# Patient Record
Sex: Female | Born: 1935
Health system: Southern US, Community
[De-identification: ages and names within clinical notes are randomized; demographics above are authoritative.]

## PROBLEM LIST (undated history)

## (undated) DIAGNOSIS — I2510 Atherosclerotic heart disease of native coronary artery without angina pectoris: Secondary | ICD-10-CM

## (undated) DIAGNOSIS — I10 Essential (primary) hypertension: Secondary | ICD-10-CM

## (undated) DIAGNOSIS — C801 Malignant (primary) neoplasm, unspecified: Secondary | ICD-10-CM

## (undated) DIAGNOSIS — N189 Chronic kidney disease, unspecified: Secondary | ICD-10-CM

## (undated) DIAGNOSIS — J449 Chronic obstructive pulmonary disease, unspecified: Secondary | ICD-10-CM

## (undated) DIAGNOSIS — G459 Transient cerebral ischemic attack, unspecified: Secondary | ICD-10-CM

## (undated) DIAGNOSIS — R0602 Shortness of breath: Secondary | ICD-10-CM

## (undated) DIAGNOSIS — I82409 Acute embolism and thrombosis of unspecified deep veins of unspecified lower extremity: Secondary | ICD-10-CM

## (undated) DIAGNOSIS — I739 Peripheral vascular disease, unspecified: Secondary | ICD-10-CM

## (undated) DIAGNOSIS — E785 Hyperlipidemia, unspecified: Secondary | ICD-10-CM

## (undated) DIAGNOSIS — J189 Pneumonia, unspecified organism: Secondary | ICD-10-CM

## (undated) DIAGNOSIS — D649 Anemia, unspecified: Secondary | ICD-10-CM

## (undated) DIAGNOSIS — M1990 Unspecified osteoarthritis, unspecified site: Secondary | ICD-10-CM

## (undated) DIAGNOSIS — E1151 Type 2 diabetes mellitus with diabetic peripheral angiopathy without gangrene: Secondary | ICD-10-CM

## (undated) DIAGNOSIS — F32A Depression, unspecified: Secondary | ICD-10-CM

## (undated) DIAGNOSIS — R0902 Hypoxemia: Secondary | ICD-10-CM

## (undated) DIAGNOSIS — H269 Unspecified cataract: Secondary | ICD-10-CM

## (undated) DIAGNOSIS — I509 Heart failure, unspecified: Secondary | ICD-10-CM

## (undated) DIAGNOSIS — K219 Gastro-esophageal reflux disease without esophagitis: Secondary | ICD-10-CM

## (undated) DIAGNOSIS — F329 Major depressive disorder, single episode, unspecified: Secondary | ICD-10-CM

## (undated) HISTORY — PX: BREAST SURGERY: SHX581

## (undated) HISTORY — PX: CARPAL TUNNEL RELEASE: SHX101

## (undated) HISTORY — DX: Essential (primary) hypertension: I10

## (undated) HISTORY — DX: Acute embolism and thrombosis of unspecified deep veins of unspecified lower extremity: I82.409

## (undated) HISTORY — PX: SPINE SURGERY: SHX786

## (undated) HISTORY — DX: Atherosclerotic heart disease of native coronary artery without angina pectoris: I25.10

## (undated) HISTORY — DX: Chronic obstructive pulmonary disease, unspecified: J44.9

## (undated) HISTORY — DX: Chronic kidney disease, unspecified: N18.9

## (undated) HISTORY — DX: Unspecified cataract: H26.9

## (undated) HISTORY — DX: Heart failure, unspecified: I50.9

## (undated) HISTORY — DX: Hyperlipidemia, unspecified: E78.5

## (undated) HISTORY — PX: CORONARY ARTERY BYPASS GRAFT: SHX141

## (undated) HISTORY — DX: Type 2 diabetes mellitus with diabetic peripheral angiopathy without gangrene: E11.51

## (undated) HISTORY — DX: Malignant (primary) neoplasm, unspecified: C80.1

## (undated) HISTORY — DX: Transient cerebral ischemic attack, unspecified: G45.9

## (undated) HISTORY — PX: EYE SURGERY: SHX253

---

## 1998-02-09 ENCOUNTER — Ambulatory Visit (HOSPITAL_COMMUNITY): Admission: RE | Admit: 1998-02-09 | Discharge: 1998-02-09 | Payer: Self-pay | Admitting: Family Medicine

## 1999-01-17 ENCOUNTER — Encounter: Payer: Self-pay | Admitting: Internal Medicine

## 1999-01-18 ENCOUNTER — Inpatient Hospital Stay (HOSPITAL_COMMUNITY): Admission: EM | Admit: 1999-01-18 | Discharge: 1999-01-21 | Payer: Self-pay | Admitting: Emergency Medicine

## 1999-01-18 ENCOUNTER — Encounter: Payer: Self-pay | Admitting: Family Medicine

## 1999-01-20 ENCOUNTER — Encounter: Payer: Self-pay | Admitting: Internal Medicine

## 1999-02-01 ENCOUNTER — Encounter: Admission: RE | Admit: 1999-02-01 | Discharge: 1999-05-02 | Payer: Self-pay | Admitting: Family Medicine

## 1999-02-16 ENCOUNTER — Inpatient Hospital Stay (HOSPITAL_COMMUNITY): Admission: RE | Admit: 1999-02-16 | Discharge: 1999-02-17 | Payer: Self-pay | Admitting: Thoracic Surgery

## 1999-02-16 ENCOUNTER — Encounter: Payer: Self-pay | Admitting: Vascular Surgery

## 1999-03-03 ENCOUNTER — Encounter: Payer: Self-pay | Admitting: Thoracic Surgery

## 1999-03-07 ENCOUNTER — Inpatient Hospital Stay (HOSPITAL_COMMUNITY): Admission: RE | Admit: 1999-03-07 | Discharge: 1999-03-14 | Payer: Self-pay | Admitting: Thoracic Surgery

## 2000-04-13 ENCOUNTER — Ambulatory Visit (HOSPITAL_COMMUNITY): Admission: RE | Admit: 2000-04-13 | Discharge: 2000-04-13 | Payer: Self-pay | Admitting: Cardiology

## 2000-06-04 ENCOUNTER — Encounter: Payer: Self-pay | Admitting: Cardiothoracic Surgery

## 2000-06-08 ENCOUNTER — Encounter: Payer: Self-pay | Admitting: Cardiothoracic Surgery

## 2000-06-08 ENCOUNTER — Inpatient Hospital Stay (HOSPITAL_COMMUNITY): Admission: RE | Admit: 2000-06-08 | Discharge: 2000-06-17 | Payer: Self-pay | Admitting: Cardiothoracic Surgery

## 2000-06-09 ENCOUNTER — Encounter: Payer: Self-pay | Admitting: Cardiothoracic Surgery

## 2000-06-10 ENCOUNTER — Encounter: Payer: Self-pay | Admitting: Thoracic Surgery (Cardiothoracic Vascular Surgery)

## 2000-07-17 ENCOUNTER — Encounter (HOSPITAL_COMMUNITY): Admission: RE | Admit: 2000-07-17 | Discharge: 2000-10-15 | Payer: Self-pay | Admitting: Cardiology

## 2000-08-20 ENCOUNTER — Encounter: Payer: Self-pay | Admitting: Family Medicine

## 2000-08-20 ENCOUNTER — Encounter: Admission: RE | Admit: 2000-08-20 | Discharge: 2000-08-20 | Payer: Self-pay | Admitting: Family Medicine

## 2000-12-25 ENCOUNTER — Encounter: Payer: Self-pay | Admitting: Orthopedic Surgery

## 2000-12-25 ENCOUNTER — Encounter: Admission: RE | Admit: 2000-12-25 | Discharge: 2000-12-25 | Payer: Self-pay | Admitting: Orthopedic Surgery

## 2000-12-26 ENCOUNTER — Ambulatory Visit (HOSPITAL_BASED_OUTPATIENT_CLINIC_OR_DEPARTMENT_OTHER): Admission: RE | Admit: 2000-12-26 | Discharge: 2000-12-26 | Payer: Self-pay | Admitting: Orthopedic Surgery

## 2001-01-10 ENCOUNTER — Encounter: Admission: RE | Admit: 2001-01-10 | Discharge: 2001-04-10 | Payer: Self-pay | Admitting: Orthopedic Surgery

## 2001-05-30 ENCOUNTER — Encounter: Admission: RE | Admit: 2001-05-30 | Discharge: 2001-05-30 | Payer: Self-pay | Admitting: Family Medicine

## 2001-05-30 ENCOUNTER — Encounter: Payer: Self-pay | Admitting: Family Medicine

## 2001-07-19 ENCOUNTER — Encounter: Payer: Self-pay | Admitting: Family Medicine

## 2001-07-19 ENCOUNTER — Encounter: Admission: RE | Admit: 2001-07-19 | Discharge: 2001-07-19 | Payer: Self-pay | Admitting: Family Medicine

## 2001-07-24 ENCOUNTER — Encounter: Payer: Self-pay | Admitting: Family Medicine

## 2001-07-24 ENCOUNTER — Encounter: Admission: RE | Admit: 2001-07-24 | Discharge: 2001-07-24 | Payer: Self-pay | Admitting: Family Medicine

## 2001-08-01 ENCOUNTER — Encounter: Payer: Self-pay | Admitting: General Surgery

## 2001-08-05 ENCOUNTER — Ambulatory Visit (HOSPITAL_COMMUNITY): Admission: RE | Admit: 2001-08-05 | Discharge: 2001-08-05 | Payer: Self-pay | Admitting: General Surgery

## 2001-08-05 ENCOUNTER — Encounter (INDEPENDENT_AMBULATORY_CARE_PROVIDER_SITE_OTHER): Payer: Self-pay | Admitting: *Deleted

## 2001-08-05 ENCOUNTER — Encounter: Admission: RE | Admit: 2001-08-05 | Discharge: 2001-08-05 | Payer: Self-pay | Admitting: General Surgery

## 2001-08-05 ENCOUNTER — Encounter: Payer: Self-pay | Admitting: General Surgery

## 2001-08-19 ENCOUNTER — Ambulatory Visit (HOSPITAL_BASED_OUTPATIENT_CLINIC_OR_DEPARTMENT_OTHER): Admission: RE | Admit: 2001-08-19 | Discharge: 2001-08-19 | Payer: Self-pay | Admitting: General Surgery

## 2001-08-19 ENCOUNTER — Encounter (INDEPENDENT_AMBULATORY_CARE_PROVIDER_SITE_OTHER): Payer: Self-pay | Admitting: *Deleted

## 2001-08-28 ENCOUNTER — Ambulatory Visit: Admission: RE | Admit: 2001-08-28 | Discharge: 2001-11-26 | Payer: Self-pay | Admitting: Radiation Oncology

## 2001-09-11 ENCOUNTER — Encounter: Admission: RE | Admit: 2001-09-11 | Discharge: 2001-09-11 | Payer: Self-pay | Admitting: General Surgery

## 2001-09-11 ENCOUNTER — Encounter: Payer: Self-pay | Admitting: General Surgery

## 2001-11-27 ENCOUNTER — Ambulatory Visit: Admission: RE | Admit: 2001-11-27 | Discharge: 2002-02-25 | Payer: Self-pay | Admitting: Radiation Oncology

## 2002-07-23 ENCOUNTER — Encounter: Payer: Self-pay | Admitting: General Surgery

## 2002-07-23 ENCOUNTER — Encounter: Admission: RE | Admit: 2002-07-23 | Discharge: 2002-07-23 | Payer: Self-pay | Admitting: General Surgery

## 2002-11-05 ENCOUNTER — Encounter: Admission: RE | Admit: 2002-11-05 | Discharge: 2002-11-05 | Payer: Self-pay | Admitting: Family Medicine

## 2002-11-05 ENCOUNTER — Encounter: Payer: Self-pay | Admitting: Family Medicine

## 2003-04-14 ENCOUNTER — Encounter: Payer: Self-pay | Admitting: General Surgery

## 2003-04-14 ENCOUNTER — Encounter: Admission: RE | Admit: 2003-04-14 | Discharge: 2003-04-14 | Payer: Self-pay | Admitting: General Surgery

## 2003-10-13 ENCOUNTER — Encounter: Admission: RE | Admit: 2003-10-13 | Discharge: 2003-10-13 | Payer: Self-pay | Admitting: General Surgery

## 2003-12-04 ENCOUNTER — Encounter: Admission: RE | Admit: 2003-12-04 | Discharge: 2003-12-04 | Payer: Self-pay | Admitting: Family Medicine

## 2003-12-13 ENCOUNTER — Emergency Department (HOSPITAL_COMMUNITY): Admission: EM | Admit: 2003-12-13 | Discharge: 2003-12-13 | Payer: Self-pay | Admitting: Emergency Medicine

## 2004-03-09 ENCOUNTER — Encounter (HOSPITAL_BASED_OUTPATIENT_CLINIC_OR_DEPARTMENT_OTHER): Admission: RE | Admit: 2004-03-09 | Discharge: 2004-04-01 | Payer: Self-pay | Admitting: Internal Medicine

## 2004-05-17 ENCOUNTER — Encounter: Admission: RE | Admit: 2004-05-17 | Discharge: 2004-05-17 | Payer: Self-pay | Admitting: General Surgery

## 2005-06-06 ENCOUNTER — Encounter: Admission: RE | Admit: 2005-06-06 | Discharge: 2005-06-06 | Payer: Self-pay | Admitting: Family Medicine

## 2005-07-13 ENCOUNTER — Encounter: Admission: RE | Admit: 2005-07-13 | Discharge: 2005-07-13 | Payer: Self-pay | Admitting: Cardiology

## 2005-08-28 ENCOUNTER — Encounter (HOSPITAL_COMMUNITY): Admission: RE | Admit: 2005-08-28 | Discharge: 2005-11-26 | Payer: Self-pay | Admitting: Nephrology

## 2005-08-29 ENCOUNTER — Encounter: Admission: RE | Admit: 2005-08-29 | Discharge: 2005-08-29 | Payer: Self-pay | Admitting: Nephrology

## 2005-10-09 ENCOUNTER — Ambulatory Visit (HOSPITAL_COMMUNITY): Admission: RE | Admit: 2005-10-09 | Discharge: 2005-10-09 | Payer: Self-pay | Admitting: Vascular Surgery

## 2005-11-23 ENCOUNTER — Inpatient Hospital Stay (HOSPITAL_COMMUNITY): Admission: RE | Admit: 2005-11-23 | Discharge: 2005-11-30 | Payer: Self-pay | Admitting: Thoracic Surgery

## 2005-11-23 ENCOUNTER — Encounter (INDEPENDENT_AMBULATORY_CARE_PROVIDER_SITE_OTHER): Payer: Self-pay | Admitting: Specialist

## 2005-12-04 ENCOUNTER — Inpatient Hospital Stay (HOSPITAL_COMMUNITY): Admission: EM | Admit: 2005-12-04 | Discharge: 2005-12-22 | Payer: Self-pay | Admitting: Emergency Medicine

## 2005-12-05 ENCOUNTER — Encounter (INDEPENDENT_AMBULATORY_CARE_PROVIDER_SITE_OTHER): Payer: Self-pay | Admitting: Cardiology

## 2005-12-13 ENCOUNTER — Ambulatory Visit: Payer: Self-pay | Admitting: Plastic Surgery

## 2005-12-15 ENCOUNTER — Ambulatory Visit: Payer: Self-pay | Admitting: Plastic Surgery

## 2005-12-27 ENCOUNTER — Encounter (HOSPITAL_COMMUNITY): Admission: RE | Admit: 2005-12-27 | Discharge: 2006-03-27 | Payer: Self-pay | Admitting: Nephrology

## 2006-04-03 ENCOUNTER — Encounter: Admission: RE | Admit: 2006-04-03 | Discharge: 2006-04-03 | Payer: Self-pay | Admitting: Family Medicine

## 2006-07-03 ENCOUNTER — Encounter: Admission: RE | Admit: 2006-07-03 | Discharge: 2006-07-03 | Payer: Self-pay | Admitting: General Surgery

## 2006-09-04 ENCOUNTER — Encounter: Admission: RE | Admit: 2006-09-04 | Discharge: 2006-09-04 | Payer: Self-pay | Admitting: Family Medicine

## 2007-02-28 ENCOUNTER — Ambulatory Visit: Payer: Self-pay | Admitting: Vascular Surgery

## 2007-04-01 ENCOUNTER — Encounter: Admission: RE | Admit: 2007-04-01 | Discharge: 2007-04-01 | Payer: Self-pay | Admitting: Family Medicine

## 2007-08-13 ENCOUNTER — Encounter: Admission: RE | Admit: 2007-08-13 | Discharge: 2007-08-13 | Payer: Self-pay | Admitting: Family Medicine

## 2007-09-05 ENCOUNTER — Ambulatory Visit: Payer: Self-pay | Admitting: *Deleted

## 2008-02-19 ENCOUNTER — Ambulatory Visit: Payer: Self-pay | Admitting: Vascular Surgery

## 2008-08-13 ENCOUNTER — Encounter: Admission: RE | Admit: 2008-08-13 | Discharge: 2008-08-13 | Payer: Self-pay | Admitting: Family Medicine

## 2008-08-18 ENCOUNTER — Ambulatory Visit: Payer: Self-pay | Admitting: Vascular Surgery

## 2009-03-09 ENCOUNTER — Ambulatory Visit: Payer: Self-pay | Admitting: Vascular Surgery

## 2009-06-30 ENCOUNTER — Encounter: Admission: RE | Admit: 2009-06-30 | Discharge: 2009-06-30 | Payer: Self-pay | Admitting: Family Medicine

## 2009-08-17 ENCOUNTER — Encounter: Admission: RE | Admit: 2009-08-17 | Discharge: 2009-08-17 | Payer: Self-pay | Admitting: Family Medicine

## 2009-09-02 ENCOUNTER — Ambulatory Visit: Payer: Self-pay | Admitting: Vascular Surgery

## 2010-02-17 ENCOUNTER — Ambulatory Visit: Payer: Self-pay | Admitting: Vascular Surgery

## 2010-08-19 ENCOUNTER — Encounter: Admission: RE | Admit: 2010-08-19 | Discharge: 2010-08-19 | Payer: Self-pay | Admitting: Family Medicine

## 2010-09-13 ENCOUNTER — Ambulatory Visit: Payer: Self-pay | Admitting: Vascular Surgery

## 2011-03-24 ENCOUNTER — Encounter (INDEPENDENT_AMBULATORY_CARE_PROVIDER_SITE_OTHER): Payer: Medicare Other

## 2011-03-24 DIAGNOSIS — I739 Peripheral vascular disease, unspecified: Secondary | ICD-10-CM

## 2011-03-24 DIAGNOSIS — Z48812 Encounter for surgical aftercare following surgery on the circulatory system: Secondary | ICD-10-CM

## 2011-03-28 NOTE — Procedures (Signed)
BYPASS GRAFT EVALUATION   INDICATION:  Follow up bilateral lower extremity bypass grafts.   HISTORY:  Diabetes:  Yes.  Cardiac:  CAD, CABG.  Hypertension:  Yes.  Smoking:  No.  Previous Surgery:  Right femoropopliteal bypass graft with Gore-Tex on  03/07/99.  Left femoropopliteal bypass graft with Gore-Tex on 11/23/05.  Both by Dr. Edwyna Shell.   SINGLE LEVEL ARTERIAL EXAM                               RIGHT              LEFT  Brachial:  Anterior tibial:  Posterior tibial:  Peroneal:  Ankle/brachial index:        Calcified          Calcified   PREVIOUS ABI:  Date: 02/19/08  RIGHT:  Calcified  LEFT:  Calcified   LOWER EXTREMITY BYPASS GRAFT DUPLEX EXAM:   DUPLEX:  1. Doppler arterial waveforms appear biphasic proximal to, within, and      distal to bilateral bypass grafts.  2. Stable elevated velocities proximally bilaterally.   IMPRESSION:  1. Patent bilateral femoropopliteal bypass grafts with stable known      elevated velocities proximally.  2. Ankle brachial indices not obtained due to previously documented      calcified vessels.   ___________________________________________  Di Kindle. Edilia Bo, M.D.   AS/MEDQ  D:  08/18/2008  T:  08/18/2008  Job:  [id number]

## 2011-03-28 NOTE — Assessment & Plan Note (Signed)
OFFICE VISIT   [name], [name]  DOB:  04/16/1927                                       02/17/2010  ZOXWR#:[id number]   Patient is an 77 year old woman who returns to clinic for evaluation of  her bypass grafts.  She underwent bilateral femoral popliteal bypass  grafts.  At this time, she returns to clinic without complaints.  Her  diabetes, dyslipidemia, and hypertension all remain stable.  She was  offered refills on her prescriptions; however, she stated that she did  not need refills at this time.   Physical findings revealed a well-nourished elderly woman who appeared  her stated age.  She was in no distress.  She did walk with a cane.  HEENT:  PERRLA, EOMI, normal conjunctiva.  Mucous membranes were pink  and moist.  Heart rate was 68, blood pressure 157/74, temperature was  100.  Lungs were clear bilaterally.  Cardiac exam revealed a regular  rate and rhythm.  The abdomen was soft, nontender.  There were no major  deformities of her musculoskeletal system.  Neurological exam  demonstrated no focal weaknesses or paresthesias.  Skin demonstrated no  ulcers or rashes.  I evaluated her feet.  There were no open sores on  either foot.   She states that she has been doing very well since her last visit.  She  is having no claudication symptoms.   LABORATORY WORK:  She underwent lower extremity bypass graft duplex  exam.  Both femoral and popliteal bypass grafts were patent, and no  focal stenoses were noted.  Bilateral ABIs were not obtainable due to  noncompressibility of the vessels.   Her evaluation has not changed appreciably over the past several months.  We will give her an appointment for a return visit with a P scan in 6  months for continuing followup.   Wilmon Arms, PA   Di Kindle. Edilia Bo, M.D.  Electronically Signed   KEL/MEDQ  D:  02/17/2010  T:  02/17/2010  Job:  [id number]

## 2011-03-28 NOTE — Procedures (Signed)
BYPASS GRAFT EVALUATION   INDICATION:  Followup, bilateral bypass grafts.   HISTORY:  Diabetes:  Yes, on insulin.  Cardiac:  CABG on 06/08/2000.  Hypertension:  Yes.  Smoking:  No.  Previous Surgery:  Right femoropopliteal artery bypass graft with Emeline Darling-  Tex on 03/07/1999, left femoropopliteal artery bypass graft with Emeline Darling-  [facility] on 11/23/2005, both by Dr. Edwyna Shell.   SINGLE LEVEL ARTERIAL EXAM                               RIGHT              LEFT  Brachial:  Anterior tibial:  Posterior tibial:  Peroneal:  Ankle/brachial index:   PREVIOUS ABI:  Date:  RIGHT:  LEFT:   LOWER EXTREMITY BYPASS GRAFT DUPLEX EXAM:   DUPLEX:  Biphasic proximal to, throughout, and distal to the grafts  bilaterally.  Velocities are within normal limits bilaterally.   IMPRESSION:  1. Patent bilateral to femoral popliteal artery bypass grafts.  2. Ankle brachial indices not obtained due to medial calcification.   ___________________________________________  Di Kindle. Edilia Bo, M.D.   DP/MEDQ  D:  09/05/2007  T:  09/06/2007  Job:  [id number]

## 2011-03-28 NOTE — Procedures (Signed)
BYPASS GRAFT EVALUATION   INDICATION:  Followup bilateral fem-pop bypass graft.   HISTORY:  Diabetes:  Yes.  Cardiac:  CABG and coronary artery disease.  Hypertension:  Yes.  Smoking:  No.  Previous Surgery:  Bilateral fem-pop bypass graft, right on 03/07/1999  and left on 11/23/2005 by Dr. Edwyna Shell.   SINGLE LEVEL ARTERIAL EXAM                               RIGHT              LEFT  Brachial:                                       160  Anterior tibial:             Monophasic         Monophasic  Posterior tibial:            Monophasic         Monophasic  Peroneal:  Ankle/brachial index:        Noncompressible    Noncompressible   PREVIOUS ABI:  Date:  RIGHT:  LEFT:   LOWER EXTREMITY BYPASS GRAFT DUPLEX EXAM:   DUPLEX:  Patent bilateral fem-pop bypass graft with no evidence of focal  stenosis.   IMPRESSION:  1. Patent bilateral femoral-popliteal bypass graft with no evidence of      focal stenosis.  2. Unable to obtain bilateral ankle brachial indices due to calcified      arteries.   ___________________________________________  Di Kindle. Edilia Bo, M.D.   MG/MEDQ  D:  09/02/2009  T:  09/03/2009  Job:  [id number]

## 2011-03-28 NOTE — Procedures (Signed)
BYPASS GRAFT EVALUATION   INDICATION:  Follow up bilateral lower extremity bypass grafts.   HISTORY:  Diabetes:  Yes.  Cardiac:  CAD, CABG.  Hypertension:  Yes.  Smoking:  No.  Previous Surgery:  Right femoral-to-popliteal artery bypass graft on  03/07/99 and left femoral-popliteal artery bypass graft on 11/23/05,  both by Dr. Edwyna Shell.   SINGLE LEVEL ARTERIAL EXAM                               RIGHT              LEFT  Brachial:                                       164  Anterior tibial:             Monophasic         Monophasic  Posterior tibial:            Monophasic         Monophasic  Peroneal:  Ankle/brachial index:        Not obtained       Not obtained   PREVIOUS ABI:  Date: 08/18/08  RIGHT:  Calcified  LEFT:  Calcified   LOWER EXTREMITY BYPASS GRAFT DUPLEX EXAM:   DUPLEX:  1. Bilateral Doppler arterial waveforms appear biphasic proximal to,      within, and distal to bypass grafts.  2. Stable elevated velocities proximally bilaterally.   IMPRESSION:  1. Patent bilateral femoral-popliteal artery bypass grafts with      stable, slightly elevated proximal velocities.  2. Ankle brachial indices are not obtained due to known calcified      vessels.  3. Bilateral ankle waveforms appear brisk monophasic.  4. Bilateral tibial brachial indices appear within normal limits at R      = 0.87, L = 0.85; however, may be over-estimated due to having to      use a thin toe cuff due to toe length.  5. No significant changes from previous study.   ___________________________________________  Di Kindle. Edilia Bo, M.D.   AS/MEDQ  D:  03/09/2009  T:  03/09/2009  Job:  [id number]

## 2011-03-28 NOTE — Procedures (Signed)
LOWER EXTREMITY ARTERIAL DUPLEX   INDICATION:  Follow up bilateral femoral-popliteal bypass grafts.   HISTORY:  Diabetes:  Yes.  Cardiac:  CABG and CAD.  Hypertension:  No.  Smoking:  No.  Previous Surgery:  Bilateral femoral-popliteal bypass grafts.  The right  was done 03/07/1999.  The left was done on 11/23/2005.   SINGLE LEVEL ARTERIAL EXAM                          RIGHT                LEFT  Brachial:               189  Anterior tibial:  Posterior tibial:  Peroneal:  Ankle/Brachial Index:   LOWER EXTREMITY ARTERIAL DUPLEX EXAM   TOE BRACHIAL INDEX RIGHT:  0.49   TOE BRACHIAL INDEX LEFT:  0.54   DUPLEX:  Patent bilateral femoral-to-popliteal bypass graft with no  evidence of stenosis.   IMPRESSION:  1. Stable toe brachial indices suggestive of moderate arterial      disease.  2. Patent bilateral femoral-popliteal bypass grafts with dampened      waveforms with no evidence of stenosis.   ___________________________________________  Di Kindle. Edilia Bo, M.D.   OD/MEDQ  D:  09/13/2010  T:  09/13/2010  Job:  [id number]

## 2011-03-28 NOTE — Procedures (Signed)
BYPASS GRAFT EVALUATION   INDICATION:  Follow up bilateral femoropopliteal bypass graft.   HISTORY:  Diabetes:  Yes.  Cardiac:  CABG and coronary artery disease.  Hypertension:  Yes.  Smoking:  No.  Previous Surgery:  Bilateral femoropopliteal bypass graft, right on  03/07/1999 and left on 11/23/2005 by Dr. Edwyna Shell.   SINGLE LEVEL ARTERIAL EXAM                               RIGHT              LEFT  Brachial:  Anterior tibial:             Monophasic         Monophasic  Posterior tibial:            Monophasic         Monophasic  Peroneal:  Ankle/brachial index:        Noncompressible    Noncompressible   PREVIOUS ABI:  Date: 09/02/09  RIGHT:  Noncompressible  LEFT:  Noncompressible   LOWER EXTREMITY BYPASS GRAFT DUPLEX EXAM:   DUPLEX:  Patent bilateral femoral to popliteal bypass graft with no  focal stenosis noted.   IMPRESSION:  1. Bilateral ankle brachial indices appear noncompressible due to      medial calcification.  2. Patent bilateral femoropopliteal bypass grafts with no stenosis      noted.   ___________________________________________  Di Kindle. Edilia Bo, M.D.   CB/MEDQ  D:  02/17/2010  T:  02/17/2010  Job:  [id number]

## 2011-03-28 NOTE — Procedures (Signed)
BYPASS GRAFT EVALUATION   INDICATION:  Followup bilateral bypass graft.   HISTORY:  Diabetes:  Yes, on insulin.  Cardiac:  Coronary artery disease, CABG on 06/08/2000.  Hypertension:  Yes.  Smoking:  No.  Previous Surgery:  Right femoral to popliteal artery bypass graft with  Gore-Tex on 03/07/1999, left femoral to popliteal bypass graft with Emeline Darling-  [facility] on 11/23/2005, both by Dr. Edwyna Shell.   SINGLE LEVEL ARTERIAL EXAM                               RIGHT              LEFT  Brachial:                    Calcified          Calcified  Anterior tibial:  Posterior tibial:  Peroneal:  Ankle/brachial index:   PREVIOUS ABI:  Date:  RIGHT:  Calcified  LEFT:  Calcified   LOWER EXTREMITY BYPASS GRAFT DUPLEX EXAM:   DUPLEX:  Doppler arterial waveforms are biphasic proximal to, throughout  and distal to the grafts bilaterally.   IMPRESSION:  1. Patent bilateral femoral popliteal artery bypass grafts.  2. ABIs not performed due to medial calcification.  3. Elevated velocities were noted in both proximal grafts.   ___________________________________________  Di Kindle. Edilia Bo, M.D.   DP/MEDQ  D:  02/19/2008  T:  02/19/2008  Job:  [id number]

## 2011-03-31 NOTE — Op Note (Signed)
[place]  Patient:    [name], [name] Visit Number: [id number] MRN: [id number]          Service Type: Attending:  Rose Phi. Maple Hudson, M.D. Dictated by:   Rose Phi. Maple Hudson, M.D. Proc. Date: 08/05/01   CC:         Darden Palmer., M.D.  Dyanne Carrel, M.D.   Operative Report  PREOPERATIVE DIAGNOSIS:  Carcinoma of the right breast.  POSTOPERATIVE DIAGNOSIS:  Carcinoma of the right breast.  OPERATION PERFORMED:  Blue dye injection with right axillary sentinel lymph node biopsy, right partial mastectomy with needle localization and specimen mammography.  SURGEON:  Rose Phi. Maple Hudson, M.D.  ANESTHESIA:  General.  DESCRIPTION OF PROCEDURE:  After suitable general endotracheal anesthesia was induced, the patient was placed in supine position with the right arm extended.  Prior to coming to the operating room 1 mCi technetium sulfur colloid had been injected intradermally in the periareolar area and she had had a wire localization of the malignant lesion at the 9 oclock position of the right breast.  We injected 4 cc of Lymphazurin blue and compressed the breast for five minutes and then prepped and draped her.  A radial incision around the previously placed wire centered at the 9 oclock position of the right breast was then made and a wide excision of the wire and the surrounding tissue was carried out and then the specimen oriented for the pathologist.  Specimen mammography was done which showed removal of the lesion.  Specimen then submitted to the pathologist for Touch Prep of the margins.  While that was being done, we scanned the axilla with a Neoprobe. It had a hot spot and I made a transverse axillary incision with dissection through the subcutaneous tissue to the clavipectoral fascia.  Following a blue lymphatic to a hot and blue lymph node, we excised that clipping the lymphatics.  This was then submitted to the pathologist  for Touch Prep.  There was no other palpable hot or blue nodes.  The Touch Prep on the margin showed some atypical cells at the 3 oclock margin so I excised some more tissue there.  The sentinel node was negative. Both incisions were stapled.  Dressings applied.  The patient was transferred to the recovery room in satisfactory having tolerated the procedure well. Dictated by:   Rose Phi. Maple Hudson, M.D. Attending:  Rose Phi. Maple Hudson, M.D. DD:  08/05/01 TD:  08/05/01 Job: [id number]

## 2011-03-31 NOTE — Op Note (Signed)
NAMEJADZIA, [name]                ACCOUNT NO.:  [id number]   MEDICAL RECORD NO.:  [id number]          PATIENT TYPE:  AMB   LOCATION:  SDS                          FACILITY:  [facility]   PHYSICIAN:  Di Kindle. Edilia Bo, M.D.DATE OF BIRTH:  05-Feb-1927   DATE OF PROCEDURE:  10/09/2005  DATE OF DISCHARGE:                                 OPERATIVE REPORT   PREOPERATIVE DIAGNOSIS:  Progressive claudication of the left lower  extremity.   POSTOPERATIVE DIAGNOSIS:  Progressive claudication of the left lower  extremity.   PROCEDURE:  1.  Aortogram.  2.  Bilateral iliac arteriogram.  3.  Bilateral lower extremity runoff.   SURGEON:  Di Kindle. Edilia Bo, M.D.   ANESTHESIA:  Local with sedation.   TECHNIQUE:  The patient was taken to the [facility] lab at [facility] and sedated  with 1 mg Versed and 50 mcg of fentanyl. Both groins were prepped and draped  in usual sterile fashion. The patient had a functioning right fem-pop bypass  graft; and I elected to cannulate the left common femoral artery. After the  skin was anesthetized, the left common femoral artery was cannulated and a  guidewire introduced into the infrarenal aorta under fluoroscopic control.  The 5-French sheath was introduced over the wire and the dilator was  removed. A pigtail catheter was positioned at the L-1 vertebral body and  flush aortogram obtained. The catheter was then repositioned above the  aortic bifurcation and an oblique iliac projection was obtained. The pigtail  catheter was then exchanged for an IMA catheter which was positioned into  the proximal right common iliac artery. I then advanced an angled Glidewire  down into the right common femoral artery; and then exchanged the IMA  catheter for an end-hole catheter which was positioned down in the distal  right common iliac artery. Right lower extremity runoff film was obtained.   Next, I removed the end-hole catheter.  There was a slight stenosis on the  left at the area where the common iliac bifurcated although there was really  only a minimal resting gradient of approximately 5 mmHg. The left lower  extremity films were obtained through the left femoral sheath.   FINDINGS:  There were single renal arteries bilaterally with no significant  renal artery stenosis identified. There is mild diffuse disease of the  infrarenal aorta with no focal stenosis identified. Both common iliac and  external iliac arteries are patent although there is some mild diffuse  disease of the iliac arteries. There was a slight stenosis on the left at  the level of the bifurcation of the iliac artery, however, this really did  not have any significant gradient at rest.   On the right side the common femoral artery is patent with some mild diffuse  disease. The deep femoral artery is patent. The superficial femoral artery  is occluded at its origin. There is a right femoral to above-knee popliteal  artery bypass graft which is widely patent without any evidence of stenosis  or problems within the graft.  There was some mild disease of the above-knee  popliteal artery. The popliteal artery was patent and then there is  essentially a single-vessel runoff via the anterior tibial artery, on the  right, which has some mild diffuse disease.   On the left side there is a stenosis in the distal common femoral artery.  The deep femoral artery is patent. There is severe diffuse disease  throughout the proximal superficial femoral artery; and a tight stenosis in  the distal superficial femoral artery on the left. The above-knee popliteal  artery and popliteal artery below-the-knee are patent. There is two-vessel  runoff on the left via the anterior tibial and posterior tibial arteries.  There is some mild diffuse disease throughout both of these vessels.   CONCLUSIONS:  1.  Patent right fem-pop bypass graft with native SFA occlusion on the right      and tibial  occlusive disease as described above.  2.  Superficial femoral artery occlusive disease on the left and tibial      occlusive disease as described above.      Di Kindle. Edilia Bo, M.D.  Electronically Signed     CSD/MEDQ  D:  10/09/2005  T:  10/09/2005  Job:  [id number]

## 2011-03-31 NOTE — Discharge Summary (Signed)
[name], [name]                ACCOUNT NO.:  [id number]   MEDICAL RECORD NO.:  [id number]          PATIENT TYPE:  INP   LOCATION:  2010                         FACILITY:  [facility]   PHYSICIAN:  Ines Bloomer, M.D. DATE OF BIRTH:  May 02, 1927   DATE OF ADMISSION:  12/04/2005  DATE OF DISCHARGE:  12/22/2005                                 DISCHARGE SUMMARY   PRIMARY ADMITTING DIAGNOSIS:  Shortness of breath.   ADDITIONAL/DISCHARGE DIAGNOSES:  1.  Acute exacerbation of congestive heart failure.  2.  Left groin wound dehiscence status post left femoral to popliteal      bypass.  3.  Coronary artery disease status post coronary artery bypass grafting.  4.  Type 2 diabetes mellitus.  5.  History of breast cancer.  6.  Hypertension.  7.  Chronic renal insufficiency.  8.  Chronic anemia,  9.  Gout.  10. Glaucoma.  11. Chronic obstructive pulmonary disease.  12. History of transient ischemic attacks.  13. Hyperlipidemia.  14. Escherichia coli urinary tract infection.   HISTORY:  The patient is a 77 year old black female who recently underwent a  left femoral popliteal bypass by Dr. Edwyna Shell on November 23, 2005. She was  discharged home in good condition on November 30, 2005. Over the course of  the days preceding this admission, she developed progressive shortness of  breath. She also developed some drainage from her left groin wound. Because  of this, she presented to the emergency department for further evaluation.  She was seen by the internal medicine service and was found to have a  evidence of an acute CHF exacerbation. Because of this, she was admitted for  further evaluation and treatment.   HOSPITAL COURSE:  She was started back on IV diuretics and was continued on  her home antihypertensive medications. She responded well to the diuresis.  Because of the drainage from her left groin wound as well as a fever of 101,  she was seen in consultation by Dr. Edwyna Shell for wound  check. Her left groin  wound was noted to be macerated and draining serosanguineous fluid. She had  evidence of mild wound dehiscence and because of this, her groin staples  were removed and she was started on wet to dry dressing changes. She was  also started on empiric antibiotics. She continued to run fevers and a  urinalysis and urine culture were performed which was positive for E-coli.  Wound cultures from her groin were also positive for E-coli. She initially  was covered with Zosyn and vancomycin, however, the sensitivities on both  her cultures revealed sensitivity to ceftriaxone and her antibiotic coverage  was switched appropriately. When she initially presented she was having  hypoglycemic episodes and her Amaryl was held and she was continued on  glyburide. However, throughout the course of her admission her blood sugars  began to trend back upward and she was restarted on all her home  medications. She was also started on a very low-dose Lantus at bedtime and  her sugars have been well-controlled. She has been anemic during this  admission  and required a transfusion of 2 units of packed red blood cells in  addition to her home doses of Aranesp and Niferex. Once her CHF exacerbation  appeared to be resolving, she was switched from IV back to p.o. Lasix. Once  she had stabilized from a medical standpoint, she was transferred to Dr.  Scheryl Darter service for further management of her wound. She was seen in  consultation by Dr. Odis Luster regarding placement of a VAC dressing. At the  time, initially her wound did not appear to be clean enough for placement of  a VAC.  She underwent bedside debridement of the wound and continued saline  wet to dry dressing changes. The wound did begin to improve and on December 19, 2005, she was seen by Pilar Grammes, the wound care nurse and a VAC  dressing was placed. Throughout her admission, her graft has remained patent  and she has a 3+ palpable  dorsalis pedis pulse. Her lower thigh incision on  the left has healed well. Her VAC dressing was changed on December 22, 2005,  and the wound is cleaning up very nicely. She has otherwise remained stable.  It is felt that since the wound is cleaning up well and she is otherwise  progressing and she may be discharged home with home health to follow. She  has remained afebrile since her initial admission and all vital signs are  stable.   Her most recent labs showed hemoglobin of 10.7, hematocrit 32.8, white count  6.1, platelets 316.  Sodium 136, potassium 4.3, BUN 29, creatinine 1.2.   DISCHARGE MEDICATIONS:  1.  Darvocet N 100 one to two q.4h. p.r.n. for pain.  2.  Clonidine 0.2 mg b.i.d.  3.  Imdur 90 mg daily.  4.  Allopurinol 100 mg daily.  5.  Colchicine 0.6 mg daily.  6.  Pepcid 20 mg b.i.d.  7.  Norvasc 10 mg daily.  8.  Aspirin 81 mg daily.  9.  Zetia 10 mg daily.  10. Timolol eye drops b.i.d.  11. Lasix 20 mg daily.  12. Nephro-Vite daily.  13. Niferex 150 mg b.i.d.  14. Epogen 10,000 units weekly.  15. Glyburide 6 mg daily.  16. Hydralazine 25 mg t.i.d.  17. Lopressor 12.5 mg daily.   DISCHARGE INSTRUCTIONS:  She is asked to refrain from driving, heavy lifting  or strenuous activity. She may continue to ambulate daily. She will continue  her same preoperative diet. Home health nurse has been arranged to assist  with VAC dressing changes Monday, Wednesday and Friday. Her other incision  may be cleaned daily with soap and water.   DISCHARGE FOLLOWUP:  She will see Dr. Edwyna Shell in the office on December 27, 2005, at 10:00 a.m.  She will also need to follow up with her primary care  physician in one to two weeks for recheck of her blood pressure and blood  sugars. She will contact our office in the interim if she experiences any  problems or has questions.      Coral Ceo, P.A.    ______________________________  Ines Bloomer, M.D.   GC/MEDQ  D:   12/22/2005  T:  12/22/2005  Job:  [id number]   cc:   Bryan Lemma. Manus Gunning, M.D.  Fax: [phone]   W. Viann Fish, M.D.  Fax: [phone]  Email: [email or web address] .Michaelle Birks, M.D.  Fax: [phone]

## 2011-03-31 NOTE — Op Note (Signed)
[place]  Patient:    [name], [name]                       MRN: [id number] Proc. Date: 06/08/00 Adm. Date:  [id number] Attending:  Waldo Laine CC:         Darden Palmer., M.D.                           Operative Report  PREOPERATIVE DIAGNOSIS:  Coronary occlusive disease.  POSTOPERATIVE DIAGNOSIS:  Coronary occlusive disease.  OPERATION:  Coronary artery bypass grafting times five with left internal mammary to the left anterior descending coronary artery, reverse saphenous vein graft to the posterior descending coronary artery, sequential reverse saphenous vein graft to the second obtuse marginal and distal circumflex, reverse saphenous vein graft to the first diagonal coronary artery.  SURGEON:  Gwenith Daily. Tyrone Sage, M.D.  FIRST ASSISTANT:  Carlye Grippe.  BRIEF HISTORY: The patient is a 77 year old female with known coronary occlusive disease having been catheterized in 1996.  She had been treated medically but recently had increasing symptoms of angina which were becoming debilitating.  Because of her persistent symptoms a repeat cardiac catheterization was done which demonstrated diffuse three vessel disease with poor distal vessels but with 70 to 80% stenosis in the proximal LAD, 90% stenosis in the first diagonal, a very small first obtuse marginal was diseased, a second obtuse marginal had 70 and 80% stenosis with distal disease. The most recent film compared to the film in 1996 showed a large distal circumflex branch which was totally occluded and not visible on the second film but obviously present on the first.  Overall ventricular function was preserved.  Because of the patients persistent symptoms, she was willing to proceed with bypass surgery though at increased risk because of poor distal disease and the patients overall medical condition with morbid obesity.  DESCRIPTION OF PROCEDURE:  With Swan-Ganz  and arterial line monitors in place, the patient underwent general endotracheal anesthesia without incident.  The skin of the chest and legs was prepped with Betadine and draped in the usual sterile manner.  Vein was harvested endoscopically from the left thigh. Incision was carried down slightly into the lower leg to obtain three segments of vein.  A median sternotomy was performed.  Left internal mammary artery was dissected down as a pedicle graft.  The distal artery was divided and had good free flow. Pericardium was opened.  Overall ventricular function appeared preserved.  The patient was systemically heparinized.  The ascending aorta and the right atrium were cannulated.  Aortic root bent cardioplegia needle was introduced into the ascending aorta.  The patient was placed on cardiopulmonary bypass at 2.4 liters per minute per sqm.  Sites of anastomosis were selected and dissected out of the epicardium.  The patients body temperature was cooled to 30 degrees.  Aortic cross clamp was applied, 500 cc of cold blood potassium cardioplegia was administered with rapid diastolic arrest of the heart.  Myocardial septal temperature was monitored throughout the procedure.  With the patient adequately arrested, first the posterior descending coronary artery was opened and was a small vessel, admitted a 1 mm probe. Using a running 7-0 Prolene distal anastomosis was performed with a segment of reversed saphenous vein graft.  Attention was then turned to the lateral wall, where the second obtuse marginal which obviously on the patients most recent  cardiac catheterization was opened. It was diffusely diseased but did admit a 1 mm probe distally.  The distal circumflex which was not obvious on the patients second film was endomyocardial and short distance from the second obtuse marginal.  This vessel was also opened.  Using a running 7-0 Prolene side to side anastomosis was performed to the  second obtuse marginal vein.  Distal segment of the same vein graft was then carried a short distance to the distal circumflex vessel which was open. It was slightly larger than the 1.5 mm probe.  Using a running 7-0 Prolene distal anastomosis was performed.  Attention was then turned to the first diagonal coronary artery which was open and was 1.5 mm in size.  Using a running 7-0 Prolene distal anastomosis was performed.  Attention was then turned to the left anterior descending coronary artery which was a diffusely diseased vessel but in the mid portion of the vessel was open and admitted 1.5 mm probe distally.  Using a running 8-0 Prolene the left internal mammary artery was anastomosed to the left anterior descending coronary artery.  With release of the Bulldog, there was prompt rise in myocardial septal temperature.  The aortic cross clamp was removed with total cross clamp time of 68 minutes. The patient spontaneously converted to a sinus rhythm. Partial occlusion clamp was placed on the ascending aorta.  Three punch aortotomies were performed.  Each of the three vein grafts were anastomosed to the ascending aorta.  Air was evacuated from the graft. Partial occlusion clamp was removed.  Sites of the anastomoses were inspected and were free of bleeding.  The patient was then ventilated and weaned from cardiopulmonary bypass without difficulty. She remained hemodynamically. She was decannulated in the usual fashion. Protamine sulfate was administered.  With the operative field hemostatic, two atrial and two ventricular pacing wires were left in place.  The pericardium was reapproximated.  The sternum was closed with #6 stainless wire.  A left pleural tube and two mediastinal tubes were left in place.  The sternum was closed with #6 stainless wire.  The fascia closed with interrupted 0 Vicryl and running 3-0 Vicryl in subcutaneous tissue, 4-0 subcuticular stitch in the skin edges.  Dry  dressings were applied.  Sponge and needle count was reported as correct at the completion of the procedure.  Total pump time was 125 minutes. The patient did require packed red blood cells because of low  hematocrit preoperatively. DD:  06/08/00 TD:  06/09/00 Job: [id number]

## 2011-03-31 NOTE — Discharge Summary (Signed)
[name], [name]                ACCOUNT NO.:  [id number]   MEDICAL RECORD NO.:  [id number]          PATIENT TYPE:  INP   LOCATION:  2001                         FACILITY:  [facility]   PHYSICIAN:  Ines Bloomer, M.D. DATE OF BIRTH:  August 15, 1927   DATE OF ADMISSION:  11/23/2005  DATE OF DISCHARGE:  11/30/2005                                 DISCHARGE SUMMARY   ADMISSION DIAGNOSIS:  Left leg pain secondary to peripheral vascular  disease.   DISCHARGE DIAGNOSES:  1.  Left leg pain secondary to peripheral vascular disease status post left      femoropopliteal bypass graft.  2.  Chronic renal insufficiency.  3.  Hypercholesterolemia.  4.  Hypertension.  5.  Hyperlipidemia.  6.  Diabetes mellitus.  7.  Obesity.  8.  Coronary artery disease.  9.  Gout.  10. Anemia.  11. History of transient ischemic attack.  12. Glaucoma.  13. Congestive heart failure.  14. Sarcoidosis.  15. Sleep apnea.   CONSULTS:  Dr. Eliott Nine was consulted on November 28, 2005.  The patient is  followed by Dr. Hyman Hopes in the office at [facility]   PROCEDURES:  Insertion of left femoropopliteal bypass above knee with 6 mm  Gore-Tex by Dr. Edwyna Shell on November 23, 2005.   HISTORY AND PHYSICAL EXAMINATION:  This is a 77 year old patient, who has a  long history of multiple vascular and coronary artery disease.  She has  diabetes mellitus, and hypertension.  The patient had a CABG done by Dr.  Tyrone Sage in 2001.  She also had a right femoropopliteal bypass graft done in  2000 by Dr. Edwyna Shell.  The patient now comes in with pain in her left leg.  An  arteriogram done by Dr. Durwin Nora showed that she had a patent right  femoropopliteal bypass graft.  She had superficial artery occlusive disease  and two-vessel disease on the left, and some stenosis of her distal common  femoral artery.  The femoral profunda is patent.  There is a tight stenosis  of the distal SFA artery just above the knee.  She is scheduled for a left  femoropopliteal bypass graft, Gore-Tex.   On initial exam, the patient's blood pressure was 140/80, pulse 60,  respirations 18, O2 sats 94%.  Heart:  Regular rate and rhythm.  Lungs:  Clear to auscultation bilaterally.  Extremities:  A right femoropopliteal  incision and venectomy incision.  Pulses are 2+ on the right and absent on  the left, but 2+ femoral.  The patient is alert and oriented x3.   HOSPITAL COURSE:  The patient underwent a left femoropopliteal bypass graft  on November 23, 2005.  On postop day #1, the patient was stable.  She had 2+  distal pulses.  Her labs were within normal limits.  The patient was  transferred to 2000 on November 26, 2005.  She was given heparin throughout  her stay.  The patient was ambulating with success.  PT was consulted  because the patient had difficulty pivoting to the chair.  PT also  recommended the patient have home health to  assist with her walking.  The  patient also had used [facility] in the past and wished to use it  again.  She did request for a rolling walker since her last bypass graft in  2000.  The patient did have an episode of gout on postop day #4 in her right  ankle and foot.  She was started on colchicine 0.6 mg p.o. every morning.  The patient was feverish on November 26, 2005 and was found to have a urinary  tract infection.  The patient was started on Cipro.  On November 28, 2005,  Renal was consulted due to the patient's history of chronic renal  insufficiency, and she is followed by Dr. Marland Mcalpine office.  ABIs were done on  November 28, 2005, but were not ascertained secondary to probable calcified  vessels.  Doppler wave forms were abnormal.   On discharge exam, the patient is afebrile.  Her heart is regular rate and  rhythm.  Lungs were clear to auscultation bilaterally.  Her left incisions  are clean, dry and intact.  The patient's bilateral extremities are warm and  well perfused.  Her labs were within normal limits.   Her creatinine is  somewhat elevated at 1.5.  It is being followed by Renal.  The patient is  ambulating much better.   DISCHARGE CONDITION:  Stable.   DISPOSITION:  The patient is being discharged to home with home health.  The  patient was instructed to follow a low-fat, low-salt, diabetic diet.  She is  to refrain from driving and lifting greater than 10 pounds for three weeks.  The patient is to increase activity slowly.  She is to walk three to four  times daily with her rolling walker.  She is instructed to continue her  breathing exercises.  The patient may shower and wash her incisions with  mild soap and water.  Home health is to remove her staples on December 07, 2005.  The patient was instructed to call the office if she experiences any  redness or drainage from the incision site.  She is also instructed to call  the office if she becomes febrile greater than 101.5.  The patient has an  appointment with Dr. Regino Schultze office in three weeks where she will have  ABIs.   DISCHARGE MEDICATIONS:  1.  Oxycodone 5 mg one to two tabs p.o. every six hours p.r.n.  2.  Toprol-XL 25 mg p.o. daily.  3.  Norvasc 10 mg p.o. daily.  4.  Glyburide 6 mg p.o. daily.  5.  Pepcid 20 mg one to two times daily p.r.n.  6.  Lasix 40 mg p.o. 1/2 tab daily.  7.  Colchicine 0.6 mg p.o. daily.  8.  Aspirin 81 mg p.o. daily.  9.  Hydralazine 50 mg p.o. t.i.d.  10. Allopurinol 100 mg p.o. daily.  11. Epogen 10,000 units q. weekly.  12. Zetia 10 mg p.o. daily.  13. Timolol one drop both eyes b.i.d.  14. Renal vitamin daily.      Constance Holster, PA    ______________________________  Ines Bloomer, M.D.    JMW/MEDQ  D:  11/29/2005  T:  11/29/2005  Job:  [id number]   cc:   Garnetta Buddy, M.D.  Fax: [phone]   W. Viann Fish, M.D.  Fax: [phone]  Email: [email or web address]   Dr. Gaylan Gerold

## 2011-03-31 NOTE — Op Note (Signed)
[place]  Patient:    LELA, MURFIN Visit Number: [id number] MRN: [id number]          Service Type: DSU Location: [facility] Attending Physician:  Janalyn Rouse Dictated by:   Rose Phi. Maple Hudson, M.D. Proc. Date: 08/19/01 Admit Date:  08/19/2001                             Operative Report  PREOPERATIVE DIAGNOSIS:  Carcinoma of the right breast, status post right partial mastectomy with positive margins.  POSTOPERATIVE DIAGNOSIS:  Carcinoma of the right breast, status post right partial mastectomy with positive margins.  OPERATION PERFORMED:  Re-excision of previous partial mastectomy site.  SURGEON:  Rose Phi. Maple Hudson, M.D.  ANESTHESIA:  General.  DESCRIPTION OF PROCEDURE:  After suitable general anesthesia was induced, the patient was placed in supine position with the right arm extended on the arm board.  The breast prepped and draped in the usual fashion.  A radial incision was centered at the 9 oclock position of the right breast.  It was then incised exposing the seroma cavity which was irrigated out after suctioning it dry.  Then using Allis clamps, I excised the whole biopsy site including the deep margin and then oriented it with sutures for the pathologist.  Hemostasis was obtained with the cautery.  We thoroughly irrigated the field with saline. Interrupted vertical mattress sutures of 4-0 nylon were used to close the skin.  Dressings were applied.  The patient was transferred to recovery room in satisfactory condition having tolerated the procedure well. Dictated by:   Rose Phi. Maple Hudson, M.D. Attending Physician:  Janalyn Rouse DD:  08/19/01 TD:  08/19/01 Job: [id number]

## 2011-03-31 NOTE — Op Note (Signed)
[name], [name]                ACCOUNT NO.:  [id number]   MEDICAL RECORD NO.:  [id number]          PATIENT TYPE:  INP   LOCATION:  2550                         FACILITY:  [facility]   PHYSICIAN:  Ines Bloomer, M.D. DATE OF BIRTH:  [birth year]   DATE OF PROCEDURE:  11/23/2005  DATE OF DISCHARGE:                                 OPERATIVE REPORT   PREOPERATIVE DIAGNOSIS:  Peripheral vascular disease, renal insufficiency.   POSTOPERATIVE DIAGNOSIS:  Peripheral vascular disease, renal insufficiency.   OPERATION PERFORMED:  Insertion of left femoral-popliteal bypass above-knee  with 6 mm Gore-Tex.   SURGEON:  Ines Bloomer, M.D.   ASSISTANTS:  1.  Pecola Leisure, PA  2.  Coral Ceo, P.A.   ANESTHESIA:  General.   DESCRIPTION OF PROCEDURE:  After general anesthesia, the patient was prepped  and draped in the usual sterile manner and the left leg was prepped.  A  transverse incision was made in the inguinal crease and dissection was  carried down through the subcuticular tissue and down to the femoral artery.  There was a marked amount of reaction around the femoral artery.  There was  a large branch that went anteriorly across the artery. This was dissected  out and the common femoral was dissected out and looped with a vascular  tape.  Dissecting distally, we dissected out the superficial femoral and the  profunda as well as one small posterior branch.  The patient had a really  large plaque in the artery and we were worried we may have to do an  endarterectomy.  After this had been exposed, an incision was made above the  knee on the lateral surface and dissection was carried down dividing the  superficial femoral artery that exited from the Hunter's canal.  It was  dissected out and __________ with a vascular tape.  Then a 6 mm Gore-Tex was  tunneled subsartorially from the distal incision to the groin incision.  The  patient was given 5000 units of heparin.  The distal  superficial artery was  clamped proximally and distally, opened longitudinally.  A 4 Fogarty  catheter passed down to about 25 cm with ease.  The arteriogram showed that  she had a lot of tibial disease but was open to the ankle.  The Gore-Tex  graft was cut tangentially and then sutured end-to-side with 6-0 Prolene in  running continuous fashion. All clamps were removed.  There was good back  bleeding through the Gore-Tex graft.  The Gore-Tex graft was clamped and  then measured appropriately to the femoral artery.  Then the common femoral  was clamped proximally with a baby Earl Lites and distally with Glover clamps  and then it was opened longitudinally and there was a large plaque right  there just proximal to the profunda and this was extended up for  approximately 2 to 3 cm.  We had to do an endarterectomy removing the plaque  with an elevator and doing a proximal version endarterectomy and a distal  direct vision endarterectomy in the superficial femoral.  After the plaque  had been  removed, the area was irrigated copiously.  All debris was removed.  Then the Gore-Tex graft was cut longitudinally and then sutured end-to-side  with 5-0 Prolene in a running continuous fashion.  All clamps were removed  and there was some leak at the toe of the graft and this was reinforced with  a horizontal mattress of 6-0 Prolene with felt.  After this had been done  one suture was placed to tack a leak laterally.  There was good flow  distally in the popliteal  artery by Doppler as well as palpation and then also in the dorsalis pedis  and posterior tibial.  Heparin was not reversed.  Wounds were closed with  interrupted 2-0 Vicryl in the muscle layer, 3-0 Vicryl in the subcutaneous  tissue and Ethicon skin clips.  The patient was returned to the recovery  room in stable condition.           ______________________________  Ines Bloomer, M.D.     DPB/MEDQ  D:  11/23/2005  T:  11/24/2005   Job:  [id number]

## 2011-03-31 NOTE — Consult Note (Signed)
NAMEMEDA, DUDZINSKI                ACCOUNT NO.:  [id number]   MEDICAL RECORD NO.:  [id number]          PATIENT TYPE:  INP   LOCATION:  3313                         FACILITY:  [facility]   PHYSICIAN:  Etter Sjogren, M.D.     DATE OF BIRTH:  [birth year]   DATE OF CONSULTATION:  12/13/2005  DATE OF DISCHARGE:                                   CONSULTATION   CHIEF COMPLAINT:  Left inguinal wound.   HISTORY OF PRESENT ILLNESS:  A 77 year old woman with multiple medical  problems including coronary artery disease, history of congestive heart  failure, diabetes type II, chronic renal failure and hypertension presented  with congestive heart failure on December 04, 2005.  She has undergone a left  femoral popliteal bypass graft with cortex 3 weeks ago.  She has developed a  wound infection, left groin, cultured E-coli.  The question is whether or  not to begin the vac at this point.   PHYSICAL EXAMINATION:  The wound left groin is not clean enough for the vac.  There is still some exudate in the base of the wound.  There is no  surrounding cellulitis.  I am not able to visualize any of the gortex in the  depth of the wound.   RECOMMENDATIONS:  Saline dressings q.i.d.  No vac at present.  We may not  want to use the vac at all given the fact that there is a gortex graft in  the base of the wound.  I will discuss this further with Dr. Edwyna Shell when the  appropriate time comes.  In the meantime we will use the saline dressing as  stated.      Etter Sjogren, M.D.  Electronically Signed     DB/MEDQ  D:  12/13/2005  T:  12/13/2005  Job:  [id number]   cc:   Ines Bloomer, M.D.  [address]  [place]  [place] [address]

## 2011-03-31 NOTE — H&P (Signed)
[name], [name]                ACCOUNT NO.:  [id number]   MEDICAL RECORD NO.:  [id number]          PATIENT TYPE:  INP   LOCATION:  NA                           FACILITY:  [facility]   PHYSICIAN:  Ines Bloomer, M.D. DATE OF BIRTH:  12/06/1926   DATE OF ADMISSION:  DATE OF DISCHARGE:                                HISTORY & PHYSICAL   PRESENT ILLNESS:  Right leg pain.   HISTORY OF PRESENT ILLNESS:  This 77 year old patient has a long history of  multiple vascular and coronary artery disease.  She also has diabetes  mellitus, hypertension.  She had a coronary artery bypass done by Dr.  Tyrone Sage in 2001.  She also had a right femoral popliteal bypass graft done  in 2000 by myself.  She now comes in with pain in her left leg.  An  arteriogram done by Dr. Edilia Bo showed that she has a patent right femoral  popliteal bypass graft.  She has superficial artery occlusive disease and  tibial disease on the left and some stenosis of her distal common femoral  artery.  The femoral profunda is patent.  There is a tight stenosis of the  distal SFA artery just above the knee.  She is scheduled for left femoral  popliteal bypass graft with Gore-Tex.  Other diagnosis includes chronic  renal insufficiency.   PRESENT MEDICATIONS:  1.  Darvocet for pain.  2.  Toprol 25 mg daily.  3.  Norvasc 10 mg daily.  4.  Glyburide 3 mg twice a day.  5.  Clonidine 0.2 mg daily.  6.  Amantadine 20 mg daily.  7.  Lisinopril 40 mg daily.  8.  Patanol one drop twice a day.  9.  Furosemide.  10. Colchicine p.r.n.  11. Arimidex 1 mg each day.  12. Aspirin 81 mg as needed.   She is allergic to CONTRAST DYE, CELEBREX, LAMISIL, VIOXX, CODEINE, and  SHELLFISH.   Past medical also includes glaucoma and some obstructive pulmonary disease.   FAMILY HISTORY:  Positive for diabetes, peripheral vascular disease,  hypertension, hypercholesterolemia.  She also has a history of transient  ischemic attacks in the past  and also family history is positive for kidney  disease.   SOCIAL HISTORY:  She is widowed, has six children.  She quit smoking 20  years ago.  Does not drink alcohol on a regular basis.   REVIEW OF SYSTEMS:  She is mildly obese African-American female.  Her weight  has been stable.  CARDIAC:  No recent angina or atrial arrhythmias, history  of coronary artery bypass.  PULMONARY:  Some dyspnea with exertion.  No  hemoptysis, recent pneumonia, or fever, chills.  GI:  No nausea, vomiting,  constipation, or diarrhea.  Does have reflux.  GU:  Chronic renal  insufficiency.  VASCULAR:  See history of present illness and past medical  history.  NEUROLOGIC:  No headaches, blackout, or seizures.  ORTHOPEDIC:  Some chronic joint pain.  No rashes.  SKIN:  Without lesion.  PSYCHIATRIC:  No psychiatric illnesses.  HEENT:  Eyes/ENT:  Glaucoma.   PHYSICAL  EXAMINATION:  VITAL SIGNS:  Blood pressure 140/80, pulse 60,  respirations 18, saturations 94%.  HEENT:  Head is atraumatic.  Eyes:  Pupils are equal, round, and reactive to  light and accommodation.  Extraocular movements were normal.  Ears:  Tympanic membranes were intact.  Nose:  There is no septal deviation.  Mouth  without lesion.  NECK:  Supple.  There is no thyromegaly.  No supraclavicular or axillary  adenopathy.  CHEST:  There is a median sternotomy incision.  HEART:  Regular sinus rhythm.  LUNGS:  Clear bilaterally.  ABDOMEN:  Obese.  There is no hepatosplenomegaly.  EXTREMITIES:  There is a right femoral popliteal incision and venectomy  incisions.  Pulses are 2+ on the right and absent on the left pulses, but 2+  femoral.  NEUROLOGIC:  She is oriented x3.  Sensory and motor intact.  SKIN:  Without lesions.   IMPRESSION:  1.  Left leg pain secondary to peripheral vascular disease.  2.  Coronary artery disease.  3.  Hypertension.  4.  Diabetes mellitus.  5.  Glaucoma.  6.  Status post coronary artery bypass.  7.  Status post  right femoral popliteal bypass.  8.  Chronic renal insufficiency.   PLAN:  Left femoral popliteal bypass.           ______________________________  Ines Bloomer, M.D.     DPB/MEDQ  D:  11/22/2005  T:  11/22/2005  Job:  [id number]

## 2011-03-31 NOTE — Consult Note (Signed)
NAMEJENTRY, [name]                ACCOUNT NO.:  [id number]   MEDICAL RECORD NO.:  [id number]          PATIENT TYPE:  INP   LOCATION:  2001                         FACILITY:  [facility]   PHYSICIAN:  Aram Beecham B. Eliott Nine, M.D.DATE OF BIRTH:  [birth year]   DATE OF CONSULTATION:  11/28/2005  DATE OF DISCHARGE:                                   CONSULTATION   [name] is a very nice 77 year old black female who has diabetes,  hypertension, coronary artery disease and peripheral vascular disease. She  is followed by Dr. Hyman Hopes in our office at [facility] [address]  Her primary care  physician is Dr. Gaylan Gerold and her cardiologist is Dr. Resa Miner.  She was admitted  on November 23, 2005 by Dr. Edwyna Shell for a left fem-pop bypass graft.   She has a prior history of acute renal failure felt secondary to ACE  inhibitor and nonsteroidal therapy. She has baseline chronic kidney disease  but off the ACE inhibitor and nonsteroidal.  Her baseline creatinine has  most recently been 1.1-1.2 and in fact was 1.1 when last checked in our  office on September 21, 2005.  She has had a workup in the past which included  an MRA of the renal arteries which was negative for renal artery stenosis,  negative SPEP and UPEP and she has smallish 9.4 and 9.3 cm kidneys  bilaterally with some cortical atrophy.   Her serum creatinine on admission was 1.2 and it has remained in the range  of 1.2-1.5 since that time.   Her hospital course has been fairly uncomplicated. She has had some fevers  with pyuria, but with a negative urine culture (however, this culture may  have been affected by the administration of preoperative antibiotics).   She has also had a hemoglobin variation from 12.3 to around 8.9 since  admission. Of note she does receive a weekly Procrit injections (10,000  units once a week) under the direction of Dr. Hyman Hopes.  Her hemoglobin as an  outpatient in our office was also around 12.   Because of some blood pressures on  the low side, she has had some reduction  in her antihypertensive medications since she has been here and blood  pressures are currently well controlled.   PAST MEDICAL HISTORY:  1.  Longstanding hypertension.  2.  At least 20 years of type 2 diabetes.  3.  Coronary artery disease with a prior CABG.  4.  History of a fem-pop bypass on the right in 2000.  5.  Left fem-pop done this admission on November 23, 2005.  6.  Hyperlipidemia.  7.  Carpal tunnel syndrome.  8.  Glaucoma.  9.  Obesity.  10. Gout.  11. Arthritis.  12. History of back surgeries.  13. History of bilateral cataracts.  14. Remote tobacco use although she is not smoking now.  15. There is no history of alcohol.   CURRENT MEDICATIONS:  1.  Allopurinol 100 milligrams a day.  2.  Aspirin 81 milligrams a day.  3.  Cipro 500 milligrams b.i.d.  4.  Catapres 0.1 milligrams b.i.d.  5.  Docusate 100 milligrams a day.  6.  Zetia 10 milligrams a day.  7.  Glyburide 6 milligrams a day.  8.  Hydralazine 25 milligrams t.i.d.  9.  Indocin 50 milligrams b.i.d. started on 11/27/2005.  10. Metoprolol 25 milligrams a day.  11. Nephro-Vite once a day.  12. Timolol eye drops.  13. Nephropexy 150 once a day.  14. Colchicine 0.6 milligrams a day.  15. She also has p.r.n. Tylenol, Dulcolax, Benadryl, tramadol, Darvocet,      Phenergan, oxycodone, Zofran and morphine.   HER OUTPATIENT MEDICATIONS:  Also included:  1.  Lasix 20 milligrams a day which has been on hold.  2.  Pepcid 20 milligrams b.i.d.  3.  Norvasc 10 milligrams a day which has been D/C'd this admission.  4.  Imdur 60 which I do not believe she has been receiving this admission.  5.  Procrit 10,000 units once a week.   The patient says she has a rash to IODINE, CELEBREX AND VIOXX and is  intolerant to CODEINE.   FAMILY HISTORY:  Positive in that she had a daughter, Dimas Aguas Means who had  end-stage renal disease and was on dialysis at the time of her death.   Family history is also positive for peripheral vascular disease.   SOCIAL HISTORY:  The patient lives alone although her granddaughters check  in on her frequently. As previously mentioned, she has a remote tobacco  history but no alcohol history. She has four living children. One daughter  died of pancreatic cancer and one died with renal failure of other  complications.  She had two sons, one was stillborn and one miscarried.   REVIEW OF SYSTEMS:  Positive for pain especially in the groin and leg in the  right ankle. She feels puffy having not received her diuretics. She also  describes some weakness and some difficulty getting around.  She says she  has no chest pain, shortness of breath, nausea or vomiting. She was  constipated but has had a bowel movement since taking a laxative.   PHYSICAL EXAM:  She is a very pleasant and delightful, older black woman in  no distress. Granddaughter's are with her.  On physical exam she has no JVD.  Her lung fields were clear to auscultation. She has a well-healed median  sternotomy scar. Cardiac exam S1-S2 no S3. Abdomen is obese. She has a groin  dressing in place in the left groin and there are left leg staples which are  intact. The incision itself looks clean and dry. She has scars on her left  leg from prior vein graft harvest sites for her bypass surgery and has scars  from her right prior fem-pop bypass. There is 1 to 2+ edema of the left leg  and trace to 1+ on the right.   LABORATORIES:  Sodium 129, potassium 3.7, chloride 101, CO2 22, BUN 32,  creatinine 1.5, calcium 8.1, hemoglobin 8.7 (preoperatively 13.1), WBC 6500,  uric acid is 6.9. Urine culture was negative. Chest x-ray 11/27/2005 was  negative.   IMPRESSION:  77 year old black female with diabetes, hypertension and  peripheral vascular disease with  1.  CKD with prior history of worsening renal function in the setting of     nonsteroidals and ACE inhibitors. Creatinine is up a  bit over the course      of this admission although has ranged as an outpatient between 1.1 and      1.6 and is currently 1.5. I would recommend  discontinuation of      indomethacin as she has had previous renal insufficiency issues related      to the use of nonsteroidal anti-inflammatory drugs.  2.  Anemia. Hemoglobin is down about 4 grams since admission. She has been      on outpatient Procrit at 10,000 units a week. We will give her the      equivalent in the form of Aranesp of about 30,000 units while she is      here.  Check iron and TIBC to see if she needs parenteral iron dextran      replacement and then she can resume her usual dosing of Procrit at the      time of discharge.  3.  Diabetes.  4.  Hypertension - blood pressure is normal on a reduced medicine regimen.  5.  PDD status post fem-pop bypass per Dr. Edwyna Shell.  6.  Gout - uric acid on her current allopurinol dose is normal. Her      colchicine has been restarted.  7.  Question UTI - culture and sensitivity were negative but possibly      affected by preop antibiotics. Cipro could probably be discontinued.  8.  Other problems per the primary service.  9.  Thanks for asking [place] to see her. Will follow along with you.           ______________________________  Duke Salvia. Eliott Nine, M.D.     CBD/MEDQ  D:  11/28/2005  T:  11/28/2005  Job:  [id number]

## 2011-03-31 NOTE — Op Note (Signed)
[place]  Patient:    [name], [name]                       MRN: [id number] Proc. Date: 12/26/00 Adm. Date:  [id number] Disc. Date: [id number] Attending:  Milly Jakob                           Operative Report  PREOPERATIVE DIAGNOSIS:  Carpal tunnel syndrome, left.  POSTOPERATIVE DIAGNOSIS:  Carpal tunnel syndrome, left.  OPERATION PERFORMED:  Left carpal tunnel release.  SURGEON:  Harvie Junior, M.D.  ASSISTANT:  Currie Paris. Thedore Mins.  ANESTHESIA:  Bier block.  INDICATIONS FOR PROCEDURE:  She is a 77 year old female with a long history of carpal tunnel syndrome.  She has numbness and tingling and clumsiness with the hands.  She has failed all conservative treatment.  Because of continued complaints of numbness and tingling, she is brought to the operating room for carpal tunnel release.  DESCRIPTION OF PROCEDURE:  The patient was taken to the operating room and after adequate anesthesia was obtained with a forearm-based IV regional, the patient was placed supine on the operating table.  The left arm was prepped and draped in the usual sterile fashion.  Following this, a curvilinear incision was made just ulnar to the midline crease.  Subcutaneous tissues were dissected down to the level of the volar carpal ligament which was identified clearly.  A stab  wound was made in the central portion of the ligament, care being taken not to injure the underlying structures.  Scissor was used to make sure that the nerve was freed from the undersurface and the ligament was cut both proximally.  A gloved finger could be put in the wound proximally and distally.  At this point the wound was copiously irrigated and suctioned dry. The skin was closed with a combination of interrupted and running suture.  A sterile compressive dressing as well as a volar plaster splint was applied. The patient was taken to the recovery where she was noted to be  in satisfactory condition.  Estimated blood loss for this procedure was none. DD:  01/08/01 TD:  01/08/01 Job: [id number]

## 2011-03-31 NOTE — Cardiovascular Report (Signed)
[place]. [facility]  Patient:    [name], SUNDBERG                       MRN: [id number] Proc. Date: 04/13/00 Adm. Date:  [id number] Disc. Date: [id number] Attending:  Norman Clay CC:         Jaci Lazier., M.D.             Dyanne Carrel, M.D.                        Cardiac Catheterization  HISTORY:  A 77 year old diabetic, who has known coronary artery disease.  She presented with worsening dyspnea on exertion and angina.  A Cardiolite scan was abnormal, with multiple areas of ischemia noted.  COMMENTS ABOUT PROCEDURE:  Because of previous peripheral vascular disease, the procedure was done through the left femoral artery without complications. Following the procedure, good hemostasis and pedal pulses were present.  HEMODYNAMIC DATA: 1. Aortic Pressure:  (post-contrast) 148/61. 2. Left ventricular pressure:  (post-contrast) 148/19.  ANGIOGRAPHIC DATA:  LEFT VENTRICULOGRAM:  Performed in the 30-degree RAO projection.  Aortic valve is normal.  The mitral valve is normal.  The left ventricle is normal in size. The ejection fraction is estimated at 60%.  CORONARY ANGIOGRAPHY:  Coronary arteries arise and distribute normally.  The right coronary artery is very heavily calcified.  There is calcification seen in the left coronary artery also. 1. LEFT MAIN CORONARY ARTERY:  Calcified, with mild, diffuse narrowing and    distal narrowing of 30%. 2. LEFT ANTERIOR DESCENDING ARTERY:  Calcified, with moderate proximal    disease.  There is a moderately severe 70-80% stenosis involving the    proximal LAD and the bifurcation of a large diagonal branch; which appears    to have an 80% ostial narrowing also.  The distal vessel is mildly    diseased. 3. CIRCUMFLEX:  The circumflex intermediate branch has a severe segmental 95%    proximal stenosis.  The circumflex itself has a proximal 60-70% stenosis    which is calcified, and then is  diffusely diseased in its distal marginal.    There are multiple areas of severe narrowing involving the mid and distal    portions of this vessel. 4. RIGHT CORONARY ARTERY:  Heavily calcified. There is a proximal 70% stenosis    noted with severe calcification.  A posterior descending branch    is diffusely diseased.  There are several posterolateral branches which    are somewhat small and diffusely diseased.  IMPRESSION: 1. Severe three-vessel coronary artery disease. 2. Normal left ventricular function.  RECOMMENDATIONS:  Consideration of coronary bypass grafting; although distal targets are somewhat poor.     DD:  04/13/00 TD:  04/16/00 Job: [id number]

## 2011-03-31 NOTE — Consult Note (Signed)
[name], DETTMER                          ACCOUNT NO.:  [id number]   MEDICAL RECORD NO.:  [id number]                   PATIENT TYPE:  REC   LOCATION:  FOOT                                 FACILITY[facility]  [facility]   PHYSICIAN:  Jonelle Sports. Sevier, M.D.              DATE OF BIRTH:  15-Jul-1927   DATE OF CONSULTATION:  DATE OF DISCHARGE:                                   CONSULTATION   HISTORY:  This 77 year old black female was seen at the courtesy of Dr.  Gaylan Gerold for interdigital infection and ulceration involving both feet.  The  patient has type 2 diabetes which has been present for some 22 or 23 years  but is in good control with A1C of 6.6.  In addition she has history of  sarcoid and peripheral vascular disease status post femoral-popliteal in the  right lower extremity.  She has had severe hallux valgus almost since birth  and also is said to have gout which occasionally effects particularly the  second toe of the left foot.   With that background history, the patient has had interdigital infection  with fissurization for approximately one year involving the 1-2, 2-3, and 3-  4 interspaces on the left and the 1-2 interspace on the right.  At the  advice of a podiatrist she has been soaking her feet in Epsom salts,  applying a purple ointment which presumably contains some gentian violet on  a daily basis.  She has had no improvement and, in fact, there has been some  recent new ulceration in the 1-2 interspace on the right and she is  accordingly referred here for our evaluation and advice.   PAST MEDICAL HISTORY:  Past medical history notable for those things  mentioned above as well as hypertension, hyperlipidemia, coronary artery  disease with history of bypass grafting, treated breast cancer, degenerative  arthritis, and a TIA.   ALLERGIES:  She is said to be ALLERGIC TO CELEBREX and AVANDIA.   MEDICATIONS:  Her regular medications include Arimidex, baby aspirin,  clonidine,  colchicine, Glyburide, NPH and Lantus insulin, Lasix, Lipitor,  lisinopril, Mobic, Norvasc, and Toprol XL.   PHYSICAL EXAMINATION:  Examination today is limited to the distal lower  extremities.  The patient's feet are somewhat deformed with fairly  significant pes planus on the right and with bilateral hallux valgus, worse  on the left than on the right with actual overriding of the second toe on  the hallux on the left side.  Skin temperatures are equal and symmetrical.  Pulses are everywhere palpable.  Monofilament testing shows that she has  protective sensation throughout.  There is a tiny excrescence on the bunion  of the first MP joint on the left foot but with no spreading inflammation.  The skin of the feet is quite dry and particularly on the area of the heels.  There is maceration and evidence of fungal infection  in the interdigital  spaces 1-2, 2-3, and 3-4 on the left with some extension of this onto the  plantar aspect of the foot and with a fissure at the base of the second toe  there.  On the right foot there is similar maceration with superficial  ulceration approximately 1 x 1 cm in the interdigital space there.  There is  no evidence of spreading cellulitis.   DISPOSITION:  1. The patient was given instruction regarding foot care and diabetes by     video with nurse and physician reinforcement.  She has been in the habit     of soaking her feet daily in Epsom salts and specifically it was pointed     out to her that we prefer not to do this in patients with diabetes.  2. The loose skin overlying the ulcer in the first and second interspace of     the right foot is sharply debrided away without incident.  3. The affected interdigital areas are treated here with application of     Lamisil and lambs wool placement.  4. The patient was instructed to wash her feet once daily with warm soapy     water using Dial soap, to rinse them well, and to dry cautiously between      the toes.  She is to apply Lamisil and lambs wool as instructed here     following such cleansing.  In addition, on the second time each day,     preferably at bedtime, the patient is again to make an application of     Lamisil with lambs wool.  5. The patient is to continue in a healing sandal which she has been wearing     on the left and to continue in her _____________ shoe which she has been     wearing on the right.  6. Followup visit here will be in two weeks.                                               Jonelle Sports. Cheryll Cockayne, M.D.    RES/MEDQ  D:  03/10/2004  T:  03/10/2004  Job:  [id number]   cc:   Suzzette Righter

## 2011-03-31 NOTE — H&P (Signed)
NAME:  [name], [name]                ACCOUNT NO.:  [id number]   MEDICAL RECORD NO.:  [id number]          PATIENT TYPE:  INP   LOCATION:  4741                         FACILITY:  [facility]   PHYSICIAN:  Corinna L. Lendell Caprice, MDDATE OF BIRTH:  [birth year]   DATE OF ADMISSION:  12/04/2005  DATE OF DISCHARGE:                                HISTORY & PHYSICAL   CHIEF COMPLAINT:  Can't lie down flat and shortness of breath.   HISTORY OF PRESENT ILLNESS:  [name] is a pleasant 77 year old black  female patient of Dr. Manus Gunning who presents to the emergency room via EMS  with shortness of breath and orthopnea.  She was just discharged from the  hospital on November 30, 2005 at which time she had had a left leg fem-pop  bypass by Dr. Edwyna Shell.  This was done on January 11.  According to the  daughter who is a progression nurse here at [facility] many of her  medications including Lasix had been held during her hospitalization.  Also,  some of her blood pressure medicines were either stopped or decreased due to  hypotension and renal insufficiency.  She has been back on Lasix for the  past few days.  The patient reports that she started having shortness of  breath on Saturday which is two days ago and it has become progressive.  Last night was quite bad and she was unable to lie down flat.  She denies  leg swelling.  She denies chest pain.  She has no history of PE.  Her  cardiologist is Dr. Donnie Aho and apparently she saw him two weeks ago  preoperatively.  She was supposed to follow up with Dr. Edwyna Shell in a few days  and her granddaughter reports that her left groin wound has been draining  some serosanguineous fluid.  Also, patient has been having several  hypoglycemic episodes into the 40s and her appetite has not been great.   PAST MEDICAL HISTORY:  1.  History of congestive heart failure.  2.  Coronary artery disease with coronary artery bypass grafts.  Cardiac      catheterization in 2001 revealed  a preserved ejection fraction.  3.  Diabetes type 2.  4.  History of breast cancer.  5.  Hypertension.  6.  Chronic renal failure followed by Dr. Elvis Coil.  7.  Renal anemia.  8.  History of gout.  9.  History of glaucoma.  10. History of COPD.  11. History of transient ischemic attacks.  12. Hyperlipidemia.   MEDICATIONS:  1.  Currently she is on Imdur 90 mg p.o. daily.  2.  Her clonidine had recently been decreased to 0.1 mg p.o. b.i.d.  It had      been 0.2 mg p.o. b.i.d.  3.  Toprol XL 25 mg a day.  4.  Norvasc 10 mg a day.  5.  Pepcid 20 mg p.o. b.i.d.  6.  Colchicine 0.6 mg p.o. daily.  7.  Her hydralazine was 75 mg p.o. t.i.d. and it had been decreased to 25 mg      p.o.  t.i.d. postoperatively.  8.  Epogen 10,000 units subcutaneous every Monday.  9.  Allopurinol 100 mg p.o. daily.  10. Lasix 20 mg a day.  11. Zetia 10 mg a day.  12. Aspirin 81 mg a day.  13. Timolol eye drops one drop to both eyes b.i.d.   SOCIAL HISTORY:  Patient does not drink.  She lives alone but apparently  several of her family members have been staying with her postoperatively.  She uses a walker recently after the surgery.  She has a previous tobacco  history.  She reports that she would not want to be resuscitated.   FAMILY HISTORY:  Noncontributory.   REVIEW OF SYSTEMS:  CONSTITUTIONAL:  She has been having sweats with her  hypoglycemic episodes.  Otherwise, no change in weight and no fevers.  HEENT:  No headache.  No sore throat.  RESPIRATORY:  As above.  She has also  had a cough productive of clear sputum but she implies that this may be  chronic.  CARDIOVASCULAR:  No chest pains.  GI:  Her appetite has been  erratic.  Her granddaughter reports that this is a chronic issue for her.  She denies nausea or diarrhea.  GU:  No dysuria.  MUSCULOSKELETAL:  No  arthralgias or myalgias.  SKIN:  No rash.  PSYCHIATRIC:  No depression.  NEUROLOGIC:  No history of seizures or focal weakness  recently.  HEMATOLOGIC:  No history of thromboembolism.   PHYSICAL EXAMINATION:  VITAL SIGNS:  Temperature 99.4, blood pressure  159/56, pulse 56, respiratory rate 20, oxygen saturation 98% on room air.  GENERAL:  Patient is an obese black female in no acute distress.  HEENT:  Normocephalic, atraumatic.  Pupils are equal, round, and reactive to  light.  Sclerae non-icteric.  Moist mucous membranes.  NECK:  Supple.  No JVD.  LUNGS:  Clear to auscultation bilaterally without wheezes, rhonchi, or  rales.  CARDIOVASCULAR:  Regular rate and rhythm without murmurs, rubs, or gallops.  ABDOMEN:  Obese, soft, nontender.  GENITOURINARY:  Deferred.  RECTAL:  Deferred.  EXTREMITIES:  She has an incision over her left groin which is not currently  draining, but has some 4x4s which show some serosanguineous drainage and no  purulence.  Her left leg wound looks clean and has no drainage.  She has 1+  pitting edema bilaterally.  No calf tenderness.  Homan's sign negative.  SKIN:  No rash.  PSYCHIATRIC:  Normal affect.  NEUROLOGIC:  Alert and oriented.  Cranial nerves and sensory motor  examination are grossly intact.   LABORATORIES:  On room air her pH is 7.396, pCO2 is 35, pO2 is 107,  bicarbonate 22, base deficit 3, oxygen saturation 98%.  CBC is significant  for a white blood cell count of 10,000, hemoglobin 9.2, hematocrit 27.9, MCV  of 73, platelet count 408 with 90% neutrophils.  INR is 1.2.  Complete  metabolic panel is significant for a glucose of 58.  Her BUN is 24 and her  creatinine is 1.3.  Her creatinine on January 18 was 1.8 and her BUN was 44.  LFTs are significant for alkaline phosphatase of 174, otherwise  unremarkable.  CPK-MB and troponin are normal.  B-type natriuretic peptide  is 431.  EKG shows sinus bradycardia with right bundle branch block which is  old.  Chest x-ray shows mild CHF.   ASSESSMENT/PLAN: 1.  Dyspnea secondary to congestive heart failure exacerbation.  I  suspect      this is  due to her recent surgery and withholding her medications.  I      will give intravenous Lasix and resume her medications at preoperative      doses.  Apparently she had been on lisinopril in the past, but was taken      off of this by renal due to some renal insufficiency and hypotension.  I      will repeat a chest x-ray in the morning.  She does have a cough and I      will rule out an underlying pneumonia.  This is less likely, however.      Also, given her recent surgery I do think it would be prudent to get a      VQ scan to rule out pulmonary embolism.  My clinical suspicion, however,      is fairly low probability.  I will cover for deep venous thrombosis      prophylaxis at this time with Lovenox.  2.  Hypoglycemia.  I will hold her diabetic medications and monitor.  3.  Type 2 diabetes.  4.  Recent fem-pop bypass.  The granddaughter is requesting that CVTS see      patient's wound while in-house.  5.  History of breast cancer.  6.  Hypertension.  7.  Chronic renal insufficiency.  8.  Renal anemia.  9.  Gout.  10. Glaucoma.  11. Chronic obstructive pulmonary disease.  12. History of transient ischemic attack.  13. Hyperlipidemia.  14. Coronary artery disease with normal ejection fraction by catheterization      in 2001.  15. History of coronary artery bypass graft.  16. Do not resuscitate per patient request.   Also, I will get an echocardiogram as I have no recent ejection fractions.  It may also be worthwhile to call Dr. Donnie Aho in the morning.  I will also  check a UA to rule out urinary tract infection as the cause of her  hypoglycemia and poor appetite.      Corinna L. Lendell Caprice, MD  Electronically Signed     CLS/MEDQ  D:  12/04/2005  T:  12/05/2005  Job:  [id number]   cc:   Bryan Lemma. Manus Gunning, M.D.  Fax: [phone]   Ines Bloomer, M.D.  [address]  [place]  [place] [address]   Lacretia Nicks. Viann Fish, M.D.  Fax: [phone]  Email:  [email or web address]   Garnetta Buddy, M.D.  Fax: [phone]

## 2011-04-05 NOTE — Procedures (Unsigned)
BYPASS GRAFT EVALUATION  INDICATION:  Followup peripheral vascular disease.  HISTORY: Diabetes:  Yes. Cardiac:  CAD. Hypertension:  Yes. Smoking:  Previous. Previous Surgery:  Right femoral to popliteal bypass graft on 03/07/1999, left femoral to popliteal bypass graft 11/23/2005.  SINGLE LEVEL ARTERIAL EXAM                              RIGHT              LEFT Brachial:                    142                161 Anterior tibial:             Biphasic           Monophasic Posterior tibial:            Monophasic         Biphasic Peroneal: Ankle/brachial index:        Noncompressible    Noncompressible Toe brachial index:          0.55               0.48  PREVIOUS TBI:  Date:  09/13/2010  RIGHT:  0.49  LEFT:  0.54  LOWER EXTREMITY BYPASS GRAFT DUPLEX EXAM:  DUPLEX:  Elevated velocities present suggesting hemodynamically significant stenosis of >75% with a ratio of 4 in the right proximal/mid segment of the femoral to popliteal bypass graft. Elevated velocities with a ratio of 2.9 noted in the right tibial peroneal trunk artery suggesting hemodynamically significant stenosis in the 50%-75% range. Also 50%-75% stenosis present in the left distal external iliac artery.    IMPRESSION: 1. Elevated velocities with hemodynamically significant stenosis as     noted above. 2. Stable toe brachial indices since previous study on 09/13/2010. 3. Increased presence of disease noted since previous study on     09/13/2010.      ___________________________________________ Di Kindle. Edilia Bo, M.D.  SH/MEDQ  D:  03/24/2011  T:  03/24/2011  Job:  [id number]

## 2011-04-17 ENCOUNTER — Emergency Department (HOSPITAL_COMMUNITY)
Admission: EM | Admit: 2011-04-17 | Discharge: 2011-04-18 | Disposition: A | Discharge status: Home / Self Care | Payer: Medicare Other | Attending: Emergency Medicine | Admitting: Emergency Medicine

## 2011-04-17 DIAGNOSIS — I509 Heart failure, unspecified: Secondary | ICD-10-CM | POA: Insufficient documentation

## 2011-04-17 DIAGNOSIS — M069 Rheumatoid arthritis, unspecified: Secondary | ICD-10-CM | POA: Insufficient documentation

## 2011-04-17 DIAGNOSIS — M79609 Pain in unspecified limb: Secondary | ICD-10-CM | POA: Insufficient documentation

## 2011-04-17 DIAGNOSIS — I10 Essential (primary) hypertension: Secondary | ICD-10-CM | POA: Insufficient documentation

## 2011-04-17 DIAGNOSIS — I82409 Acute embolism and thrombosis of unspecified deep veins of unspecified lower extremity: Secondary | ICD-10-CM | POA: Insufficient documentation

## 2011-04-17 DIAGNOSIS — M109 Gout, unspecified: Secondary | ICD-10-CM | POA: Insufficient documentation

## 2011-04-17 DIAGNOSIS — I2510 Atherosclerotic heart disease of native coronary artery without angina pectoris: Secondary | ICD-10-CM | POA: Insufficient documentation

## 2011-04-17 LAB — D-DIMER, QUANTITATIVE: D-Dimer, Quant: 2.8 ug/mL-FEU — ABNORMAL HIGH (ref 0.00–0.48)

## 2011-04-17 LAB — DIFFERENTIAL
Basophils Relative: 0 % (ref 0–1)
Eosinophils Absolute: 0.1 10*3/uL (ref 0.0–0.7)
Eosinophils Relative: 2 % (ref 0–5)
Lymphs Abs: 1 10*3/uL (ref 0.7–4.0)
Monocytes Relative: 5 % (ref 3–12)
Neutro Abs: 5.3 10*3/uL (ref 1.7–7.7)

## 2011-04-17 LAB — CBC
MCV: 72 fL — ABNORMAL LOW (ref 78.0–100.0)
RDW: 15.9 % — ABNORMAL HIGH (ref 11.5–15.5)

## 2011-04-17 LAB — APTT: aPTT: 30 seconds (ref 24–37)

## 2011-04-17 LAB — PROTIME-INR: Prothrombin Time: 14.4 seconds (ref 11.6–15.2)

## 2011-04-17 LAB — BASIC METABOLIC PANEL
BUN: 41 mg/dL — ABNORMAL HIGH (ref 6–23)
CO2: 21 mEq/L (ref 19–32)
Chloride: 101 mEq/L (ref 96–112)
Creatinine, Ser: 1.65 mg/dL — ABNORMAL HIGH (ref 0.4–1.2)
GFR calc Af Amer: 36 mL/min — ABNORMAL LOW (ref >60)
Potassium: 4 mEq/L (ref 3.5–5.1)
Sodium: 137 mEq/L (ref 135–145)

## 2011-04-18 ENCOUNTER — Ambulatory Visit (HOSPITAL_COMMUNITY)
Admission: RE | Admit: 2011-04-18 | Discharge: 2011-04-18 | Disposition: A | Discharge status: Home / Self Care | Payer: Medicare Other | Source: Ambulatory Visit | Attending: Emergency Medicine | Admitting: Emergency Medicine

## 2011-04-18 DIAGNOSIS — M79609 Pain in unspecified limb: Secondary | ICD-10-CM

## 2011-05-09 ENCOUNTER — Ambulatory Visit: Payer: Medicare Other | Attending: Family Medicine

## 2011-05-09 DIAGNOSIS — R262 Difficulty in walking, not elsewhere classified: Secondary | ICD-10-CM | POA: Insufficient documentation

## 2011-05-09 DIAGNOSIS — M25569 Pain in unspecified knee: Secondary | ICD-10-CM | POA: Insufficient documentation

## 2011-05-09 DIAGNOSIS — M6281 Muscle weakness (generalized): Secondary | ICD-10-CM | POA: Insufficient documentation

## 2011-05-09 DIAGNOSIS — IMO0001 Reserved for inherently not codable concepts without codable children: Secondary | ICD-10-CM | POA: Insufficient documentation

## 2011-05-31 ENCOUNTER — Encounter (INDEPENDENT_AMBULATORY_CARE_PROVIDER_SITE_OTHER): Payer: Medicare Other

## 2011-05-31 ENCOUNTER — Ambulatory Visit (INDEPENDENT_AMBULATORY_CARE_PROVIDER_SITE_OTHER): Payer: Medicare Other | Admitting: Thoracic Diseases

## 2011-05-31 VITALS — BP 154/71 | HR 64 | Resp 14 | Ht 63.0 in | Wt 200.0 lb

## 2011-05-31 DIAGNOSIS — Z48812 Encounter for surgical aftercare following surgery on the circulatory system: Secondary | ICD-10-CM

## 2011-05-31 DIAGNOSIS — I803 Phlebitis and thrombophlebitis of lower extremities, unspecified: Secondary | ICD-10-CM

## 2011-05-31 DIAGNOSIS — I739 Peripheral vascular disease, unspecified: Secondary | ICD-10-CM

## 2011-05-31 DIAGNOSIS — I8000 Phlebitis and thrombophlebitis of superficial vessels of unspecified lower extremity: Secondary | ICD-10-CM

## 2011-05-31 NOTE — Progress Notes (Signed)
[facility] HISTORY AND PHYSICAL   History of Present Illness  [name] is a 77 y.o. female patient who presents with chief complaint of right lower extremity pain swelling and reddened area in the medial aspect of her right calf. Pt. Was seen at [facility] approximately mid May and DVT was ruled out. Pt notes that reddened area appeared approximately 1 week with pain increasing since middle of June. She also notes some chills but no fever. Pt has Positive rest pain in the area of the swelling as well as night pain Negative non healing ulcers on right lower extremity. She has no definitive claudication symptoms and states she has good motion and sensation in the foot. Pt on vicodin and lidocaine patch given to her by her PMD  Pt has had previous intervention of  RIGHT Femoral to Popliteal bypass with Gortex by Dr. Edwyna Shell on 03/07/1999 and a left F-P bypass with Gortex on1/09/2006 .  Non-Invasive Vascular Imaging ABI: RIGHT unable to perform;  LEFT TBI  0.55 unchanged DUPLEX SCAN OF BYPASS: done 03/24/11 showed >75% stenosis in right prox /mid segment of fem-pop graft   ROS: 12 point ROS Negative except for + weight loss sec. To pain in leg. ; denies CP,DOE,SOB;pt states she uses home O2 at night  Past Medical History  Diagnosis Date  . DM (diabetes mellitus) type II controlled peripheral vascular disorder   . HTN (hypertension)   . CHF (congestive heart failure)   . CKD (chronic kidney disease)   . CAD (coronary artery disease)   . COPD (chronic obstructive pulmonary disease)   . Cataracts, bilateral     History   Social History  . Marital Status: Widowed    Spouse Name: N/A    Number of Children: N/A  . Years of Education: N/A   Occupational History  . Not on file.   Social History Main Topics  . Smoking status: Former Smoker    Types: Cigarettes    Quit date: 04/13/1977  . Smokeless tobacco: Not on file  . Alcohol Use: Not on  file  . Drug Use: Not on file  . Sexually Active: Not on file   Other Topics Concern  . Not on file   Social History Narrative  . No narrative on file    Allergies  Allergen Reactions  . Celebrex (Celecoxib) Rash  . Iodine Rash    Mededs: see list in chart Physical Examination Filed Vitals:   05/31/11 1634  BP: 154/71  Pulse: 64  Resp: 14  Height: 5\' 3"  (1.6 m)  Weight: 200 lb (90.719 kg)   General: A&O x 3, WDWN Eyes: PERRLA, Pulmonary:CTAB, Negative  Rales, Negative rhonchi, & Negative wheezing,  Cardiac: regular Rythm ,  Negative Murmurs,  Negative  rubs or gallops  Vascular:     RIGHT   LEFT             Femoral Pt unable to lay on table with large panis could not feel pulses in groin area Pt unable to lay on table with large panis could not feel pulses in groin area          POSTERIOR TIBIAL non-Dopplerable absent non-Dopplerable absent    DORSALIS PEDIS  monophasic by Doppler  monophasic by Doppler         PERONEAL absent non-Dopplerable    monophasic by Doppler   BLE warm , well perfused, no lesions, ulcers noted RLE positive swelling, tenderness and  cellulitis on medial aspect of calf. This area is very tender to palpation.  Calf soft.   Gastrointestinal: soft, NTND soft, nontender, BS WNL, no r/g,  Musculoskeletal:Strength 5/5 right lower extremity and left lower extremity Extremities without ischemic changes .  Neurologic: A&O X 3; Appropriate Affect ; SENSATION ;normal; MOTOR FUNCTION: normal 5/5 strength in all tested muscle groups   ASSESSMENT: [name] is a 77 y.o. female who presents with: right lower extremity phlebitis with venous stasis disease with no active ulcers. This is causing severe pain in this aspect of her leg. She has no definitive claudication symptoms even in light of right fem-pop graft stenosis.  PLAN: We placed pt on keflex TID for cellulitis Instructions given regarding warm compresses to right leg and need  for elevation as tolerated She will return to see Dr. Edilia Bo in 2-3 weeks If symptoms do not improve and duplex of right F-P bypass is worse may need angiogram

## 2011-05-31 NOTE — Patient Instructions (Addendum)
Warm compresses to Right calf 2-3 times per day. Do not use heating pad  Take antibiotics as ordered and finish  Elevate right leg as much as possible  Follow-up Appt with Dr. Edilia Bo

## 2011-06-01 ENCOUNTER — Encounter: Payer: Self-pay | Admitting: Vascular Surgery

## 2011-06-05 ENCOUNTER — Encounter (INDEPENDENT_AMBULATORY_CARE_PROVIDER_SITE_OTHER): Payer: Medicare Other

## 2011-06-05 ENCOUNTER — Inpatient Hospital Stay (HOSPITAL_COMMUNITY): Payer: Medicare Other

## 2011-06-05 ENCOUNTER — Ambulatory Visit: Payer: Medicare Other | Admitting: Surgery

## 2011-06-05 ENCOUNTER — Inpatient Hospital Stay (HOSPITAL_COMMUNITY)
Admission: AD | Admit: 2011-06-05 | Discharge: 2011-06-13 | DRG: 301 | Disposition: A | Discharge status: Home Health | Payer: Medicare Other | Source: Ambulatory Visit | Attending: Surgery | Admitting: Surgery

## 2011-06-05 DIAGNOSIS — H269 Unspecified cataract: Secondary | ICD-10-CM | POA: Diagnosis present

## 2011-06-05 DIAGNOSIS — Z888 Allergy status to other drugs, medicaments and biological substances status: Secondary | ICD-10-CM

## 2011-06-05 DIAGNOSIS — M79609 Pain in unspecified limb: Secondary | ICD-10-CM

## 2011-06-05 DIAGNOSIS — N189 Chronic kidney disease, unspecified: Secondary | ICD-10-CM | POA: Diagnosis present

## 2011-06-05 DIAGNOSIS — J4489 Other specified chronic obstructive pulmonary disease: Secondary | ICD-10-CM | POA: Diagnosis present

## 2011-06-05 DIAGNOSIS — J449 Chronic obstructive pulmonary disease, unspecified: Secondary | ICD-10-CM | POA: Diagnosis present

## 2011-06-05 DIAGNOSIS — I129 Hypertensive chronic kidney disease with stage 1 through stage 4 chronic kidney disease, or unspecified chronic kidney disease: Secondary | ICD-10-CM | POA: Diagnosis present

## 2011-06-05 DIAGNOSIS — I2510 Atherosclerotic heart disease of native coronary artery without angina pectoris: Secondary | ICD-10-CM | POA: Diagnosis present

## 2011-06-05 DIAGNOSIS — I509 Heart failure, unspecified: Secondary | ICD-10-CM | POA: Diagnosis present

## 2011-06-05 DIAGNOSIS — Z951 Presence of aortocoronary bypass graft: Secondary | ICD-10-CM

## 2011-06-05 DIAGNOSIS — I803 Phlebitis and thrombophlebitis of lower extremities, unspecified: Secondary | ICD-10-CM | POA: Diagnosis present

## 2011-06-05 DIAGNOSIS — E119 Type 2 diabetes mellitus without complications: Secondary | ICD-10-CM | POA: Diagnosis present

## 2011-06-05 DIAGNOSIS — I739 Peripheral vascular disease, unspecified: Principal | ICD-10-CM | POA: Diagnosis present

## 2011-06-05 DIAGNOSIS — Z7982 Long term (current) use of aspirin: Secondary | ICD-10-CM

## 2011-06-05 LAB — BASIC METABOLIC PANEL
BUN: 34 mg/dL — ABNORMAL HIGH (ref 6–23)
Chloride: 99 mEq/L (ref 96–112)
Creatinine, Ser: 1.3 mg/dL — ABNORMAL HIGH (ref 0.50–1.10)
GFR calc Af Amer: 47 mL/min — ABNORMAL LOW (ref >60)

## 2011-06-05 LAB — GLUCOSE, CAPILLARY
Glucose-Capillary: 47 mg/dL — ABNORMAL LOW (ref 70–99)
Glucose-Capillary: 139 mg/dL — ABNORMAL HIGH (ref 70–99)

## 2011-06-05 LAB — CBC
Hemoglobin: 11.1 g/dL — ABNORMAL LOW (ref 12.0–15.0)
RBC: 4.72 MIL/uL (ref 3.87–5.11)
WBC: 8.2 10*3/uL (ref 4.0–10.5)

## 2011-06-05 LAB — PROTIME-INR: Prothrombin Time: 14.4 seconds (ref 11.6–15.2)

## 2011-06-06 DIAGNOSIS — M79609 Pain in unspecified limb: Secondary | ICD-10-CM

## 2011-06-06 HISTORY — PX: OTHER SURGICAL HISTORY: SHX169

## 2011-06-06 LAB — GLUCOSE, CAPILLARY
Glucose-Capillary: 148 mg/dL — ABNORMAL HIGH (ref 70–99)
Glucose-Capillary: 67 mg/dL — ABNORMAL LOW (ref 70–99)
Glucose-Capillary: 252 mg/dL — ABNORMAL HIGH (ref 70–99)

## 2011-06-06 LAB — POCT ACTIVATED CLOTTING TIME
Activated Clotting Time: 221 seconds
Activated Clotting Time: 188 seconds

## 2011-06-06 NOTE — Assessment & Plan Note (Signed)
OFFICE VISIT  [name], [name] DOB:  Nov 10, 1927                                       06/05/2011 AVWUJ#:[id number]  I admitted her to the hospital from clinic today and therefore I dictated an admission history and physical on the hospital line. Dictation code is [id number].    Jorge Ny, MD Electronically Signed  VWB/MEDQ  D:  06/05/2011  T:  06/06/2011  Job:  [id number]

## 2011-06-07 ENCOUNTER — Inpatient Hospital Stay (HOSPITAL_COMMUNITY): Payer: Medicare Other

## 2011-06-07 DIAGNOSIS — I739 Peripheral vascular disease, unspecified: Secondary | ICD-10-CM

## 2011-06-07 DIAGNOSIS — Z01810 Encounter for preprocedural cardiovascular examination: Secondary | ICD-10-CM

## 2011-06-07 DIAGNOSIS — I059 Rheumatic mitral valve disease, unspecified: Secondary | ICD-10-CM

## 2011-06-07 LAB — BASIC METABOLIC PANEL
Chloride: 103 mEq/L (ref 96–112)
GFR calc Af Amer: 41 mL/min — ABNORMAL LOW (ref >60)
Potassium: 4.2 mEq/L (ref 3.5–5.1)

## 2011-06-07 LAB — CBC
HCT: 34 % — ABNORMAL LOW (ref 36.0–46.0)
Platelets: 317 10*3/uL (ref 150–400)
RDW: 15.7 % — ABNORMAL HIGH (ref 11.5–15.5)
WBC: 6.5 10*3/uL (ref 4.0–10.5)

## 2011-06-07 LAB — GLUCOSE, CAPILLARY
Glucose-Capillary: 185 mg/dL — ABNORMAL HIGH (ref 70–99)
Glucose-Capillary: 210 mg/dL — ABNORMAL HIGH (ref 70–99)
Glucose-Capillary: 144 mg/dL — ABNORMAL HIGH (ref 70–99)

## 2011-06-08 ENCOUNTER — Inpatient Hospital Stay (HOSPITAL_COMMUNITY): Payer: Medicare Other

## 2011-06-08 DIAGNOSIS — I2510 Atherosclerotic heart disease of native coronary artery without angina pectoris: Secondary | ICD-10-CM

## 2011-06-08 LAB — GLUCOSE, CAPILLARY: Glucose-Capillary: 167 mg/dL — ABNORMAL HIGH (ref 70–99)

## 2011-06-08 MED ORDER — TECHNETIUM TC 99M TETROFOSMIN IV KIT
30.0000 | PACK | Freq: Once | INTRAVENOUS | Status: AC | PRN
  Administered 2011-06-08: 30 via INTRAVENOUS

## 2011-06-08 MED ORDER — TECHNETIUM TC 99M TETROFOSMIN IV KIT
10.0000 | PACK | Freq: Once | INTRAVENOUS | Status: AC | PRN
  Administered 2011-06-08: 10 via INTRAVENOUS

## 2011-06-09 ENCOUNTER — Other Ambulatory Visit (HOSPITAL_COMMUNITY): Payer: Medicare Other

## 2011-06-09 ENCOUNTER — Encounter (HOSPITAL_COMMUNITY): Payer: Medicare Other | Attending: Internal Medicine

## 2011-06-09 LAB — CROSSMATCH
ABO/RH(D): A POS
Antibody Screen: NEGATIVE
Unit division: 0

## 2011-06-09 LAB — GLUCOSE, CAPILLARY: Glucose-Capillary: 127 mg/dL — ABNORMAL HIGH (ref 70–99)

## 2011-06-10 LAB — BASIC METABOLIC PANEL
CO2: 24 mEq/L (ref 19–32)
Calcium: 8.9 mg/dL (ref 8.4–10.5)
Chloride: 102 mEq/L (ref 96–112)
Sodium: 137 mEq/L (ref 135–145)

## 2011-06-10 LAB — GLUCOSE, CAPILLARY
Glucose-Capillary: 112 mg/dL — ABNORMAL HIGH (ref 70–99)
Glucose-Capillary: 174 mg/dL — ABNORMAL HIGH (ref 70–99)
Glucose-Capillary: 72 mg/dL (ref 70–99)

## 2011-06-10 LAB — CBC
Platelets: 252 10*3/uL (ref 150–400)
RBC: 4.17 MIL/uL (ref 3.87–5.11)
WBC: 6.8 10*3/uL (ref 4.0–10.5)

## 2011-06-11 LAB — GLUCOSE, CAPILLARY
Glucose-Capillary: 109 mg/dL — ABNORMAL HIGH (ref 70–99)
Glucose-Capillary: 139 mg/dL — ABNORMAL HIGH (ref 70–99)

## 2011-06-12 LAB — GLUCOSE, CAPILLARY
Glucose-Capillary: 140 mg/dL — ABNORMAL HIGH (ref 70–99)
Glucose-Capillary: 68 mg/dL — ABNORMAL LOW (ref 70–99)

## 2011-06-13 LAB — CBC
HCT: 26.8 % — ABNORMAL LOW (ref 36.0–46.0)
Hemoglobin: 8.8 g/dL — ABNORMAL LOW (ref 12.0–15.0)
MCHC: 32.8 g/dL (ref 30.0–36.0)

## 2011-06-13 LAB — GLUCOSE, CAPILLARY: Glucose-Capillary: 95 mg/dL (ref 70–99)

## 2011-06-13 LAB — POCT ACTIVATED CLOTTING TIME: Activated Clotting Time: 160 seconds

## 2011-06-15 ENCOUNTER — Emergency Department (HOSPITAL_COMMUNITY)
Admission: EM | Admit: 2011-06-15 | Discharge: 2011-06-15 | Disposition: A | Discharge status: Home / Self Care | Payer: Medicare Other | Attending: Emergency Medicine | Admitting: Emergency Medicine

## 2011-06-15 DIAGNOSIS — R5381 Other malaise: Secondary | ICD-10-CM | POA: Insufficient documentation

## 2011-06-15 DIAGNOSIS — I10 Essential (primary) hypertension: Secondary | ICD-10-CM | POA: Insufficient documentation

## 2011-06-15 DIAGNOSIS — I2510 Atherosclerotic heart disease of native coronary artery without angina pectoris: Secondary | ICD-10-CM | POA: Insufficient documentation

## 2011-06-15 DIAGNOSIS — M069 Rheumatoid arthritis, unspecified: Secondary | ICD-10-CM | POA: Insufficient documentation

## 2011-06-15 DIAGNOSIS — E162 Hypoglycemia, unspecified: Secondary | ICD-10-CM | POA: Insufficient documentation

## 2011-06-15 DIAGNOSIS — I509 Heart failure, unspecified: Secondary | ICD-10-CM | POA: Insufficient documentation

## 2011-06-15 DIAGNOSIS — I440 Atrioventricular block, first degree: Secondary | ICD-10-CM | POA: Insufficient documentation

## 2011-06-15 LAB — CBC
Platelets: 249 10*3/uL (ref 150–400)
RDW: 15.8 % — ABNORMAL HIGH (ref 11.5–15.5)
WBC: 14.1 10*3/uL — ABNORMAL HIGH (ref 4.0–10.5)

## 2011-06-15 LAB — COMPREHENSIVE METABOLIC PANEL
ALT: 23 U/L (ref 0–35)
Albumin: 3.2 g/dL — ABNORMAL LOW (ref 3.5–5.2)
Alkaline Phosphatase: 123 U/L — ABNORMAL HIGH (ref 39–117)
Potassium: 4.1 mEq/L (ref 3.5–5.1)
Sodium: 133 mEq/L — ABNORMAL LOW (ref 135–145)
Total Protein: 7.3 g/dL (ref 6.0–8.3)

## 2011-06-15 LAB — DIFFERENTIAL
Basophils Absolute: 0 10*3/uL (ref 0.0–0.1)
Basophils Relative: 0 % (ref 0–1)
Eosinophils Absolute: 0.1 10*3/uL (ref 0.0–0.7)
Eosinophils Relative: 1 % (ref 0–5)
Lymphocytes Relative: 5 % — ABNORMAL LOW (ref 12–46)

## 2011-06-15 LAB — GLUCOSE, CAPILLARY
Glucose-Capillary: 95 mg/dL (ref 70–99)
Glucose-Capillary: 159 mg/dL — ABNORMAL HIGH (ref 70–99)

## 2011-06-19 ENCOUNTER — Ambulatory Visit (INDEPENDENT_AMBULATORY_CARE_PROVIDER_SITE_OTHER): Payer: Medicare Other | Admitting: Surgery

## 2011-06-19 ENCOUNTER — Encounter: Payer: Self-pay | Admitting: Surgery

## 2011-06-19 VITALS — BP 167/63 | HR 56 | Temp 98.4°F | Ht 63.0 in | Wt 200.0 lb

## 2011-06-19 DIAGNOSIS — I70269 Atherosclerosis of native arteries of extremities with gangrene, unspecified extremity: Secondary | ICD-10-CM

## 2011-06-19 DIAGNOSIS — E1159 Type 2 diabetes mellitus with other circulatory complications: Secondary | ICD-10-CM

## 2011-06-19 NOTE — Progress Notes (Signed)
Subjective:     Patient ID: [name], female   DOB: 28-Aug-1927, 77 y.o.   MRN: [id number]  HPI [name] is a very pleasant 77 year old female is back today for followup. She has an extensive past vascular history. She has undergone bilateral femoral-popliteal bypass grafts by Dr. Edwyna Shell. Her most recent ultrasound was in May which showed a patent right femoropopliteal bypass graft with elevated velocities. In July of this year she came in with pain in her right foot and calf and her ultrasound revealed an occluded bypass graft. At that time the area of concern was on the medial calf it was red and swollen it was felt to be a phlebitis at that time the patient was treated with warm compresses and antibiotics. She then re\re presented with worsening pain in her calf. I elected to admit her to the hospital for pain control. While in the hospital she underwent angiography at the time of her antegrade she was found to have a very stiff aortic bifurcation which could not be crossed and necessitated bilateral access. She had extremely scarred and groins and ultimately was not able to be revascularized percutaneously were able to get her pain under control and she ultimately went home. She was evaluated by cardiology during her stay and found to be moderate to high risk for any form of surgical revascularization patient is back today for followup. She is still complaining of pain in her right calf although it is somewhat improved. She did have an MRI while she was in the hospital was suggested myositis.   Review of Systems  Constitutional: Negative for fever.  HENT: Negative for ear discharge.   Cardiovascular: Negative for chest pain.  Skin: Positive for color change and wound.  All other systems reviewed and are negative.   Past Medical History  Diagnosis Date  . DM (diabetes mellitus) type II controlled peripheral vascular disorder   . HTN (hypertension)   . CHF (congestive heart failure)   .  CKD (chronic kidney disease)   . CAD (coronary artery disease)   . COPD (chronic obstructive pulmonary disease)   . Cataracts, bilateral   . Diabetes mellitus   . Hyperlipidemia   . CAD (coronary artery disease)   . S/P femoral-popliteal bypass surgery bilateral  . Glaucoma   . TIA (transient ischemic attack)   . Chronic kidney disease     History  Substance Use Topics  . Smoking status: Former Smoker    Types: Cigarettes    Quit date: 04/13/1977  . Smokeless tobacco: Never Used  . Alcohol Use: No    Family History  Problem Relation Age of Onset  . Hypertension Mother   . Peripheral vascular disease Mother   . Diabetes Father   . Kidney disease Sister   . Cancer Brother     lung  . Lung disease Sister   . Lung disease Sister     Allergies  Allergen Reactions  . Codeine   . Omnipaque (Iohexol)   . Celebrex (Celecoxib) Rash  . Iodine Rash    Current outpatient prescriptions:allopurinol (ZYLOPRIM) 100 MG tablet, Take 100 mg by mouth daily.  , Disp: , Rfl: ;  amLODipine (NORVASC) 5 MG tablet, Take 5 mg by mouth daily.  , Disp: , Rfl: ;  aspirin 81 MG tablet, Take 81 mg by mouth daily.  , Disp: , Rfl: ;  b complex-vitamin c-folic acid (NEPHRO-VITE) 0.8 MG TABS, Take 0.8 mg by mouth at bedtime.  , Disp: ,  Rfl:  cloNIDine (CATAPRES) 0.1 MG tablet, Take 0.1 mg by mouth 2 (two) times daily.  , Disp: , Rfl: ;  clotrimazole (LOTRIMIN) 1 % external solution, Apply topically as needed. Apply to fungal rash on foot , Disp: , Rfl: ;  colchicine 0.6 MG tablet, Take 0.6 mg by mouth daily. 1 tablet once or twice a day as needed for acute gout flare , Disp: , Rfl: ;  econazole nitrate 1 % cream, Apply 1 application topically 2 (two) times daily.  , Disp: , Rfl:  ezetimibe (ZETIA) 10 MG tablet, Take 10 mg by mouth daily.  , Disp: , Rfl: ;  famotidine (PEPCID) 20 MG tablet, Take 20 mg by mouth 2 (two) times daily.  , Disp: , Rfl: ;  Filgrastim (NEUPOGEN IJ), Inject as directed every 30  (thirty) days.  , Disp: , Rfl: ;  furosemide (LASIX) 40 MG tablet, Take 40 mg by mouth 2 (two) times daily.  , Disp: , Rfl:  hydrALAZINE (APRESOLINE) 50 MG tablet, Take 50 mg by mouth 3 (three) times daily. 1 1/2 tablet orally three times a day , Disp: , Rfl: ;  HYDROcodone-acetaminophen (NORCO) 5-325 MG per tablet, Take 1 tablet by mouth every 6 (six) hours as needed. 1-2 tablets every 6 hours as needed for pain , Disp: , Rfl: ;  isosorbide dinitrate (ISORDIL) 40 MG tablet, Take 40 mg by mouth 2 (two) times daily at 10 am and 4 pm.  , Disp: , Rfl:  levofloxacin (LEVAQUIN) 500 MG tablet, Take 500 mg by mouth daily.  , Disp: , Rfl: ;  mupirocin (BACTROBAN) 2 % ointment, Apply 1 application topically 2 (two) times daily.  , Disp: , Rfl: ;  oxycodone (OXY-IR) 5 MG capsule, Take 5 mg by mouth every 4 (four) hours as needed.  , Disp: , Rfl: ;  sodium chloride 0.9 % irrigation, Irrigate with as directed once.  , Disp: , Rfl:  triamcinolone (KENALOG) 0.1 % cream, Apply topically 2 (two) times daily. Sparingly ro affected area as needed BID , Disp: , Rfl: ;  Vitamin D, Ergocalciferol, (DRISDOL) 50000 UNITS CAPS, Take 50,000 Units by mouth. 1 capsule once a month  , Disp: , Rfl: ;  glyBURIDE micronized (GLYNASE) 6 MG tablet, Take 6 mg by mouth daily with breakfast.  , Disp: , Rfl:   Filed Vitals:   06/19/11 1144  Height: 5\' 3"  (1.6 m)  Weight: 200 lb (90.719 kg)    Body mass index is 35.43 kg/(m^2).           Objective:   Physical Exam  Constitutional: She appears well-developed and well-nourished.  HENT:  Head: Normocephalic and atraumatic.  Neck: Neck supple.  Cardiovascular: Normal rate.        Pedal pulses are not palpable  Pulmonary/Chest: Effort normal.  Abdominal: Soft.  Musculoskeletal: Normal range of motion.  Skin:       Dry eschar on the medial side of her calf no evidence of infection the eschar is approximately 6 cm in diameter. There is some blistering around the anterior side of  the minimal drainage. No foul odor.       Assessment:    right leg wound    Plan:     I discussed 3 options with the patient and her family today appear to the first more drastic measure would be primary amputation to help with pain control while the family is aware there is a possibility this is certainly not our desired  option. Next choice would be to proceed with redo bypass surgery. With the patient's overall risk profile combined with the fact that I am not sure this is going to have a major impact on her wound we have decided not to pursue bypass grafting at this time. The third option would be continued observation with pain control. We have decided to proceed with continued observation and pain control. I have given the patient a prescription for 40 Percocet. The granddaughter works in the hospital I will contact me if there are any changes within the wound. Otherwise I will see her back in 2-3 weeks.

## 2011-06-21 ENCOUNTER — Ambulatory Visit: Payer: Medicare Other | Admitting: Vascular Surgery

## 2011-06-21 NOTE — Procedures (Unsigned)
DUPLEX DEEP VENOUS EXAM - LOWER EXTREMITY  INDICATION:  Right lower extremity pain.  HISTORY:  Edema:  No. Trauma/Surgery:  No. Pain:  Yes. PE:  No. Previous DVT:  No. Anticoagulants:  No. Other:  Right lower extremity femoral to popliteal bypass graft 03/07/1999.  DUPLEX EXAM:               CFV   SFV   PopV  PTV      GSV               R  L  R  L  R  L  R    L   R  L Thrombosis    o  o  o     o     NV   NV  o Spontaneous   +  +  +     +              + Phasic        +  +  +     +              + Augmentation  +  +  +     +              + Compressible  +  +  +     +              + Competent     o  o  +     +              +  Legend:  + - yes  o - no  p - partial  D - decreased  IMPRESSION: 1. No evidence of right lower extremity deep venous thrombosis.  Of     note:  No flow visualized within the right lower extremity femoral     to popliteal bypass graft.  Ankle brachial indices performed     05/31/2011 showing no flow detected in the right lower extremity. 2. Posterior tibial veins could not be adequately assessed due to     patient pain.   _____________________________ V. Charlena Cross, MD  EM/MEDQ  D:  06/06/2011  T:  06/06/2011  Job:  [id number]

## 2011-06-23 NOTE — Consult Note (Signed)
[name], [name]                ACCOUNT NO.:  [id number]  MEDICAL RECORD NO.:  [id number]  LOCATION:  6527                         FACILITY:  [facility]  PHYSICIAN:  Doylene Canning. Ladona Ridgel, MD    DATE OF BIRTH:  Aug 05, 1927  DATE OF CONSULTATION:  06/07/2011 DATE OF DISCHARGE:                                CONSULTATION   REQUESTING PHYSICIAN: 1. Durene Cal IV, MD  INDICATION FOR CONSULTATION:  Evaluation of surgical risk prior to peripheral vascular bypass procedure.  HISTORY OF PRESENT ILLNESS:  The patient is an 77 year old woman with a history of severe peripheral vascular disease and worsening pain in her right leg.  She has undergone angiography, which demonstrates an occluded right SFA and popliteal occlusion.  The patient also has stenosis in the right external and internal iliac artery.  She has a longstanding coronary artery disease history and is status post bypass surgery in 2001.  At that time, she was found to have poor target vessels, but underwent revascularization and has done relatively well. She underwent surgical revascularization of her lower extremities in 2007 as well as 2000, apparently without consequence.  She has felt poorly over the last several weeks.  She denies anginal symptoms.  She has no dyspnea with rest, but she admits to being very sedentary.  The patient did develop some confusion last night and still little bit confused today.  PAST MEDICAL HISTORY:  Notable for: 1. Diabetes. 2. Hypertension. 3. Chronic renal insufficiency with baseline creatinine of around 1.5. 4. COPD. 5. History of cataracts.  FAMILY HISTORY:  Negative for premature coronary artery disease.  SOCIAL HISTORY:  The patient is widowed.  She quit smoking cigarettes in 1978.  She denies alcohol use.  ALLERGIES:  She has a history of allergy to CELEBREX and IODINE.  REVIEW OF SYSTEMS:  The patient has pain in her leg as previously noted. She is very sedentary.  She has mild  arthritic complaints.  Otherwise, review of systems is negative except as noted in the HPI.  PHYSICAL EXAMINATION:  GENERAL:  She is a pleasant elderly-appearing woman in no acute distress. VITAL SIGNS:  Blood pressure was 170/80.  The pulse was 68 and regular. The respirations were 18.  Temperature was 98. HEENT:  Normocephalic and atraumatic.  Pupils were equal and round. Oropharynx was moist.  Sclerae were anicteric. NECK:  A 7-cm jugular venous distention.  There was no thyromegaly. Trachea was midline.  The carotids were 2+ and symmetric. LUNGS:  Rales in the bases.  No wheezes or rhonchi were present and there was no increased work of breathing. CARDIAC:  Regular rate and rhythm with normal S1 and S2.  There was a soft S4 gallop present.  The PMI did not appear to be enlarged or laterally displaced. ABDOMEN:  Obese, nontender, and nondistended.  There was no organomegaly. EXTREMITIES:  Some tenderness on the medial side of the right leg her. Her leg was not particularly warm.  There were no skin lesions.  IMPRESSION: 1. Severe peripheral vascular disease. 2. Known coronary artery disease, status post bypass surgery 11 years     ago. 3. Chronic renal insufficiency. 4. Hypertension.  DISCUSSION:  Currently, the patient is a very poor surgical candidate. I would recommend proceeding with Lexiscan Myoview and 2-D echo before giving final recommendations.  If her LV function is normal and a Myoview scan demonstrates no significant ischemia, then this will put her in a moderate risk category.  She will certainly not be at low risk no matter what we do for evaluating her.     Doylene Canning. Ladona Ridgel, MD     GWT/MEDQ  D:  06/07/2011  T:  06/07/2011  Job:  [id number]  cc:   Georga Hacking, M.D.  Electronically Signed by Lewayne Bunting MD on 06/23/2011 09:53:22 AM

## 2011-06-28 ENCOUNTER — Ambulatory Visit: Payer: Medicare Other | Admitting: Vascular Surgery

## 2011-07-03 ENCOUNTER — Encounter: Payer: Self-pay | Admitting: Surgery

## 2011-07-03 ENCOUNTER — Ambulatory Visit (INDEPENDENT_AMBULATORY_CARE_PROVIDER_SITE_OTHER): Payer: Medicare Other | Admitting: Surgery

## 2011-07-03 VITALS — BP 189/72 | HR 65 | Temp 98.2°F

## 2011-07-03 DIAGNOSIS — E1159 Type 2 diabetes mellitus with other circulatory complications: Secondary | ICD-10-CM

## 2011-07-03 DIAGNOSIS — I70269 Atherosclerosis of native arteries of extremities with gangrene, unspecified extremity: Secondary | ICD-10-CM

## 2011-07-03 NOTE — Progress Notes (Signed)
Subjective:     Patient ID: [name], female   DOB: [birth year], 77 y.o.   MRN: [id number]  HPI the patient returns today for followup for a right leg ulcer please see her previous note for her vascular details. I have been following a right leg wound for the past several weeks. She's undergone an arteriogram which showed bypass graft occlusion on the right. Due to the severe calcification within her vessels and the scar tissue I was unable to revascularize her from a percutaneous perspective. While she is in the hospital I had cardiology see her and she is deemed to be moderate to high risk for any form of operation we have been treating her for just a wound. MRI did indicated this was a myositis. It has progressed to a larger eschar without open wounds. It has not been infected. Last visit we discussed multiple options including primary amputation bypass surgery versus continued observation we elected to proceed with continued observation understanding that she may ultimately require amputation and that bypass may not get this area to heal. She is back today for followup. She states that her pain is improved she has less discomfort at this time he's not having any fevers or chills.   Review of Systems     Objective:   Physical Exam the right leg wound has a larger area of eschar the skin is a little more demarcated there are no open areas there is no drainage there is no erythema  Cardiovascular regular rhythm respirations nonlabored  General: Well-appearing in no acute distress     Assessment:     Right leg wound    Plan:     The area of concern has slightly increased in size. There are no open areas for wound care. The patient is just putting well on this and keeping it clean. I reiterated to the family that this may ultimately come to amputation however I do not feel that bypass surgery guarantees that this will heal in addition she is very high risk from a surgical perspective. We  are in agreement that continued observation is the best course of action. Open on seeing her back in 3 weeks. I did give her a prescription for 50 Percocet today

## 2011-07-13 NOTE — H&P (Signed)
[name], [name]                ACCOUNT NO.:  [id number]  MEDICAL RECORD NO.:  [id number]  LOCATION:  2007                         FACILITY:  [facility]  PHYSICIAN:  Juleen China IV, MDDATE OF BIRTH:  Jul 14, 1927  DATE OF ADMISSION:  06/05/2011 DATE OF DISCHARGE:                             HISTORY & PHYSICAL   REASON FOR VISIT:  Severe right leg pain.  HISTORY:  This is an 77 year old female, who comes as add-on patient today to the clinic with complaints of right leg pain.  She was seen in our office last week and diagnosed with lower extremity phlebitis.  She was given Keflex and told to use warm compresses.  The patient comes back today prematurely because of the intolerance of pain in her right leg.  The patient has a history of bilateral lower extremity bypass graft with Gore-Tex by Dr. Edwyna Shell.  The right was done in 2000 and the left in 2007.  The patient had an ultrasound in May 2012, which had elevated velocities in the proximal mid segment of the FEM-POP bypass graft on the right as well as elevated velocities with a ratio of 2.9 in the right tibioperoneal trunk and 50-75% stenosis in the distal left external iliac artery.  When she was rescanned last week, flow could not be detected in the right lower extremity.  The patient is unable to sleep at night.  She has persistent pain that is not alleviated with anything.  She does not have open wounds on her right foot or leg.  The patient suffers from diabetes and hypertension, which are medically managed.  She is a former smoker, quit in 1978.  REVIEW OF SYSTEMS:  Positive for weight loss.  No chest pain.  No shortness of breath.  She does use home oxygen at night.  PAST MEDICAL HISTORY: 1. Diabetes. 2. Hypertension. 3. Congestive heart failure. 4. Chronic kidney disease. 5. Coronary artery disease. 6. Chronic obstructive pulmonary disease. 7. Bilateral cataracts.  SOCIAL HISTORY:  She is widowed, does not smoke,  quit in 1978, does not drink.  ALLERGIES:  CELEBREX and IODINE, which cause rash.  FAMILY HISTORY:  Noncontributory.  PHYSICAL EXAMINATION:  VITAL SIGNS:  Heart rate 62, blood pressure 172/76, respiratory rate 28. GENERAL:  She is somewhat uncomfortable with pain in her right leg. HEENT:  Within normal limits. LUNGS:  Respirations nonlabored. CARDIOVASCULAR:  Pedal pulses are not palpable. ABDOMEN:  Obese, but soft. MUSCULOSKELETAL:  She has no obvious deformities.  The right leg has an area of severe tenderness on the medial side, just proximal to the ankle.  It is very tender to the touch.  It is not warm.  There is no significant erythema.  The patient has normal neurologic function in the foot.  She has normal sensation.  She can move her toes without difficulty.  I repeated the duplex ultrasound today, which was negative for DVT and it again confirms bypass graft occlusion.  ASSESSMENT:  Right leg pain.  PLAN:  Due to the patient's severe pain in the office today, I feel like she needs to be admitted to the hospital minimum for pain control.  I also think we need  to further evaluate the pain that she is having in her right leg.  My suspicion is that this is ischemic in nature with her bypass graft occlusion.  Her bypass graft was open 2 months ago and she was not having symptoms.  She developed symptoms approximately 1 month ago and we know that that was probably around the time that her bypass graft occluded.  She does need imaging of her leg.  I am unsure of what her baseline kidney function is, although she carries a diagnosis of renal insufficiency, so we will check labs when she gets to the hospital and get the appropriate imaging study once we know what her creatinine is.  Hopefully, she will have options for revascularization to see if this improves her symptoms.     Jorge Ny, MD     VWB/MEDQ  D:  06/05/2011  T:  06/06/2011  Job:   [id number]  Electronically Signed by Arelia Longest IV MD on 07/13/2011 12:00:29 AM

## 2011-07-13 NOTE — Op Note (Signed)
NAMEDEZHANE, STATEN                ACCOUNT NO.:  [id number]  MEDICAL RECORD NO.:  [id number]  LOCATION:  6527                         FACILITY:  [facility]  PHYSICIAN:  Juleen China IV, MDDATE OF BIRTH:  1927-05-13  DATE OF PROCEDURE:  06/06/2011 DATE OF DISCHARGE:                              OPERATIVE REPORT   PREOPERATIVE DIAGNOSIS:  Right leg pain.  POSTOPERATIVE DIAGNOSIS:  Right leg pain.  PROCEDURE PERFORMED: 1. Ultrasound access left femoral artery. 2. Abdominal aortogram. 3. Right leg runoff. 4. Central aortic catheterization. 5. Ultrasound access left femoral artery.  INDICATIONS:  Ms. Buckle is an 77 year old female who has undergone bypass graft in the remote past by Dr. Edwyna Shell.  She has had severe pain in the right leg for the past month.  Her bypass graft has recently occluded.  She comes in today for arteriogram for further diagnosis.  DESCRIPTION OF PROCEDURE:  The patient was identified in the holding and taken to room 7, placed supine on the table.  Both groins were prepped and draped in usual fashion.  Time-out was called.  Left femoral artery was evaluated with ultrasound and found to be patent.  The bypass graft was visualized.  Artery was accessed under ultrasound guidance with micropuncture needle.  A 0.018 wire was advanced.  I was unable to place the micropuncture sheath.  The dilator was placed.  Ultimately, the micropuncture sheath was able be placed and over an Amplatz superstiff wire, 5-French sheath was placed.  Next, an Omni flush catheter was advanced at the level of L1.  Abdominal aortogram was obtained. Catheter was pulled down the aortic bifurcation and pelvic angiogram was performed.  Aortic bifurcation was then crossed using Omni flush catheter and Glidewire, and end-hole catheter.  Right leg runoff was then performed.  FINDINGS:  Aortogram:  Visualized portions of suprarenal abdominal aorta showed no significant disease and there are  single renal arteries bilaterally.  The infrarenal abdominal aorta is patent.  Pelvic angiogram:  Left common iliac artery is widely patent.  There is a focal stenosis within the left external iliac artery at the level of the hypogastric artery.  This was heavily calcified.  The right common iliac artery was widely patent.  There was also a focal stenosis at the proximal right external iliac artery.  This was heavily calcified. Right hypogastric artery was patent.  Right lower extremity:  The right common femoral artery is widely patent.  The right profunda femoral artery is widely patent.  The right superficial femoral artery is occluded.  There is reconstitution of the above-knee popliteal artery.  The popliteal artery behind the knee is diseased.  There is a focal segment of narrowing at the below-knee popliteal artery.  The dominant runoff is the anterior and tibial artery.  It is diseased proximally and diffusely diseased throughout its course.  INTERVENTION:  At this point time, I elected to attempt intervention.  I tried to advance the sheath over the bifurcation and tried with multiple wires and catheters.  However, due to the stiffness at the aortic bifurcation, I was unable to advance the sheath over the aortic bifurcation.  I then aborted the idea of the intervening  on the superficial femoral artery on the right, but rather to intervene on the external iliac artery.  I could do this with an antegrade access and therefore the right groin was prepped for access.  It was evaluated with ultrasound.  The artery was accessed under ultrasound guidance with a micropuncture needle.  Micropuncture wire was advanced to the stenosis within the iliac artery.  I struggled to get the dilator from the micropuncture sheath, but ultimately was able to then get the micropuncture sheath.  I used an Amplatz superstiff wire, but could not get across the stenosis and iliac artery.  I tried  multiple dilators, but was unable to get any of the dilators to go in.  This included the 5, 6, 7, 8 [place] dilator.  With after much difficulty, I was able to get a 5-French sheath into the artery.  I then tried to get wire access across the area of stenosis.  However, a dissection plane was created with the Glidewire and I was ultimately unable to cross the iliac stenosis.  I did attempt to have a sheath from the other side, performed an injection which showed that the dissection was not flow-limiting. After this, it was felt that this was not best to be treated percutaneously and elected to abort the procedure today.  Catheters and wires were removed.  The patient was taken to holding area for sheath pull.  IMPRESSION: 1. Left external iliac stenosis. 2. Right external iliac stenosis. 3. Right superficial femoral artery occlusion with reconstitution     above-knee popliteal artery which is diseased.  There was stenosis     in the anterior tibial artery at its origin which is a dominant     vessel.  The anterior tibial artery has several areas of stenosis.     Jorge Ny, MD     VWB/MEDQ  D:  06/06/2011  T:  06/07/2011  Job:  [id number]  Electronically Signed by Arelia Longest IV MD on 07/13/2011 12:00:42 AM

## 2011-07-13 NOTE — Discharge Summary (Signed)
[name], [name]                ACCOUNT NO.:  [id number]  MEDICAL RECORD NO.:  [id number]  LOCATION:  2023                         FACILITY:  [facility]  PHYSICIAN:  Juleen China IV, MDDATE OF BIRTH:  Feb 23, 1927  DATE OF ADMISSION:  06/05/2011 DATE OF DISCHARGE:  06/13/2011                              DISCHARGE SUMMARY   ADMIT DIAGNOSIS:  Right lower extremity pain.  PAST MEDICAL HISTORY AND DISCHARGE DIAGNOSES: 1. Severe right leg pain secondary to chronic inguinal arterial     occlusive disease status post arteriogram. 2. Diabetes mellitus. 3. Hypertension. 4. Congestive heart failure. 5. Chronic kidney disease with a baseline creatinine of 1.5. 6. Coronary artery disease. 7. Chronic obstructive pulmonary disease. 8. Bilateral cataracts. 9. Possible phlebitis.  ALLERGIES: 1. IODINE rash. 2. CELEBREX rash. 3. LAMISIL rash. 4. CODEINE unknown reaction.  BRIEF HISTORY:  The patient is an 77 year old female who presented to Dr. Estanislado Spire office as an add-on patient in the clinic with complaints of right leg pain.  She had been seen in the office on the week prior and diagnosed with lower extremity phlebitis.  She was given a prescription of Keflex and told to use warm compresses.  On June 05, 2011, the patient presented prematurely because of an intolerance of pain in the right lower extremity.  She has a history of bilateral lower extremity bypass grafting with Gore-Tex by Dr. Edwyna Shell.  The right was done in 2000 and the left in 2007.  The patient had an ultrasound in May 2012 which had elevated velocities in the proximal mid segment of fem-pop bypass on the right as well as elevated velocities with a ratio of 2.9 in the right tibioperoneal trunk and a 50 to 75% stenosis in the distal left external iliac artery.  When she was re-scanned in early July the flow could not be detected in the right lower extremity.  The patient was unable to sleep at night secondary to rest  pain and she had no alleviating factors.  She denied open wounds on the right foot and leg. Secondary to the patient's known vascular disease and severe pain, she was admitted for pain control and to setup arteriogram for further evaluation with possible percutaneous intervention.  HOSPITAL COURSE:  The patient was admitted on June 05, 2011 for pain control as previously stated.  She was then taken to the cath lab on June 06, 2011 for an arteriogram with right lower extremity runoff. This revealed left external iliac artery stenosis, right external iliac artery stenosis, and right SFA artery occlusion with reconstitution of the above knee pop which is diseased.  There was also stenosis in the anterior tibial artery in its origin which is a dominant vessel. Anterior tibial artery had several areas of stenosis. Dr. Myra Gianotti was not able  to perform any percutaneous intervention.  An MRI was subsequently performed of the right lower extremity which showed soft tissue and bone inflammation.  Secondary to all of these findings, Dr. Myra Gianotti felt that the patient's issues are related to arterial insufficiency.  She was having some skin breakdown on the medial portion of the right calf.  Since the patient was having severe pain with  some tissue breakdown, Dr. Myra Gianotti obtained a cardiac evaluation for possible open surgical revascularization.  Cardiac evaluation was performed on June 07, 2011, and after a complete cardiac workup was completed, the patient was felt to be a moderate risk at least for surgery from a cardiac standpoint.  All of these issues were discussed with the patient and her family extensively.  The options presented to the patient included continued observation with pain control versus lower extremity bypass versus amputation.  The patient and family have decided to continue observation with pain control at this time.  The patient has remained afebrile with stable vital signs  throughout the hospital course.  Her pain has been well controlled.  She has been able to ambulate somewhat with physical therapy and this will need to be continued as an outpatient.  Physical exam, cardiac is regular rate and rhythm.  Lungs are clear to auscultation.  The abdomen is soft, nontender with active bowel sounds. The left lower extremity is warm to the touch.  Right lower extremity is warm to the level of the mid tibia which decreases to a cooler temperature distally.  There is an area on the medial aspect of the right calf that is slightly indurated with no erythema, no drainage, and no open wounds.  The bilateral groin puncture sites are well healed with no evidence of hematoma.  On June 13, 2011 the patient's only complaint is of some persistent burning in her heel.  She is again afebrile with stable vital signs. She has had a pre and postoperative anemia which she is tolerating without difficulty.  She was started on iron supplementation on June 13, 2011 which she will need to continue as an outpatient as well.  The patient's creatinine was elevated slightly status post arteriogram.This will need to be followed closely as an outpatient.  She will be discharged home in stable condition today with pain medication as well as follow up with Dr. Myra Gianotti as an outpatient.  She will also have home health physical therapy established.  LABORATORY DATA:  CBC on June 13, 2011, white count 7.2, hemoglobin 8.8, hematocrit 26.8, platelets 233.  BMP on June 10, 2011, sodium 137, potassium 3.4, BUN 30, creatinine 1.5, GFR 40.  DISCHARGE MEDICATIONS:  The patient received specific written discharge instructions regarding diet, activity and wound care.  She will follow up with Dr. Myra Gianotti in 2 weeks.  MEDICATIONS: 1. Aspirin 81 mg daily. 2. Colace 100 mg 1-2 tablets daily. 3. Iron sulfate 325 mg 1 p.o. b.i.d. 4. Levaquin 500 mg 1 p.o. daily. 5. Oxycodone 5 mg 1-2 q.4-6 h. p.r.n.  pain. 6. Allopurinol 100 mg 2 tablets daily. 7. Amlodipine 5 mg daily. 8. Clonidine 0.1 mg b.i.d. 9. Colchicine 0.6 mg 1 tablet b.i.d. p.r.n. 10.Econazole nitrate 1% topically b.i.d. p.r.n. 11.Neupogen 1 injection subcu every month. 12.Famotidine 20 mg b.i.d. 13.Furosemide 20 mg b.i.d. 14.Glucosamine OTC 3 tablets b.i.d. 15.Micronized glyburide 6 mg daily. 16.Hydralazine 50 mg 1-1/2 tablets t.i.d. 17.Isosorbide 40 mg b.i.d. 18.Lidocaine patch 5% transdermally daily p.r.n. 19.Lotrimin 1% for fungal rash on foot topically daily p.r.n. 20.Nephro-Vite daily. 21.Triamcinolone topical 0.1% topically b.i.d. p.r.n. 22.Vitamin D 50,000 units 1 capsule every month. 23.Zetia 10 mg daily.     Pecola Leisure, PA   ______________________________ V. Charlena Cross, MD    AY/MEDQ  D:  06/13/2011  T:  06/14/2011  Job:  [id number]  Electronically Signed by Pecola Leisure PA on 06/14/2011 09:26:30 AM Electronically Signed by Arelia Longest IV MD  on 07/13/2011 12:00:21 AM

## 2011-07-14 ENCOUNTER — Encounter: Payer: Self-pay | Admitting: Vascular Surgery

## 2011-07-14 ENCOUNTER — Telehealth: Payer: Self-pay

## 2011-07-14 NOTE — Telephone Encounter (Signed)
Pt. called office to report continuous pain in right leg and thigh.  Had RX for Percocet and requesting refill.  States her right leg hurts very bad and rates pain at 10/10.  Physical therapist got on phone while speaking w/ pt.and stated that she was not able to do physical therapy, due to pt's pain being unmanaged.  Also reports that the wound on back of right leg that is mostly dry, has 2 pinpoint areas that are draining a serosanguinous fluid, and noted an "odor" today.  Pt. c/o swelling in right leg from ft to knee.  Pt. Saw Dr Myra Gianotti 07/03/11, and was given Percocet # 50 at that time.  Pt. States she is out of Percocet as of yesterday.  Advised that Dr. Myra Gianotti is out of office today.  Discussed w/ Dr. Imogene Burn.  Was given v.o. for Hydrocodone/acetaminophen 5/500, take 1-2 tabs q 6 hrs/ prn / pain, # 20; no refills.  Called pain RX in to pharmacy.  Will schedule pt. to be eval. In office next week due to odor from right leg wound.  Pt. advised she will receive phonecall re: appt.  Verbalized understanding.

## 2011-07-18 ENCOUNTER — Encounter: Payer: Self-pay | Admitting: Vascular Surgery

## 2011-07-18 ENCOUNTER — Ambulatory Visit (INDEPENDENT_AMBULATORY_CARE_PROVIDER_SITE_OTHER): Payer: Medicare Other | Admitting: Vascular Surgery

## 2011-07-18 VITALS — BP 150/59 | HR 69 | Resp 20 | Ht 60.0 in

## 2011-07-18 DIAGNOSIS — I70269 Atherosclerosis of native arteries of extremities with gangrene, unspecified extremity: Secondary | ICD-10-CM

## 2011-07-18 NOTE — Progress Notes (Signed)
Subjective:     Patient ID: [name], female   DOB: Nov 14, 1926, 77 y.o.   MRN: [id number]  HPI this 77 year old female who is followed by Dr. Myra Gianotti is referred to the office today by her home therapist. She has extensive gangrenous skin on the medial aspect of the right leg which has been followed with the possibility of an above-knee amputation versus an attempt at revascularization. She is having significant pain at home and is taking Percocet for this with some relief. She did not have a lesion which was amenable to percutaneous intervention by Dr. Myra Gianotti because of heavy calcification . She previously had a right femoral-popliteal bypass graft with Gore-Tex perform a Dr. Edwyna Shell in the past. Past Medical History  Diagnosis Date  . DM (diabetes mellitus) type II controlled peripheral vascular disorder   . HTN (hypertension)   . CHF (congestive heart failure)   . CKD (chronic kidney disease)   . CAD (coronary artery disease)   . COPD (chronic obstructive pulmonary disease)   . Cataracts, bilateral   . Diabetes mellitus   . Hyperlipidemia   . CAD (coronary artery disease)   . S/P femoral-popliteal bypass surgery bilateral  . Glaucoma   . TIA (transient ischemic attack)   . Chronic kidney disease     History  Substance Use Topics  . Smoking status: Former Smoker -- 1.0 packs/day for 40 years    Types: Cigarettes    Quit date: 04/13/1977  . Smokeless tobacco: Never Used  . Alcohol Use: No    Family History  Problem Relation Age of Onset  . Hypertension Mother   . Peripheral vascular disease Mother   . Diabetes Father   . Kidney disease Sister   . Cancer Brother     lung  . Lung disease Sister   . Lung disease Sister     Allergies  Allergen Reactions  . Codeine   . Omnipaque (Iohexol)   . Celebrex (Celecoxib) Rash  . Iodine Rash    Current outpatient prescriptions:allopurinol (ZYLOPRIM) 100 MG tablet, Take 100 mg by mouth. Take 2 tabs daily to equal 200 mg,  Disp: , Rfl: ;  amLODipine (NORVASC) 5 MG tablet, Take 5 mg by mouth daily.  , Disp: , Rfl: ;  aspirin 81 MG tablet, Take 81 mg by mouth daily.  , Disp: , Rfl: ;  b complex-vitamin c-folic acid (NEPHRO-VITE) 0.8 MG TABS, Take 0.8 mg by mouth at bedtime.  , Disp: , Rfl:  buPROPion (WELLBUTRIN SR) 150 MG 12 hr tablet, Take 150 mg by mouth daily.  , Disp: , Rfl: ;  cloNIDine (CATAPRES) 0.1 MG tablet, Take 0.1 mg by mouth 2 (two) times daily.  , Disp: , Rfl: ;  clotrimazole (LOTRIMIN) 1 % external solution, Apply topically as needed. Apply to fungal rash on foot , Disp: , Rfl: ;  colchicine 0.6 MG tablet, Take 0.6 mg by mouth daily. 1 tablet once or twice a day as needed for acute gout flare , Disp: , Rfl:  docusate sodium (COLACE) 100 MG capsule, Take 100 mg by mouth. Take 2 tabs daily , Disp: , Rfl: ;  econazole nitrate 1 % cream, Apply 1 application topically 2 (two) times daily as needed. , Disp: , Rfl: ;  ezetimibe (ZETIA) 10 MG tablet, Take 10 mg by mouth daily.  , Disp: , Rfl: ;  famotidine (PEPCID) 20 MG tablet, Take 20 mg by mouth 2 (two) times daily.  , Disp: , Rfl:  ferric subsulfate (MONSEL'S) solution, Apply topically once.  , Disp: , Rfl: ;  Filgrastim (NEUPOGEN IJ), Inject as directed every 30 (thirty) days.  , Disp: , Rfl: ;  furosemide (LASIX) 40 MG tablet, Take 40 mg by mouth. Take 1/2 tab two times/day, Disp: , Rfl: ;  hydrALAZINE (APRESOLINE) 50 MG tablet, Take 50 mg by mouth. 1 1/2 tablet orally three times a day, Disp: , Rfl:  isosorbide dinitrate (ISORDIL) 40 MG tablet, Take 40 mg by mouth 2 (two) times daily at 10 am and 4 pm.  , Disp: , Rfl: ;  mupirocin (BACTROBAN) 2 % ointment, Apply 1 application topically 2 (two) times daily as needed. , Disp: , Rfl: ;  oxyCODONE-acetaminophen (PERCOCET) 7.5-325 MG per tablet, Take 1 tablet by mouth every 6 (six) hours as needed.  , Disp: , Rfl:  Polysaccharide Iron Complex (FERREX 150 PO), Take 1 tablet by mouth daily.  , Disp: , Rfl: ;  triamcinolone  (KENALOG) 0.1 % cream, Apply topically 2 (two) times daily. Sparingly ro affected area as needed BID , Disp: , Rfl: ;  glyBURIDE micronized (GLYNASE) 6 MG tablet, Take 6 mg by mouth daily with breakfast.  , Disp: , Rfl:  HYDROcodone-acetaminophen (NORCO) 5-325 MG per tablet, Take 1 tablet by mouth every 6 (six) hours as needed. 1-2 tablets every 6 hours as needed for pain , Disp: , Rfl: ;  oxycodone (OXY-IR) 5 MG capsule, Take 5 mg by mouth every 4 (four) hours as needed.  , Disp: , Rfl: ;  polyethylene glycol (MIRALAX / GLYCOLAX) packet, Take 17 g by mouth daily.  , Disp: , Rfl: ;  sodium chloride 0.9 % irrigation, Irrigate with as directed once.  , Disp: , Rfl:  Vitamin D, Ergocalciferol, (DRISDOL) 50000 UNITS CAPS, Take 50,000 Units by mouth. 1 capsule once a month  , Disp: , Rfl:   BP 150/59  Pulse 69  Resp 20  Ht 5' (1.524 m)  There is no weight on file to calculate BMI.       Review of Systems     Objective:   Physical Exam blood pressure 150/59 heart rate 69 respirations 20 She is an elderly chronically ill-appearing female complaining of discomfort in her right lower leg Chest clear to auscultation no rhonchi or wheezing Lower extremity exam reveals 3+ femoral pulse on the right no popliteal or distal pulses palpable. She has an extensive area of dry gangrene with no purulent drainage or cellulitis along the medial aspect of the right leg beginning above the medial malleolus extending two thirds of the way up to the knee. She has a known ABI of 0.4-0.5 range the right leg.     Assessment:    extensive dry gangrene skin right lower extremity with known iliac and femoral popliteal occlusive disease    Plan:    suspect patient will need right above-knee dictation in the near future for pain control. Discussed this with patient and family today. Patient will return next week as scheduled to see Dr. Myra Gianotti. No evidence of contained abscess or cellulitis which needs immediate  attention.

## 2011-07-21 ENCOUNTER — Encounter: Payer: Self-pay | Admitting: Surgery

## 2011-07-24 ENCOUNTER — Ambulatory Visit (INDEPENDENT_AMBULATORY_CARE_PROVIDER_SITE_OTHER): Payer: Medicare Other | Admitting: Surgery

## 2011-07-24 ENCOUNTER — Encounter: Payer: Self-pay | Admitting: Surgery

## 2011-07-24 VITALS — BP 146/69 | HR 75 | Temp 98.2°F | Ht 62.0 in

## 2011-07-24 DIAGNOSIS — L98499 Non-pressure chronic ulcer of skin of other sites with unspecified severity: Secondary | ICD-10-CM

## 2011-07-24 DIAGNOSIS — I739 Peripheral vascular disease, unspecified: Secondary | ICD-10-CM

## 2011-07-24 DIAGNOSIS — I70269 Atherosclerosis of native arteries of extremities with gangrene, unspecified extremity: Secondary | ICD-10-CM

## 2011-07-24 DIAGNOSIS — E1159 Type 2 diabetes mellitus with other circulatory complications: Secondary | ICD-10-CM

## 2011-07-24 NOTE — Progress Notes (Signed)
[facility]   Patient name: [name] MRN: [id number] DOB: 03-01-1927 Sex: female     Reason for referral:  Chief Complaint  Patient presents with  . PVD    Pt has increased pain in her RLE, she still has large medial calf wound.  Marland Kitchen Ulcer    HISTORY OF PRESENT ILLNESS: Please see note from 07/03/2011 for full details. The patient comes back today for further evaluation of her right leg wound. She still complains of pain there is drainage and odor associated with the wound. It has increased in size and changed in color.  Past Medical History  Diagnosis Date  . DM (diabetes mellitus) type II controlled peripheral vascular disorder   . HTN (hypertension)   . CHF (congestive heart failure)   . CKD (chronic kidney disease)   . CAD (coronary artery disease)   . COPD (chronic obstructive pulmonary disease)   . Cataracts, bilateral   . Diabetes mellitus   . Hyperlipidemia   . CAD (coronary artery disease)   . S/P femoral-popliteal bypass surgery bilateral  . Glaucoma   . TIA (transient ischemic attack)   . Chronic kidney disease     Past Surgical History  Procedure Date  . Coronary artery bypass graft   . Spine surgery   . Eye surgery cataract  . Carpal tunnel release left  . Breast surgery lumpectomy right  . Aortogram w/ runoff 06/06/11    right leg    History   Social History  . Marital Status: Widowed    Spouse Name: N/A    Number of Children: N/A  . Years of Education: N/A   Occupational History  . Not on file.   Social History Main Topics  . Smoking status: Former Smoker -- 1.0 packs/day for 40 years    Types: Cigarettes    Quit date: 04/13/1977  . Smokeless tobacco: Never Used  . Alcohol Use: No  . Drug Use: No  . Sexually Active: Not on file   Other Topics Concern  . Not on file   Social History Narrative  . No narrative on file    Family History  Problem Relation Age of Onset  . Hypertension Mother   .  Peripheral vascular disease Mother   . Diabetes Father   . Kidney disease Sister   . Cancer Brother     lung  . Lung disease Sister   . Lung disease Sister     Allergies as of 07/24/2011 - Review Complete 07/24/2011  Allergen Reaction Noted  . Codeine  06/19/2011  . Omnipaque (iohexol)  06/19/2011  . Celebrex (celecoxib) Rash 05/31/2011  . Iodine Rash 05/31/2011    Current Outpatient Prescriptions on File Prior to Visit  Medication Sig Dispense Refill  . allopurinol (ZYLOPRIM) 100 MG tablet Take 100 mg by mouth. Take 2 tabs daily to equal 200 mg      . amLODipine (NORVASC) 5 MG tablet Take 5 mg by mouth daily.        Marland Kitchen aspirin 81 MG tablet Take 81 mg by mouth daily.        Marland Kitchen b complex-vitamin c-folic acid (NEPHRO-VITE) 0.8 MG TABS Take 0.8 mg by mouth at bedtime.        Marland Kitchen buPROPion (WELLBUTRIN SR) 150 MG 12 hr tablet Take 150 mg by mouth daily.        . cloNIDine (CATAPRES) 0.1 MG tablet Take 0.1 mg by mouth 2 (two) times daily.        Marland Kitchen  clotrimazole (LOTRIMIN) 1 % external solution Apply topically as needed. Apply to fungal rash on foot       . colchicine 0.6 MG tablet Take 0.6 mg by mouth daily. 1 tablet once or twice a day as needed for acute gout flare       . docusate sodium (COLACE) 100 MG capsule Take 100 mg by mouth. Take 2 tabs daily       . econazole nitrate 1 % cream Apply 1 application topically 2 (two) times daily as needed.       . ezetimibe (ZETIA) 10 MG tablet Take 10 mg by mouth daily.        . famotidine (PEPCID) 20 MG tablet Take 20 mg by mouth 2 (two) times daily.        . ferric subsulfate (MONSEL'S) solution Apply topically once.        . Filgrastim (NEUPOGEN IJ) Inject as directed every 30 (thirty) days.        . furosemide (LASIX) 40 MG tablet Take 40 mg by mouth. Take 1/2 tab two times/day      . hydrALAZINE (APRESOLINE) 50 MG tablet Take 50 mg by mouth. 1 1/2 tablet orally three times a day      . isosorbide dinitrate (ISORDIL) 40 MG tablet Take 40 mg by  mouth 2 (two) times daily at 10 am and 4 pm.        . mupirocin (BACTROBAN) 2 % ointment Apply 1 application topically 2 (two) times daily as needed.       Marland Kitchen oxyCODONE-acetaminophen (PERCOCET) 7.5-325 MG per tablet Take 1 tablet by mouth every 6 (six) hours as needed.        . polyethylene glycol (MIRALAX / GLYCOLAX) packet Take 17 g by mouth daily.        . Polysaccharide Iron Complex (FERREX 150 PO) Take 1 tablet by mouth daily.        . sodium chloride 0.9 % irrigation Irrigate with as directed once.        . triamcinolone (KENALOG) 0.1 % cream Apply topically 2 (two) times daily. Sparingly ro affected area as needed BID       . glyBURIDE micronized (GLYNASE) 6 MG tablet Take 6 mg by mouth daily with breakfast.        . HYDROcodone-acetaminophen (NORCO) 5-325 MG per tablet Take 1 tablet by mouth every 6 (six) hours as needed. 1-2 tablets every 6 hours as needed for pain       . oxycodone (OXY-IR) 5 MG capsule Take 5 mg by mouth every 4 (four) hours as needed.        . Vitamin D, Ergocalciferol, (DRISDOL) 50000 UNITS CAPS Take 50,000 Units by mouth. 1 capsule once a month           REVIEW OF SYSTEMS: Cardiovascular: No chest pain, chest pressure, palpitations, orthopnea, or dyspnea on exertion. No claudication or rest pain,  No history of DVT or phlebitis. Pulmonary: No productive cough, asthma or wheezing. Neurologic: No weakness, paresthesias, aphasia, or amaurosis. No dizziness. Hematologic: No bleeding problems or clotting disorders. Musculoskeletal: No joint pain or joint swelling. Gastrointestinal: No blood in stool or hematemesis Genitourinary: No dysuria or hematuria. Psychiatric:: No history of major depression. Integumentary: No rashes or ulcers. Constitutional: No fever or chills.  PHYSICAL EXAMINATION: General: The patient appears their stated age.  Vital signs are BP 146/69  Pulse 75  Temp(Src) 98.2 F (36.8 C) (Oral)  Ht 5\' 2"  (1.575 m) Pulmonary:  There is a good air  exchange bilaterally without wheezing or rales. Abdomen: Soft and non-tender with normal pitch bowel sounds. Musculoskeletal: Extensive soft tissue necrosis of the right medial lower leg with drainage and foul odor Neurologic: No focal weakness or paresthesias are detected, Skin: There are no ulcer or rashes noted. Psychiatric: The patient has normal affect. Cardiovascular: There is a regular rate and rhythm without significant murmur appreciated.   Outside Studies/Documentation Historical records were reviewed  Medical Decision Making  LORAN AUGUSTE is a 77 y.o. female who presents with:  Chief Complaint  Patient presents with  . PVD    Pt has increased pain in her RLE, she still has large medial calf wound.  Marland Kitchen Ulcer   Medication changes include: We have been following his wound for several weeks. I do not believe that the patient would benefit from revascularization. She was also deemed to be very high risk by cardiology to proceed with revascularization. She has a history of 2 prior bypasses by Dr. Edwyna Shell which have ultimately failed. I attempted to intervene in the catheter lab however this also was unsuccessful. We have been trying to manage her with wound care however I do believe this has failed. I see no other options but proceed with an above-knee amputation. Patient and family are in agreement with this visit is scheduled for this Thursday, September 13   [facility] [place] Office: [phone]

## 2011-07-25 ENCOUNTER — Encounter (HOSPITAL_COMMUNITY)
Admission: RE | Admit: 2011-07-25 | Discharge: 2011-07-25 | Disposition: A | Discharge status: Home / Self Care | Payer: Medicare Other | Source: Ambulatory Visit | Attending: Surgery | Admitting: Surgery

## 2011-07-25 LAB — BASIC METABOLIC PANEL
CO2: 25 mEq/L (ref 19–32)
Chloride: 100 mEq/L (ref 96–112)
Creatinine, Ser: 1.59 mg/dL — ABNORMAL HIGH (ref 0.50–1.10)
Potassium: 4.8 mEq/L (ref 3.5–5.1)

## 2011-07-25 LAB — CBC
Hemoglobin: 9.4 g/dL — ABNORMAL LOW (ref 12.0–15.0)
MCH: 22.5 pg — ABNORMAL LOW (ref 26.0–34.0)
MCHC: 32.6 g/dL (ref 30.0–36.0)
RDW: 15.4 % (ref 11.5–15.5)

## 2011-07-25 LAB — SURGICAL PCR SCREEN
MRSA, PCR: NEGATIVE
Staphylococcus aureus: NEGATIVE

## 2011-07-27 ENCOUNTER — Inpatient Hospital Stay (HOSPITAL_COMMUNITY)
Admission: RE | Admit: 2011-07-27 | Discharge: 2011-08-01 | DRG: 240 | Disposition: A | Discharge status: Rehabilitation Facility | Payer: Medicare Other | Source: Ambulatory Visit | Attending: Surgery | Admitting: Surgery

## 2011-07-27 ENCOUNTER — Other Ambulatory Visit: Payer: Self-pay | Admitting: Surgery

## 2011-07-27 DIAGNOSIS — H269 Unspecified cataract: Secondary | ICD-10-CM | POA: Diagnosis present

## 2011-07-27 DIAGNOSIS — Z7901 Long term (current) use of anticoagulants: Secondary | ICD-10-CM

## 2011-07-27 DIAGNOSIS — D62 Acute posthemorrhagic anemia: Secondary | ICD-10-CM | POA: Diagnosis not present

## 2011-07-27 DIAGNOSIS — Z8673 Personal history of transient ischemic attack (TIA), and cerebral infarction without residual deficits: Secondary | ICD-10-CM

## 2011-07-27 DIAGNOSIS — Z7982 Long term (current) use of aspirin: Secondary | ICD-10-CM

## 2011-07-27 DIAGNOSIS — Z951 Presence of aortocoronary bypass graft: Secondary | ICD-10-CM

## 2011-07-27 DIAGNOSIS — I129 Hypertensive chronic kidney disease with stage 1 through stage 4 chronic kidney disease, or unspecified chronic kidney disease: Secondary | ICD-10-CM | POA: Diagnosis present

## 2011-07-27 DIAGNOSIS — I70409 Unspecified atherosclerosis of autologous vein bypass graft(s) of the extremities, unspecified extremity: Principal | ICD-10-CM | POA: Diagnosis present

## 2011-07-27 DIAGNOSIS — I2510 Atherosclerotic heart disease of native coronary artery without angina pectoris: Secondary | ICD-10-CM | POA: Diagnosis present

## 2011-07-27 DIAGNOSIS — J4489 Other specified chronic obstructive pulmonary disease: Secondary | ICD-10-CM | POA: Diagnosis present

## 2011-07-27 DIAGNOSIS — I70209 Unspecified atherosclerosis of native arteries of extremities, unspecified extremity: Secondary | ICD-10-CM | POA: Diagnosis present

## 2011-07-27 DIAGNOSIS — E785 Hyperlipidemia, unspecified: Secondary | ICD-10-CM | POA: Diagnosis present

## 2011-07-27 DIAGNOSIS — H409 Unspecified glaucoma: Secondary | ICD-10-CM | POA: Diagnosis present

## 2011-07-27 DIAGNOSIS — I7092 Chronic total occlusion of artery of the extremities: Secondary | ICD-10-CM | POA: Diagnosis present

## 2011-07-27 DIAGNOSIS — E119 Type 2 diabetes mellitus without complications: Secondary | ICD-10-CM | POA: Diagnosis present

## 2011-07-27 DIAGNOSIS — N189 Chronic kidney disease, unspecified: Secondary | ICD-10-CM | POA: Diagnosis present

## 2011-07-27 DIAGNOSIS — Z79899 Other long term (current) drug therapy: Secondary | ICD-10-CM

## 2011-07-27 DIAGNOSIS — J449 Chronic obstructive pulmonary disease, unspecified: Secondary | ICD-10-CM | POA: Diagnosis present

## 2011-07-27 DIAGNOSIS — L98499 Non-pressure chronic ulcer of skin of other sites with unspecified severity: Secondary | ICD-10-CM

## 2011-07-27 DIAGNOSIS — I509 Heart failure, unspecified: Secondary | ICD-10-CM | POA: Diagnosis present

## 2011-07-27 DIAGNOSIS — I739 Peripheral vascular disease, unspecified: Secondary | ICD-10-CM

## 2011-07-27 DIAGNOSIS — Z01812 Encounter for preprocedural laboratory examination: Secondary | ICD-10-CM

## 2011-07-27 HISTORY — PX: ABOVE KNEE LEG AMPUTATION: SUR20

## 2011-07-27 LAB — GLUCOSE, CAPILLARY
Glucose-Capillary: 165 mg/dL — ABNORMAL HIGH (ref 70–99)
Glucose-Capillary: 176 mg/dL — ABNORMAL HIGH (ref 70–99)
Glucose-Capillary: 195 mg/dL — ABNORMAL HIGH (ref 70–99)

## 2011-07-28 DIAGNOSIS — L98499 Non-pressure chronic ulcer of skin of other sites with unspecified severity: Secondary | ICD-10-CM

## 2011-07-28 DIAGNOSIS — S78119A Complete traumatic amputation at level between unspecified hip and knee, initial encounter: Secondary | ICD-10-CM

## 2011-07-28 DIAGNOSIS — I739 Peripheral vascular disease, unspecified: Secondary | ICD-10-CM

## 2011-07-28 LAB — BASIC METABOLIC PANEL
BUN: 31 mg/dL — ABNORMAL HIGH (ref 6–23)
CO2: 25 mEq/L (ref 19–32)
Calcium: 9 mg/dL (ref 8.4–10.5)
Chloride: 101 mEq/L (ref 96–112)
Creatinine, Ser: 1.51 mg/dL — ABNORMAL HIGH (ref 0.50–1.10)
Glucose, Bld: 190 mg/dL — ABNORMAL HIGH (ref 70–99)

## 2011-07-28 LAB — GLUCOSE, CAPILLARY: Glucose-Capillary: 164 mg/dL — ABNORMAL HIGH (ref 70–99)

## 2011-07-28 LAB — CBC
HCT: 22.9 % — ABNORMAL LOW (ref 36.0–46.0)
Hemoglobin: 7.6 g/dL — ABNORMAL LOW (ref 12.0–15.0)
MCH: 22.8 pg — ABNORMAL LOW (ref 26.0–34.0)
MCV: 68.8 fL — ABNORMAL LOW (ref 78.0–100.0)
RBC: 3.33 MIL/uL — ABNORMAL LOW (ref 3.87–5.11)
WBC: 10.3 10*3/uL (ref 4.0–10.5)

## 2011-07-28 LAB — HEMOGLOBIN A1C
Hgb A1c MFr Bld: 7.2 % — ABNORMAL HIGH (ref <5.7)
Mean Plasma Glucose: 160 mg/dL — ABNORMAL HIGH (ref <117)

## 2011-07-29 LAB — CROSSMATCH: Antibody Screen: NEGATIVE

## 2011-07-29 LAB — CBC
Hemoglobin: 9.7 g/dL — ABNORMAL LOW (ref 12.0–15.0)
MCH: 24.4 pg — ABNORMAL LOW (ref 26.0–34.0)
MCV: 71.3 fL — ABNORMAL LOW (ref 78.0–100.0)
Platelets: 240 10*3/uL (ref 150–400)
RBC: 3.97 MIL/uL (ref 3.87–5.11)
WBC: 8.9 10*3/uL (ref 4.0–10.5)

## 2011-07-29 LAB — BASIC METABOLIC PANEL
CO2: 25 mEq/L (ref 19–32)
Calcium: 8.7 mg/dL (ref 8.4–10.5)
Chloride: 99 mEq/L (ref 96–112)
Glucose, Bld: 115 mg/dL — ABNORMAL HIGH (ref 70–99)
Sodium: 135 mEq/L (ref 135–145)

## 2011-07-29 LAB — GLUCOSE, CAPILLARY
Glucose-Capillary: 206 mg/dL — ABNORMAL HIGH (ref 70–99)
Glucose-Capillary: 186 mg/dL — ABNORMAL HIGH (ref 70–99)

## 2011-07-30 LAB — GLUCOSE, CAPILLARY
Glucose-Capillary: 131 mg/dL — ABNORMAL HIGH (ref 70–99)
Glucose-Capillary: 181 mg/dL — ABNORMAL HIGH (ref 70–99)
Glucose-Capillary: 194 mg/dL — ABNORMAL HIGH (ref 70–99)

## 2011-07-31 LAB — GLUCOSE, CAPILLARY
Glucose-Capillary: 151 mg/dL — ABNORMAL HIGH (ref 70–99)
Glucose-Capillary: 234 mg/dL — ABNORMAL HIGH (ref 70–99)
Glucose-Capillary: 183 mg/dL — ABNORMAL HIGH (ref 70–99)

## 2011-08-01 ENCOUNTER — Inpatient Hospital Stay (HOSPITAL_COMMUNITY)
Admission: RE | Admit: 2011-08-01 | Discharge: 2011-08-15 | DRG: 945 | Disposition: A | Discharge status: Home Health | Payer: Medicare Other | Source: Other Acute Inpatient Hospital | Attending: Physical Medicine & Rehabilitation | Admitting: Physical Medicine & Rehabilitation

## 2011-08-01 DIAGNOSIS — Z87891 Personal history of nicotine dependence: Secondary | ICD-10-CM

## 2011-08-01 DIAGNOSIS — J4489 Other specified chronic obstructive pulmonary disease: Secondary | ICD-10-CM

## 2011-08-01 DIAGNOSIS — Z5189 Encounter for other specified aftercare: Principal | ICD-10-CM

## 2011-08-01 DIAGNOSIS — S78119A Complete traumatic amputation at level between unspecified hip and knee, initial encounter: Secondary | ICD-10-CM

## 2011-08-01 DIAGNOSIS — N319 Neuromuscular dysfunction of bladder, unspecified: Secondary | ICD-10-CM

## 2011-08-01 DIAGNOSIS — D62 Acute posthemorrhagic anemia: Secondary | ICD-10-CM

## 2011-08-01 DIAGNOSIS — M109 Gout, unspecified: Secondary | ICD-10-CM

## 2011-08-01 DIAGNOSIS — R630 Anorexia: Secondary | ICD-10-CM

## 2011-08-01 DIAGNOSIS — I70409 Unspecified atherosclerosis of autologous vein bypass graft(s) of the extremities, unspecified extremity: Secondary | ICD-10-CM

## 2011-08-01 DIAGNOSIS — R5381 Other malaise: Secondary | ICD-10-CM

## 2011-08-01 DIAGNOSIS — Z79899 Other long term (current) drug therapy: Secondary | ICD-10-CM

## 2011-08-01 DIAGNOSIS — Z8673 Personal history of transient ischemic attack (TIA), and cerebral infarction without residual deficits: Secondary | ICD-10-CM

## 2011-08-01 DIAGNOSIS — Z853 Personal history of malignant neoplasm of breast: Secondary | ICD-10-CM

## 2011-08-01 DIAGNOSIS — I70209 Unspecified atherosclerosis of native arteries of extremities, unspecified extremity: Secondary | ICD-10-CM

## 2011-08-01 DIAGNOSIS — N189 Chronic kidney disease, unspecified: Secondary | ICD-10-CM

## 2011-08-01 DIAGNOSIS — E785 Hyperlipidemia, unspecified: Secondary | ICD-10-CM

## 2011-08-01 DIAGNOSIS — R339 Retention of urine, unspecified: Secondary | ICD-10-CM

## 2011-08-01 DIAGNOSIS — H269 Unspecified cataract: Secondary | ICD-10-CM

## 2011-08-01 DIAGNOSIS — H409 Unspecified glaucoma: Secondary | ICD-10-CM

## 2011-08-01 DIAGNOSIS — I509 Heart failure, unspecified: Secondary | ICD-10-CM

## 2011-08-01 DIAGNOSIS — J9819 Other pulmonary collapse: Secondary | ICD-10-CM

## 2011-08-01 DIAGNOSIS — K5900 Constipation, unspecified: Secondary | ICD-10-CM

## 2011-08-01 DIAGNOSIS — Z9010 Acquired absence of unspecified breast and nipple: Secondary | ICD-10-CM

## 2011-08-01 DIAGNOSIS — J449 Chronic obstructive pulmonary disease, unspecified: Secondary | ICD-10-CM

## 2011-08-01 DIAGNOSIS — I2510 Atherosclerotic heart disease of native coronary artery without angina pectoris: Secondary | ICD-10-CM

## 2011-08-01 DIAGNOSIS — I70269 Atherosclerosis of native arteries of extremities with gangrene, unspecified extremity: Secondary | ICD-10-CM

## 2011-08-01 DIAGNOSIS — E119 Type 2 diabetes mellitus without complications: Secondary | ICD-10-CM

## 2011-08-01 DIAGNOSIS — Z7982 Long term (current) use of aspirin: Secondary | ICD-10-CM

## 2011-08-01 DIAGNOSIS — I129 Hypertensive chronic kidney disease with stage 1 through stage 4 chronic kidney disease, or unspecified chronic kidney disease: Secondary | ICD-10-CM

## 2011-08-01 DIAGNOSIS — Z888 Allergy status to other drugs, medicaments and biological substances status: Secondary | ICD-10-CM

## 2011-08-01 DIAGNOSIS — Z951 Presence of aortocoronary bypass graft: Secondary | ICD-10-CM

## 2011-08-01 LAB — GLUCOSE, CAPILLARY
Glucose-Capillary: 168 mg/dL — ABNORMAL HIGH (ref 70–99)
Glucose-Capillary: 200 mg/dL — ABNORMAL HIGH (ref 70–99)

## 2011-08-02 LAB — GLUCOSE, CAPILLARY
Glucose-Capillary: 150 mg/dL — ABNORMAL HIGH (ref 70–99)
Glucose-Capillary: 230 mg/dL — ABNORMAL HIGH (ref 70–99)
Glucose-Capillary: 208 mg/dL — ABNORMAL HIGH (ref 70–99)

## 2011-08-02 LAB — COMPREHENSIVE METABOLIC PANEL
ALT: 29 U/L (ref 0–35)
AST: 34 U/L (ref 0–37)
Albumin: 2.4 g/dL — ABNORMAL LOW (ref 3.5–5.2)
Calcium: 9.2 mg/dL (ref 8.4–10.5)
Chloride: 102 mEq/L (ref 96–112)
Creatinine, Ser: 1.34 mg/dL — ABNORMAL HIGH (ref 0.50–1.10)
Sodium: 140 mEq/L (ref 135–145)

## 2011-08-02 LAB — DIFFERENTIAL
Basophils Relative: 0 % (ref 0–1)
Eosinophils Absolute: 0.3 10*3/uL (ref 0.0–0.7)
Eosinophils Relative: 5 % (ref 0–5)
Lymphs Abs: 1.3 10*3/uL (ref 0.7–4.0)
Neutrophils Relative %: 68 % (ref 43–77)

## 2011-08-02 LAB — CBC
MCH: 24.1 pg — ABNORMAL LOW (ref 26.0–34.0)
MCV: 72.7 fL — ABNORMAL LOW (ref 78.0–100.0)
Platelets: 301 10*3/uL (ref 150–400)
RBC: 4.03 MIL/uL (ref 3.87–5.11)
RDW: 18.2 % — ABNORMAL HIGH (ref 11.5–15.5)
WBC: 6.7 10*3/uL (ref 4.0–10.5)

## 2011-08-03 DIAGNOSIS — I70269 Atherosclerosis of native arteries of extremities with gangrene, unspecified extremity: Secondary | ICD-10-CM

## 2011-08-03 DIAGNOSIS — S78119A Complete traumatic amputation at level between unspecified hip and knee, initial encounter: Secondary | ICD-10-CM

## 2011-08-03 LAB — GLUCOSE, CAPILLARY
Glucose-Capillary: 228 mg/dL — ABNORMAL HIGH (ref 70–99)
Glucose-Capillary: 103 mg/dL — ABNORMAL HIGH (ref 70–99)
Glucose-Capillary: 136 mg/dL — ABNORMAL HIGH (ref 70–99)

## 2011-08-03 NOTE — H&P (Signed)
NAMEGRAYCE, [name]                ACCOUNT NO.:  [id number]  MEDICAL RECORD NO.:  [id number]  LOCATION:  4032                         FACILITY:  [facility]  PHYSICIAN:  Ranelle Oyster, M.D.DATE OF BIRTH:  July 17, 1927  DATE OF ADMISSION:  08/01/2011 DATE OF DISCHARGE:                             HISTORY & PHYSICAL   CHIEF COMPLAINT:  Right leg pain.  SURGEON: 1. Durene Cal IV, MD  HISTORY OF PRESENT ILLNESS:  This is an 77 year old African American female with diabetes and nonhealing right leg wound who was admitted on July 27, 2011, for right above-knee amputation after failing conservative measures.  Postoperatively, she has anemia with 7.6, was transfused 2 units.  P.o. intake was poor and she was placed on Marinol for appetite stimulation.  Therapies were initiated and the patient will need help with transfers and basic mobility.  We has been following along with this patient and felt she could benefit from an inpatient stay.  REVIEW OF SYSTEMS:  Notable for weakness, occasional cough, wound care issues, pain which is 5/10 in the right leg.  Full 12-point review is in the written H and P.  PAST MEDICAL HISTORY:  Positive for: 1. Type 2 diabetes. 2. CKD. 3. CHF. 4. CAD with CABG in 2001. 5. COPD. 6. Cataracts bilaterally. 7. History of bilateral fem-pop bypass graft. 8. TIA. 9. Gout. 10.Glaucoma. 11.Left carpal tunnel release. 12.Right partial mastectomy for cancer.  FAMILY HISTORY:  Positive for peripheral vascular disease and history of amputation.  SOCIAL HISTORY:  The patient lives alone, level house.  Family can assist at discharge apparently.  ALLERGIES:  Codeine, Celebrex, Lamisil, codeine, Zocor, Lipitor, Avandia, Relafen, and Crestor.  HOME MEDICATIONS:  Please see written H and P.  LABORATORY STUDIES:  Please see written H and P.  PHYSICAL EXAMINATION:  VITAL SIGNS:  Blood pressure is 179/71, pulse 63, respiratory rate 18, temperature  98.6. GENERAL:  The patient is pleasant, alert and oriented x3. HEENT:  Pupils are equally round and reactive to light.  Ear, nose, and throat exams are notable for full dentures.  Mucosa is pink and moist. NECK:  Supple without JVD or lymphadenopathy. CHEST:  Clear to auscultation bilaterally without wheezes, rales, or rhonchi. HEART:  Regular rate and rhythm without murmurs, rubs, or gallops. ABDOMEN:  Soft, nontender.  Bowel sounds are positive. SKIN:  Notable for right AKA incision, which was clean and intact with staples with only minimal serosanguineous discharge.  NEUROLOGIC: Neurologically, cranial nerves II through XII are notable for reasonable vision and movement in the mouth.  All other nerves are within normal limits.  Sensation; slightly diminished right lower extremity and stocking-glove distribution quickly over the foot.  Reflexes are 1+. Skin is dry in the left lower extremity and nail beds are onychomycotic particularly the first two toes.  Judgment, orientation, memory, and mood are all within normal limits.  Strength is 4-5/5 in the upper extremities, right more than left.  She had some limitation over the left shoulder movements due to some chronic pain there.  Left lower extremity is grossly 3-4/5 proximal to 4/5 distally.  She is able to lift the right leg at the hip at approximately  2/5 strength.  POST ADMISSION PHYSICIAN EVALUATION: 1. Functional deficit secondary to right above-knee amputation and     morbid obesity. 2. The patient was admitted to receive collaborative interdisciplinary     care between the physiatrist, rehab nursing staff, and therapy     team. 3. The patient's level of medical complexity and substantial therapy     needs in context of that medical necessity cannot be provided at a     lesser intensity of care. 4. The patient has experienced substantial functional loss from her     baseline.  Premorbidly, she was independent, but sedentary  needing     assistance with ADLs.  Currently, she is mod-assist bed mobility     50%, mod-assist transfer, mod-assist upper body care, and max-     assist lower body care.  Judging by the patient's diagnosis,     physical exam, and functional history, she has the potential for     functional progress which will result in measurable gains while in     inpatient rehab.  These gains will be of substantial and practical     use upon discharge to home in facilitating mobility and self-care. 5. The physiatrist will provide 24-hour management of medical needs as     well as oversight of the therapy plan/treatment and provide     guidance as appropriate regarding interaction of the two.  Medical     problem list and plan are below. 6. A 24-hour rehab nursing team will assist in the management of the     patient's skin care needs as well as bowel and bladder function,     safety awareness, integration of therapy concepts and techniques. 7. PT will assess and treat for lower extremity strength, range of     motion, functional mobility, safety, wheelchair mobility, and     transfers with goals supervision to min-assist. 8. OT will assess and treat for upper extremity use, ADLs, adaptive     techniques, equipment, functional mobility, safety, goals modified     independent to min-assist. 9. Case management and social worker will assess and treat for     psychosocial issues and discharge planning. 10.Team conference will be held weekly to assess progress towards     goals and to determine barriers at discharge. 11.The patient has demonstrated sufficient medical stability and     exercise capacity to tolerate at least 3 hours of therapy per day     at least 5 days per week. 12.Estimated length of stay is 2 weeks.  Prognosis is good.  The     patient is fairly motivated.  MEDICAL PROBLEM LIST AND PLAN: 1. Deep venous thrombosis prophylaxis with subcu Lovenox.  Check     platelets and for any signs  and symptoms of bleeding and     complications.  Most recent hemoglobin is 9.7 after transfusion. 2. Pain management with p.r.n. oxycodone.  This appears generally be     effective.  The patient denies phantom limb pain at this point. 3. Type 2 diabetes:  The patient is off glyburide due to poor p.o.     intake.  We will tightly control CBCs to rule out better wound     healing.  Marinol has been added also to improve appetite,     consistency. 4. Chronic kidney disease:  Continue Nephro-Vite.  We will check     creatinine on a serial basis.  Most recent creatinine is 1.69.  Check admission labs in the morning. 5. Hypertension:  B.i.d. blood pressure will recheck.  We will adjust     for better control.  Blood pressure has been variable at times and     may depend on pain levels certainly. 6. Acute on chronic anemia:  Serial hemoglobin and hematocrit.  Follow     as above. 7. History of gout. 8. History of coronary artery disease:  Isordil, Zetia and aspirin.     Monitor for shortness of breath, chest pain with activity.  The     patient denies any symptoms on examination today and heart was     regular. 9. History of congestive heart failure:  Check weights on Monday,     Wednesday, Friday.  Follow I's and O's and the patient is on a low     salt restriction now for diet.  Continue Lasix, hydralazine, and     Isordil for management of this. 10.Constipation:  Increased MiraLax to b.i.d. 11.Cough:  Likely some mild atelectasis and mobilization of     secretions.  Encourage incentive spirometry.     Ranelle Oyster, M.D.     ZTS/MEDQ  D:  08/01/2011  T:  08/02/2011  Job:  [id number]  cc:   Jorge Ny, MD  Electronically Signed by Faith Rogue M.D. on 08/03/2011 10:16:01 AM

## 2011-08-04 LAB — GLUCOSE, CAPILLARY
Glucose-Capillary: 166 mg/dL — ABNORMAL HIGH (ref 70–99)
Glucose-Capillary: 170 mg/dL — ABNORMAL HIGH (ref 70–99)

## 2011-08-05 LAB — GLUCOSE, CAPILLARY
Glucose-Capillary: 197 mg/dL — ABNORMAL HIGH (ref 70–99)
Glucose-Capillary: 112 mg/dL — ABNORMAL HIGH (ref 70–99)
Glucose-Capillary: 149 mg/dL — ABNORMAL HIGH (ref 70–99)

## 2011-08-06 LAB — GLUCOSE, CAPILLARY: Glucose-Capillary: 205 mg/dL — ABNORMAL HIGH (ref 70–99)

## 2011-08-07 LAB — GLUCOSE, CAPILLARY
Glucose-Capillary: 141 mg/dL — ABNORMAL HIGH (ref 70–99)
Glucose-Capillary: 129 mg/dL — ABNORMAL HIGH (ref 70–99)

## 2011-08-08 LAB — GLUCOSE, CAPILLARY
Glucose-Capillary: 184 mg/dL — ABNORMAL HIGH (ref 70–99)
Glucose-Capillary: 114 mg/dL — ABNORMAL HIGH (ref 70–99)

## 2011-08-09 LAB — GLUCOSE, CAPILLARY: Glucose-Capillary: 135 mg/dL — ABNORMAL HIGH (ref 70–99)

## 2011-08-09 LAB — BASIC METABOLIC PANEL
BUN: 37 mg/dL — ABNORMAL HIGH (ref 6–23)
GFR calc non Af Amer: 35 mL/min — ABNORMAL LOW (ref >60)
Glucose, Bld: 130 mg/dL — ABNORMAL HIGH (ref 70–99)
Potassium: 4.2 mEq/L (ref 3.5–5.1)

## 2011-08-10 DIAGNOSIS — S78119A Complete traumatic amputation at level between unspecified hip and knee, initial encounter: Secondary | ICD-10-CM

## 2011-08-10 DIAGNOSIS — I70269 Atherosclerosis of native arteries of extremities with gangrene, unspecified extremity: Secondary | ICD-10-CM

## 2011-08-10 LAB — GLUCOSE, CAPILLARY: Glucose-Capillary: 140 mg/dL — ABNORMAL HIGH (ref 70–99)

## 2011-08-11 DIAGNOSIS — S78119A Complete traumatic amputation at level between unspecified hip and knee, initial encounter: Secondary | ICD-10-CM

## 2011-08-11 DIAGNOSIS — I70269 Atherosclerosis of native arteries of extremities with gangrene, unspecified extremity: Secondary | ICD-10-CM

## 2011-08-11 LAB — GLUCOSE, CAPILLARY
Glucose-Capillary: 162 mg/dL — ABNORMAL HIGH (ref 70–99)
Glucose-Capillary: 138 mg/dL — ABNORMAL HIGH (ref 70–99)
Glucose-Capillary: 99 mg/dL (ref 70–99)
Glucose-Capillary: 229 mg/dL — ABNORMAL HIGH (ref 70–99)

## 2011-08-12 LAB — GLUCOSE, CAPILLARY
Glucose-Capillary: 130 mg/dL — ABNORMAL HIGH (ref 70–99)
Glucose-Capillary: 178 mg/dL — ABNORMAL HIGH (ref 70–99)

## 2011-08-12 LAB — URINALYSIS, ROUTINE W REFLEX MICROSCOPIC
Glucose, UA: NEGATIVE mg/dL
Hgb urine dipstick: NEGATIVE
Ketones, ur: NEGATIVE mg/dL
Protein, ur: NEGATIVE mg/dL

## 2011-08-12 NOTE — Op Note (Signed)
  NAMESHERNELL, [name]                ACCOUNT NO.:  [id number]  MEDICAL RECORD NO.:  [id number]  LOCATION:  2899                         FACILITY:  [facility]  PHYSICIAN:  Juleen China IV, MDDATE OF BIRTH:  1927-09-03  DATE OF PROCEDURE:  07/27/2011 DATE OF DISCHARGE:                              OPERATIVE REPORT   PREOPERATIVE DIAGNOSIS:  Ischemic right leg.  POSTOPERATIVE DIAGNOSIS:  Ischemic right leg.  PROCEDURE PERFORMED:  Right above-knee amputation.  SURGEON: 1. Charlena Cross, MD.  ASSISTANCE:  Newton Pigg, PA  ANESTHESIA:  General.  BLOOD LOSS:  Minimal.  SPECIMENS:  Right leg.  COMPLICATIONS:  None.  INDICATIONS:  This is an 77 year old female who has developed a right lower extremity wound.  This has progressed over time.  She had attempted percutaneous revascularization.  However, this was not possible due to the calcification within her vessels and the inability to cross aortic bifurcation.  She has a history of bypass in the remote past done by Dr. Edwyna Shell which are occluded.  She was deemed to be a high risk redo bypass by Cardiology.  Her leg has become nonsalvageable.  She comes in today for amputation.  PROCEDURE:  Patient was identified in the holding area and taken to room 6, placed supine on the table.  General anesthesia was administered. The patient was prepped and draped in usual fashion.  Time-out was called.  Antibiotics were given.  A fishmouth incision was made above the knee.  Cautery was used to divide the subcutaneous tissue.  The fascia was divided with cautery.  I then divided the muscle down to the femur and the femur was circumferentially exposed.  Periosteal elevator was used to elevate the periosteum.  A giggly saw was used to transect the bone with beveling of the anterior surface.  I then used cautery to divide the remaining portion of the muscle.  The neurovascular bundle was identified.  The artery, nerve and vein were  each individually ligated proximal to the cut edge of the femur.  The leg was then removed as a specimen.  The wound was then irrigated. Hemostasis was achieved.  A rasp was used to smooth the edges of the bone.  The fascia was then reapproximated with interrupted 2-0 Vicryl. The skin was closed with staples.  The patient tolerated procedure well. Sterile dressings were applied.  The patient was taken to recovery room in stable condition.Jorge Ny, MD     VWB/MEDQ  D:  07/27/2011  T:  07/27/2011  Job:  [id number]  Electronically Signed by Arelia Longest IV MD on 08/12/2011 09:57:02 AM

## 2011-08-12 NOTE — Discharge Summary (Signed)
NAMEJORDON, BOURQUIN                ACCOUNT NO.:  [id number]  MEDICAL RECORD NO.:  [id number]  LOCATION:  5025                         FACILITY:  [facility]  PHYSICIAN:  Juleen China IV, MDDATE OF BIRTH:  [birth year]  DATE OF ADMISSION:  07/27/2011 DATE OF DISCHARGE:  08/01/2011                        DISCHARGE SUMMARY - REFERRING   ADMISSION DIAGNOSIS:  Ischemic right leg.  HISTORY OF PRESENT ILLNESS:  This is an 77 year old female who developed a right lower extremity wound and has progressed over time.  She had attempted percutaneous revascularization.  However, this was not possible due to calcification within her vessels and the inability to cross the aortic bifurcation.  She has a history of bypass in the remote past by Dr. Edwyna Shell, which are occluded.  She was deemed to be high risk, redo bypass surgery by Cardiology.  Her leg has become non-salvageable and she comes in for an amputation.  HOSPITAL COURSE:  The patient was admitted to the hospital and taken to the operating room on July 27, 2011, where she underwent a right above-the-knee amputation.  She tolerated the procedure well and was transported to the recovery room in satisfactory condition.  By postoperative day #1, she did have acute surgical blood loss anemia and was transfused 2 units of packed red blood cells.  By postoperative day #2, her hemoglobin was much improved.  She has continued to do well postoperatively.  She was evaluated by physical therapy for inpatient rehabilitation for which she qualified.  Her right AKA stump continues to look good and is healing nicely.  DISCHARGE DIAGNOSES: 1. Ischemic right leg.     a.     Status post right above-the-knee amputation, July 27, 2011. 2. Diabetes. 3. Hypertension. 4. Congestive heart failure. 5. Chronic kidney disease. 6. Coronary artery disease. 7. Chronic obstructive pulmonary disease. 8. Bilateral cataracts. 9.  Hyperlipidemia. 10.Status post femoral-popliteal bypass surgery. 11.Glaucoma. 12.History of transient ischemic attack. 13.Status post coronary artery bypass graft. 14.History of spine surgery. 15.History of eye surgery. 16.History of carpal tunnel release. 17.History of right lumpectomy.  DISCHARGE MEDICATIONS: 1. MiraLax 17 g p.o. daily. 2. Lasix 20 mg p.o. b.i.d. 3. Glucosamine over-the-counter 3 capsules b.i.d. 4. Colchicine 0.6 mg p.o. b.i.d. p.r.n. 5. Vitamin D2 50,000 units 1 capsule by mouth monthly. 6. Renal vitamin p.o. daily. 7. Allopurinol 100 mg p.o. daily. 8. Zetia 10 mg p.o. daily. 9. Pepcid 20 mg p.o. b.i.d. 10.Isosorbide dinitrate 40 mg p.o. b.i.d. 11.Hydralazine 50 mg 1-1/2 tablets p.o. t.i.d. 12.Amlodipine 5 mg p.o. daily. 13.Clonidine 0.1 mg p.o. b.i.d. 14.Mupirocin 2% topical, apply b.i.d. p.r.n. 15.Triamcinolone topical 0.1% b.i.d. p.r.n. 16.Colace 100 mg 2 capsules p.o. b.i.d. 17.Aspirin 81 mg p.o. daily. 18.Ferrex 150 mg p.o. b.i.d. 19.Econazole nitrate 1% topically b.i.d. p.r.n. 20.Lotrimin 1% topically daily p.r.n. 21.Oxycodone/acetaminophen 7.5/325 one p.o. q.6 h p.r.n. pain. 22.Lovenox 30 mg subcu daily. 23.Chocolate Ensure p.o. t.i.d. 24.Marinol 2.5 mg p.o. b.i.d. 25.Timolol 0.5% 1 drop b.i.d. both eyes. 26.Continue sliding scale insulin.  FOLLOWUP:  The patient is to follow up with Dr. Myra Gianotti in 4 weeks from surgery.  DISCHARGE INSTRUCTIONS:  The patient is discharged to inpatient rehab. She may shower.  Continue physical therapy as indicated in inpatient rehab.     Newton Pigg, PA   ______________________________ V. Charlena Cross, MD    SE/MEDQ  D:  08/01/2011  T:  08/01/2011  Job:  [id number]  Electronically Signed by Newton Pigg PA on 08/03/2011 10:40:09 AM Electronically Signed by Arelia Longest IV MD on 08/12/2011 09:56:58 AM

## 2011-08-13 LAB — GLUCOSE, CAPILLARY
Glucose-Capillary: 165 mg/dL — ABNORMAL HIGH (ref 70–99)
Glucose-Capillary: 227 mg/dL — ABNORMAL HIGH (ref 70–99)
Glucose-Capillary: 116 mg/dL — ABNORMAL HIGH (ref 70–99)

## 2011-08-13 LAB — URINE CULTURE: Colony Count: NO GROWTH

## 2011-08-14 DIAGNOSIS — S78119A Complete traumatic amputation at level between unspecified hip and knee, initial encounter: Secondary | ICD-10-CM

## 2011-08-14 DIAGNOSIS — I70269 Atherosclerosis of native arteries of extremities with gangrene, unspecified extremity: Secondary | ICD-10-CM

## 2011-08-14 LAB — GLUCOSE, CAPILLARY: Glucose-Capillary: 163 mg/dL — ABNORMAL HIGH (ref 70–99)

## 2011-08-15 DIAGNOSIS — S78119A Complete traumatic amputation at level between unspecified hip and knee, initial encounter: Secondary | ICD-10-CM

## 2011-08-15 DIAGNOSIS — I70269 Atherosclerosis of native arteries of extremities with gangrene, unspecified extremity: Secondary | ICD-10-CM

## 2011-08-15 LAB — GLUCOSE, CAPILLARY: Glucose-Capillary: 152 mg/dL — ABNORMAL HIGH (ref 70–99)

## 2011-08-21 NOTE — Discharge Summary (Signed)
[name], [name]                ACCOUNT NO.:  [id number]  MEDICAL RECORD NO.:  [id number]  LOCATION:  4032                         FACILITY:  [facility]  PHYSICIAN:  Erick Colace, M.D.DATE OF BIRTH:  May 15, 1927  DATE OF ADMISSION:  08/01/2011 DATE OF DISCHARGE:  08/15/2011                              DISCHARGE SUMMARY   DISCHARGE DIAGNOSES: 1. Peripheral vascular disease with right above-the-knee amputation. 2. Diabetes mellitus type 2. 3. Acute blood loss anemia. 4. Anorexia, resolved. 5. Congestive heart failure, compensated. 6. Chronic kidney disease.  HISTORY OF PRESENT ILLNESS:  [name] is an 77 year old female with history of diabetes mellitus, peripheral vascular disease with nonhealing right calf wound and right lower extremity pain, admitted on July 27, 2011, for right AKA due to ischemic right lower extremity postop with acute blood loss anemia with hemoglobin down to 7.6, requiring 2 units of packed red blood cells.  Most recent hemoglobin is at 9.7.  P.o. intake has been poor and Marinol was added to help with appetite.  Therapies were initiated and the patient is noted to require verbal and visual cues to help with mobility.  She is currently able to stand 2 minutes with OT.  Rehab was consulted for input and we felt that she would benefit from a CIR program.  PAST MEDICAL HISTORY:  Significant for: 1. DM type 2. 2. Chronic kidney disease. 3. CHF. 4. Coronary artery disease with CABG in 2001. 5. COPD. 6. Bilateral cataracts. 7. Peripheral vascular disease with bilateral fem-pop bypass graft. 8. TIA. 9. Gout. 10.Glaucoma. 11.Left carpal tunnel release. 12.Right partial mastectomy for cancer.  ALLERGIES: 1. CODEINE. 2. CELEBREX. 3. LAMISIL. 4. AVANDIA. 5. ZOCOR. 6. RELAFEN. 7. CRESTOR. 8. LIPITOR.  REVIEW OF SYMPTOMS:  Positive for weakness as well as wound care issues.  FAMILY HISTORY:  Positive for history of amputations question  due to diabetes versus peripheral vascular disease.  SOCIAL HISTORY:  The patient lives alone in 1-level home with no steps at entry.  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  Has a history of 40-pack-year tobacco use, quit in 1978.  Does not use any alcohol. Family is very supportive and plans to assist past discharge.  FUNCTIONAL HISTORY:  The patient was independent with cane and walker prior to admission; however, sedentary.  Needed some assist with ADLs.  FUNCTIONAL STATUS:  The patient is mod-assist 50% for bed mobility, +3 mod-assist for transfers with verbal cuing.  She is mod-assist for upper body care, max-to-total-assist for lower body care.  PHYSICAL EXAMINATION:  VITAL SIGNS:  Blood pressure 179/71, pulse 63, respiratory rate 18, temperature 98.6. GENERAL:  The patient is pleasant female, alert and oriented x3. HEENT:  Pupils are equal, round, and reactive to light.  Oral mucosa is pink and moist.  Full set dentures in place.  Hearing intact. NECK:  Supple without JVD or lymphadenopathy. LUNGS:  Clear to auscultation bilaterally without wheezes, rales, or rhonchi. HEART:  Shows regular rate and rhythm without murmurs or gallops. ABDOMEN:  Soft and nontender with positive bowel sounds. SKIN:  Notable for right AKA incision, which is clean and intact with staples in  place.  Left foot without breakdown, some dry flaky skin noted. NEUROLOGIC:  Cranial nerves II-XII with reasonable vision.  Nose within normal limits.  Sensation; slightly diminished right lower extremity. Left lower extremity in stocking-glove distribution over foot.  Reflex is 1+.  Judgment, orientation, memory, and mood are within normal limits.  Strength is 4-5/5 in upper extremities, right more than left. She has some limitations over left shoulder due to some chronic pain issues.  Left lower extremity is grossly 3-4/5 proximally and 4/5 distally.  She is able to lift right leg at hip approximately  2/5 strength.  HOSPITAL COURSE:  [name] was admitted to rehab on August 01, 2011, for inpatient therapies to consist of PT/OT at least 3 hours 5 days a week.  Past-admission, physiatrist, rehab RN and therapy team have worked together to provide customized collaborative interdisciplinary care.  Rehab RN has worked with the patient on bowel and bladder program as well as skin care issues.  The patient was noted to have a stage II sacrum at the time of admission and this was treated with Allevyn.  The patient's wound were monitored and right AKA is noted to be healing well.  Vitals have been checked on b.i.d. basis and blood pressures were noted to be poorly controlled.  She was noted to have some worsening of renal insufficiency at time of admission and her Lasix was discontinued.  Currently, blood pressure systolics are ranging from 160s in a.m. to 120s in p.m.  She is to follow up with primary MD about further titration in her BP meds.  Her renal status was monitored.  At the time of admission, BUN and creatinine were at 44 and 1.34.  Last check of August 09, 2011, reveals BUN 37, creatinine 1.43.  CBC check shows H and H stable at 9.7 and 29.3, white count 6.7, platelets 301.  A UA/UC was done and UA was negative, urine culture showed no growth.  The patient initially had issues with pain management.  She was started on OxyContin with OxyIR used prior to therapy sessions.  Neurontin was also added additionally due to neuropathic and phantom pain.  The patient was noted to have some increased in sedation over the weekend of August 12, 2011, and she has been tapered off OxyContin as well as Neurontin by time of discharge.  Overall, pain is well controlled on p.r.n. meds only.  The patient's diabetes was monitored with a.c. and at bedtime basis.  As of p.o. intake improved, the patient's blood sugars were noted to be trending upwards.  She was started on Amaryl 1 mg  p.o. per day, which was slowly increased to 2 mg p.o. due to history of hypoglycemia in the past.  Currently, blood sugars are ranging at 140s to 170s range.  She has been taken off Marinol prior to discharge as questioned whether this could be causing some lethargy.  The patient's p.o. intake is good and the patient's family has been educated about pushing protein supplements and increasing protein intake to promote wound healing.  During the patient's stay in rehab, weekly team conferences were held to monitor the patient's progress, set goals as well as discuss barriers to discharge.  The patient was initially at [facility] for mobility.  She is currently progressed to being at supervision level for functional mobility with use of power wheelchair.  She is min-assist for lateral weight shifting as well as squat pivot transfers.  OT has been working with the patient  on self-care tasks.  At admission, the patient with generalized deconditioning and required setup assist for upper body care, total assist for lower body care.  OT has worked with the patient on balance as well as strengthening.  Therefore, also focused on transfers from power chair to tub bench and bathing and dressing with lateral leans for lower body care.  Currently, the patient is at supervision level overall.  She requires min-assist at time for lower body dressing.  She is able to complete toilet and tub bench transfers with steady assist.  Further follow up home health, PT, OT, and to continue by [facility] past discharge.  On August 15, 2011, the patient is discharged to home.  DISCHARGE MEDICATIONS: 1. MiraLax 17 g p.o. per day. 2. Isordil 40 mg p.o. b.i.d. 3. Timoptic one gtt both eyes b.i.d. 4. Zetia 10 mg a day. 5. Aspirin 81 mg a day. 6. Nephro-Vite one per day. 7. Apresoline 75 mg p.o. t.i.d. 8. Zyloprim 200 mg p.o. per day. 9. Pepcid 20 mg a day. 10.Amaryl 2 mg p.o. per day. 11.Catapres 0.1  mg p.o. b.i.d. and 0.2 mg nightly. 12.Norvasc 10 mg p.o. per day. 13.Senokot-S two p.o. at noon and at bedtime. 14.Colchicine 0.6 mg p.o. b.i.d. p.r.n. gout symptoms.  Diet is carb-modified medium, low salt restrictions.  Activity level is 24-hour supervision.  No strenuous activity.  SPECIAL INSTRUCTIONS:  No alcohol, no driving, use sliding board for wheelchair, transfers with supervision.  Check blood sugars two to three times a day.  [facility] to provide PT, OT, and RN.  FOLLOWUP:  The patient to follow up with Dr. Riley Kill on September 26, 2011 at 10:30 for 11 a.m.  Follow up with Dr. Myra Gianotti for postop check and staple removal next week.  Follow up with Dr. Greig Castilla for medical issues.     Delle Reining, P.A.   ______________________________ Erick Colace, M.D.    PL/MEDQ  D:  08/15/2011  T:  08/16/2011  Job:  [id number]  cc:   Jorge Ny, MD Garnetta Buddy, M.D. Bryan Lemma. Manus Gunning, M.D.  Electronically Signed by Osvaldo Shipper. on 08/21/2011 10:28:03 AM Electronically Signed by Claudette Laws M.D. on 08/21/2011 01:13:03 PM

## 2011-08-28 ENCOUNTER — Encounter: Payer: Self-pay | Admitting: Surgery

## 2011-08-28 ENCOUNTER — Ambulatory Visit (INDEPENDENT_AMBULATORY_CARE_PROVIDER_SITE_OTHER): Payer: Medicare Other | Admitting: Surgery

## 2011-08-28 VITALS — BP 175/64 | HR 63 | Temp 98.2°F | Resp 16

## 2011-08-28 DIAGNOSIS — Z89619 Acquired absence of unspecified leg above knee: Secondary | ICD-10-CM

## 2011-08-28 DIAGNOSIS — I7092 Chronic total occlusion of artery of the extremities: Secondary | ICD-10-CM

## 2011-08-28 DIAGNOSIS — S78119A Complete traumatic amputation at level between unspecified hip and knee, initial encounter: Secondary | ICD-10-CM

## 2011-08-28 NOTE — Progress Notes (Signed)
The patient comes back today for followup of a right above-knee amputation. She has done extremely well with her recovery. She completed a course and rehabilitation and is now home she is able to transfer on her own her spirits are much improved her appetite is still down but getting better according to her family she had to stop her Marinol 2 to mental status issues while she was in rehabilitation.  On examination her wound is healing nicely. We will remove staples today. On the right a prescription for a stump shrinker she will continue to followup in our vascular lab the contact me regarding her right leg on a when necessary basis

## 2011-08-28 NOTE — Progress Notes (Signed)
Addended by: Sharee Pimple on: 08/28/2011 01:52 PM   Modules accepted: Orders

## 2011-08-28 NOTE — Progress Notes (Signed)
Addended by: Sharee Pimple on: 08/28/2011 01:57 PM   Modules accepted: Orders

## 2011-08-29 DIAGNOSIS — Z0271 Encounter for disability determination: Secondary | ICD-10-CM

## 2011-09-26 ENCOUNTER — Encounter: Payer: Medicare Other | Attending: Physical Medicine & Rehabilitation | Admitting: Physical Medicine & Rehabilitation

## 2011-09-26 DIAGNOSIS — I2510 Atherosclerotic heart disease of native coronary artery without angina pectoris: Secondary | ICD-10-CM

## 2011-09-26 DIAGNOSIS — S78119A Complete traumatic amputation at level between unspecified hip and knee, initial encounter: Secondary | ICD-10-CM

## 2011-09-26 DIAGNOSIS — I509 Heart failure, unspecified: Secondary | ICD-10-CM | POA: Insufficient documentation

## 2011-09-26 DIAGNOSIS — I70269 Atherosclerosis of native arteries of extremities with gangrene, unspecified extremity: Secondary | ICD-10-CM

## 2011-09-26 DIAGNOSIS — Z951 Presence of aortocoronary bypass graft: Secondary | ICD-10-CM | POA: Insufficient documentation

## 2011-09-26 DIAGNOSIS — K5900 Constipation, unspecified: Secondary | ICD-10-CM | POA: Insufficient documentation

## 2011-09-26 DIAGNOSIS — S88119A Complete traumatic amputation at level between knee and ankle, unspecified lower leg, initial encounter: Secondary | ICD-10-CM | POA: Insufficient documentation

## 2011-09-26 DIAGNOSIS — N189 Chronic kidney disease, unspecified: Secondary | ICD-10-CM | POA: Insufficient documentation

## 2011-09-26 NOTE — Assessment & Plan Note (Signed)
[name] is back regarding her right below-knee amputation.  She was discharged from rehab on August 15, 2011, and has been home with Home Health.  They finished up with her last week.  She is interested in an above-knee prosthesis.  She is doing transfers on her own at home to the bathroom as well, etc.  She needs some assistance while getting in the car.  She denies any shortness of breath or chest pain.  She has not seen a cardiologist back, but her family physician, Dr. Manus Gunning gave her a good report apparently.  Pain is under fair control at this point.  REVIEW OF SYSTEMS:  Notable for some constipation, high blood sugars at time.  She has had some limb swelling and her Lasix was recently increased.  SOCIAL HISTORY:  The patient is living alone.  Family is involved with her care still.  Family is with her today.  PHYSICAL EXAMINATION:  VITAL SIGNS:  Blood pressure is 160/55, pulse 52, respiratory rate 18, she is saturating 98% on room air. GENERAL:  The patient is pleasant and alert. EXTREMITIES:  She has 5/5 strength in left lower extremity, 4/5 at the right hip with flexion/extension.  Unable to perform any contracture testing of the right hip due to her position today.  Upper extremity strength is grossly 4/5. NEUROLOGIC:  She is alert and appropriate and in good spirits. Cognitively, she is intact. HEART:  Regular rhythm, but slightly bradycardic. CHEST:  Clear. ABDOMEN:  Soft, nontender.  ASSESSMENT: 1. Right above-knee amputation. 2. History of congestive heart failure and coronary artery disease     with prior coronary artery bypass grafting. 3. Chronic kidney disease.  PLAN:  At this point, before we proceed with above-knee prosthesis, I would like to have therapy evaluate her formally for exercise tolerance and appropriateness for an above-knee prosthesis.  I explained the fact that the powerring of the prosthesis takes quite a bit more energy than her  previous, baseline ambulation and I have concerns given her medical history that this might be a bit too much for her.  We will set her up with [facility] to do a formal assessment regarding her transfer ability, strength, and exercise tolerance.  If she checks out okay, I am willing to write a prescription for prosthesis, and she may go forward with this process.  Apparently, she has been already molded for a prosthesis for [employer].  I will see her back as needed in the future and give her further recommendations via telephone.     Ranelle Oyster, M.D. Electronically Signed    ZTS/MedQ D:  09/26/2011 11:40:58  T:  09/26/2011 12:25:29  Job #:  [id number]  cc:   Bryan Lemma. Manus Gunning, M.D. Fax: [phone]  V. Charlena Cross, MD [address] [place] [address]

## 2011-10-10 ENCOUNTER — Ambulatory Visit: Payer: Medicare Other | Attending: Physical Medicine & Rehabilitation | Admitting: Physical Therapy

## 2011-10-10 ENCOUNTER — Ambulatory Visit: Payer: Medicare Other | Admitting: Rehabilitative and Restorative Service Providers"

## 2011-10-10 DIAGNOSIS — R269 Unspecified abnormalities of gait and mobility: Secondary | ICD-10-CM | POA: Insufficient documentation

## 2011-10-10 DIAGNOSIS — M6281 Muscle weakness (generalized): Secondary | ICD-10-CM | POA: Insufficient documentation

## 2011-10-10 DIAGNOSIS — S78119A Complete traumatic amputation at level between unspecified hip and knee, initial encounter: Secondary | ICD-10-CM | POA: Insufficient documentation

## 2011-10-10 DIAGNOSIS — IMO0001 Reserved for inherently not codable concepts without codable children: Secondary | ICD-10-CM | POA: Insufficient documentation

## 2011-10-10 DIAGNOSIS — R5381 Other malaise: Secondary | ICD-10-CM | POA: Insufficient documentation

## 2011-10-13 ENCOUNTER — Ambulatory Visit: Payer: Medicare Other | Admitting: Physical Therapy

## 2011-10-16 ENCOUNTER — Ambulatory Visit: Payer: Medicare Other | Attending: Physical Medicine & Rehabilitation | Admitting: Physical Therapy

## 2011-10-16 DIAGNOSIS — R269 Unspecified abnormalities of gait and mobility: Secondary | ICD-10-CM | POA: Insufficient documentation

## 2011-10-16 DIAGNOSIS — S78119A Complete traumatic amputation at level between unspecified hip and knee, initial encounter: Secondary | ICD-10-CM | POA: Insufficient documentation

## 2011-10-16 DIAGNOSIS — R5381 Other malaise: Secondary | ICD-10-CM | POA: Insufficient documentation

## 2011-10-16 DIAGNOSIS — M6281 Muscle weakness (generalized): Secondary | ICD-10-CM | POA: Insufficient documentation

## 2011-10-16 DIAGNOSIS — IMO0001 Reserved for inherently not codable concepts without codable children: Secondary | ICD-10-CM | POA: Insufficient documentation

## 2011-10-23 ENCOUNTER — Ambulatory Visit: Payer: Medicare Other | Admitting: Physical Therapy

## 2011-10-25 ENCOUNTER — Ambulatory Visit: Payer: Medicare Other | Admitting: Physical Therapy

## 2011-10-31 ENCOUNTER — Ambulatory Visit: Payer: Medicare Other | Admitting: Physical Therapy

## 2011-11-02 ENCOUNTER — Ambulatory Visit: Payer: Medicare Other | Admitting: Physical Therapy

## 2011-11-06 ENCOUNTER — Ambulatory Visit: Payer: Medicare Other | Admitting: Physical Therapy

## 2011-11-10 ENCOUNTER — Ambulatory Visit: Payer: Medicare Other | Admitting: Physical Therapy

## 2011-11-20 ENCOUNTER — Encounter: Payer: Medicare Other | Attending: Physical Medicine & Rehabilitation | Admitting: Physical Medicine & Rehabilitation

## 2011-11-20 DIAGNOSIS — I2510 Atherosclerotic heart disease of native coronary artery without angina pectoris: Secondary | ICD-10-CM | POA: Insufficient documentation

## 2011-11-20 DIAGNOSIS — S78119A Complete traumatic amputation at level between unspecified hip and knee, initial encounter: Secondary | ICD-10-CM | POA: Insufficient documentation

## 2011-11-20 DIAGNOSIS — I509 Heart failure, unspecified: Secondary | ICD-10-CM | POA: Insufficient documentation

## 2011-11-20 DIAGNOSIS — L98499 Non-pressure chronic ulcer of skin of other sites with unspecified severity: Secondary | ICD-10-CM

## 2011-11-20 DIAGNOSIS — I739 Peripheral vascular disease, unspecified: Secondary | ICD-10-CM

## 2011-11-20 NOTE — Assessment & Plan Note (Signed)
[name] is back regarding her right above-knee amputation. Apparently, she was fitted with a right above knee prosthesis in the fall despite my recommendation as to trial therapy first to see if she was a candidate.  Today, the patient returns requesting paperwork to be completed for a power wheelchair.  The patient has difficulties lifting her artificial leg as it is quite heavy.  It does not help her with transfers obviously.  She also has tolerance issues related to her heart history.  She would like to use a power wheelchair to be more independent around the house and in the community.  REVIEW OF SYSTEMS:  Notable for the above.  Full 12-point review is in the written health and history section of the chart.  SOCIAL HISTORY:  Unchanged.  She is widowed and family is involved.  PHYSICAL EXAMINATION:  VITAL SIGNS:  Blood pressure is 150/43, pulse 65, respiratory rate 14 and she is satting 98% on room air. GENERAL:  The patient is pleasant, alert and sitting in her chair. EXTREMITIES:  Right leg, hip flexion and extension are grossly 4/5. Left lower extremity is 3/5 proximally to 4/5 distally.  Upper extremity strength is 4/5 proximally.  She is 3/5 distally at the hand intrinsics. She has some distal sensory loss noted in the left foot and hands. NEUROLOGIC:  Cognitively, she is alert and appropriate.  Has good insight and awareness.  Cranial nerve exam is generally intact except perhaps for the diminished visual acuity.  The patient is unable to transfer to standing from her wheelchair today.  She has fair fine motor coordination in the upper extremities today.  ASSESSMENT: 1. Right above-knee amputation. 2. History of congestive heart failure and coronary artery disease.  PLAN: 1. I feel this patient is most appropriate for a powered wheelchair.     She is unable to ambulate at this point using a cane and walker,     and I do not see her functionally ambulating with a  prosthesis     unfortunately.  She is unable to power a manual wheelchair due to     her hand intrinsic weakness as well as her cardiac issues.  She     does not have the truncal control  to safely use a scooter.  She is     cognitively appropriate to use a power wheelchair, however, and     with the wheelchair  she could become independent in the household     and on limited basis in the community 2. I will see this patient back as needed in the future.  She will     follow up with Dr. Manus Gunning for basic medical issues and continue     with PT at [facility] outpatient.     Ranelle Oyster, M.D. Electronically Signed    ZTS/MedQ D:  11/20/2011 13:10:49  T:  11/20/2011 19:08:13  Job #:  [id number]

## 2011-11-22 ENCOUNTER — Ambulatory Visit: Payer: Medicare Other | Attending: Physical Medicine & Rehabilitation | Admitting: Physical Therapy

## 2011-11-22 DIAGNOSIS — R5381 Other malaise: Secondary | ICD-10-CM | POA: Insufficient documentation

## 2011-11-22 DIAGNOSIS — S78119A Complete traumatic amputation at level between unspecified hip and knee, initial encounter: Secondary | ICD-10-CM | POA: Insufficient documentation

## 2011-11-22 DIAGNOSIS — M6281 Muscle weakness (generalized): Secondary | ICD-10-CM | POA: Insufficient documentation

## 2011-11-22 DIAGNOSIS — IMO0001 Reserved for inherently not codable concepts without codable children: Secondary | ICD-10-CM | POA: Insufficient documentation

## 2011-11-22 DIAGNOSIS — R269 Unspecified abnormalities of gait and mobility: Secondary | ICD-10-CM | POA: Insufficient documentation

## 2011-11-24 ENCOUNTER — Ambulatory Visit: Payer: Medicare Other | Admitting: Physical Therapy

## 2011-11-27 ENCOUNTER — Ambulatory Visit: Payer: Medicare Other | Admitting: Physical Therapy

## 2011-11-29 ENCOUNTER — Ambulatory Visit: Payer: Medicare Other | Admitting: Physical Therapy

## 2011-12-04 ENCOUNTER — Ambulatory Visit: Payer: Medicare Other | Admitting: Physical Therapy

## 2011-12-06 ENCOUNTER — Ambulatory Visit: Payer: Medicare Other | Admitting: Physical Therapy

## 2011-12-11 ENCOUNTER — Ambulatory Visit: Payer: Medicare Other | Admitting: Physical Therapy

## 2011-12-13 ENCOUNTER — Ambulatory Visit: Payer: Medicare Other | Admitting: Physical Therapy

## 2011-12-18 ENCOUNTER — Ambulatory Visit: Payer: Medicare Other | Attending: Physical Medicine & Rehabilitation | Admitting: Physical Therapy

## 2011-12-18 DIAGNOSIS — R269 Unspecified abnormalities of gait and mobility: Secondary | ICD-10-CM | POA: Insufficient documentation

## 2011-12-18 DIAGNOSIS — M6281 Muscle weakness (generalized): Secondary | ICD-10-CM | POA: Insufficient documentation

## 2011-12-18 DIAGNOSIS — S78119A Complete traumatic amputation at level between unspecified hip and knee, initial encounter: Secondary | ICD-10-CM | POA: Insufficient documentation

## 2011-12-18 DIAGNOSIS — R5381 Other malaise: Secondary | ICD-10-CM | POA: Insufficient documentation

## 2011-12-18 DIAGNOSIS — IMO0001 Reserved for inherently not codable concepts without codable children: Secondary | ICD-10-CM | POA: Insufficient documentation

## 2011-12-21 ENCOUNTER — Ambulatory Visit: Payer: Medicare Other | Admitting: Physical Therapy

## 2011-12-22 ENCOUNTER — Ambulatory Visit: Payer: Medicare Other | Admitting: Physical Therapy

## 2011-12-25 ENCOUNTER — Ambulatory Visit: Payer: Medicare Other | Admitting: Physical Therapy

## 2011-12-27 ENCOUNTER — Ambulatory Visit: Payer: Medicare Other | Admitting: Physical Therapy

## 2012-01-01 ENCOUNTER — Encounter: Payer: Medicare Other | Admitting: Physical Therapy

## 2012-01-03 ENCOUNTER — Ambulatory Visit: Payer: Medicare Other | Admitting: Physical Therapy

## 2012-01-05 ENCOUNTER — Ambulatory Visit: Payer: Medicare Other | Admitting: Physical Therapy

## 2012-01-08 ENCOUNTER — Ambulatory Visit: Payer: Medicare Other | Admitting: Physical Therapy

## 2012-01-10 ENCOUNTER — Ambulatory Visit: Payer: Medicare Other | Admitting: Physical Therapy

## 2012-01-16 ENCOUNTER — Ambulatory Visit: Payer: Medicare Other | Attending: Physical Medicine & Rehabilitation | Admitting: Physical Therapy

## 2012-01-16 DIAGNOSIS — M6281 Muscle weakness (generalized): Secondary | ICD-10-CM | POA: Insufficient documentation

## 2012-01-16 DIAGNOSIS — R5381 Other malaise: Secondary | ICD-10-CM | POA: Insufficient documentation

## 2012-01-16 DIAGNOSIS — IMO0001 Reserved for inherently not codable concepts without codable children: Secondary | ICD-10-CM | POA: Insufficient documentation

## 2012-01-16 DIAGNOSIS — R269 Unspecified abnormalities of gait and mobility: Secondary | ICD-10-CM | POA: Insufficient documentation

## 2012-01-16 DIAGNOSIS — S78119A Complete traumatic amputation at level between unspecified hip and knee, initial encounter: Secondary | ICD-10-CM | POA: Insufficient documentation

## 2012-01-18 ENCOUNTER — Ambulatory Visit: Payer: Medicare Other | Admitting: Physical Therapy

## 2012-01-22 ENCOUNTER — Ambulatory Visit: Payer: Medicare Other | Admitting: Physical Therapy

## 2012-01-24 ENCOUNTER — Ambulatory Visit: Payer: Medicare Other | Admitting: Physical Therapy

## 2012-01-29 ENCOUNTER — Encounter: Payer: Medicare Other | Admitting: Physical Therapy

## 2012-01-30 ENCOUNTER — Ambulatory Visit: Payer: Medicare Other | Admitting: Physical Therapy

## 2012-01-30 ENCOUNTER — Encounter: Payer: Medicare Other | Admitting: Physical Therapy

## 2012-02-01 ENCOUNTER — Encounter: Payer: Medicare Other | Admitting: Physical Therapy

## 2012-02-05 ENCOUNTER — Encounter: Payer: Medicare Other | Admitting: Physical Therapy

## 2012-02-05 ENCOUNTER — Ambulatory Visit: Payer: Medicare Other | Admitting: Physical Therapy

## 2012-02-07 ENCOUNTER — Encounter: Payer: Medicare Other | Admitting: Physical Therapy

## 2012-02-14 ENCOUNTER — Encounter: Payer: Self-pay | Admitting: Physical Medicine & Rehabilitation

## 2012-03-06 ENCOUNTER — Ambulatory Visit (INDEPENDENT_AMBULATORY_CARE_PROVIDER_SITE_OTHER): Payer: Medicare Other | Admitting: Vascular Surgery

## 2012-03-06 DIAGNOSIS — Z48812 Encounter for surgical aftercare following surgery on the circulatory system: Secondary | ICD-10-CM

## 2012-03-06 DIAGNOSIS — I739 Peripheral vascular disease, unspecified: Secondary | ICD-10-CM

## 2012-03-06 DIAGNOSIS — S78119A Complete traumatic amputation at level between unspecified hip and knee, initial encounter: Secondary | ICD-10-CM

## 2012-03-06 DIAGNOSIS — I7092 Chronic total occlusion of artery of the extremities: Secondary | ICD-10-CM

## 2012-03-06 NOTE — Progress Notes (Signed)
LLE Arterial duplex performed @ [facility] 03/06/2012

## 2012-04-01 ENCOUNTER — Other Ambulatory Visit: Payer: Self-pay | Admitting: *Deleted

## 2012-04-01 ENCOUNTER — Other Ambulatory Visit (HOSPITAL_COMMUNITY): Payer: Self-pay | Admitting: *Deleted

## 2012-04-01 ENCOUNTER — Encounter: Payer: Self-pay | Admitting: Surgery

## 2012-04-01 DIAGNOSIS — I739 Peripheral vascular disease, unspecified: Secondary | ICD-10-CM

## 2012-04-01 DIAGNOSIS — Z48812 Encounter for surgical aftercare following surgery on the circulatory system: Secondary | ICD-10-CM

## 2012-04-02 NOTE — Procedures (Unsigned)
BYPASS GRAFT EVALUATION  INDICATION:  Peripheral vascular disease.  HISTORY: Diabetes:  Yes Cardiac:  No Hypertension:  Yes Smoking:  Previous. Previous Surgery:  Right above knee amputation on 07/27/2011; left femoral to popliteal artery bypass graft 11/23/2005.  SINGLE LEVEL ARTERIAL EXAM                              RIGHT              LEFT Brachial: Anterior tibial: Posterior tibial: Peroneal: Ankle/brachial index:                           0.68  Toe/brachial index:                       AKA               0.32  PREVIOUS ABI:  Date: 05/31/2011  RIGHT:  0  LEFT:  0.55  LOWER EXTREMITY BYPASS GRAFT DUPLEX EXAM:  DUPLEX:  Abnormal damp and spectral waveforms present at the distal external iliac artery and throughout the left lower extremity which are suggestive of a more proximal disease process.  IMPRESSION: 1. Patent left femoral to popliteal artery bypass graft, no recurrent     stenosis evident. 2. Normal dampened arterial flow involving the left lower extremity,     suggestive of a more proximal disease process. 3. Left ankle brachial index is 0.68 with minimal improvement since     previous study on 05/31/2011. 4. Left toe brachial index is 0.32 and considered in the severe     claudicant range.       ___________________________________________ V. Charlena Cross, MD  SH/MEDQ  D:  03/06/2012  T:  03/06/2012  Job:  [id number]

## 2012-04-09 ENCOUNTER — Encounter (HOSPITAL_COMMUNITY)
Admission: RE | Admit: 2012-04-09 | Discharge: 2012-04-09 | Disposition: A | Discharge status: Home / Self Care | Payer: Medicare Other | Source: Ambulatory Visit | Attending: Nephrology | Admitting: Nephrology

## 2012-04-09 DIAGNOSIS — D638 Anemia in other chronic diseases classified elsewhere: Secondary | ICD-10-CM | POA: Insufficient documentation

## 2012-04-09 DIAGNOSIS — N183 Chronic kidney disease, stage 3 unspecified: Secondary | ICD-10-CM | POA: Insufficient documentation

## 2012-04-09 MED ORDER — EPOETIN ALFA 20000 UNIT/ML IJ SOLN
20000.0000 [IU] | INTRAMUSCULAR | Status: DC
Start: 2012-04-09 — End: 2012-04-10

## 2012-04-09 MED ORDER — EPOETIN ALFA 20000 UNIT/ML IJ SOLN
INTRAMUSCULAR | Status: AC
  Administered 2012-04-09: 20000 [IU] via SUBCUTANEOUS
  Filled 2012-04-09: qty 1

## 2012-05-07 ENCOUNTER — Encounter (HOSPITAL_COMMUNITY): Payer: Medicare Other

## 2012-05-08 ENCOUNTER — Encounter (HOSPITAL_COMMUNITY)
Admission: RE | Admit: 2012-05-08 | Discharge: 2012-05-08 | Disposition: A | Discharge status: Home / Self Care | Payer: Medicare Other | Source: Ambulatory Visit | Attending: Nephrology | Admitting: Nephrology

## 2012-05-08 DIAGNOSIS — D638 Anemia in other chronic diseases classified elsewhere: Secondary | ICD-10-CM | POA: Insufficient documentation

## 2012-05-08 DIAGNOSIS — N183 Chronic kidney disease, stage 3 unspecified: Secondary | ICD-10-CM | POA: Insufficient documentation

## 2012-05-08 LAB — IRON AND TIBC
Saturation Ratios: 32 % (ref 20–55)
TIBC: 173 ug/dL — ABNORMAL LOW (ref 250–470)
UIBC: 117 ug/dL — ABNORMAL LOW (ref 125–400)

## 2012-05-08 LAB — POCT HEMOGLOBIN-HEMACUE: Hemoglobin: 8.7 g/dL — ABNORMAL LOW (ref 12.0–15.0)

## 2012-05-08 MED ORDER — EPOETIN ALFA 20000 UNIT/ML IJ SOLN
INTRAMUSCULAR | Status: AC
  Administered 2012-05-08: 20000 [IU] via SUBCUTANEOUS
  Filled 2012-05-08: qty 1

## 2012-05-08 MED ORDER — EPOETIN ALFA 20000 UNIT/ML IJ SOLN
20000.0000 [IU] | INTRAMUSCULAR | Status: DC
Start: 2012-05-08 — End: 2012-05-09
  Administered 2012-05-08: 20000 [IU] via SUBCUTANEOUS

## 2012-05-22 ENCOUNTER — Encounter (HOSPITAL_COMMUNITY)
Admission: RE | Admit: 2012-05-22 | Discharge: 2012-05-22 | Disposition: A | Discharge status: Home / Self Care | Payer: Medicare Other | Source: Ambulatory Visit | Attending: Nephrology | Admitting: Nephrology

## 2012-05-22 DIAGNOSIS — D638 Anemia in other chronic diseases classified elsewhere: Secondary | ICD-10-CM | POA: Insufficient documentation

## 2012-05-22 DIAGNOSIS — N183 Chronic kidney disease, stage 3 unspecified: Secondary | ICD-10-CM | POA: Insufficient documentation

## 2012-05-22 MED ORDER — EPOETIN ALFA 20000 UNIT/ML IJ SOLN
20000.0000 [IU] | INTRAMUSCULAR | Status: DC
  Administered 2012-05-22: 20000 [IU] via SUBCUTANEOUS
  Filled 2012-05-22: qty 1

## 2012-06-04 ENCOUNTER — Other Ambulatory Visit (HOSPITAL_COMMUNITY): Payer: Self-pay | Admitting: *Deleted

## 2012-06-05 ENCOUNTER — Encounter (HOSPITAL_COMMUNITY)
Admission: RE | Admit: 2012-06-05 | Discharge: 2012-06-05 | Disposition: A | Discharge status: Home / Self Care | Payer: Medicare Other | Source: Ambulatory Visit | Attending: Nephrology | Admitting: Nephrology

## 2012-06-05 LAB — IRON AND TIBC
Saturation Ratios: 27 % (ref 20–55)
UIBC: 132 ug/dL (ref 125–400)

## 2012-06-05 MED ORDER — EPOETIN ALFA 20000 UNIT/ML IJ SOLN
20000.0000 [IU] | INTRAMUSCULAR | Status: DC
Start: 2012-06-05 — End: 2012-06-06
  Administered 2012-06-05: 20000 [IU] via SUBCUTANEOUS

## 2012-06-05 MED ORDER — EPOETIN ALFA 20000 UNIT/ML IJ SOLN
INTRAMUSCULAR | Status: AC
  Administered 2012-06-05: 20000 [IU] via SUBCUTANEOUS
  Filled 2012-06-05: qty 1

## 2012-06-19 ENCOUNTER — Encounter (HOSPITAL_COMMUNITY)
Admission: RE | Admit: 2012-06-19 | Discharge: 2012-06-19 | Disposition: A | Discharge status: Home / Self Care | Payer: Medicare Other | Source: Ambulatory Visit | Attending: Nephrology | Admitting: Nephrology

## 2012-06-19 DIAGNOSIS — D638 Anemia in other chronic diseases classified elsewhere: Secondary | ICD-10-CM | POA: Insufficient documentation

## 2012-06-19 DIAGNOSIS — N183 Chronic kidney disease, stage 3 unspecified: Secondary | ICD-10-CM | POA: Insufficient documentation

## 2012-06-19 MED ORDER — EPOETIN ALFA 20000 UNIT/ML IJ SOLN
20000.0000 [IU] | INTRAMUSCULAR | Status: DC
  Administered 2012-06-19: 20000 [IU] via SUBCUTANEOUS
  Filled 2012-06-19: qty 1

## 2012-06-21 LAB — POCT HEMOGLOBIN-HEMACUE: Hemoglobin: 9.5 g/dL — ABNORMAL LOW (ref 12.0–15.0)

## 2012-06-22 ENCOUNTER — Emergency Department (HOSPITAL_COMMUNITY): Payer: Medicare Other

## 2012-06-22 ENCOUNTER — Inpatient Hospital Stay (HOSPITAL_COMMUNITY)
Admission: EM | Admit: 2012-06-22 | Discharge: 2012-07-01 | DRG: 291 | Disposition: A | Discharge status: Home Health | Payer: Medicare Other | Attending: Internal Medicine | Admitting: Internal Medicine

## 2012-06-22 ENCOUNTER — Encounter (HOSPITAL_COMMUNITY): Payer: Self-pay | Admitting: *Deleted

## 2012-06-22 DIAGNOSIS — I2510 Atherosclerotic heart disease of native coronary artery without angina pectoris: Secondary | ICD-10-CM | POA: Diagnosis present

## 2012-06-22 DIAGNOSIS — Z951 Presence of aortocoronary bypass graft: Secondary | ICD-10-CM

## 2012-06-22 DIAGNOSIS — E1159 Type 2 diabetes mellitus with other circulatory complications: Secondary | ICD-10-CM | POA: Diagnosis present

## 2012-06-22 DIAGNOSIS — E785 Hyperlipidemia, unspecified: Secondary | ICD-10-CM | POA: Diagnosis present

## 2012-06-22 DIAGNOSIS — N179 Acute kidney failure, unspecified: Secondary | ICD-10-CM

## 2012-06-22 DIAGNOSIS — Z91199 Patient's noncompliance with other medical treatment and regimen due to unspecified reason: Secondary | ICD-10-CM

## 2012-06-22 DIAGNOSIS — C50919 Malignant neoplasm of unspecified site of unspecified female breast: Secondary | ICD-10-CM | POA: Diagnosis present

## 2012-06-22 DIAGNOSIS — I509 Heart failure, unspecified: Secondary | ICD-10-CM | POA: Diagnosis present

## 2012-06-22 DIAGNOSIS — Z8673 Personal history of transient ischemic attack (TIA), and cerebral infarction without residual deficits: Secondary | ICD-10-CM

## 2012-06-22 DIAGNOSIS — R509 Fever, unspecified: Secondary | ICD-10-CM

## 2012-06-22 DIAGNOSIS — J449 Chronic obstructive pulmonary disease, unspecified: Secondary | ICD-10-CM

## 2012-06-22 DIAGNOSIS — A0472 Enterocolitis due to Clostridium difficile, not specified as recurrent: Secondary | ICD-10-CM | POA: Diagnosis present

## 2012-06-22 DIAGNOSIS — J189 Pneumonia, unspecified organism: Secondary | ICD-10-CM | POA: Diagnosis present

## 2012-06-22 DIAGNOSIS — S78119A Complete traumatic amputation at level between unspecified hip and knee, initial encounter: Secondary | ICD-10-CM

## 2012-06-22 DIAGNOSIS — N189 Chronic kidney disease, unspecified: Secondary | ICD-10-CM

## 2012-06-22 DIAGNOSIS — I5033 Acute on chronic diastolic (congestive) heart failure: Principal | ICD-10-CM | POA: Diagnosis present

## 2012-06-22 DIAGNOSIS — M109 Gout, unspecified: Secondary | ICD-10-CM | POA: Diagnosis present

## 2012-06-22 DIAGNOSIS — R0789 Other chest pain: Secondary | ICD-10-CM | POA: Diagnosis present

## 2012-06-22 DIAGNOSIS — I129 Hypertensive chronic kidney disease with stage 1 through stage 4 chronic kidney disease, or unspecified chronic kidney disease: Secondary | ICD-10-CM | POA: Diagnosis present

## 2012-06-22 DIAGNOSIS — I10 Essential (primary) hypertension: Secondary | ICD-10-CM

## 2012-06-22 DIAGNOSIS — I739 Peripheral vascular disease, unspecified: Secondary | ICD-10-CM | POA: Diagnosis present

## 2012-06-22 DIAGNOSIS — R6510 Systemic inflammatory response syndrome (SIRS) of non-infectious origin without acute organ dysfunction: Secondary | ICD-10-CM | POA: Diagnosis present

## 2012-06-22 DIAGNOSIS — N183 Chronic kidney disease, stage 3 unspecified: Secondary | ICD-10-CM | POA: Diagnosis present

## 2012-06-22 DIAGNOSIS — E119 Type 2 diabetes mellitus without complications: Secondary | ICD-10-CM | POA: Diagnosis present

## 2012-06-22 DIAGNOSIS — Z9119 Patient's noncompliance with other medical treatment and regimen: Secondary | ICD-10-CM

## 2012-06-22 DIAGNOSIS — J4489 Other specified chronic obstructive pulmonary disease: Secondary | ICD-10-CM | POA: Diagnosis present

## 2012-06-22 DIAGNOSIS — Z87891 Personal history of nicotine dependence: Secondary | ICD-10-CM

## 2012-06-22 DIAGNOSIS — Z89619 Acquired absence of unspecified leg above knee: Secondary | ICD-10-CM

## 2012-06-22 DIAGNOSIS — D509 Iron deficiency anemia, unspecified: Secondary | ICD-10-CM | POA: Diagnosis present

## 2012-06-22 DIAGNOSIS — I5031 Acute diastolic (congestive) heart failure: Secondary | ICD-10-CM

## 2012-06-22 HISTORY — DX: Peripheral vascular disease, unspecified: I73.9

## 2012-06-22 LAB — CBC WITH DIFFERENTIAL/PLATELET
Basophils Absolute: 0 10*3/uL (ref 0.0–0.1)
HCT: 34.2 % — ABNORMAL LOW (ref 36.0–46.0)
Hemoglobin: 11.1 g/dL — ABNORMAL LOW (ref 12.0–15.0)
Lymphocytes Relative: 10 % — ABNORMAL LOW (ref 12–46)
Lymphs Abs: 1.1 10*3/uL (ref 0.7–4.0)
Monocytes Absolute: 1.1 10*3/uL — ABNORMAL HIGH (ref 0.1–1.0)
Monocytes Relative: 10 % (ref 3–12)
Neutro Abs: 8.7 10*3/uL — ABNORMAL HIGH (ref 1.7–7.7)
RBC: 4.77 MIL/uL (ref 3.87–5.11)
RDW: 17 % — ABNORMAL HIGH (ref 11.5–15.5)
WBC: 10.9 10*3/uL — ABNORMAL HIGH (ref 4.0–10.5)

## 2012-06-22 LAB — CBC
HCT: 33.4 % — ABNORMAL LOW (ref 36.0–46.0)
Hemoglobin: 10.8 g/dL — ABNORMAL LOW (ref 12.0–15.0)
MCH: 23.3 pg — ABNORMAL LOW (ref 26.0–34.0)
MCHC: 32.3 g/dL (ref 30.0–36.0)
MCV: 72.1 fL — ABNORMAL LOW (ref 78.0–100.0)
Platelets: 189 10*3/uL (ref 150–400)
RBC: 4.63 MIL/uL (ref 3.87–5.11)
RDW: 17.2 % — ABNORMAL HIGH (ref 11.5–15.5)
WBC: 10.5 10*3/uL (ref 4.0–10.5)

## 2012-06-22 LAB — GLUCOSE, CAPILLARY
Glucose-Capillary: 58 mg/dL — ABNORMAL LOW (ref 70–99)
Glucose-Capillary: 72 mg/dL (ref 70–99)
Glucose-Capillary: 67 mg/dL — ABNORMAL LOW (ref 70–99)

## 2012-06-22 LAB — URINALYSIS, ROUTINE W REFLEX MICROSCOPIC
Bilirubin Urine: NEGATIVE
Glucose, UA: NEGATIVE mg/dL
Nitrite: NEGATIVE
Specific Gravity, Urine: 1.021 (ref 1.005–1.030)
pH: 5.5 (ref 5.0–8.0)

## 2012-06-22 LAB — URINE MICROSCOPIC-ADD ON

## 2012-06-22 LAB — CREATININE, SERUM
Creatinine, Ser: 1.54 mg/dL — ABNORMAL HIGH (ref 0.50–1.10)
GFR calc Af Amer: 35 mL/min — ABNORMAL LOW (ref >90)
GFR calc non Af Amer: 30 mL/min — ABNORMAL LOW (ref >90)

## 2012-06-22 LAB — POCT I-STAT, CHEM 8
Chloride: 105 mEq/L (ref 96–112)
Creatinine, Ser: 1.6 mg/dL — ABNORMAL HIGH (ref 0.50–1.10)
Glucose, Bld: 105 mg/dL — ABNORMAL HIGH (ref 70–99)
Potassium: 4 mEq/L (ref 3.5–5.1)

## 2012-06-22 LAB — TROPONIN I: Troponin I: 0.39 ng/mL (ref <0.30)

## 2012-06-22 MED ORDER — ISOSORBIDE DINITRATE 20 MG PO TABS
40.0000 mg | ORAL_TABLET | Freq: Two times a day (BID) | ORAL | Status: DC
  Administered 2012-06-22 – 2012-07-01 (×18): 40 mg via ORAL
  Filled 2012-06-22 (×20): qty 2

## 2012-06-22 MED ORDER — RENA-VITE PO TABS
1.0000 | ORAL_TABLET | Freq: Every day | ORAL | Status: DC
  Administered 2012-06-22 – 2012-06-30 (×9): 1 via ORAL
  Filled 2012-06-22 (×11): qty 1

## 2012-06-22 MED ORDER — CLONIDINE HCL 0.2 MG PO TABS: 0.2000 mg | ORAL_TABLET | Freq: Every day | ORAL | Status: DC

## 2012-06-22 MED ORDER — CLONIDINE HCL 0.1 MG PO TABS: 0.1000 mg | ORAL_TABLET | ORAL | Status: DC

## 2012-06-22 MED ORDER — LISINOPRIL 2.5 MG PO TABS
2.5000 mg | ORAL_TABLET | Freq: Every day | ORAL | Status: DC
  Administered 2012-06-23: 2.5 mg via ORAL
  Filled 2012-06-22: qty 1

## 2012-06-22 MED ORDER — INSULIN ASPART 100 UNIT/ML ~~LOC~~ SOLN: 0.0000 [IU] | Freq: Three times a day (TID) | SUBCUTANEOUS | Status: DC

## 2012-06-22 MED ORDER — ENOXAPARIN SODIUM 30 MG/0.3ML ~~LOC~~ SOLN
30.0000 mg | SUBCUTANEOUS | Status: DC
  Administered 2012-06-22 – 2012-06-30 (×9): 30 mg via SUBCUTANEOUS
  Filled 2012-06-22 (×10): qty 0.3

## 2012-06-22 MED ORDER — FUROSEMIDE 20 MG PO TABS
20.0000 mg | ORAL_TABLET | Freq: Every day | ORAL | Status: DC
  Administered 2012-06-23: 20 mg via ORAL
  Filled 2012-06-22: qty 1

## 2012-06-22 MED ORDER — SODIUM CHLORIDE 0.9 % IJ SOLN: 3.0000 mL | INTRAMUSCULAR | Status: DC | PRN

## 2012-06-22 MED ORDER — ALBUTEROL SULFATE (5 MG/ML) 0.5% IN NEBU
5.0000 mg | INHALATION_SOLUTION | Freq: Once | RESPIRATORY_TRACT | Status: AC
  Administered 2012-06-22: 5 mg via RESPIRATORY_TRACT
  Filled 2012-06-22: qty 40

## 2012-06-22 MED ORDER — CLONIDINE HCL 0.3 MG PO TABS
0.6000 mg | ORAL_TABLET | Freq: Every day | ORAL | Status: DC
  Administered 2012-06-22: 0.6 mg via ORAL
  Filled 2012-06-22: qty 2

## 2012-06-22 MED ORDER — INSULIN ASPART 100 UNIT/ML ~~LOC~~ SOLN: 0.0000 [IU] | Freq: Every day | SUBCUTANEOUS | Status: DC

## 2012-06-22 MED ORDER — TIMOLOL MALEATE 0.25 % OP SOLG
1.0000 [drp] | Freq: Every day | OPHTHALMIC | Status: DC
  Administered 2012-06-23 – 2012-07-01 (×9): 1 [drp] via OPHTHALMIC
  Filled 2012-06-22: qty 5

## 2012-06-22 MED ORDER — ACETAMINOPHEN 325 MG PO TABS
650.0000 mg | ORAL_TABLET | ORAL | Status: DC | PRN
  Administered 2012-06-22 – 2012-06-27 (×2): 650 mg via ORAL
  Filled 2012-06-22 (×2): qty 2

## 2012-06-22 MED ORDER — POLYSACCHARIDE IRON COMPLEX 150 MG PO CAPS
150.0000 mg | ORAL_CAPSULE | Freq: Two times a day (BID) | ORAL | Status: DC
  Administered 2012-06-22 – 2012-06-27 (×10): 150 mg via ORAL
  Filled 2012-06-22 (×13): qty 1

## 2012-06-22 MED ORDER — HYDRALAZINE HCL 50 MG PO TABS
75.0000 mg | ORAL_TABLET | Freq: Three times a day (TID) | ORAL | Status: DC
  Administered 2012-06-22 – 2012-06-24 (×5): 75 mg via ORAL
  Filled 2012-06-22 (×7): qty 1

## 2012-06-22 MED ORDER — LORATADINE 10 MG PO TABS
10.0000 mg | ORAL_TABLET | Freq: Every day | ORAL | Status: DC | PRN
  Filled 2012-06-22: qty 1

## 2012-06-22 MED ORDER — AZITHROMYCIN 500 MG PO TABS
500.0000 mg | ORAL_TABLET | Freq: Every day | ORAL | Status: DC
  Administered 2012-06-22 – 2012-06-25 (×4): 500 mg via ORAL
  Filled 2012-06-22 (×4): qty 1

## 2012-06-22 MED ORDER — ASPIRIN EC 81 MG PO TBEC
81.0000 mg | DELAYED_RELEASE_TABLET | Freq: Every day | ORAL | Status: DC
  Administered 2012-06-23 – 2012-07-01 (×9): 81 mg via ORAL
  Filled 2012-06-22 (×9): qty 1

## 2012-06-22 MED ORDER — POLYETHYLENE GLYCOL 3350 17 G PO PACK
17.0000 g | PACK | Freq: Every day | ORAL | Status: DC | PRN
  Filled 2012-06-22: qty 1

## 2012-06-22 MED ORDER — SENNOSIDES-DOCUSATE SODIUM 8.6-50 MG PO TABS
2.0000 | ORAL_TABLET | Freq: Every day | ORAL | Status: DC
  Administered 2012-06-22 – 2012-06-29 (×6): 2 via ORAL
  Filled 2012-06-22 (×11): qty 2

## 2012-06-22 MED ORDER — SODIUM CHLORIDE 0.9 % IV SOLN
250.0000 mL | INTRAVENOUS | Status: DC | PRN
  Administered 2012-06-24: 250 mL via INTRAVENOUS

## 2012-06-22 MED ORDER — AMLODIPINE BESYLATE 10 MG PO TABS
10.0000 mg | ORAL_TABLET | Freq: Every day | ORAL | Status: DC
  Administered 2012-06-22 – 2012-06-24 (×3): 10 mg via ORAL
  Filled 2012-06-22 (×3): qty 1

## 2012-06-22 MED ORDER — FUROSEMIDE 10 MG/ML IJ SOLN
80.0000 mg | Freq: Once | INTRAMUSCULAR | Status: AC
  Administered 2012-06-22: 80 mg via INTRAVENOUS
  Filled 2012-06-22: qty 8

## 2012-06-22 MED ORDER — ACETAMINOPHEN 325 MG PO TABS
650.0000 mg | ORAL_TABLET | Freq: Once | ORAL | Status: AC
  Administered 2012-06-22: 650 mg via ORAL
  Filled 2012-06-22: qty 2

## 2012-06-22 MED ORDER — CLONIDINE HCL 0.3 MG PO TABS
0.3000 mg | ORAL_TABLET | ORAL | Status: DC
  Filled 2012-06-22: qty 1

## 2012-06-22 MED ORDER — HYDROCODONE-ACETAMINOPHEN 5-325 MG PO TABS
1.0000 | ORAL_TABLET | Freq: Four times a day (QID) | ORAL | Status: DC | PRN
  Administered 2012-06-25: 2 via ORAL
  Administered 2012-06-28: 1 via ORAL
  Administered 2012-06-28 – 2012-07-01 (×3): 2 via ORAL
  Filled 2012-06-22 (×2): qty 2
  Filled 2012-06-22: qty 1
  Filled 2012-06-22 (×2): qty 2

## 2012-06-22 MED ORDER — CITALOPRAM HYDROBROMIDE 10 MG PO TABS
10.0000 mg | ORAL_TABLET | Freq: Every day | ORAL | Status: DC
  Administered 2012-06-23 – 2012-06-28 (×6): 10 mg via ORAL
  Administered 2012-06-29: 10:00:00 via ORAL
  Administered 2012-06-30 – 2012-07-01 (×2): 10 mg via ORAL
  Filled 2012-06-22 (×9): qty 1

## 2012-06-22 MED ORDER — SODIUM CHLORIDE 0.9 % IJ SOLN
3.0000 mL | Freq: Two times a day (BID) | INTRAMUSCULAR | Status: DC
  Administered 2012-06-23 – 2012-07-01 (×14): 3 mL via INTRAVENOUS

## 2012-06-22 MED ORDER — CLONIDINE HCL 0.3 MG PO TABS
0.6000 mg | ORAL_TABLET | Freq: Every day | ORAL | Status: DC
  Filled 2012-06-22: qty 2

## 2012-06-22 MED ORDER — CLONIDINE HCL 0.3 MG PO TABS: 0.3000 mg | ORAL_TABLET | Freq: Every day | ORAL | Status: DC

## 2012-06-22 MED ORDER — PIPERACILLIN-TAZOBACTAM 3.375 G IVPB
3.3750 g | Freq: Three times a day (TID) | INTRAVENOUS | Status: DC
  Administered 2012-06-22 – 2012-06-25 (×8): 3.375 g via INTRAVENOUS
  Filled 2012-06-22 (×10): qty 50

## 2012-06-22 MED ORDER — FAMOTIDINE 20 MG PO TABS
20.0000 mg | ORAL_TABLET | Freq: Every day | ORAL | Status: DC
  Administered 2012-06-23 – 2012-07-01 (×9): 20 mg via ORAL
  Filled 2012-06-22 (×9): qty 1

## 2012-06-22 MED ORDER — PIPERACILLIN-TAZOBACTAM IN DEX 2-0.25 GM/50ML IV SOLN: 2.2500 g | Freq: Four times a day (QID) | INTRAVENOUS | Status: DC

## 2012-06-22 MED ORDER — SENNA-DOCUSATE SODIUM 8.6-50 MG PO TABS: 2.0000 | ORAL_TABLET | Freq: Every day | ORAL | Status: DC

## 2012-06-22 MED ORDER — EZETIMIBE 10 MG PO TABS
10.0000 mg | ORAL_TABLET | Freq: Every day | ORAL | Status: DC
  Administered 2012-06-23 – 2012-07-01 (×9): 10 mg via ORAL
  Filled 2012-06-22 (×9): qty 1

## 2012-06-22 MED ORDER — ALLOPURINOL 100 MG PO TABS
200.0000 mg | ORAL_TABLET | Freq: Every evening | ORAL | Status: DC
  Administered 2012-06-22 – 2012-06-24 (×3): 200 mg via ORAL
  Filled 2012-06-22 (×4): qty 2

## 2012-06-22 MED ORDER — ONDANSETRON HCL 4 MG/2ML IJ SOLN: 4.0000 mg | Freq: Four times a day (QID) | INTRAMUSCULAR | Status: DC | PRN

## 2012-06-22 MED ORDER — CITALOPRAM HYDROBROMIDE 20 MG PO TABS: 20.0000 mg | ORAL_TABLET | Freq: Every day | ORAL | Status: DC

## 2012-06-22 MED ORDER — ACETAMINOPHEN 325 MG PO TABS
650.0000 mg | ORAL_TABLET | ORAL | Status: DC | PRN
  Filled 2012-06-22: qty 2

## 2012-06-22 NOTE — Progress Notes (Signed)
PATIENT ARRIVED TO UNIT FROM ED VIA STRETCHER. PATIENT ALERT AND ORIENTED. REVIEWED FALL AND SAFETY PLAN WITH PATIENT, CALL LIGHT WITHIN REACH. WILL CONTINUE TO MONITOR.  [name]

## 2012-06-22 NOTE — H&P (Signed)
[facility] History and Physical  [name] BJY:[id number] DOB: 1927/01/21 DOA: 06/22/2012  Referring physician: ED physician PCP: Thora Lance, MD   Chief Complaint: Shortness of breath and fever  HPI:  This is a pleasant 78 year old female who presents to the hospital with a two-day history of shortness of breath and coughing yellow-brown sputum. The patient has had a chronic cough but states that over the past 2 days she has been producing more brown-yellow sputum. The patient actually has not been feeling well for the past 2 weeks. She states that she has had no energy and has had decreased oral intake. She went to see her primary care provider approximately 4 weeks ago at which time her furosemide was decreased from 40 mg twice a day to 20 mg daily. Even with this change, the patient only intermittently takes the furosemide, and in the past week she has not taken it at all. She states that she feels too tired to up to get the medicines.  This morning, the patient was noted to have a fever of 101.57F at home. She was also noted to have some shortness of breath at which time her family urged her to come to the emergency department. In addition, the patient has been complaining of orthopnea for the past 2 days. She also complains of some chest pain without any radiation to her jaw or her arms. She states she has been compliant with her antihypertensive medications and, but her systolic blood pressure home normally runs 150-160. In addition, the patient has noted some abdominal pain which began on Thursday. She describes it as epigastric in nature without any nausea, vomiting, diarrhea, hematochezia, melena. She denies any new rashes.  In the emergency department, the patient was noted to have a fever of 102.17F. Unfortunately blood cultures were obtained. The patient was given furosemide with improvement of her shortness of breath and orthopnea.  Assessment and Plan: Acute  exacerbation of diastolic heart failure -The patient had a Lexiscan on 06/08/2011 showing ejection fraction of 62% -Contributing factors include noncompliance with furosemide as well as underlying infection -Patient clinically improved after one IV dose of furosemide 80 mg -Will restart her oral furosemide in the morning -Echocardiogram and lisinopril will be started. -Check TSH Fever/SIRS -Source is not 100% clear at this time although I suspect the patient has pneumonia given her chest x-ray findings and clinical presentation -UA did not suggest a UTI -Blood cultures x2 sets -Empiric Zosyn and azithromycin were started -Urine Legionella antigen and Streptococcus pneumoniae antigen Hypertension Atypical chest pain -Check troponins and EKG Diabetes mellitus type 2 -check a hemoglobin A1c Hyperlipidemia History of breast cancer -Status post lumpectomy with radiation and tamoxifen Peripheral vascular disease -Status post right above-the-knee amputation Chronic kidney disease stage III Coronary artery disease status post CABG  Code Status: Full Family Communication: Pt at bedside Disposition Plan: PT evaluation    Review of Systems:  Constitutional:  Negative for diaphoresis.  HENT: Negative for hearing loss, ear pain, nosebleeds, congestion, sore throat, neck pain, tinnitus and ear discharge.   Eyes: Negative for blurred vision, double vision, photophobia, pain, discharge and redness.  Respiratory: Negative for sputum production, shortness of breath Cardiovascular: Negative for palpitations, orthopnea, claudication and leg swelling.  Gastrointestinal: Negative for nausea, vomiting and  Negative for heartburn, constipation, blood in stool and melena.  Genitourinary: Negative for dysuria, urgency, frequency, hematuria and flank pain.  Musculoskeletal: Negative for myalgias, back pain, joint pain and falls.  Skin: Negative for itching  and rash.  Neurological: Negative for  dizziness and weakness. Negative for tingling, tremors, sensory change, speech change, focal weakness, loss of consciousness and headaches.   Psychiatric/Behavioral: Negative for suicidal ideas. The patient is not nervous/anxious.      Past Medical History  Diagnosis Date  . DM (diabetes mellitus) type II controlled peripheral vascular disorder   . HTN (hypertension)   . CHF (congestive heart failure)   . CKD (chronic kidney disease)   . CAD (coronary artery disease)   . COPD (chronic obstructive pulmonary disease)   . Cataracts, bilateral   . Diabetes mellitus   . Hyperlipidemia   . CAD (coronary artery disease)   . S/P femoral-popliteal bypass surgery bilateral  . Glaucoma   . TIA (transient ischemic attack)   . Chronic kidney disease     Past Surgical History  Procedure Date  . Coronary artery bypass graft   . Spine surgery   . Eye surgery cataract  . Carpal tunnel release left  . Breast surgery lumpectomy right  . Aortogram w/ runoff 06/06/11    right leg  . Above knee leg amputation 07/27/11    Right AKA    Social History:  reports that she quit smoking about 35 years ago. Her smoking use included Cigarettes. She has a 40 pack-year smoking history. She has never used smokeless tobacco. She reports that she does not drink alcohol or use illicit drugs.  Allergies  Allergen Reactions  . Codeine   . Omnipaque (Iohexol)   . Celebrex (Celecoxib) Rash  . Iodine Rash    Family History  Problem Relation Age of Onset  . Hypertension Mother   . Peripheral vascular disease Mother   . Diabetes Father   . Kidney disease Sister   . Cancer Brother     lung  . Lung disease Sister   . Lung disease Sister     Prior to Admission medications   Medication Sig Start Date End Date Taking? Authorizing Provider  allopurinol (ZYLOPRIM) 100 MG tablet Take 200 mg by mouth every evening.    Yes Historical Provider, MD  amLODipine (NORVASC) 5 MG tablet Take 10 mg by mouth daily.     Yes Historical Provider, MD  aspirin EC 81 MG tablet Take 81 mg by mouth daily.   Yes Historical Provider, MD  b complex-vitamin c-folic acid (NEPHRO-VITE) 0.8 MG TABS Take 0.8 mg by mouth at bedtime.     Yes Historical Provider, MD  citalopram (CELEXA) 10 MG tablet Take 20 mg by mouth daily.    Yes Historical Provider, MD  cloNIDine (CATAPRES) 0.3 MG tablet Take 0.3-0.6 mg by mouth 3 (three) times daily. Take 0.3 MG in the morning, take 0.3 MG in the afternoon, and take 0.6 MG at night.   Yes Historical Provider, MD  clotrimazole (LOTRIMIN) 1 % external solution Apply 1 application topically as needed. Apply to fungal rash on foot   Yes Historical Provider, MD  colchicine 0.6 MG tablet Take 0.6 mg by mouth 2 (two) times daily as needed. Once or twice a day as needed for acute gout flare   Yes Historical Provider, MD  ezetimibe (ZETIA) 10 MG tablet Take 10 mg by mouth daily.     Yes Historical Provider, MD  famotidine (PEPCID) 20 MG tablet Take 20 mg by mouth daily.    Yes Historical Provider, MD  Filgrastim (NEUPOGEN IJ) Inject as directed 2 (two) times a week. Twice a month.   Yes Historical Provider, MD  furosemide (LASIX) 40 MG tablet Take 20 mg by mouth daily.    Yes Historical Provider, MD  glimepiride (AMARYL) 2 MG tablet Take 2 mg by mouth daily before breakfast.     Yes Historical Provider, MD  glucosamine-chondroitin 500-400 MG tablet Take 3 tablets by mouth 3 (three) times daily.    Yes Historical Provider, MD  hydrALAZINE (APRESOLINE) 50 MG tablet Take 75 mg by mouth 3 (three) times daily.    Yes Historical Provider, MD  HYDROcodone-acetaminophen (NORCO) 5-325 MG per tablet Take 1-2 tablets by mouth every 6 (six) hours as needed. For pain   Yes Historical Provider, MD  isosorbide dinitrate (ISORDIL) 40 MG tablet Take 40 mg by mouth 2 (two) times daily.    Yes Historical Provider, MD  loratadine (CLARITIN) 10 MG tablet Take 10 mg by mouth daily as needed. For allergies.   Yes Historical  Provider, MD  mupirocin (BACTROBAN) 2 % ointment Apply 1 application topically 2 (two) times daily as needed. For skin breakouts.   Yes Historical Provider, MD  oxycodone (OXY-IR) 5 MG capsule Take 5 mg by mouth every 4 (four) hours as needed. For pain.   Yes Historical Provider, MD  oxyCODONE-acetaminophen (PERCOCET) 7.5-325 MG per tablet Take 1 tablet by mouth every 6 (six) hours as needed. For pain.   Yes Historical Provider, MD  polyethylene glycol (MIRALAX / GLYCOLAX) packet Take 17 g by mouth daily as needed. For constipation.   Yes Historical Provider, MD  Polysaccharide Iron Complex (FERREX 150 PO) Take 1 tablet by mouth 2 (two) times daily.    Yes Historical Provider, MD  sennosides-docusate sodium (SENOKOT-S) 8.6-50 MG tablet Take 2 tablets by mouth daily.    Yes Historical Provider, MD  timolol (TIMOPTIC-XR) 0.25 % ophthalmic gel-forming Place 1 drop into both eyes daily.    Yes Historical Provider, MD  triamcinolone (KENALOG) 0.1 % cream Apply 1 application topically 2 (two) times daily as needed. For skin breakouts.   Yes Historical Provider, MD    Physical Exam: Filed Vitals:   06/22/12 1149 06/22/12 1441 06/22/12 1718  BP: 144/115 124/36 194/80  Pulse: 95 78 104  Temp: 102.3 F (39.1 C) 98.3 F (36.8 C) 98.4 F (36.9 C)  TempSrc: Oral  Oral  Resp: 14 16 22   SpO2: 92% 94% 92%    Physical Exam  Constitutional: Appears well-developed and well-nourished. No distress.  HENT: Normocephalic. External right and left ear normal. Oropharynx is clear and moist.  Eyes: Conjunctivae and EOM are normal. PERRLA, no scleral icterus.  Neck: Normal ROM. Neck supple. No JVD. No tracheal deviation. No thyromegaly.  CVS: RRR, S1/S2 + no gallops, no carotid bruit.  Pulmonary: Bibasilar crackles. Crackles and rales left lung. No wheezes or rhonchi Abdominal: Soft. BS +,  no distension, , rebound or guarding. Mild epigastric tenderness  Musculoskeletal: Status post left above-the-knee  amputation.amputation site without any open wounds or erythema  Lymphadenopathy: No lymphadenopathy noted, cervical, inguinal. Neuro: Alert. Normal reflexes, muscle tone coordination. No cranial nerve deficit. Skin: Skin is warm and dry. No rash noted. Not diaphoretic. No erythema. No pallor.  right breast and axilla without any open wounds, nodules, lumps, or ulcerations or erythema    Labs on Admission:  Basic Metabolic Panel:  Lab 06/22/12 4403  NA 136  K 4.0  CL 105  CO2 --  GLUCOSE 105*  BUN 25*  CREATININE 1.60*  CALCIUM --  MG --  PHOS --   Liver Function Tests: No results found  for this basename: AST:5,ALT:5,ALKPHOS:5,BILITOT:5,PROT:5,ALBUMIN:5 in the last 168 hours No results found for this basename: LIPASE:5,AMYLASE:5 in the last 168 hours No results found for this basename: AMMONIA:5 in the last 168 hours CBC:  Lab 06/22/12 1226 06/22/12 1159 06/19/12 1214  WBC -- 10.9* --  NEUTROABS -- 8.7* --  HGB 12.6 11.1* 9.5*  HCT 37.0 34.2* --  MCV -- 71.7* --  PLT -- 217 --   Cardiac Enzymes: No results found for this basename: CKTOTAL:5,CKMB:5,CKMBINDEX:5,TROPONINI:5 in the last 168 hours BNP: No components found with this basename: POCBNP:5 CBG: No results found for this basename: GLUCAP:5 in the last 168 hours  Radiological Exams on Admission: Dg Chest 2 View  06/22/2012  *RADIOLOGY REPORT*  Clinical Data: Cough.  Fever.  CHEST - 2 VIEW  Comparison: Two-view chest x-ray 06/05/2011, 04/01/2007, 09/04/2006.  Findings: Prior sternotomy CABG.  Cardiac silhouette enlarged but stable.  Thoracic aorta atherosclerotic, unchanged.  Mildly prominent central pulmonary arteries, unchanged.  Interval development of mild diffuse interstitial pulmonary edema and small bilateral pleural effusions.  Severe degenerative changes involving the right shoulder.  Degenerative changes and DISH involving the thoracic spine.  IMPRESSION: Mild CHF, with stable cardiomegaly, mild diffuse  interstitial pulmonary edema, and small bilateral pleural effusions.  Original Report Authenticated By: Arnell Sieving, M.D.    Almarosa Bohac, MD  [facility] Pager [phone]  If 7PM-7AM, please contact night-coverage [email or web address] Password [facility] 06/22/2012, 5:31 PM

## 2012-06-22 NOTE — Progress Notes (Signed)
CRITICAL VALUE ALERT  Critical value received: TROPONIN 0.39  Date of notification:  06/22/12  Time of notification: 2255 Critical value read back:YES   Nurse who received alert: Tobias Alexander  MD notified (1st page): [facility]   Time of first page: 2256 MD notified (2nd page):  Time of second page:  Responding MD: [facility], NO NEW ORDERS GIVEN AT THIS TIME. WILL CONTINUE TO MONITOR. Time MD responded:  [phone] Troy Sine

## 2012-06-22 NOTE — ED Notes (Signed)
Spoke w/ Dr. Arbutus Leas via telephone to notified of pt's temp of 102.6 orally - d/t pt being transported to the floor shortly, Dr. Arbutus Leas requesting to hold tylenol at this time so that pt may have blood cultures drawn and antibiotics to be given. No further orders given at this time.

## 2012-06-22 NOTE — ED Notes (Signed)
Pt arrived by [facility]. Daughter reports that pt woke up this am with sob, having nausea and temp 102.on ems arrival, spo2 93% on room air, upper bilateral wheezing.

## 2012-06-22 NOTE — Plan of Care (Signed)
Problem: Phase I Progression Outcomes Goal: EF % per last Echo/documented,Core Reminder form on chart LAST ECHO 11/2005 WITH EF 55%

## 2012-06-22 NOTE — ED Provider Notes (Signed)
History     CSN: [id number]  Arrival date & time 06/22/12  1139   First MD Initiated Contact with Patient 06/22/12 1140      Chief Complaint  Patient presents with  . Shortness of Breath    (Consider location/radiation/quality/duration/timing/severity/associated sxs/prior treatment) HPI Pt with history of multiple medical problems reports 2 days of increasing SOB, productive cough and fever. She has not had any CP, reports some wheezing at home. She has not taken her Lasix in 2 days because she hasn't felt well. She denies any dysuria.  Past Medical History  Diagnosis Date  . DM (diabetes mellitus) type II controlled peripheral vascular disorder   . HTN (hypertension)   . CHF (congestive heart failure)   . CKD (chronic kidney disease)   . CAD (coronary artery disease)   . COPD (chronic obstructive pulmonary disease)   . Cataracts, bilateral   . Diabetes mellitus   . Hyperlipidemia   . CAD (coronary artery disease)   . S/P femoral-popliteal bypass surgery bilateral  . Glaucoma   . TIA (transient ischemic attack)   . Chronic kidney disease     Past Surgical History  Procedure Date  . Coronary artery bypass graft   . Spine surgery   . Eye surgery cataract  . Carpal tunnel release left  . Breast surgery lumpectomy right  . Aortogram w/ runoff 06/06/11    right leg  . Above knee leg amputation 07/27/11    Right AKA    Family History  Problem Relation Age of Onset  . Hypertension Mother   . Peripheral vascular disease Mother   . Diabetes Father   . Kidney disease Sister   . Cancer Brother     lung  . Lung disease Sister   . Lung disease Sister     History  Substance Use Topics  . Smoking status: Former Smoker -- 1.0 packs/day for 40 years    Types: Cigarettes    Quit date: 04/13/1977  . Smokeless tobacco: Never Used  . Alcohol Use: No    OB History    Grav Para Term Preterm Abortions TAB SAB Ect Mult Living                  Review of Systems All  other systems reviewed and are negative except as noted in HPI.   Allergies  Codeine; Omnipaque; Celebrex; and Iodine  Home Medications   Current Outpatient Rx  Name Route Sig Dispense Refill  . ALLOPURINOL 100 MG PO TABS Oral Take 100 mg by mouth. Take 2 tabs daily to equal 200 mg    . AMLODIPINE BESYLATE 5 MG PO TABS Oral Take 10 mg by mouth daily.     . ASPIRIN 81 MG PO TABS Oral Take 81 mg by mouth daily.      Marland Kitchen NEPHRO-VITE 0.8 MG PO TABS Oral Take 0.8 mg by mouth at bedtime.      . BUPROPION HCL ER (SR) 150 MG PO TB12 Oral Take 150 mg by mouth daily.      Marland Kitchen CITALOPRAM HYDROBROMIDE 10 MG PO TABS Oral Take 10 mg by mouth daily.    Marland Kitchen CLONIDINE HCL 0.1 MG PO TABS Oral Take 0.1 mg by mouth 2 (two) times daily.      Marland Kitchen CLOTRIMAZOLE 1 % EX SOLN Topical Apply topically as needed. Apply to fungal rash on foot     . COLCHICINE 0.6 MG PO TABS Oral Take 0.6 mg by mouth daily. 1 tablet  once or twice a day as needed for acute gout flare     . ECONAZOLE NITRATE 1 % EX CREA Topical Apply 1 application topically 2 (two) times daily as needed.     Marland Kitchen EZETIMIBE 10 MG PO TABS Oral Take 10 mg by mouth daily.      Marland Kitchen FAMOTIDINE 20 MG PO TABS Oral Take 20 mg by mouth 2 (two) times daily.      Marland Kitchen FERRIC SUBSULFATE 259 MG/GM EX SOLN Topical Apply topically once.      . NEUPOGEN IJ Injection Inject as directed every 30 (thirty) days.      . FUROSEMIDE 40 MG PO TABS Oral Take 40 mg by mouth. Take 1/2 tab two times/day    . GLIMEPIRIDE 2 MG PO TABS Oral Take 2 mg by mouth daily before breakfast.      . GLUCOSAMINE-CHONDROITIN 500-400 MG PO TABS Oral Take 1 tablet by mouth 2 (two) times daily.      . GLYBURIDE MICRONIZED 6 MG PO TABS Oral Take 6 mg by mouth daily with breakfast.      . HYDRALAZINE HCL 50 MG PO TABS Oral Take 50 mg by mouth. 1 1/2 tablet orally three times a day    . HYDROCODONE-ACETAMINOPHEN 5-325 MG PO TABS Oral Take 1 tablet by mouth every 6 (six) hours as needed. 1-2 tablets every 6 hours as  needed for pain     . ISOSORBIDE DINITRATE 40 MG PO TABS Oral Take 40 mg by mouth 2 (two) times daily at 10 am and 4 pm.      . LORATADINE 10 MG PO TABS Oral Take 10 mg by mouth daily.    Marland Kitchen MUPIROCIN 2 % EX OINT Topical Apply 1 application topically 2 (two) times daily as needed.     . OXYCODONE HCL 5 MG PO CAPS Oral Take 5 mg by mouth every 4 (four) hours as needed.      . OXYCODONE-ACETAMINOPHEN 7.5-325 MG PO TABS Oral Take 1 tablet by mouth every 6 (six) hours as needed.      Marland Kitchen POLYETHYLENE GLYCOL 3350 PO PACK Oral Take 17 g by mouth daily.      Marland Kitchen FERREX 150 PO Oral Take 1 tablet by mouth daily.      . SENNA-DOCUSATE SODIUM 8.6-50 MG PO TABS Oral Take 2 tablets by mouth as needed.      . SODIUM CHLORIDE 0.9 % IR SOLN Irrigation Irrigate with as directed once.      Marland Kitchen TIMOLOL MALEATE 0.25 % OP SOLG Both Eyes Place 1 drop into both eyes daily.      . TRIAMCINOLONE ACETONIDE 0.1 % EX CREA Topical Apply topically 2 (two) times daily. Sparingly ro affected area as needed BID     . VITAMIN D (ERGOCALCIFEROL) 50000 UNITS PO CAPS Oral Take 50,000 Units by mouth. 1 capsule once a month        BP 144/115  Pulse 95  Temp 102.3 F (39.1 C) (Oral)  Resp 14  SpO2 92%  Physical Exam  Nursing note and vitals reviewed. Constitutional: She is oriented to person, place, and time. She appears well-developed and well-nourished.  HENT:  Head: Normocephalic and atraumatic.  Eyes: EOM are normal. Pupils are equal, round, and reactive to light.  Neck: Normal range of motion. Neck supple.  Cardiovascular: Normal rate, normal heart sounds and intact distal pulses.   Pulmonary/Chest: Effort normal. She has wheezes.  Abdominal: Bowel sounds are normal. She exhibits no distension.  There is no tenderness.  Musculoskeletal: Normal range of motion. She exhibits no edema and no tenderness.       R AKA  Neurological: She is alert and oriented to person, place, and time. She has normal strength. No cranial nerve  deficit or sensory deficit.  Skin: Skin is warm and dry. No rash noted.  Psychiatric: She has a normal mood and affect.    ED Course  Procedures (including critical care time)  Labs Reviewed  CBC WITH DIFFERENTIAL - Abnormal; Notable for the following:    WBC 10.9 (*)     Hemoglobin 11.1 (*)     HCT 34.2 (*)     MCV 71.7 (*)     MCH 23.3 (*)     RDW 17.0 (*)     Neutrophils Relative 80 (*)     Neutro Abs 8.7 (*)     Lymphocytes Relative 10 (*)     Monocytes Absolute 1.1 (*)     All other components within normal limits  URINALYSIS, ROUTINE W REFLEX MICROSCOPIC - Abnormal; Notable for the following:    APPearance CLOUDY (*)     Protein, ur >300 (*)     All other components within normal limits  POCT I-STAT, CHEM 8 - Abnormal; Notable for the following:    BUN 25 (*)     Creatinine, Ser 1.60 (*)     Glucose, Bld 105 (*)     All other components within normal limits  URINE MICROSCOPIC-ADD ON - Abnormal; Notable for the following:    Squamous Epithelial / LPF FEW (*)     Casts HYALINE CASTS (*)  GRANULAR CAST   All other components within normal limits  LACTIC ACID, PLASMA  PROCALCITONIN  URINE CULTURE   Dg Chest 2 View  06/22/2012  *RADIOLOGY REPORT*  Clinical Data: Cough.  Fever.  CHEST - 2 VIEW  Comparison: Two-view chest x-ray 06/05/2011, 04/01/2007, 09/04/2006.  Findings: Prior sternotomy CABG.  Cardiac silhouette enlarged but stable.  Thoracic aorta atherosclerotic, unchanged.  Mildly prominent central pulmonary arteries, unchanged.  Interval development of mild diffuse interstitial pulmonary edema and small bilateral pleural effusions.  Severe degenerative changes involving the right shoulder.  Degenerative changes and DISH involving the thoracic spine.  IMPRESSION: Mild CHF, with stable cardiomegaly, mild diffuse interstitial pulmonary edema, and small bilateral pleural effusions.  Original Report Authenticated By: Arnell Sieving, M.D.     No diagnosis  found.    MDM   Date: 06/22/2012  Rate: 93  Rhythm: normal sinus rhythm  QRS Axis: right  Intervals: normal  ST/T Wave abnormalities: nonspecific T wave changes  Conduction Disutrbances:right bundle branch block  Narrative Interpretation:   Old EKG Reviewed: unchanged   4:07 PM Pt has pulm edema but no infiltrate on CXR. UA unremarkable. No clear source of fever. Will give Lasix for CHF, admit for further eval. Pt and family decline foley catheter.       Charles B. Bernette Mayers, MD 06/22/12 [phone]

## 2012-06-23 ENCOUNTER — Inpatient Hospital Stay (HOSPITAL_COMMUNITY): Payer: Medicare Other

## 2012-06-23 DIAGNOSIS — I10 Essential (primary) hypertension: Secondary | ICD-10-CM

## 2012-06-23 DIAGNOSIS — J189 Pneumonia, unspecified organism: Secondary | ICD-10-CM

## 2012-06-23 LAB — URINE CULTURE: Colony Count: NO GROWTH

## 2012-06-23 LAB — GLUCOSE, CAPILLARY
Glucose-Capillary: 95 mg/dL (ref 70–99)
Glucose-Capillary: 105 mg/dL — ABNORMAL HIGH (ref 70–99)
Glucose-Capillary: 102 mg/dL — ABNORMAL HIGH (ref 70–99)

## 2012-06-23 LAB — BASIC METABOLIC PANEL
CO2: 24 mEq/L (ref 19–32)
Chloride: 104 mEq/L (ref 96–112)
Creatinine, Ser: 1.92 mg/dL — ABNORMAL HIGH (ref 0.50–1.10)
Glucose, Bld: 98 mg/dL (ref 70–99)

## 2012-06-23 LAB — CBC WITH DIFFERENTIAL/PLATELET
Basophils Absolute: 0 10*3/uL (ref 0.0–0.1)
Lymphocytes Relative: 8 % — ABNORMAL LOW (ref 12–46)
Neutro Abs: 6.9 10*3/uL (ref 1.7–7.7)
Neutrophils Relative %: 79 % — ABNORMAL HIGH (ref 43–77)
Platelets: 152 10*3/uL (ref 150–400)
RDW: 16.9 % — ABNORMAL HIGH (ref 11.5–15.5)
WBC: 8.8 10*3/uL (ref 4.0–10.5)

## 2012-06-23 LAB — TSH: TSH: 3.344 u[IU]/mL (ref 0.350–4.500)

## 2012-06-23 LAB — STREP PNEUMONIAE URINARY ANTIGEN: Strep Pneumo Urinary Antigen: NEGATIVE

## 2012-06-23 LAB — HEMOGLOBIN A1C: Mean Plasma Glucose: 120 mg/dL — ABNORMAL HIGH (ref <117)

## 2012-06-23 MED ORDER — CLONIDINE HCL 0.2 MG PO TABS
0.2000 mg | ORAL_TABLET | Freq: Every day | ORAL | Status: DC
  Filled 2012-06-23 (×2): qty 1

## 2012-06-23 MED ORDER — CLONIDINE HCL 0.1 MG PO TABS
0.1000 mg | ORAL_TABLET | Freq: Two times a day (BID) | ORAL | Status: DC
  Filled 2012-06-23: qty 1

## 2012-06-23 MED ORDER — CLONIDINE HCL 0.1 MG PO TABS: 0.1000 mg | ORAL_TABLET | ORAL | Status: DC

## 2012-06-23 MED ORDER — CARVEDILOL 3.125 MG PO TABS
3.1250 mg | ORAL_TABLET | Freq: Two times a day (BID) | ORAL | Status: DC
  Administered 2012-06-23 – 2012-06-27 (×8): 3.125 mg via ORAL
  Filled 2012-06-23 (×10): qty 1

## 2012-06-23 MED ORDER — CLONIDINE HCL 0.2 MG PO TABS: 0.2000 mg | ORAL_TABLET | Freq: Two times a day (BID) | ORAL | Status: DC

## 2012-06-23 MED ORDER — CLONIDINE HCL 0.2 MG PO TABS
0.3000 mg | ORAL_TABLET | Freq: Two times a day (BID) | ORAL | Status: DC
  Administered 2012-06-23 – 2012-06-25 (×4): 0.3 mg via ORAL
  Filled 2012-06-23 (×5): qty 1.5

## 2012-06-23 MED ORDER — CLONIDINE HCL 0.1 MG PO TABS
0.1000 mg | ORAL_TABLET | ORAL | Status: DC
  Administered 2012-06-23: 10:00:00 via ORAL
  Filled 2012-06-23 (×3): qty 1

## 2012-06-23 NOTE — Progress Notes (Signed)
Hypoglycemic Event  CBG: 67 Treatment: snack, juice Symptoms: none Follow-up CBG: Time: 2145 CBG Result:106  Possible Reasons for Event: illness Comments/MD notified:   Troy Sine  Remember to initiate Hypoglycemia Order Set & complete

## 2012-06-23 NOTE — Progress Notes (Signed)
  Echocardiogram 2D Echocardiogram has been performed.  Stephaun Million FRANCES 06/23/2012, 4:47 PM

## 2012-06-23 NOTE — Progress Notes (Signed)
Hypoglycemic Event  CBG: 64 Treatment: peanut butter & cracker (snack) Symptoms:none Follow-up CBG: Time: CBG Result:  Possible Reasons for Event: illness Comments/MD notified:   Troy Sine  Remember to initiate Hypoglycemia Order Set & complete

## 2012-06-23 NOTE — Progress Notes (Signed)
[facility] PROGRESS NOTE  [name] ZOX:[id number] DOB: 29-Sep-1927 DOA: 06/22/2012 PCP: Thora Lance, MD  Assessment/Plan: Acute exacerbation of diastolic heart failure  -The patient had a Lexiscan on 06/08/2011 showing ejection fraction of 62%  -Overall clinically improved -I.'s and O.'s questionable reliability, no Foley catheter -Await echocardiogram Pneumonia -Continue Zosyn and azithromycin at this time pending culture data -Fever starting to trend down after initiation of antibiotics Hypertension  -Suboptimal control, but improving -Add carvedilol in light of CHF Abnormal troponins -Likely demand ischemia, CHF heart strain Diabetes mellitus type 2  -Given her hemoglobin A1c and her age, discontinue Accu-Cheks -Hemoglobin A1c 5.8 Hyperlipidemia  History of breast cancer  -Status post lumpectomy with radiation and tamoxifen  Peripheral vascular disease  -Status post right above-the-knee amputation  Chronic kidney disease stage III  -Creatinine worse due to diuresis, continue to monitor Coronary artery disease status post CABG    Procedures/Studies: Dg Chest 2 View  06/23/2012  *RADIOLOGY REPORT*  Clinical Data: CHF, COPD  CHEST - 2 VIEW  Comparison: 06/22/2012; 06/05/2011; 04/01/2007  Findings: Grossly unchanged cardiac silhouette and mediastinal contours post median sternotomy and CABG.  Pulmonary vasculature remains indistinct.  Grossly unchanged bibasilar heterogeneous opacities, left greater than right.  Small left-sided effusion is suspected.  No definite pneumothorax.  Grossly unchanged bones including moderate to severe degenerative changes of the thoracolumbar spine.  Extensive degenerative change of the bilateral glenohumeral joints, right greater than left. Surgical clips overlie the right axilla.  IMPRESSION: Overall findings suggestive of pulmonary edema, though note, underlying infection is not excluded.  Original Report Authenticated By: Waynard Reeds, M.D.   Dg Chest 2 View  06/22/2012  *RADIOLOGY REPORT*  Clinical Data: Cough.  Fever.  CHEST - 2 VIEW  Comparison: Two-view chest x-ray 06/05/2011, 04/01/2007, 09/04/2006.  Findings: Prior sternotomy CABG.  Cardiac silhouette enlarged but stable.  Thoracic aorta atherosclerotic, unchanged.  Mildly prominent central pulmonary arteries, unchanged.  Interval development of mild diffuse interstitial pulmonary edema and small bilateral pleural effusions.  Severe degenerative changes involving the right shoulder.  Degenerative changes and DISH involving the thoracic spine.  IMPRESSION: Mild CHF, with stable cardiomegaly, mild diffuse interstitial pulmonary edema, and small bilateral pleural effusions.  Original Report Authenticated By: Arnell Sieving, M.D.   Dg Abd 2 Views  06/23/2012  *RADIOLOGY REPORT*  Clinical Data: Abdominal pain, COPD  ABDOMEN - 2 VIEW  Comparison: Chest radiograph - earlier same day; lumbar spine radiographs - 04/03/2006  Findings: There is mild gaseous distension of the colon without definite evidence of obstruction.  Moderate colonic stool burden. No definite pneumoperitoneum on the prior lateral decubitus radiograph.  No definite pneumatosis or portal venous gas. Extensive atherosclerotic calcifications within the abdominal aorta and iliac vasculature.  Limited visualization of the lower thorax suggests an enlarged cardiac silhouette with bibasilar opacities, possibly atelectasis.  Extensive multilevel thoracolumbar spine degenerative change.  IMPRESSION: Moderate colonic stool burden and gaseous distension of the colon most suggestive of ileus.  No definite evidence of obstruction.  Original Report Authenticated By: Waynard Reeds, M.D.     Antibiotics:  Zosyn August 10>>  Azithromycin August 10>>   Code Status: Full Family Communication: Pt at bedside Disposition Plan: Home when medically stable  Subjective: The patient states that she is breathing 50%  better. Still complains of mild cough. No hemoptysis. Still having some fevers, no riders. Denies any nausea, vomiting, diarrhea, abdominal pain, dysuria, hematuria, rashes.  Objective: Filed Vitals:   06/22/12 2324  06/23/12 0202 06/23/12 0558 06/23/12 1300  BP:  118/49 127/51 151/76  Pulse:  62 58 82  Temp: 101.9 F (38.8 C) 98.6 F (37 C) 98.9 F (37.2 C) 100.7 F (38.2 C)  TempSrc: Oral Oral Oral Oral  Resp:  22 20 19   Height:      Weight:   82.5 kg (181 lb 14.1 oz)   SpO2:  96% 97% 91%    Intake/Output Summary (Last 24 hours) at 06/23/12 1608 Last data filed at 06/23/12 1258  Gross per 24 hour  Intake    770 ml  Output    350 ml  Net    420 ml   Weight change:  Exam:   General:  Pt is alert, follows commands appropriately, not in acute distress  HEENT: No icterus, No thrush, No neck mass, [place]/AT, edentulous  Cardiovascular: Regular rate and rhythm, S1/S2, , no rubs, no gallops  Respiratory: Bibasilar crackles left greater than right, no wheezes  Abdomen: Soft, non tender, non distended, bowel sounds present, no guarding  Extremities: Trace edema left lower extremity, pulses DP and PT palpable bilaterally  Data Reviewed: Basic Metabolic Panel:  Lab 06/23/12 4098 06/22/12 2148 06/22/12 1226  NA 139 -- 136  K 3.8 -- 4.0  CL 104 -- 105  CO2 24 -- --  GLUCOSE 98 -- 105*  BUN 32* -- 25*  CREATININE 1.92* 1.54* 1.60*  CALCIUM 8.7 -- --  MG -- 1.9 --  PHOS -- -- --   Liver Function Tests: No results found for this basename: AST:5,ALT:5,ALKPHOS:5,BILITOT:5,PROT:5,ALBUMIN:5 in the last 168 hours No results found for this basename: LIPASE:5,AMYLASE:5 in the last 168 hours No results found for this basename: AMMONIA:5 in the last 168 hours CBC:  Lab 06/23/12 0318 06/22/12 2148 06/22/12 1226 06/22/12 1159 06/19/12 1214  WBC 8.8 10.5 -- 10.9* --  NEUTROABS 6.9 -- -- 8.7* --  HGB 9.0* 10.8* 12.6 11.1* 9.5*  HCT 27.8* 33.4* 37.0 34.2* --  MCV 71.1* 72.1* --  71.7* --  PLT 152 189 -- 217 --   Cardiac Enzymes:  Lab 06/23/12 0324 06/22/12 2147  CKTOTAL -- --  CKMB -- --  CKMBINDEX -- --  TROPONINI 0.77* 0.39*   BNP: No components found with this basename: POCBNP:5 CBG:  Lab 06/23/12 1059 06/23/12 0712 06/23/12 0635 06/23/12 0354 06/23/12 0002  GLUCAP 105* 95 65* 93 114*    No results found for this or any previous visit (from the past 240 hour(s)).   Scheduled Meds:   . allopurinol  200 mg Oral QPM  . amLODipine  10 mg Oral Daily  . aspirin EC  81 mg Oral Daily  . azithromycin  500 mg Oral Daily  . carvedilol  3.125 mg Oral BID WC  . citalopram  10 mg Oral Daily  . cloNIDine  0.1 mg Oral BID   And  . cloNIDine  0.3 mg Oral BID  . enoxaparin (LOVENOX) injection  30 mg Subcutaneous Q24H  . ezetimibe  10 mg Oral Daily  . famotidine  20 mg Oral Daily  . hydrALAZINE  75 mg Oral TID  . iron polysaccharides  150 mg Oral BID  . isosorbide dinitrate  40 mg Oral BID  . multivitamin  1 tablet Oral QHS  . piperacillin-tazobactam (ZOSYN)  IV  3.375 g Intravenous Q8H  . senna-docusate  2 tablet Oral QHS  . sodium chloride  3 mL Intravenous Q12H  . timolol  1 drop Both Eyes Daily  . DISCONTD:  citalopram  20 mg Oral Daily  . DISCONTD: cloNIDine  0.1 mg Oral Custom  . DISCONTD: cloNIDine  0.1 mg Oral Custom  . DISCONTD: cloNIDine  0.1 mg Oral Custom  . DISCONTD: cloNIDine  0.2 mg Oral QHS  . DISCONTD: cloNIDine  0.2 mg Oral QHS  . DISCONTD: cloNIDine  0.2 mg Oral BID  . DISCONTD: cloNIDine  0.3 mg Oral Custom  . DISCONTD: cloNIDine  0.3 mg Oral Daily  . DISCONTD: cloNIDine  0.6 mg Oral QHS  . DISCONTD: cloNIDine  0.6 mg Oral QHS  . DISCONTD: furosemide  20 mg Oral Daily  . DISCONTD: insulin aspart  0-8 Units Subcutaneous QHS  . DISCONTD: insulin aspart  0-9 Units Subcutaneous TID WC  . DISCONTD: lisinopril  2.5 mg Oral Daily  . DISCONTD: piperacillin-tazobactam (ZOSYN)  IV  2.25 g Intravenous Q6H  . DISCONTD: sennosides-docusate  sodium  2 tablet Oral Daily   Continuous Infusions:    Charlen Bakula, DO  [facility] Pager [phone]  If 7PM-7AM, please contact night-coverage [email or web address] Password [email or web address] 06/23/2012, 4:08 PM   LOS: 1 day

## 2012-06-24 ENCOUNTER — Inpatient Hospital Stay (HOSPITAL_COMMUNITY): Payer: Medicare Other

## 2012-06-24 DIAGNOSIS — N189 Chronic kidney disease, unspecified: Secondary | ICD-10-CM

## 2012-06-24 DIAGNOSIS — N179 Acute kidney failure, unspecified: Secondary | ICD-10-CM

## 2012-06-24 LAB — CBC
HCT: 26.7 % — ABNORMAL LOW (ref 36.0–46.0)
MCH: 23.7 pg — ABNORMAL LOW (ref 26.0–34.0)
MCHC: 33.3 g/dL (ref 30.0–36.0)
RDW: 16.8 % — ABNORMAL HIGH (ref 11.5–15.5)

## 2012-06-24 LAB — LEGIONELLA ANTIGEN, URINE: Legionella Antigen, Urine: NEGATIVE

## 2012-06-24 LAB — BASIC METABOLIC PANEL
BUN: 51 mg/dL — ABNORMAL HIGH (ref 6–23)
Creatinine, Ser: 2.73 mg/dL — ABNORMAL HIGH (ref 0.50–1.10)
GFR calc Af Amer: 17 mL/min — ABNORMAL LOW (ref >90)
GFR calc non Af Amer: 15 mL/min — ABNORMAL LOW (ref >90)

## 2012-06-24 LAB — GLUCOSE, CAPILLARY
Glucose-Capillary: 165 mg/dL — ABNORMAL HIGH (ref 70–99)
Glucose-Capillary: 136 mg/dL — ABNORMAL HIGH (ref 70–99)
Glucose-Capillary: 139 mg/dL — ABNORMAL HIGH (ref 70–99)

## 2012-06-24 LAB — MAGNESIUM: Magnesium: 2.1 mg/dL (ref 1.5–2.5)

## 2012-06-24 LAB — PHOSPHORUS: Phosphorus: 4.9 mg/dL — ABNORMAL HIGH (ref 2.3–4.6)

## 2012-06-24 MED ORDER — HYDRALAZINE HCL 25 MG PO TABS
75.0000 mg | ORAL_TABLET | Freq: Two times a day (BID) | ORAL | Status: DC
  Administered 2012-06-24 – 2012-06-28 (×9): 75 mg via ORAL
  Administered 2012-06-29: 10:00:00 via ORAL
  Administered 2012-06-29 – 2012-07-01 (×4): 75 mg via ORAL
  Filled 2012-06-24 (×17): qty 1

## 2012-06-24 MED ORDER — SODIUM CHLORIDE 0.9 % IV SOLN
INTRAVENOUS | Status: DC
Start: 2012-06-24 — End: 2012-06-25
  Administered 2012-06-24 (×2): via INTRAVENOUS

## 2012-06-24 NOTE — Progress Notes (Signed)
Patient taken off unit to radiology.

## 2012-06-24 NOTE — Progress Notes (Signed)
[facility] PROGRESS NOTE  [name] ZOX:[id number] DOB: 1927-02-22 DOA: 06/22/2012 PCP: Thora Lance, MD  Assessment/Plan: Acute on chronic renal failure -May certainly be due to sepsis/infectious process -will give the patient 500 cc normal saline -Check urine for eosinophils -Obtain renal ultrasound -Nephrology consult if no improvement Acute exacerbation of diastolic heart failure  -The patient had a Lexiscan on 06/08/2011 showing ejection fraction of 62%  -Overall clinically improved  -I.'s and O.'s questionable reliability, no Foley catheter  --Echocardiogram shows grade 2 diastolic dysfunction, ejection fraction 50-55% Pneumonia  -Continue Zosyn and azithromycin at this time pending culture data  -Patient continues to have fever -Repeat chest x-ray; blood cultures x2 for her next temperature greater than 100.57F Hypertension  -Will hold amlodipine at this time, decrease hydralazine to 75 mg twice a day Abnormal troponins  -Likely demand ischemia, CHF heart strain  Diabetes mellitus type 2  -Given her hemoglobin A1c and her age, discontinue Accu-Cheks  -Hemoglobin A1c 5.8  Hyperlipidemia  History of breast cancer  -Status post lumpectomy with radiation and tamoxifen  Peripheral vascular disease  -Status post right above-the-knee amputation  Coronary artery disease status post CABG    Procedures/Studies: Dg Chest 2 View  06/23/2012  *RADIOLOGY REPORT*  Clinical Data: CHF, COPD  CHEST - 2 VIEW  Comparison: 06/22/2012; 06/05/2011; 04/01/2007  Findings: Grossly unchanged cardiac silhouette and mediastinal contours post median sternotomy and CABG.  Pulmonary vasculature remains indistinct.  Grossly unchanged bibasilar heterogeneous opacities, left greater than right.  Small left-sided effusion is suspected.  No definite pneumothorax.  Grossly unchanged bones including moderate to severe degenerative changes of the thoracolumbar spine.  Extensive degenerative  change of the bilateral glenohumeral joints, right greater than left. Surgical clips overlie the right axilla.  IMPRESSION: Overall findings suggestive of pulmonary edema, though note, underlying infection is not excluded.  Original Report Authenticated By: Waynard Reeds, M.D.   Dg Chest 2 View  06/22/2012  *RADIOLOGY REPORT*  Clinical Data: Cough.  Fever.  CHEST - 2 VIEW  Comparison: Two-view chest x-ray 06/05/2011, 04/01/2007, 09/04/2006.  Findings: Prior sternotomy CABG.  Cardiac silhouette enlarged but stable.  Thoracic aorta atherosclerotic, unchanged.  Mildly prominent central pulmonary arteries, unchanged.  Interval development of mild diffuse interstitial pulmonary edema and small bilateral pleural effusions.  Severe degenerative changes involving the right shoulder.  Degenerative changes and DISH involving the thoracic spine.  IMPRESSION: Mild CHF, with stable cardiomegaly, mild diffuse interstitial pulmonary edema, and small bilateral pleural effusions.  Original Report Authenticated By: Arnell Sieving, M.D.   Dg Abd 2 Views  06/23/2012  *RADIOLOGY REPORT*  Clinical Data: Abdominal pain, COPD  ABDOMEN - 2 VIEW  Comparison: Chest radiograph - earlier same day; lumbar spine radiographs - 04/03/2006  Findings: There is mild gaseous distension of the colon without definite evidence of obstruction.  Moderate colonic stool burden. No definite pneumoperitoneum on the prior lateral decubitus radiograph.  No definite pneumatosis or portal venous gas. Extensive atherosclerotic calcifications within the abdominal aorta and iliac vasculature.  Limited visualization of the lower thorax suggests an enlarged cardiac silhouette with bibasilar opacities, possibly atelectasis.  Extensive multilevel thoracolumbar spine degenerative change.  IMPRESSION: Moderate colonic stool burden and gaseous distension of the colon most suggestive of ileus.  No definite evidence of obstruction.  Original Report Authenticated  By: Waynard Reeds, M.D.   Antibiotics:  Zosyn August 10>>  Azithromycin August 10>>   Code Status: Full Family Communication: Pt at bedside Disposition Plan: Home when  medically stable  Subjective: Patient is more tired today. She feels that breathing is 50% better. Denies any chest discomfort, nausea, vomiting, abdominal pain, headaches, rashes, visual changes. Nursing has noted decreased urine output.  Objective: Filed Vitals:   06/24/12 0529 06/24/12 1043 06/24/12 1455 06/24/12 1646  BP: 150/59 110/50 105/45 115/57  Pulse: 74 69 56 58  Temp: 99.2 F (37.3 C) 99.4 F (37.4 C) 97.7 F (36.5 C)   TempSrc: Axillary Axillary Oral   Resp: 18 16 18    Height:      Weight: 81.8 kg (180 lb 5.4 oz)     SpO2: 92% 94% 96%     Intake/Output Summary (Last 24 hours) at 06/24/12 1724 Last data filed at 06/24/12 1413  Gross per 24 hour  Intake   1113 ml  Output      2 ml  Net   1111 ml   Weight change: -0.8 kg (-1 lb 12.2 oz) Exam:   General:  Pt is alert, follows commands appropriately, not in acute distress  HEENT: No icterus, No thrush, No neck mass, [place]/AT  Cardiovascular: Regular rate and rhythm, S1/S2, , no rubs, no gallops  Respiratory: Bibasilar crackles left greater than right  Abdomen: Soft, non tender, non distended, bowel sounds present, no guarding  Extremities: Right above-the-knee amputation-site without any erythema or open wounds. Left lower extremity without any rashes or edema.  Data Reviewed: Basic Metabolic Panel:  Lab 06/24/12 0981 06/23/12 0318 06/22/12 2148 06/22/12 1226  NA 137 139 -- 136  K 3.6 3.8 -- 4.0  CL 102 104 -- 105  CO2 23 24 -- --  GLUCOSE 110* 98 -- 105*  BUN 51* 32* -- 25*  CREATININE 2.73* 1.92* 1.54* 1.60*  CALCIUM 8.5 8.7 -- --  MG 2.1 -- 1.9 --  PHOS 4.9* -- -- --   Liver Function Tests: No results found for this basename: AST:5,ALT:5,ALKPHOS:5,BILITOT:5,PROT:5,ALBUMIN:5 in the last 168 hours No results found for this  basename: LIPASE:5,AMYLASE:5 in the last 168 hours No results found for this basename: AMMONIA:5 in the last 168 hours CBC:  Lab 06/24/12 0700 06/23/12 0318 06/22/12 2148 06/22/12 1226 06/22/12 1159  WBC 7.2 8.8 10.5 -- 10.9*  NEUTROABS -- 6.9 -- -- 8.7*  HGB 8.9* 9.0* 10.8* 12.6 11.1*  HCT 26.7* 27.8* 33.4* 37.0 34.2*  MCV 71.2* 71.1* 72.1* -- 71.7*  PLT 199 152 189 -- 217   Cardiac Enzymes:  Lab 06/23/12 0324 06/22/12 2147  CKTOTAL -- --  CKMB -- --  CKMBINDEX -- --  TROPONINI 0.77* 0.39*   BNP: No components found with this basename: POCBNP:5 CBG:  Lab 06/24/12 1605 06/24/12 1109 06/24/12 0705 06/24/12 0640 06/23/12 2058  GLUCAP 136* 165* 102* 49* 102*    Recent Results (from the past 240 hour(s))  URINE CULTURE     Status: Normal   Collection Time   06/22/12  2:29 PM      Component Value Range Status Comment   Specimen Description URINE, CATHETERIZED   Final    Special Requests NONE   Final    Culture  Setup Time 06/22/2012 22:56   Final    Colony Count NO GROWTH   Final    Culture NO GROWTH   Final    Report Status 06/23/2012 FINAL   Final   CULTURE, BLOOD (ROUTINE X 2)     Status: Normal (Preliminary result)   Collection Time   06/22/12  9:45 PM      Component Value Range Status  Comment   Specimen Description BLOOD LEFT HAND   Final    Special Requests     Final    Value: BOTTLES DRAWN AEROBIC AND ANAEROBIC 10CC BLUE 5CC RED   Culture  Setup Time 06/23/2012 02:12   Final    Culture     Final    Value:        BLOOD CULTURE RECEIVED NO GROWTH TO DATE CULTURE WILL BE HELD FOR 5 DAYS BEFORE ISSUING A FINAL NEGATIVE REPORT   Report Status PENDING   Incomplete   CULTURE, BLOOD (ROUTINE X 2)     Status: Normal (Preliminary result)   Collection Time   06/22/12 10:00 PM      Component Value Range Status Comment   Specimen Description BLOOD LEFT ARM   Final    Special Requests     Final    Value: BOTTLES DRAWN AEROBIC AND ANAEROBIC 10CC BLUE 5CC RED   Culture  Setup  Time 06/23/2012 02:12   Final    Culture     Final    Value:        BLOOD CULTURE RECEIVED NO GROWTH TO DATE CULTURE WILL BE HELD FOR 5 DAYS BEFORE ISSUING A FINAL NEGATIVE REPORT   Report Status PENDING   Incomplete      Scheduled Meds:   . allopurinol  200 mg Oral QPM  . aspirin EC  81 mg Oral Daily  . azithromycin  500 mg Oral Daily  . carvedilol  3.125 mg Oral BID WC  . citalopram  10 mg Oral Daily  . cloNIDine  0.3 mg Oral BID  . enoxaparin (LOVENOX) injection  30 mg Subcutaneous Q24H  . ezetimibe  10 mg Oral Daily  . famotidine  20 mg Oral Daily  . hydrALAZINE  75 mg Oral BID  . iron polysaccharides  150 mg Oral BID  . isosorbide dinitrate  40 mg Oral BID  . multivitamin  1 tablet Oral QHS  . piperacillin-tazobactam (ZOSYN)  IV  3.375 g Intravenous Q8H  . senna-docusate  2 tablet Oral QHS  . sodium chloride  3 mL Intravenous Q12H  . timolol  1 drop Both Eyes Daily  . DISCONTD: amLODipine  10 mg Oral Daily  . DISCONTD: hydrALAZINE  75 mg Oral TID   Continuous Infusions:   . sodium chloride       Nakoma Gotwalt, DO  [facility] Pager [phone]  If 7PM-7AM, please contact night-coverage [email or web address] Password [email or web address] 06/24/2012, 5:24 PM   LOS: 2 days

## 2012-06-24 NOTE — Progress Notes (Signed)
Bladder scan pt , no urine output noted on scan.  Pt stated she voided x1 at llam this am.  In coming nurse made aware.  Amanda Pea, Charity fundraiser.

## 2012-06-24 NOTE — Progress Notes (Signed)
Patient has not voided in approximately 10 hours. Patient has been bladder scanned which showed <150cc.  [name] notified of situation, per Mary-continue to monitor patient but wait on I&O catheter for now.  Will continue to monitor.  Troy Sine

## 2012-06-24 NOTE — Progress Notes (Signed)
Patient returned to unit from radiology.

## 2012-06-24 NOTE — Progress Notes (Signed)
Hypoglycemic Event  CBG:49 Treatment: juice/resource Symptoms:none Follow-up CBG: Time: CBG Result: Possible Reasons for Event: illness Comments/MD notified:    Troy Sine  Remember to initiate Hypoglycemia Order Set & complete

## 2012-06-24 NOTE — Progress Notes (Signed)
Utilization Review Completed.  

## 2012-06-25 ENCOUNTER — Encounter (HOSPITAL_COMMUNITY): Payer: Self-pay | Admitting: Internal Medicine

## 2012-06-25 ENCOUNTER — Inpatient Hospital Stay (HOSPITAL_COMMUNITY): Payer: Medicare Other

## 2012-06-25 DIAGNOSIS — J449 Chronic obstructive pulmonary disease, unspecified: Secondary | ICD-10-CM

## 2012-06-25 LAB — URINALYSIS, MICROSCOPIC ONLY
Hgb urine dipstick: NEGATIVE
Protein, ur: NEGATIVE mg/dL
Urobilinogen, UA: 0.2 mg/dL (ref 0.0–1.0)

## 2012-06-25 LAB — CBC WITH DIFFERENTIAL/PLATELET
Eosinophils Absolute: 0.3 10*3/uL (ref 0.0–0.7)
Eosinophils Relative: 6 % — ABNORMAL HIGH (ref 0–5)
Lymphs Abs: 0.8 10*3/uL (ref 0.7–4.0)
MCH: 23.2 pg — ABNORMAL LOW (ref 26.0–34.0)
MCV: 70.3 fL — ABNORMAL LOW (ref 78.0–100.0)
Monocytes Relative: 10 % (ref 3–12)
Platelets: 170 10*3/uL (ref 150–400)
RBC: 3.54 MIL/uL — ABNORMAL LOW (ref 3.87–5.11)

## 2012-06-25 LAB — GLUCOSE, CAPILLARY: Glucose-Capillary: 145 mg/dL — ABNORMAL HIGH (ref 70–99)

## 2012-06-25 LAB — BASIC METABOLIC PANEL
Calcium: 7.5 mg/dL — ABNORMAL LOW (ref 8.4–10.5)
GFR calc Af Amer: 13 mL/min — ABNORMAL LOW (ref >90)
GFR calc non Af Amer: 11 mL/min — ABNORMAL LOW (ref >90)
Glucose, Bld: 109 mg/dL — ABNORMAL HIGH (ref 70–99)
Sodium: 136 mEq/L (ref 135–145)

## 2012-06-25 MED ORDER — IPRATROPIUM BROMIDE 0.02 % IN SOLN
0.5000 mg | Freq: Once | RESPIRATORY_TRACT | Status: DC
  Filled 2012-06-25: qty 2.5

## 2012-06-25 MED ORDER — PIPERACILLIN-TAZOBACTAM IN DEX 2-0.25 GM/50ML IV SOLN
2.2500 g | Freq: Four times a day (QID) | INTRAVENOUS | Status: DC
  Filled 2012-06-25 (×2): qty 50

## 2012-06-25 MED ORDER — ALLOPURINOL 100 MG PO TABS
100.0000 mg | ORAL_TABLET | Freq: Every evening | ORAL | Status: DC
  Administered 2012-06-25 – 2012-06-30 (×6): 100 mg via ORAL
  Filled 2012-06-25 (×9): qty 1

## 2012-06-25 MED ORDER — CLONIDINE HCL 0.2 MG PO TABS
0.2000 mg | ORAL_TABLET | Freq: Two times a day (BID) | ORAL | Status: DC
  Administered 2012-06-25 – 2012-07-01 (×12): 0.2 mg via ORAL
  Filled 2012-06-25 (×14): qty 1

## 2012-06-25 MED ORDER — ALBUTEROL SULFATE (5 MG/ML) 0.5% IN NEBU
2.5000 mg | INHALATION_SOLUTION | Freq: Once | RESPIRATORY_TRACT | Status: DC
  Filled 2012-06-25: qty 0.5

## 2012-06-25 MED ORDER — LEVOFLOXACIN 750 MG PO TABS
750.0000 mg | ORAL_TABLET | ORAL | Status: DC
  Administered 2012-06-25: 750 mg via ORAL
  Filled 2012-06-25 (×2): qty 1

## 2012-06-25 MED ORDER — VANCOMYCIN 50 MG/ML ORAL SOLUTION
125.0000 mg | Freq: Four times a day (QID) | ORAL | Status: DC
  Administered 2012-06-25: 125 mg via ORAL
  Filled 2012-06-25 (×5): qty 2.5

## 2012-06-25 NOTE — Plan of Care (Signed)
Problem: Phase I Progression Outcomes Goal: EF % per last Echo/documented,Core Reminder form on chart Outcome: Completed/Met Date Met:  06/25/12 06/19/12 Echo done. EF of 50-55%

## 2012-06-25 NOTE — Progress Notes (Addendum)
Pt Bladder scan 0 this am.  Will cont. To monitor.  Cahterine Heinzel,RN

## 2012-06-25 NOTE — Evaluation (Signed)
Physical Therapy Evaluation Patient Details Name: ALAYIAH FONTES MRN: [id number] DOB: May 24, 1927 Today's Date: 06/25/2012 Time: 2130-8657 PT Time Calculation (min): 32 min  PT Assessment / Plan / Recommendation Clinical Impression  Patient s/p SOB.  Has diarrhea limiting treatment at present.  Will benefit from PT to address mobility.      PT Assessment  Patient needs continued PT services    Follow Up Recommendations  Home health PT;Supervision/Assistance - 24 hour    Barriers to Discharge        Equipment Recommendations  None recommended by PT    Recommendations for Other Services     Frequency Min 3X/week    Precautions / Restrictions Precautions Precautions: Fall Restrictions Weight Bearing Restrictions: No   Pertinent Vitals/Pain VSS, No pain      Mobility  Bed Mobility Bed Mobility: Rolling Right;Right Sidelying to Sit;Sitting - Scoot to [place] of Bed Rolling Right: 4: Min assist Right Sidelying to Sit: 4: Min assist;With rails;HOB elevated Sitting - Scoot to [place] of Bed:  (Assisted Nellie, nurse to clean pt of BM with total assist) Details for Bed Mobility Assistance: Patient with loose stools.  Only performed bed mobility and sat EEOB due to this.  Patient needs cues for technique. Transfers Transfers: Not assessed Ambulation/Gait Ambulation/Gait Assistance: Not tested (comment) Stairs: No Wheelchair Mobility Wheelchair Mobility: No         PT Diagnosis: Generalized weakness  PT Problem List: Decreased activity tolerance;Decreased safety awareness;Decreased knowledge of use of DME;Decreased balance;Decreased knowledge of precautions;Decreased mobility PT Treatment Interventions: DME instruction;Gait training;Functional mobility training;Therapeutic activities;Therapeutic exercise;Balance training;Patient/family education   PT Goals Acute Rehab PT Goals PT Goal Formulation: With patient Time For Goal Achievement: 07/02/12 Potential to Achieve Goals:  Good Pt will go Supine/Side to Sit: with modified independence;with rail PT Goal: Supine/Side to Sit - Progress: Goal set today Pt will Sit at Edge of Bed: Independently;3-5 min;with no upper extremity support PT Goal: Sit at Edge Of Bed - Progress: Goal set today Pt will go Sit to Stand: with supervision;with upper extremity assist PT Goal: Sit to Stand - Progress: Goal set today Pt will Transfer Bed to Chair/Chair to Bed: with min assist PT Transfer Goal: Bed to Chair/Chair to Bed - Progress: Goal set today Pt will Ambulate: 1 - 15 feet;with min assist;with least restrictive assistive device PT Goal: Ambulate - Progress: Goal set today  Visit Information  Last PT Received On: 06/25/12 Assistance Needed: +2    Subjective Data  Subjective: "I use my prosthesis but it needs some work." Patient Stated Goal: To go home   Prior Functioning  Home Living Lives With: Family Available Help at Discharge: Personal care attendant (Aide 9-4 per patient and grandchildren the rest of time) Type of Home: House Home Access: Ramped entrance Home Layout: One level Bathroom Shower/Tub: Engineer, manufacturing systems: Standard Home Adaptive Equipment: Bedside commode/3-in-1;Tub transfer bench;Walker - rolling;Wheelchair - powered;Wheelchair - manual;Hand-held shower hose Prior Function Level of Independence: Needs assistance Needs Assistance: Bathing;Dressing;Feeding;Grooming;Toileting;Gait;Transfers Bath: Maximal Dressing: Moderate Feeding: Supervision/set-up Grooming: Supervision/set-up Toileting: Total Gait Assistance: Short distance with RW and prosthesis and min assist per pt. Transfer Assistance: Min assist with RW stand pivot Able to Take Stairs?: No Driving: No Vocation: Retired Musician: No difficulties    Cognition  Overall Cognitive Status: Appears within functional limits for tasks assessed/performed Arousal/Alertness: Awake/alert Orientation Level:  Appears intact for tasks assessed Behavior During Session: [facility] for tasks performed    Extremity/Trunk Assessment Right Upper Extremity Assessment RUE  ROM/Strength/Tone: [facility] for tasks assessed RUE Sensation: WFL - Light Touch RUE Coordination: WFL - gross/fine motor Left Upper Extremity Assessment LUE ROM/Strength/Tone: WFL for tasks assessed LUE Sensation: WFL - Light Touch LUE Coordination: WFL - gross/fine motor Right Lower Extremity Assessment RLE ROM/Strength/Tone: Deficits RLE ROM/Strength/Tone Deficits: Right AKA RLE Sensation: WFL - Light Touch Left Lower Extremity Assessment LLE ROM/Strength/Tone: WFL for tasks assessed LLE Sensation: WFL - Light Touch LLE Coordination: WFL - gross/fine motor   Balance Static Sitting Balance Static Sitting - Balance Support: No upper extremity supported;Feet supported Static Sitting - Level of Assistance: 6: Modified independent (Device/Increase time) Static Sitting - Comment/# of Minutes: 2 minutes  End of Session PT - End of Session Activity Tolerance: Patient tolerated treatment well Patient left: in bed;with call bell/phone within reach;with nursing in room Nurse Communication: Mobility status       INGOLD,Azyria Osmon 06/25/2012, 3:35 PM  [facility] Acute Rehabilitation [phone] (pager)

## 2012-06-25 NOTE — Progress Notes (Signed)
DR. D . Tat informed that pt has had 4-5 loose black stool.   Also notified him that pt bladder scan this am and no urine noted.  MD instructed to send stool for C. Diff.  Will cont. To monitor.  Amanda Pea, Charity fundraiser.

## 2012-06-25 NOTE — Consult Note (Addendum)
Nephrology Service Initial Consult Note  Reason for Consult: Acute on Chronic Kidney Disease Referring Physician: Dr. Arbutus Leas  HPI: [name] is an 78 y.o. African American woman, history of CKD stage 3 (baseline Cr = 1.3-1.5), CAD, CHF (EF 50-55%, grade 2 diastolic dysfunction), CAD, COPD, HL, PVD, TIA breast cancer s/p surgery/chemo/rad, presenting 8/10 with productive cough, SOB, and weakness, found to have pneumonia and CHF exacerbation, consulted 8/13 for acute on chronic kidney disease.  The patient was admitted 8/10, and was started on zosyn/azithromycin, which were discontinued and changed to levaquin on 8/13.  The patient is on home lasix 20 mg/day (with intermittent compliance), and has been given lasix 80 IV on 8/10, followed by 20 mg PO daily since then.  She also received Lisinopril 2.5 mg on 8/11, once.  The patient's creatinine has been rising steadily since admission, with the greatest rise occurring yesterday.  The patient received NS 750 cc's last night.  She has received no IV contrast since admission.  I's/O's have not been strictly recorded, but the patient reports urinating once today, and about once yesterday (decreased from her home urination, which is about three times/day).  The patient also notes diarrhea, with 4 loose stools today.  The patient has a history of CKD stage 3, followed by Dr. Hyman Hopes since 2006, thought secondary to chronic NSAID and ACE-I usage, as well as a >20 yr history of HTN and DM.  Her creatinine has been stable for several years.  She is currently on procrit.   PMH:   Past Medical History  Diagnosis Date  . DM (diabetes mellitus) type II controlled peripheral vascular disorder     A1C = 5.8  . HTN (hypertension)   . CHF (congestive heart failure)     EF = 50-55%, grade 2 diastolic dysfunction  . CKD (chronic kidney disease)   . CAD (coronary artery disease)   . COPD (chronic obstructive pulmonary disease)   . Cataracts, bilateral   .  Hyperlipidemia   . Peripheral vascular disease bilateral    S/P femoral-popliteal bypass surgery  . Glaucoma   . TIA (transient ischemic attack)     PSH:   Past Surgical History  Procedure Date  . Coronary artery bypass graft   . Spine surgery   . Eye surgery cataract  . Carpal tunnel release left  . Breast surgery lumpectomy right  . Aortogram w/ runoff 06/06/11    right leg  . Above knee leg amputation 07/27/11    Right AKA    Allergies:  Allergies  Allergen Reactions  . Codeine   . Omnipaque (Iohexol)   . Celebrex (Celecoxib) Rash  . Iodine Rash    Medications:   Prescriptions prior to admission  Medication Sig Dispense Refill  . allopurinol (ZYLOPRIM) 100 MG tablet Take 200 mg by mouth every evening.       Marland Kitchen amLODipine (NORVASC) 5 MG tablet Take 10 mg by mouth daily.       Marland Kitchen aspirin EC 81 MG tablet Take 81 mg by mouth daily.      Marland Kitchen b complex-vitamin c-folic acid (NEPHRO-VITE) 0.8 MG TABS Take 0.8 mg by mouth at bedtime.        . citalopram (CELEXA) 10 MG tablet Take 20 mg by mouth daily.       . cloNIDine (CATAPRES) 0.3 MG tablet Take 0.3-0.6 mg by mouth 3 (three) times daily. Take 0.3 MG in the morning, take 0.3 MG in the afternoon, and take 0.6  MG at night.      . clotrimazole (LOTRIMIN) 1 % external solution Apply 1 application topically as needed. Apply to fungal rash on foot      . colchicine 0.6 MG tablet Take 0.6 mg by mouth 2 (two) times daily as needed. Once or twice a day as needed for acute gout flare      . ezetimibe (ZETIA) 10 MG tablet Take 10 mg by mouth daily.        . famotidine (PEPCID) 20 MG tablet Take 20 mg by mouth daily.       . Filgrastim (NEUPOGEN IJ) Inject as directed 2 (two) times a week. Twice a month.      . furosemide (LASIX) 40 MG tablet Take 20 mg by mouth daily.       Marland Kitchen glimepiride (AMARYL) 2 MG tablet Take 2 mg by mouth daily before breakfast.        . glucosamine-chondroitin 500-400 MG tablet Take 3 tablets by mouth 3 (three) times  daily.       . hydrALAZINE (APRESOLINE) 50 MG tablet Take 75 mg by mouth 3 (three) times daily.       Marland Kitchen HYDROcodone-acetaminophen (NORCO) 5-325 MG per tablet Take 1-2 tablets by mouth every 6 (six) hours as needed. For pain      . isosorbide dinitrate (ISORDIL) 40 MG tablet Take 40 mg by mouth 2 (two) times daily.       Marland Kitchen loratadine (CLARITIN) 10 MG tablet Take 10 mg by mouth daily as needed. For allergies.      . mupirocin (BACTROBAN) 2 % ointment Apply 1 application topically 2 (two) times daily as needed. For skin breakouts.      Marland Kitchen oxycodone (OXY-IR) 5 MG capsule Take 5 mg by mouth every 4 (four) hours as needed. For pain.      Marland Kitchen oxyCODONE-acetaminophen (PERCOCET) 7.5-325 MG per tablet Take 1 tablet by mouth every 6 (six) hours as needed. For pain.      . polyethylene glycol (MIRALAX / GLYCOLAX) packet Take 17 g by mouth daily as needed. For constipation.      . Polysaccharide Iron Complex (FERREX 150 PO) Take 1 tablet by mouth 2 (two) times daily.       . sennosides-docusate sodium (SENOKOT-S) 8.6-50 MG tablet Take 2 tablets by mouth daily.       . timolol (TIMOPTIC-XR) 0.25 % ophthalmic gel-forming Place 1 drop into both eyes daily.       Marland Kitchen triamcinolone (KENALOG) 0.1 % cream Apply 1 application topically 2 (two) times daily as needed. For skin breakouts.       Scheduled Meds:   . allopurinol  100 mg Oral QPM  . aspirin EC  81 mg Oral Daily  . carvedilol  3.125 mg Oral BID WC  . citalopram  10 mg Oral Daily  . cloNIDine  0.2 mg Oral BID  . enoxaparin (LOVENOX) injection  30 mg Subcutaneous Q24H  . ezetimibe  10 mg Oral Daily  . famotidine  20 mg Oral Daily  . hydrALAZINE  75 mg Oral BID  . iron polysaccharides  150 mg Oral BID  . isosorbide dinitrate  40 mg Oral BID  . levofloxacin  750 mg Oral Q48H  . multivitamin  1 tablet Oral QHS  . senna-docusate  2 tablet Oral QHS  . sodium chloride  3 mL Intravenous Q12H  . timolol  1 drop Both Eyes Daily  . DISCONTD: allopurinol  200 mg  Oral QPM  .  DISCONTD: amLODipine  10 mg Oral Daily  . DISCONTD: azithromycin  500 mg Oral Daily  . DISCONTD: cloNIDine  0.3 mg Oral BID  . DISCONTD: hydrALAZINE  75 mg Oral TID  . DISCONTD: piperacillin-tazobactam (ZOSYN)  IV  2.25 g Intravenous Q6H  . DISCONTD: piperacillin-tazobactam (ZOSYN)  IV  3.375 g Intravenous Q8H   Continuous Infusions:   . sodium chloride 75 mL/hr at 06/24/12 05/02/2157   PRN Meds:.sodium chloride, acetaminophen, HYDROcodone-acetaminophen, loratadine, ondansetron (ZOFRAN) IV, polyethylene glycol, sodium chloride   Family History:   Family History  Problem Relation Age of Onset  . Hypertension Mother   . Peripheral vascular disease Mother   . Diabetes Father   . Kidney disease Sister   . Cancer Brother     lung  . Lung disease Sister   . Lung disease Sister   . Kidney disease Daughter     On dialysis, died in 2006/05/02    Social History:  reports that she quit smoking about 35 years ago. Her smoking use included Cigarettes. She has a 40 pack-year smoking history. She has never used smokeless tobacco. She reports that she does not drink alcohol or use illicit drugs. 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 rate 18, height 5' (1.524 m), weight 83.779 kg (184 lb 11.2 oz), SpO2 95.00%.  PEX General: alert, cooperative, and in no apparent distress HEENT: pupils equal round and  reactive to light, vision grossly intact, oropharynx clear and non-erythematous  Neck: supple, no lymphadenopathy Lungs: mildly increased work of respiration, mild course breath sounds heard bilaterally Heart: regular rate and rhythm, no murmurs, gallops, or rubs Abdomen: soft, non-tender, non-distended, normal bowel sounds Extremities: no cyanosis, clubbing, or edema Neurologic: alert & oriented X3, cranial nerves II-XII intact, strength grossly intact, sensation intact to light touch, Right handed  Creatinine, Ser  Date/Time Value Range Status  06/25/2012  6:35 AM 3.41* 0.50 - 1.10 mg/dL Final  5/62/1308  6:57 AM 2.73* 0.50 - 1.10 mg/dL Final  8/46/9629  5:28 AM 1.92* 0.50 - 1.10 mg/dL Final  02/24/2439  1:02 PM 1.54* 0.50 - 1.10 mg/dL Final  06/07/3663 40:34 PM 1.60* 0.50 - 1.10 mg/dL Final  7/42/5956  3:87 AM 1.43* 0.50 - 1.10 mg/dL Final  5/64/3329  5:18 AM 1.34* 0.50 - 1.10 mg/dL Final  8/41/6606  3:01 AM 1.69* 0.50 - 1.10 mg/dL Final  04/14/931  3:55 AM 1.51* 0.50 - 1.10 mg/dL Final  7/32/2025  4:27 AM 1.59* 0.50 - 1.10 mg/dL Final  0/04/2375  2:83 AM 1.48* 0.50 - 1.10 mg/dL Final  1/51/7616  0:73 AM 1.50* 0.50 - 1.10 mg/dL Final  05/22/6268  4:85 AM 1.48* 0.50 - 1.10 mg/dL Final  4/62/7035  0:09 PM 1.30* 0.50 - 1.10 mg/dL Final  01/19/1828  9:37 PM 1.65* 0.4 - 1.2 mg/dL Final    Results for orders placed during the hospital encounter of 06/22/12 (from the past 48 hour(s))  GLUCOSE, CAPILLARY     Status: Abnormal  Collection Time   06/23/12  4:35 PM      Component Value Range Comment   Glucose-Capillary 133 (*) 70 - 99 mg/dL    Comment 1 Notify RN      Comment 2 Documented in Chart     GLUCOSE, CAPILLARY     Status: Abnormal   Collection Time   06/23/12  8:58 PM      Component Value Range Comment   Glucose-Capillary 102 (*) 70 - 99 mg/dL    Comment 1 Notify RN     GLUCOSE, CAPILLARY     Status: Abnormal   Collection Time   06/24/12  6:40 AM      Component Value Range Comment    Glucose-Capillary 49 (*) 70 - 99 mg/dL    Comment 1 Notify RN     BASIC METABOLIC PANEL     Status: Abnormal   Collection Time   06/24/12  7:00 AM      Component Value Range Comment   Sodium 137  135 - 145 mEq/L    Potassium 3.6  3.5 - 5.1 mEq/L    Chloride 102  96 - 112 mEq/L    CO2 23  19 - 32 mEq/L    Glucose, Bld 110 (*) 70 - 99 mg/dL    BUN 51 (*) 6 - 23 mg/dL    Creatinine, Ser 2.13 (*) 0.50 - 1.10 mg/dL    Calcium 8.5  8.4 - 08.6 mg/dL    GFR calc non Af Amer 15 (*) >90 mL/min    GFR calc Af Amer 17 (*) >90 mL/min   CBC     Status: Abnormal   Collection Time   06/24/12  7:00 AM      Component Value Range Comment   WBC 7.2  4.0 - 10.5 K/uL    RBC 3.75 (*) 3.87 - 5.11 MIL/uL    Hemoglobin 8.9 (*) 12.0 - 15.0 g/dL    HCT 57.8 (*) 46.9 - 46.0 %    MCV 71.2 (*) 78.0 - 100.0 fL    MCH 23.7 (*) 26.0 - 34.0 pg    MCHC 33.3  30.0 - 36.0 g/dL    RDW 62.9 (*) 52.8 - 15.5 %    Platelets 199  150 - 400 K/uL   MAGNESIUM     Status: Normal   Collection Time   06/24/12  7:00 AM      Component Value Range Comment   Magnesium 2.1  1.5 - 2.5 mg/dL   PHOSPHORUS     Status: Abnormal   Collection Time   06/24/12  7:00 AM      Component Value Range Comment   Phosphorus 4.9 (*) 2.3 - 4.6 mg/dL   GLUCOSE, CAPILLARY     Status: Abnormal   Collection Time   06/24/12  7:05 AM      Component Value Range Comment   Glucose-Capillary 102 (*) 70 - 99 mg/dL    Comment 1 Notify RN     GLUCOSE, CAPILLARY     Status: Abnormal   Collection Time   06/24/12 11:09 AM      Component Value Range Comment   Glucose-Capillary 165 (*) 70 - 99 mg/dL    Comment 1 Notify RN     GLUCOSE, CAPILLARY     Status: Abnormal   Collection Time   06/24/12  4:05 PM      Component Value Range Comment   Glucose-Capillary 136 (*) 70 - 99 mg/dL    Comment  1 Notify RN     GLUCOSE, CAPILLARY     Status: Abnormal   Collection Time   06/24/12 10:11 PM      Component Value Range Comment   Glucose-Capillary 139 (*) 70 - 99  mg/dL    Comment 1 Notify RN     GLUCOSE, CAPILLARY     Status: Normal   Collection Time   06/25/12  6:10 AM      Component Value Range Comment   Glucose-Capillary 98  70 - 99 mg/dL    Comment 1 Documented in Chart      Comment 2 Notify RN     BASIC METABOLIC PANEL     Status: Abnormal   Collection Time   06/25/12  6:35 AM      Component Value Range Comment   Sodium 136  135 - 145 mEq/L    Potassium 3.5  3.5 - 5.1 mEq/L    Chloride 97  96 - 112 mEq/L    CO2 18 (*) 19 - 32 mEq/L    Glucose, Bld 109 (*) 70 - 99 mg/dL    BUN 68 (*) 6 - 23 mg/dL    Creatinine, Ser 1.61 (*) 0.50 - 1.10 mg/dL    Calcium 7.5 (*) 8.4 - 10.5 mg/dL    GFR calc non Af Amer 11 (*) >90 mL/min    GFR calc Af Amer 13 (*) >90 mL/min   CBC WITH DIFFERENTIAL     Status: Abnormal   Collection Time   06/25/12  6:35 AM      Component Value Range Comment   WBC 5.9  4.0 - 10.5 K/uL    RBC 3.54 (*) 3.87 - 5.11 MIL/uL    Hemoglobin 8.2 (*) 12.0 - 15.0 g/dL    HCT 09.6 (*) 04.5 - 46.0 %    MCV 70.3 (*) 78.0 - 100.0 fL    MCH 23.2 (*) 26.0 - 34.0 pg    MCHC 32.9  30.0 - 36.0 g/dL    RDW 40.9 (*) 81.1 - 15.5 %    Platelets 170  150 - 400 K/uL    Neutrophils Relative 71  43 - 77 %    Neutro Abs 4.2  1.7 - 7.7 K/uL    Lymphocytes Relative 14  12 - 46 %    Lymphs Abs 0.8  0.7 - 4.0 K/uL    Monocytes Relative 10  3 - 12 %    Monocytes Absolute 0.6  0.1 - 1.0 K/uL    Eosinophils Relative 6 (*) 0 - 5 %    Eosinophils Absolute 0.3  0.0 - 0.7 K/uL    Basophils Relative 1  0 - 1 %    Basophils Absolute 0.0  0.0 - 0.1 K/uL   GLUCOSE, CAPILLARY     Status: Abnormal   Collection Time   06/25/12 11:40 AM      Component Value Range Comment   Glucose-Capillary 145 (*) 70 - 99 mg/dL    Comment 1 Notify RN       Dg Chest 1 View  06/24/2012  *RADIOLOGY REPORT*  Clinical Data: Fever.  Question pneumonia.  CHEST - 1 VIEW  Comparison: PA and lateral chest with 06/23/2012 and 06/22/2012.  Findings: There is cardiomegaly and  pulmonary edema.  Somewhat more focal airspace disease is seen in the lung bases, greater on the left. The appearance is not markedly changed.  No pneumothorax. Severe degenerative disease about the right shoulder is noted.  IMPRESSION: Pulmonary edema  with somewhat more focal opacities in the bases, worse on the left, which could be due to atelectasis, more intense edema or pneumonia.  Original Report Authenticated By: Bernadene Bell. Maricela Curet, M.D.   US Renal  06/25/2012  *RADIOLOGY REPORT*  Clinical Data: Acute renal failure  RENAL/URINARY TRACT ULTRASOUND COMPLETE  Comparison:  None.  Findings:  Right Kidney:    10.5 cm in length.  Negative for obstruction or mass.  Left Kidney:  10.5 cm in length.  Negative for obstruction or mass. 2 cm left upper pole cyst appears benign and simple.  Bladder:  Normal  IMPRESSION: Normal renal size.  No obstruction.  Original Report Authenticated By: Camelia Phenes, M.D.    Assessment/Plan:  # Acute on Chronic Kidney Disease - the patient has a history of CKD stage 3 (baseline Cr = 1.3-1.5).  The patient's acute rise in creatinine likely represents AIN (mildly elevated eosinophils on differential, though no rash) vs ATN (BP drom from 8/10 to 8/11 from 194/80 to 118/49) vs prerenal azotemia (patient reports diarrhea with 4 loose stools).  No evidence of obstruction on renal US, unlikely RTA (no non-gap acidosis, only AG acidosis likely secondary to mild uremia).  Bicarb is currently low, phosphorus is 4.9. -appreciate excellent management by primary team -pending urine for eosinophils -strict I's/O's -agree with stopping zosyn/azithromycin -discontinue lasix  # Hypertension - patient initially presented with elevated blood pressures, which have now stabilized -continue clonidine, coreg, hydralazine  # Anemia - patient presents with chronic anemia, likely due to anemia of chronic disease, possibly with a component of iron deficiency anemia -check anemia  panel  Signed, Janalyn Harder 06/25/2012, 1:58 PM   [name] has been seen in our office by Dr. Hyman Hopes.  Cr was 1.3 in April.  She comes in with Cr 1.6 on admission and it has risen to 3.4 now.  She had SOB on admission, but also had fever (T102.3) on admission, new infiltrate, purulent sputum, and WBC 10, 900 on admission.  She has been treated for both CHF and  Pneumonia.  I agree with looking for urine eos and d/c of zosyn (on levaquin now) on the chance this could be acute tubulo-interstitial disease.  Renal US shows no hydronephrosis.  Will check SPEP and UPEP to be sure she does not have myeloma.  Check Fe studies to see whether she needs any IV Fe.

## 2012-06-25 NOTE — Progress Notes (Signed)
[facility] PROGRESS NOTE  RIKA DAUGHDRILL ZOX:[id number] DOB: 1926-12-31 DOA: 06/22/2012 PCP: Thora Lance, MD  Assessment/Plan: Acute on chronic renal failure  -Check urine for eosinophils  -Obtain renal ultrasound  -Creatinine worsened after 750 cc fluid challenge -Baseline creatinine 1.5-1.9 -Discontinued Zosyn. Started on Levaquin today. -Nephrology consulted 8/13 Acute exacerbation of diastolic heart failure  -The patient had a Lexiscan on 06/08/2011 showing ejection fraction of 62%  -Chest x-ray continues to show pulmonary edema -I.'s and O.'s unreliable, no Foley catheter  --Echocardiogram shows grade 2 diastolic dysfunction, ejection fraction 50-55%  Pneumonia  -Discontinued Zosyn due to concerns for interstitial nephritis -Started on Levaquin 750 every 48 hours -White blood cell count improved, afebrile last 24 hours Hypertension  -Discontinued amlodipine and decreased clonidine to 0.2 mg twice a day Abnormal troponins  -Likely demand ischemia, CHF heart strain  Anemia of chronic disease -Baseline hemoglobin ranges from 8.5-9.5 Diabetes mellitus type 2  -Given her hemoglobin A1c and her age, discontinue Accu-Cheks  -Hemoglobin A1c 5.8  Hyperlipidemia  History of breast cancer  -Status post lumpectomy with radiation and tamoxifen  Peripheral vascular disease  -Status post right above-the-knee amputation  Coronary artery disease status post CABG    Procedures/Studies: Dg Chest 1 View  06/24/2012  *RADIOLOGY REPORT*  Clinical Data: Fever.  Question pneumonia.  CHEST - 1 VIEW  Comparison: PA and lateral chest with 06/23/2012 and 06/22/2012.  Findings: There is cardiomegaly and pulmonary edema.  Somewhat more focal airspace disease is seen in the lung bases, greater on the left. The appearance is not markedly changed.  No pneumothorax. Severe degenerative disease about the right shoulder is noted.  IMPRESSION: Pulmonary edema with somewhat more focal opacities  in the bases, worse on the left, which could be due to atelectasis, more intense edema or pneumonia.  Original Report Authenticated By: Bernadene Bell. Maricela Curet, M.D.   Dg Chest 2 View  06/23/2012  *RADIOLOGY REPORT*  Clinical Data: CHF, COPD  CHEST - 2 VIEW  Comparison: 06/22/2012; 06/05/2011; 04/01/2007  Findings: Grossly unchanged cardiac silhouette and mediastinal contours post median sternotomy and CABG.  Pulmonary vasculature remains indistinct.  Grossly unchanged bibasilar heterogeneous opacities, left greater than right.  Small left-sided effusion is suspected.  No definite pneumothorax.  Grossly unchanged bones including moderate to severe degenerative changes of the thoracolumbar spine.  Extensive degenerative change of the bilateral glenohumeral joints, right greater than left. Surgical clips overlie the right axilla.  IMPRESSION: Overall findings suggestive of pulmonary edema, though note, underlying infection is not excluded.  Original Report Authenticated By: Waynard Reeds, M.D.   Dg Chest 2 View  06/22/2012  *RADIOLOGY REPORT*  Clinical Data: Cough.  Fever.  CHEST - 2 VIEW  Comparison: Two-view chest x-ray 06/05/2011, 04/01/2007, 09/04/2006.  Findings: Prior sternotomy CABG.  Cardiac silhouette enlarged but stable.  Thoracic aorta atherosclerotic, unchanged.  Mildly prominent central pulmonary arteries, unchanged.  Interval development of mild diffuse interstitial pulmonary edema and small bilateral pleural effusions.  Severe degenerative changes involving the right shoulder.  Degenerative changes and DISH involving the thoracic spine.  IMPRESSION: Mild CHF, with stable cardiomegaly, mild diffuse interstitial pulmonary edema, and small bilateral pleural effusions.  Original Report Authenticated By: Arnell Sieving, M.D.   Dg Abd 2 Views  06/23/2012  *RADIOLOGY REPORT*  Clinical Data: Abdominal pain, COPD  ABDOMEN - 2 VIEW  Comparison: Chest radiograph - earlier same day; lumbar spine  radiographs - 04/03/2006  Findings: There is mild gaseous distension of the colon without  definite evidence of obstruction.  Moderate colonic stool burden. No definite pneumoperitoneum on the prior lateral decubitus radiograph.  No definite pneumatosis or portal venous gas. Extensive atherosclerotic calcifications within the abdominal aorta and iliac vasculature.  Limited visualization of the lower thorax suggests an enlarged cardiac silhouette with bibasilar opacities, possibly atelectasis.  Extensive multilevel thoracolumbar spine degenerative change.  IMPRESSION: Moderate colonic stool burden and gaseous distension of the colon most suggestive of ileus.  No definite evidence of obstruction.  Original Report Authenticated By: Waynard Reeds, M.D.     Antibiotics:  Zosyn August 10-August 13  Azithromycin August 10-August 13  Levaquin August 13>>>   Code Status: Full Family Communication: Pt at bedside Disposition Plan: Home when medically stable  Subjective: Patient denies any chest pain, nausea, vomiting, diarrhea, abdominal pain, headache, fever, chills. She still complains of some shortness of breath, but states that this has improved. Nursing reports 1 loose stool in the last 24 hours.  Objective: Filed Vitals:   06/24/12 1646 06/24/12 2056 06/25/12 0433 06/25/12 0953  BP: 115/57 111/49 132/52 115/61  Pulse: 58 52 62   Temp:  98.5 F (36.9 C) 98.3 F (36.8 C)   TempSrc:  Oral Oral   Resp:  20 18   Height:      Weight:   83.779 kg (184 lb 11.2 oz)   SpO2:  96% 95%     Intake/Output Summary (Last 24 hours) at 06/25/12 1046 Last data filed at 06/25/12 0902  Gross per 24 hour  Intake   1346 ml  Output      1 ml  Net   1345 ml   Weight change: 1.979 kg (4 lb 5.8 oz) Exam:   General:  Pt is alert, follows commands appropriately, not in acute distress  HEENT: No icterus, No thrush, No neck mass, [place]/AT  Cardiovascular: Regular rate and rhythm, S1/S2, , no rubs, no  gallops  Respiratory: Bibasilar crackles left greater than right, no wheezes  Abdomen: Soft, non tender, non distended, bowel sounds present, no guarding  Extremities: Right above-the-knee amputation site without any erythema, open wounds; left lower extremity without edema or rashes  Data Reviewed: Basic Metabolic Panel:  Lab 06/25/12 1610 06/24/12 0700 06/23/12 0318 06/22/12 2148 06/22/12 1226  NA 136 137 139 -- 136  K 3.5 3.6 3.8 -- 4.0  CL 97 102 104 -- 105  CO2 18* 23 24 -- --  GLUCOSE 109* 110* 98 -- 105*  BUN 68* 51* 32* -- 25*  CREATININE 3.41* 2.73* 1.92* 1.54* 1.60*  CALCIUM 7.5* 8.5 8.7 -- --  MG -- 2.1 -- 1.9 --  PHOS -- 4.9* -- -- --   Liver Function Tests: No results found for this basename: AST:5,ALT:5,ALKPHOS:5,BILITOT:5,PROT:5,ALBUMIN:5 in the last 168 hours No results found for this basename: LIPASE:5,AMYLASE:5 in the last 168 hours No results found for this basename: AMMONIA:5 in the last 168 hours CBC:  Lab 06/25/12 0635 06/24/12 0700 06/23/12 0318 06/22/12 2148 06/22/12 1226 06/22/12 1159  WBC 5.9 7.2 8.8 10.5 -- 10.9*  NEUTROABS 4.2 -- 6.9 -- -- 8.7*  HGB 8.2* 8.9* 9.0* 10.8* 12.6 --  HCT 24.9* 26.7* 27.8* 33.4* 37.0 --  MCV 70.3* 71.2* 71.1* 72.1* -- 71.7*  PLT 170 199 152 189 -- 217   Cardiac Enzymes:  Lab 06/23/12 0324 06/22/12 2147  CKTOTAL -- --  CKMB -- --  CKMBINDEX -- --  TROPONINI 0.77* 0.39*   BNP: No components found with this basename: POCBNP:5 CBG:  Lab 06/25/12 0610 06/24/12 2211 06/24/12 1605 06/24/12 1109 06/24/12 0705  GLUCAP 98 139* 136* 165* 102*    Recent Results (from the past 240 hour(s))  URINE CULTURE     Status: Normal   Collection Time   06/22/12  2:29 PM      Component Value Range Status Comment   Specimen Description URINE, CATHETERIZED   Final    Special Requests NONE   Final    Culture  Setup Time 06/22/2012 22:56   Final    Colony Count NO GROWTH   Final    Culture NO GROWTH   Final    Report Status  06/23/2012 FINAL   Final   CULTURE, BLOOD (ROUTINE X 2)     Status: Normal (Preliminary result)   Collection Time   06/22/12  9:45 PM      Component Value Range Status Comment   Specimen Description BLOOD LEFT HAND   Final    Special Requests     Final    Value: BOTTLES DRAWN AEROBIC AND ANAEROBIC 10CC BLUE 5CC RED   Culture  Setup Time 06/23/2012 02:12   Final    Culture     Final    Value:        BLOOD CULTURE RECEIVED NO GROWTH TO DATE CULTURE WILL BE HELD FOR 5 DAYS BEFORE ISSUING A FINAL NEGATIVE REPORT   Report Status PENDING   Incomplete   CULTURE, BLOOD (ROUTINE X 2)     Status: Normal (Preliminary result)   Collection Time   06/22/12 10:00 PM      Component Value Range Status Comment   Specimen Description BLOOD LEFT ARM   Final    Special Requests     Final    Value: BOTTLES DRAWN AEROBIC AND ANAEROBIC 10CC BLUE 5CC RED   Culture  Setup Time 06/23/2012 02:12   Final    Culture     Final    Value:        BLOOD CULTURE RECEIVED NO GROWTH TO DATE CULTURE WILL BE HELD FOR 5 DAYS BEFORE ISSUING A FINAL NEGATIVE REPORT   Report Status PENDING   Incomplete      Scheduled Meds:   . allopurinol  100 mg Oral QPM  . aspirin EC  81 mg Oral Daily  . carvedilol  3.125 mg Oral BID WC  . citalopram  10 mg Oral Daily  . cloNIDine  0.2 mg Oral BID  . enoxaparin (LOVENOX) injection  30 mg Subcutaneous Q24H  . ezetimibe  10 mg Oral Daily  . famotidine  20 mg Oral Daily  . hydrALAZINE  75 mg Oral BID  . iron polysaccharides  150 mg Oral BID  . isosorbide dinitrate  40 mg Oral BID  . levofloxacin  750 mg Oral Q48H  . multivitamin  1 tablet Oral QHS  . senna-docusate  2 tablet Oral QHS  . sodium chloride  3 mL Intravenous Q12H  . timolol  1 drop Both Eyes Daily  . DISCONTD: allopurinol  200 mg Oral QPM  . DISCONTD: amLODipine  10 mg Oral Daily  . DISCONTD: azithromycin  500 mg Oral Daily  . DISCONTD: cloNIDine  0.3 mg Oral BID  . DISCONTD: hydrALAZINE  75 mg Oral TID  . DISCONTD:  piperacillin-tazobactam (ZOSYN)  IV  2.25 g Intravenous Q6H  . DISCONTD: piperacillin-tazobactam (ZOSYN)  IV  3.375 g Intravenous Q8H   Continuous Infusions:   . sodium chloride 75 mL/hr at 06/24/12 2158  Caiden Monsivais, DO  [facility] Pager [phone]  If 7PM-7AM, please contact night-coverage [email or web address] Password [email or web address] 06/25/2012, 10:46 AM   LOS: 3 days

## 2012-06-25 NOTE — Plan of Care (Signed)
Problem: Phase I Progression Outcomes Goal: Up in chair, BRP Outcome: Progressing Up to bedside with PT today

## 2012-06-26 ENCOUNTER — Inpatient Hospital Stay (HOSPITAL_COMMUNITY): Payer: Medicare Other

## 2012-06-26 LAB — CARDIAC PANEL(CRET KIN+CKTOT+MB+TROPI)
CK, MB: 2.2 ng/mL (ref 0.3–4.0)
CK, MB: 2 ng/mL (ref 0.3–4.0)
CK, MB: 2 ng/mL (ref 0.3–4.0)
Relative Index: 2.1 (ref 0.0–2.5)
Relative Index: INVALID (ref 0.0–2.5)
Relative Index: INVALID (ref 0.0–2.5)
Total CK: 105 U/L (ref 7–177)
Total CK: 98 U/L (ref 7–177)
Total CK: 98 U/L (ref 7–177)
Troponin I: 0.57 ng/mL (ref <0.30)
Troponin I: 0.45 ng/mL (ref <0.30)
Troponin I: 0.36 ng/mL (ref <0.30)

## 2012-06-26 LAB — CBC
HCT: 24.9 % — ABNORMAL LOW (ref 36.0–46.0)
MCH: 23 pg — ABNORMAL LOW (ref 26.0–34.0)
MCHC: 33.3 g/dL (ref 30.0–36.0)
MCV: 69 fL — ABNORMAL LOW (ref 78.0–100.0)
RDW: 16.6 % — ABNORMAL HIGH (ref 11.5–15.5)

## 2012-06-26 LAB — FERRITIN: Ferritin: 327 ng/mL — ABNORMAL HIGH (ref 10–291)

## 2012-06-26 LAB — RENAL FUNCTION PANEL
BUN: 79 mg/dL — ABNORMAL HIGH (ref 6–23)
Chloride: 100 mEq/L (ref 96–112)
Glucose, Bld: 87 mg/dL (ref 70–99)
Potassium: 3.7 mEq/L (ref 3.5–5.1)

## 2012-06-26 LAB — GLUCOSE, CAPILLARY: Glucose-Capillary: 129 mg/dL — ABNORMAL HIGH (ref 70–99)

## 2012-06-26 LAB — IRON AND TIBC
Iron: 20 ug/dL — ABNORMAL LOW (ref 42–135)
TIBC: 140 ug/dL — ABNORMAL LOW (ref 250–470)

## 2012-06-26 MED ORDER — VANCOMYCIN 50 MG/ML ORAL SOLUTION
125.0000 mg | Freq: Four times a day (QID) | ORAL | Status: DC
  Administered 2012-06-26 – 2012-07-01 (×22): 125 mg via ORAL
  Filled 2012-06-26 (×25): qty 2.5

## 2012-06-26 MED ORDER — SODIUM CHLORIDE 0.45 % IV SOLN
INTRAVENOUS | Status: DC
  Administered 2012-06-26 – 2012-06-27 (×3): via INTRAVENOUS
  Filled 2012-06-26 (×7): qty 1000

## 2012-06-26 MED ORDER — CALCIUM ACETATE 667 MG PO CAPS
667.0000 mg | ORAL_CAPSULE | Freq: Three times a day (TID) | ORAL | Status: DC
  Administered 2012-06-26 – 2012-06-29 (×10): 667 mg via ORAL
  Filled 2012-06-26 (×13): qty 1

## 2012-06-26 MED ORDER — LEVOFLOXACIN 500 MG PO TABS
500.0000 mg | ORAL_TABLET | ORAL | Status: DC
  Administered 2012-06-27: 500 mg via ORAL
  Filled 2012-06-26: qty 1

## 2012-06-26 MED ORDER — SODIUM CHLORIDE 0.9 % IV SOLN: 250.0000 mL | INTRAVENOUS | Status: DC | PRN

## 2012-06-26 NOTE — Progress Notes (Addendum)
Nephrology Service Daily Progress Note  Subjective:  The patient experienced an episode of neck/ear/chest pain overnight, with normal EKG, though mildly elevated troponins (thought secondary to demand ischemia).  C diff returned positive yesterday, and the patient was started on PO vanc.  CXR shows less pulmonary congestion.  Urine output was not strictly recorded overnight, but the patient only notes urinating once overnight, with continued rise in creatinine.  She continues to note poor PO intake.    Objective Vital signs in last 24 hours: Filed Vitals:   06/25/12 2050 06/25/12 2303 06/26/12 0638 06/26/12 0704  BP: 142/65 127/55 124/52 152/53  Pulse: 60 58 63 57  Temp: 97.9 F (36.6 C) 98.7 F (37.1 C)  98.5 F (36.9 C)  TempSrc: Oral Oral  Oral  Resp: 22   22  Height:      Weight:    84.913 kg (187 lb 3.2 oz)  SpO2: 93% 96%  100%   Weight change:   Intake/Output Summary (Last 24 hours) at 06/26/12 0754 Last data filed at 06/25/12 2329  Gross per 24 hour  Intake    963 ml  Output    151 ml  Net    812 ml  UOP: 150 cc + 1 occurrence recorded  General: alert, cooperative, and in no apparent distress HEENT: pupils equal round and reactive to light, vision grossly intact, oropharynx clear and non-erythematous  Neck: supple, no lymphadenopathy Lungs: mildly increased work of respiration, mild course breath sounds heard bilaterally Heart: regular rate and rhythm, no murmurs, gallops, or rubs Abdomen: soft, non-tender, non-distended, normal bowel sounds  Extremities: no cyanosis, clubbing, or edema Neurologic: alert & oriented X3, cranial nerves II-XII intact, strength grossly intact, sensation intact to light touch, Right handed  Labs: Basic Metabolic Panel:  Lab 06/26/12 1610 06/25/12 0635 06/24/12 0700 06/23/12 0318 06/22/12 2148 06/22/12 1226  NA 136 136 137 139 -- 136  K 3.7 3.5 3.6 3.8 -- 4.0  CL 100 97 102 104 -- 105  CO2 18* 18* 23 24 -- --  GLUCOSE 87 109* 110* 98  -- 105*  BUN 79* 68* 51* 32* -- 25*  CREATININE 4.05* 3.41* 2.73* 1.92* 1.54* 1.60*  ALB -- -- -- -- -- --  CALCIUM 8.0* 7.5* 8.5 8.7 -- --  PHOS 7.9* -- 4.9* -- -- --   Liver Function Tests:  Lab 06/26/12 0602  AST --  ALT --  ALKPHOS --  BILITOT --  PROT --  ALBUMIN 2.6*   CBC:  Lab 06/26/12 0602 06/25/12 0635 06/24/12 0700 06/23/12 0318 06/22/12 1159  WBC 5.8 5.9 7.2 8.8 --  NEUTROABS -- 4.2 -- 6.9 8.7*  HGB 8.3* 8.2* 8.9* 9.0* --  HCT 24.9* 24.9* 26.7* 27.8* --  MCV 69.0* 70.3* 71.2* 71.1* --  PLT 189 170 199 152 --   Cardiac Enzymes:  Lab 06/26/12 0026 06/23/12 0324 06/22/12 2147  CKTOTAL 105 -- --  CKMB 2.2 -- --  CKMBINDEX -- -- --  TROPONINI 0.57* 0.77* 0.39*   CBG:  Lab 06/25/12 2112 06/25/12 1635 06/25/12 1140 06/25/12 0610 06/24/12 2211  GLUCAP 81 179* 145* 98 139*    Studies/Results: Dg Chest 1 View  06/24/2012  *RADIOLOGY REPORT*  Clinical Data: Fever.  Question pneumonia.  CHEST - 1 VIEW  Comparison: PA and lateral chest with 06/23/2012 and 06/22/2012.  Findings: There is cardiomegaly and pulmonary edema.  Somewhat more focal airspace disease is seen in the lung bases, greater on the left. The appearance is  not markedly changed.  No pneumothorax. Severe degenerative disease about the right shoulder is noted.  IMPRESSION: Pulmonary edema with somewhat more focal opacities in the bases, worse on the left, which could be due to atelectasis, more intense edema or pneumonia.  Original Report Authenticated By: Bernadene Bell. Maricela Curet, M.D.   Dg Chest 2 View  06/26/2012  *RADIOLOGY REPORT*  Clinical Data: Shortness of breath  CHEST - 2 VIEW  Comparison: 06/24/2012  Findings: Previous CABG.  Mild cardiomegaly stable.  Low lung volumes.  Patchy bilateral interstitial and alveolar edema or infiltrates involving bases more than apices, without convincing change since previous exam.  Vascular clips in the right axilla. Degenerative changes in the shoulders, right worse  than left.  IMPRESSION:  1.  Stable cardiomegaly and bilateral edema/infiltrates.  Original Report Authenticated By: Osa Craver, M.D.   US Renal  06/25/2012  *RADIOLOGY REPORT*  Clinical Data: Acute renal failure  RENAL/URINARY TRACT ULTRASOUND COMPLETE  Comparison:  None.  Findings:  Right Kidney:    10.5 cm in length.  Negative for obstruction or mass.  Left Kidney:  10.5 cm in length.  Negative for obstruction or mass. 2 cm left upper pole cyst appears benign and simple.  Bladder:  Normal  IMPRESSION: Normal renal size.  No obstruction.  Original Report Authenticated By: Camelia Phenes, M.D.   Medications: Scheduled Meds:   . albuterol  2.5 mg Nebulization Once  . allopurinol  100 mg Oral QPM  . aspirin EC  81 mg Oral Daily  . carvedilol  3.125 mg Oral BID WC  . citalopram  10 mg Oral Daily  . cloNIDine  0.2 mg Oral BID  . enoxaparin (LOVENOX) injection  30 mg Subcutaneous Q24H  . ezetimibe  10 mg Oral Daily  . famotidine  20 mg Oral Daily  . hydrALAZINE  75 mg Oral BID  . ipratropium  0.5 mg Nebulization Once  . iron polysaccharides  150 mg Oral BID  . isosorbide dinitrate  40 mg Oral BID  . levofloxacin  750 mg Oral Q48H  . multivitamin  1 tablet Oral QHS  . senna-docusate  2 tablet Oral QHS  . sodium chloride  3 mL Intravenous Q12H  . timolol  1 drop Both Eyes Daily  . vancomycin  125 mg Oral QID  . DISCONTD: allopurinol  200 mg Oral QPM  . DISCONTD: azithromycin  500 mg Oral Daily  . DISCONTD: cloNIDine  0.3 mg Oral BID  . DISCONTD: piperacillin-tazobactam (ZOSYN)  IV  2.25 g Intravenous Q6H  . DISCONTD: piperacillin-tazobactam (ZOSYN)  IV  3.375 g Intravenous Q8H   Continuous Infusions:   . DISCONTD: sodium chloride 75 mL/hr at 06/24/12 2158   PRN Meds:.sodium chloride, acetaminophen, HYDROcodone-acetaminophen, loratadine, ondansetron (ZOFRAN) IV, polyethylene glycol, sodium chloride  I  have reviewed scheduled and prn medications.  Assessment/Plan:  The  patient is an 78 yo woman, history of CKD stage 3 (baseline Cr = 1.3-1.5), CHF (EF 50-55%, grade 2 diastolic dysfunction), CAD, COPD, PVD, TIA, breast cancer, presenting 8/10 with productive cough, SOB, and weakness, found to have pneumonia and CHF exacerbation, consulted 8/13 for acute on chronic kidney disease.  # Acute on Chronic Kidney Disease - the patient has a history of CKD stage 3 (baseline Cr = 1.3-1.5). The patient's acute rise in creatinine likely represents AIN (mildly elevated eosinophils on differential, though no rash) vs ATN (BP drom from 8/10 to 8/11 from 194/80 to 118/49) vs prerenal azotemia (c diff positive  diarrhea).  Also possible multiple myeloma. No evidence of obstruction on renal US.   -pending urine for eosinophils  -strict I's/O's  -agree with stopping zosyn/azithromycin  -continue to hold lasix -SPEP/UPEP pending  # Hypertension - patient initially presented with elevated blood pressures, which have now stabilized  -continue clonidine, coreg, hydralazine   # Anemia - patient presents with chronic anemia, likely due to anemia of chronic disease, possibly with a component of iron deficiency anemia  -iron studies pending   Janalyn Harder, PGY2 Pgr. [phone] 06/26/2012, 7:54 AM  [name] says she's had no diarrhea today.  Cr is still rising.  Bicarb is also low. Will rx IVF with bicarb 80 cc/hr.  Urine EOS pending. C diff is POSITIVE.  SPEP and UPEP are pending. Add PhosLo 667 TID as phosphate binder.  Resume Aranesp, check Fe studies

## 2012-06-26 NOTE — Progress Notes (Signed)
Subjective: No new complaints. Has generalized fatigue.  Objective: Vital signs in last 24 hours: Temp:  [97.9 F (36.6 C)-98.7 F (37.1 C)] 98.5 F (36.9 C) (08/14 0704) Pulse Rate:  [57-63] 57  (08/14 0704) Resp:  [20-22] 22  (08/14 0704) BP: (115-152)/(52-70) 152/53 mmHg (08/14 0704) SpO2:  [93 %-100 %] 100 % (08/14 0704) Weight:  [84.913 kg (187 lb 3.2 oz)] 84.913 kg (187 lb 3.2 oz) (08/14 0704) Weight change:  Last BM Date: 06/25/12  Intake/Output from previous day: 08/13 0701 - 08/14 0700 In: 1023 [P.O.:1020; I.V.:3] Out: 151 [Urine:150; Stool:1] Total I/O In: 240 [P.O.:240] Out: -    Physical Exam: General: Alert, awake, oriented x3, in no acute distress. HEENT: No bruits, no goiter. Heart: Regular rate and rhythm, without murmurs, rubs, gallops. Lungs: Decreased bilateral breath sounds. Abdomen: Soft, nontender, nondistended, positive bowel sounds. Extremities: No clubbing cyanosis or edema with positive pedal pulses on the left, status post right AKA. Neuro: Grossly intact, nonfocal.    Lab Results: Basic Metabolic Panel:  Basename 06/26/12 0602 06/25/12 0635 06/24/12 0700  NA 136 136 --  K 3.7 3.5 --  CL 100 97 --  CO2 18* 18* --  GLUCOSE 87 109* --  BUN 79* 68* --  CREATININE 4.05* 3.41* --  CALCIUM 8.0* 7.5* --  MG -- -- 2.1  PHOS 7.9* -- 4.9*   Liver Function Tests:  Basename 06/26/12 0602  AST --  ALT --  ALKPHOS --  BILITOT --  PROT --  ALBUMIN 2.6*   CBC:  Basename 06/26/12 0602 06/25/12 0635  WBC 5.8 5.9  NEUTROABS -- 4.2  HGB 8.3* 8.2*  HCT 24.9* 24.9*  MCV 69.0* 70.3*  PLT 189 170   Cardiac Enzymes:  Basename 06/26/12 0830 06/26/12 0026  CKTOTAL 98 105  CKMB 2.0 2.2  CKMBINDEX -- --  TROPONINI 0.45* 0.57*   CBG:  Basename 06/25/12 2112 06/25/12 1635 06/25/12 1140 06/25/12 0610 06/24/12 2211 06/24/12 1605  GLUCAP 81 179* 145* 98 139* 136*   Urinalysis:  Basename 06/25/12 2100  COLORURINE YELLOW  LABSPEC 1.023    PHURINE 5.0  GLUCOSEU NEGATIVE  HGBUR NEGATIVE  BILIRUBINUR NEGATIVE  KETONESUR NEGATIVE  PROTEINUR NEGATIVE  UROBILINOGEN 0.2  NITRITE NEGATIVE  LEUKOCYTESUR SMALL*    Recent Results (from the past 240 hour(s))  URINE CULTURE     Status: Normal   Collection Time   06/22/12  2:29 PM      Component Value Range Status Comment   Specimen Description URINE, CATHETERIZED   Final    Special Requests NONE   Final    Culture  Setup Time 06/22/2012 22:56   Final    Colony Count NO GROWTH   Final    Culture NO GROWTH   Final    Report Status 06/23/2012 FINAL   Final   CULTURE, BLOOD (ROUTINE X 2)     Status: Normal (Preliminary result)   Collection Time   06/22/12  9:45 PM      Component Value Range Status Comment   Specimen Description BLOOD LEFT HAND   Final    Special Requests     Final    Value: BOTTLES DRAWN AEROBIC AND ANAEROBIC 10CC BLUE 5CC RED   Culture  Setup Time 06/23/2012 02:12   Final    Culture     Final    Value:        BLOOD CULTURE RECEIVED NO GROWTH TO DATE CULTURE WILL BE HELD FOR 5 DAYS BEFORE  ISSUING A FINAL NEGATIVE REPORT   Report Status PENDING   Incomplete   CULTURE, BLOOD (ROUTINE X 2)     Status: Normal (Preliminary result)   Collection Time   06/22/12 10:00 PM      Component Value Range Status Comment   Specimen Description BLOOD LEFT ARM   Final    Special Requests     Final    Value: BOTTLES DRAWN AEROBIC AND ANAEROBIC 10CC BLUE 5CC RED   Culture  Setup Time 06/23/2012 02:12   Final    Culture     Final    Value:        BLOOD CULTURE RECEIVED NO GROWTH TO DATE CULTURE WILL BE HELD FOR 5 DAYS BEFORE ISSUING A FINAL NEGATIVE REPORT   Report Status PENDING   Incomplete   CLOSTRIDIUM DIFFICILE BY PCR     Status: Abnormal   Collection Time   06/25/12  3:55 PM      Component Value Range Status Comment   C difficile by pcr POSITIVE (*) NEGATIVE Final     Studies/Results: Dg Chest 1 View  06/24/2012  *RADIOLOGY REPORT*  Clinical Data: Fever.  Question  pneumonia.  CHEST - 1 VIEW  Comparison: PA and lateral chest with 06/23/2012 and 06/22/2012.  Findings: There is cardiomegaly and pulmonary edema.  Somewhat more focal airspace disease is seen in the lung bases, greater on the left. The appearance is not markedly changed.  No pneumothorax. Severe degenerative disease about the right shoulder is noted.  IMPRESSION: Pulmonary edema with somewhat more focal opacities in the bases, worse on the left, which could be due to atelectasis, more intense edema or pneumonia.  Original Report Authenticated By: Bernadene Bell. Maricela Curet, M.D.   Dg Chest 2 View  06/26/2012  *RADIOLOGY REPORT*  Clinical Data: Shortness of breath  CHEST - 2 VIEW  Comparison: 06/24/2012  Findings: Previous CABG.  Mild cardiomegaly stable.  Low lung volumes.  Patchy bilateral interstitial and alveolar edema or infiltrates involving bases more than apices, without convincing change since previous exam.  Vascular clips in the right axilla. Degenerative changes in the shoulders, right worse than left.  IMPRESSION:  1.  Stable cardiomegaly and bilateral edema/infiltrates.  Original Report Authenticated By: Osa Craver, M.D.   US Renal  06/25/2012  *RADIOLOGY REPORT*  Clinical Data: Acute renal failure  RENAL/URINARY TRACT ULTRASOUND COMPLETE  Comparison:  None.  Findings:  Right Kidney:    10.5 cm in length.  Negative for obstruction or mass.  Left Kidney:  10.5 cm in length.  Negative for obstruction or mass. 2 cm left upper pole cyst appears benign and simple.  Bladder:  Normal  IMPRESSION: Normal renal size.  No obstruction.  Original Report Authenticated By: Camelia Phenes, M.D.    Medications: Scheduled Meds:    . albuterol  2.5 mg Nebulization Once  . allopurinol  100 mg Oral QPM  . aspirin EC  81 mg Oral Daily  . carvedilol  3.125 mg Oral BID WC  . citalopram  10 mg Oral Daily  . cloNIDine  0.2 mg Oral BID  . enoxaparin (LOVENOX) injection  30 mg Subcutaneous Q24H  .  ezetimibe  10 mg Oral Daily  . famotidine  20 mg Oral Daily  . hydrALAZINE  75 mg Oral BID  . ipratropium  0.5 mg Nebulization Once  . iron polysaccharides  150 mg Oral BID  . isosorbide dinitrate  40 mg Oral BID  . levofloxacin  750  mg Oral Q48H  . multivitamin  1 tablet Oral QHS  . senna-docusate  2 tablet Oral QHS  . sodium chloride  3 mL Intravenous Q12H  . timolol  1 drop Both Eyes Daily  . vancomycin  125 mg Oral QID  . DISCONTD: allopurinol  200 mg Oral QPM  . DISCONTD: azithromycin  500 mg Oral Daily  . DISCONTD: cloNIDine  0.3 mg Oral BID  . DISCONTD: piperacillin-tazobactam (ZOSYN)  IV  2.25 g Intravenous Q6H  . DISCONTD: vancomycin  125 mg Oral QID   Continuous Infusions:    . DISCONTD: sodium chloride 75 mL/hr at 06/24/12 2158   PRN Meds:.sodium chloride, acetaminophen, HYDROcodone-acetaminophen, loratadine, ondansetron (ZOFRAN) IV, polyethylene glycol, sodium chloride  Assessment/Plan:  Active Problems:  Acute diastolic CHF (congestive heart failure)  SIRS (systemic inflammatory response syndrome)  DM2 (diabetes mellitus, type 2)  CKD (chronic kidney disease), stage III  HTN (hypertension)  Breast cancer  PVD (peripheral vascular disease)  COPD (chronic obstructive pulmonary disease)  Pneumonia  Acute on chronic renal failure  C. difficile colitis   #1 acute on chronic renal failure stage III: Baseline creatinine around 1.6. Creatinine continues to worsen. Today is 4.05. Renal ultrasound without evidence for obstruction. Nephrology consultation has been obtained. Current presumed etiology is either acute interstitial nephritis from Zosyn received for pneumonia (urine eosinophils pending), versus ATN due to blood pressure drop 8/10 to 8/11 versus possibly prerenal azotemia with newly diagnosed C. difficile. Agree with strict I.'s and O.'s. We'll discontinue Lasix. Zosyn has been discontinued. I will increase IV fluids as I believe most likely etiology is prerenal  azotemia from C. difficile.  #2 C. difficile colitis: Has been started on contact precautions and by mouth vancomycin per C. difficile protocol.  #3 acute diastolic CHF: We need to make sure Is and Os are been accurately documented. 2-D echocardiogram shows grade 2 diastolic dysfunction with an EF of 50-55%. Not on ACE inhibitor secondary to  renal dysfunction. On metoprolol. Watch respiratory status closely with increased IV fluids.  #4 pneumonia: Zosyn has been discontinued in favor of Levaquin, which has been renally dosed, secondary to possible interstitial nephritis. Patient is now afebrile. We'll continue Levaquin for a total of 10 days.  #5 abnormal troponins: These are trending down. No signs of acute coronary syndrome. She is likely related to her CHF exacerbation.  #6 disposition: Once medically ready, plan is for patient to be DC'd home to the care of her granddaughter. Home health will be set up.   LOS: 4 days   HERNANDEZ ACOSTA,ESTELA [facility] Pager: [phone] 06/26/2012, 10:12 AM

## 2012-06-26 NOTE — Care Management Note (Signed)
    Page 1 of 1   07/01/2012     11:51:22 AM   CARE MANAGEMENT NOTE 07/01/2012  Patient:  [name], [name]   Account Number:  [id number]  Date Initiated:  06/26/2012  Documentation initiated by:  Tera Mater  Subjective/Objective Assessment:   78yo female admitted with CHF.  Pt. lives with children.     Action/Plan:   Discharge planning   Anticipated DC Date:  06/27/2012   Anticipated DC Plan:  HOME W HOME HEALTH SERVICES      DC Planning Services  CM consult      [facility] Choice  HOME HEALTH   Choice offered to / List presented to:  C-1 Patient           Status of service:  In process, will continue to follow Medicare Important Message given?   (If response is "NO", the following Medicare IM given date fields will be blank) Date Medicare IM given:   Date Additional Medicare IM given:    Discharge Disposition:    Per UR Regulation:  Reviewed for med. necessity/level of care/duration of stay  If discussed at Long Length of Stay Meetings, dates discussed:    Comments:  07/01/12-1132-J.Keiri Solano,RN,BSN [phone]      In to speak with patient regarding home health needs at home. Patient reports that she already has home health with [facility]. Attempted to explain the difference to patient who asked me to contact her grandaughter for all of that. Grandaughter Otelia Santee contacted. Choice of agencies given. [facility] has used [facility] in [facility] past. Would like to have [facility] again. Marie,RN with [facility] notified.   06/26/12 1215 Tera Mater, RN, BSN NCM [phone] In to speak with pt. about [facility] services and pt. stated she wanted to speak with her granddaughter before making a decision.  Pt. states she has an aide that comes to help her from [facility], and that they help her quite a bit. NCM to follow for discharge needs.

## 2012-06-26 NOTE — Progress Notes (Signed)
Subjective: 2315: RN reports pt w/ c/o CP that is associated with pain in her bil ears and bil neck. Pt also c/o SOB. At bedside pt continues to report bil ear and neck pain and SOB. She also admits to some chest discomfort when ask.  Objective: Upon arrival to bedside pt noted resting quietly in no obvious distress. Pt somewhat vague about symptoms though does seem to focus on c/o SOB. T- 98.7, BP-127/55, P-58 w/ RRR, RR-20 w/ 02 sats of 96% on 3L [place]. BBS noted w/ bil crackles @ bases and expiratory wheezes L>R. EKG unchanged. CXR continues to show bil pulmonary edema w/ infiltrates and cardiomegaly and notes no significant change since CXR on 06/23/12. Cardiac panel shows CK-MB-2.2, CK 105 and Trop of 0.57 (improved from 0.77 on 06/23/12). Assessment/Plan: 1. Chest pain- Doubt ACS given unchanged EKG. Persistently elevated troponins felt to be demand ischemia in clinical setting of CHF heart strain. However will continue to serial enzymes. Will medicate for pain. 2. SOB- Bil crackles and expiratory wheezes noted, will give Albuterol/Atrovent neb and reassess. Reassessment: 0400: RN reports BBS much improved after neb. Pt received oral meds for pain and has rested through-out remainder of night w/o further c/o.

## 2012-06-26 NOTE — Progress Notes (Signed)
Physical Therapy Treatment Patient Details Name: [name] MRN: [id number] DOB: 1927/04/05 Today's Date: 06/26/2012 Time: 1001-1017 PT Time Calculation (min): 16 min  PT Assessment / Plan / Recommendation Comments on Treatment Session  Able to progress pt with bed>chair transfers this session.  Pt still without prothesis at this time.  Encouraged pt to have daughter bring prosthesis to increase ease of mobility.    Pt reports she normally just performs sliding transfers from bed<>w/c<>3-in-1 at home.      Follow Up Recommendations  Home health PT;Supervision/Assistance - 24 hour    Barriers to Discharge        Equipment Recommendations  None recommended by PT    Recommendations for Other Services    Frequency Min 3X/week   Plan      Precautions / Restrictions Precautions Precautions: Fall Restrictions Weight Bearing Restrictions: No       Mobility  Bed Mobility Bed Mobility: Not assessed Details for Bed Mobility Assistance: pt sitting on EOB upon arrival & positioned in recliner at end of session Transfers Transfers: Sit to Stand;Stand to Sit;Stand Pivot Transfers Sit to Stand: 1: +2 Total assist;With upper extremity assist;From bed Sit to Stand: Patient Percentage: 40% Stand to Sit: 1: +2 Total assist;To chair/3-in-1;With armrests Stand to Sit: Patient Percentage: 30% Stand Pivot Transfers: 1: +2 Total assist Stand Pivot Transfers: Patient Percentage: 20% Details for Transfer Assistance: Cues for sequencing & technique.  Pt able to static stand fairly well but unable to pivot on LLE, therefore required total assist to rotate hips from bed>recliner.   Ambulation/Gait Stairs: No Wheelchair Mobility Wheelchair Mobility: No      PT Goals Acute Rehab PT Goals Time For Goal Achievement: 07/02/12 Potential to Achieve Goals: Good PT Goal: Sit to Stand - Progress: Progressing toward goal PT Transfer Goal: Bed to Chair/Chair to Bed - Progress: Progressing toward  goal  Visit Information  Last PT Received On: 06/26/12 Assistance Needed: +2    Subjective Data      Cognition  Overall Cognitive Status: Appears within functional limits for tasks assessed/performed Arousal/Alertness: Awake/alert Orientation Level: Appears intact for tasks assessed Behavior During Session: [facility] for tasks performed    Balance     End of Session PT - End of Session Equipment Utilized During Treatment: Gait belt Activity Tolerance: Patient tolerated treatment well Patient left: in chair;with call bell/phone within reach Nurse Communication: Mobility status     Verdell Face, [place] [phone] 06/26/2012

## 2012-06-27 LAB — GLUCOSE, CAPILLARY: Glucose-Capillary: 241 mg/dL — ABNORMAL HIGH (ref 70–99)

## 2012-06-27 LAB — CBC
MCH: 23.2 pg — ABNORMAL LOW (ref 26.0–34.0)
MCHC: 33.2 g/dL (ref 30.0–36.0)
MCV: 69.9 fL — ABNORMAL LOW (ref 78.0–100.0)
Platelets: 199 10*3/uL (ref 150–400)
RDW: 16.6 % — ABNORMAL HIGH (ref 11.5–15.5)

## 2012-06-27 LAB — RENAL FUNCTION PANEL
Albumin: 2.7 g/dL — ABNORMAL LOW (ref 3.5–5.2)
BUN: 78 mg/dL — ABNORMAL HIGH (ref 6–23)
Creatinine, Ser: 3.48 mg/dL — ABNORMAL HIGH (ref 0.50–1.10)
Phosphorus: 6.4 mg/dL — ABNORMAL HIGH (ref 2.3–4.6)

## 2012-06-27 LAB — UIFE/LIGHT CHAINS/TP QN, 24-HR UR
Alpha 2, Urine: DETECTED — AB
Beta, Urine: DETECTED — AB

## 2012-06-27 MED ORDER — DARBEPOETIN ALFA-POLYSORBATE 100 MCG/0.5ML IJ SOLN
100.0000 ug | INTRAMUSCULAR | Status: DC
  Administered 2012-06-28: 100 ug via SUBCUTANEOUS
  Filled 2012-06-27 (×2): qty 0.5

## 2012-06-27 MED ORDER — ALBUTEROL SULFATE (5 MG/ML) 0.5% IN NEBU
5.0000 mg | INHALATION_SOLUTION | Freq: Once | RESPIRATORY_TRACT | Status: AC
  Administered 2012-06-28: 5 mg via RESPIRATORY_TRACT

## 2012-06-27 MED ORDER — POTASSIUM CHLORIDE CRYS ER 20 MEQ PO TBCR
40.0000 meq | EXTENDED_RELEASE_TABLET | Freq: Two times a day (BID) | ORAL | Status: AC
  Administered 2012-06-27 (×2): 40 meq via ORAL
  Filled 2012-06-27 (×2): qty 2

## 2012-06-27 MED ORDER — CARVEDILOL 6.25 MG PO TABS
6.2500 mg | ORAL_TABLET | Freq: Two times a day (BID) | ORAL | Status: DC
  Administered 2012-06-27 – 2012-07-01 (×8): 6.25 mg via ORAL
  Filled 2012-06-27 (×10): qty 1

## 2012-06-27 MED ORDER — FUROSEMIDE 80 MG PO TABS
80.0000 mg | ORAL_TABLET | Freq: Every day | ORAL | Status: DC
  Administered 2012-06-27 – 2012-06-28 (×2): 80 mg via ORAL
  Filled 2012-06-27 (×2): qty 1

## 2012-06-27 MED ORDER — IPRATROPIUM BROMIDE 0.02 % IN SOLN
0.5000 mg | Freq: Once | RESPIRATORY_TRACT | Status: AC
  Administered 2012-06-28: 0.5 mg via RESPIRATORY_TRACT

## 2012-06-27 MED ORDER — FERUMOXYTOL INJECTION 510 MG/17 ML
510.0000 mg | INTRAVENOUS | Status: AC
  Administered 2012-06-27 – 2012-06-30 (×2): 510 mg via INTRAVENOUS
  Filled 2012-06-27 (×2): qty 17

## 2012-06-27 NOTE — Progress Notes (Signed)
PT Cancel Note:  Session cancelled due to pt c/o chest pain/discomfort & unable to get a deep breathe.  RN notified & requested to hold session for now.  Will attempt back later today if time allows.    Verdell Face, [place] [phone] 06/27/2012

## 2012-06-27 NOTE — Progress Notes (Signed)
Subjective: A little SOB today. Has been walking with physical therapy.  Objective: Vital signs in last 24 hours: Temp:  [98 F (36.7 C)-98.9 F (37.2 C)] 98.6 F (37 C) (08/15 0456) Pulse Rate:  [62-67] 67  (08/15 0614) Resp:  [22] 22  (08/15 0456) BP: (139-186)/(58-75) 163/72 mmHg (08/15 1028) SpO2:  [94 %-100 %] 97 % (08/15 0456) Weight:  [84.8 kg (186 lb 15.2 oz)] 84.8 kg (186 lb 15.2 oz) (08/15 0456) Weight change:  Last BM Date: 06/26/12  Intake/Output from previous day: 08/14 0701 - 08/15 0700 In: 1961.7 [P.O.:680; I.V.:1281.7] Out: 976 [Urine:975; Stool:1] Total I/O In: 120 [P.O.:120] Out: -    Physical Exam: General: Alert, awake, oriented x3, in no acute distress. HEENT: No bruits, no goiter. Heart: Regular rate and rhythm, without murmurs, rubs, gallops. Lungs:  Mild bibasilar crackles. Abdomen: Soft, nontender, nondistended, positive bowel sounds. Extremities: No clubbing cyanosis or edema with positive pedal pulses on the left, status post right AKA. Neuro: Grossly intact, nonfocal.    Lab Results: Basic Metabolic Panel:  Basename 06/27/12 0540 06/26/12 0602  NA 135 136  K 3.2* 3.7  CL 98 100  CO2 20 18*  GLUCOSE 111* 87  BUN 78* 79*  CREATININE 3.48* 4.05*  CALCIUM 8.3* 8.0*  MG -- --  PHOS 6.4* 7.9*   Liver Function Tests:  Basename 06/27/12 0540 06/26/12 0602  AST -- --  ALT -- --  ALKPHOS -- --  BILITOT -- --  PROT -- --  ALBUMIN 2.7* 2.6*   CBC:  Basename 06/27/12 0540 06/26/12 0602 06/25/12 0635  WBC 6.4 5.8 --  NEUTROABS -- -- 4.2  HGB 8.4* 8.3* --  HCT 25.3* 24.9* --  MCV 69.9* 69.0* --  PLT 199 189 --   Cardiac Enzymes:  Basename 06/26/12 1535 06/26/12 0830 06/26/12 0026  CKTOTAL 98 98 105  CKMB 2.0 2.0 2.2  CKMBINDEX -- -- --  TROPONINI 0.36* 0.45* 0.57*   CBG:  Basename 06/27/12 1104 06/27/12 0634 06/26/12 2222 06/26/12 1625 06/26/12 1058 06/25/12 2112  GLUCAP 184* 115* 146* 164* 129* 81    Urinalysis:  Basename 06/25/12 2100  COLORURINE YELLOW  LABSPEC 1.023  PHURINE 5.0  GLUCOSEU NEGATIVE  HGBUR NEGATIVE  BILIRUBINUR NEGATIVE  KETONESUR NEGATIVE  PROTEINUR NEGATIVE  UROBILINOGEN 0.2  NITRITE NEGATIVE  LEUKOCYTESUR SMALL*    Recent Results (from the past 240 hour(s))  URINE CULTURE     Status: Normal   Collection Time   06/22/12  2:29 PM      Component Value Range Status Comment   Specimen Description URINE, CATHETERIZED   Final    Special Requests NONE   Final    Culture  Setup Time 06/22/2012 22:56   Final    Colony Count NO GROWTH   Final    Culture NO GROWTH   Final    Report Status 06/23/2012 FINAL   Final   CULTURE, BLOOD (ROUTINE X 2)     Status: Normal (Preliminary result)   Collection Time   06/22/12  9:45 PM      Component Value Range Status Comment   Specimen Description BLOOD LEFT HAND   Final    Special Requests     Final    Value: BOTTLES DRAWN AEROBIC AND ANAEROBIC 10CC BLUE 5CC RED   Culture  Setup Time 06/23/2012 02:12   Final    Culture     Final    Value:        BLOOD CULTURE  RECEIVED NO GROWTH TO DATE CULTURE WILL BE HELD FOR 5 DAYS BEFORE ISSUING A FINAL NEGATIVE REPORT   Report Status PENDING   Incomplete   CULTURE, BLOOD (ROUTINE X 2)     Status: Normal (Preliminary result)   Collection Time   06/22/12 10:00 PM      Component Value Range Status Comment   Specimen Description BLOOD LEFT ARM   Final    Special Requests     Final    Value: BOTTLES DRAWN AEROBIC AND ANAEROBIC 10CC BLUE 5CC RED   Culture  Setup Time 06/23/2012 02:12   Final    Culture     Final    Value:        BLOOD CULTURE RECEIVED NO GROWTH TO DATE CULTURE WILL BE HELD FOR 5 DAYS BEFORE ISSUING A FINAL NEGATIVE REPORT   Report Status PENDING   Incomplete   CLOSTRIDIUM DIFFICILE BY PCR     Status: Abnormal   Collection Time   06/25/12  3:55 PM      Component Value Range Status Comment   C difficile by pcr POSITIVE (*) NEGATIVE Final     Studies/Results: Dg  Chest 2 View  06/26/2012  *RADIOLOGY REPORT*  Clinical Data: Shortness of breath  CHEST - 2 VIEW  Comparison: 06/24/2012  Findings: Previous CABG.  Mild cardiomegaly stable.  Low lung volumes.  Patchy bilateral interstitial and alveolar edema or infiltrates involving bases more than apices, without convincing change since previous exam.  Vascular clips in the right axilla. Degenerative changes in the shoulders, right worse than left.  IMPRESSION:  1.  Stable cardiomegaly and bilateral edema/infiltrates.  Original Report Authenticated By: Osa Craver, M.D.    Medications: Scheduled Meds:    . albuterol  2.5 mg Nebulization Once  . allopurinol  100 mg Oral QPM  . aspirin EC  81 mg Oral Daily  . calcium acetate  667 mg Oral TID WC  . carvedilol  6.25 mg Oral BID WC  . citalopram  10 mg Oral Daily  . cloNIDine  0.2 mg Oral BID  . darbepoetin (ARANESP) injection - NON-DIALYSIS  100 mcg Subcutaneous Q Fri-1800  . enoxaparin (LOVENOX) injection  30 mg Subcutaneous Q24H  . ezetimibe  10 mg Oral Daily  . famotidine  20 mg Oral Daily  . ferumoxytol  510 mg Intravenous Q3 days  . furosemide  80 mg Oral Daily  . hydrALAZINE  75 mg Oral BID  . ipratropium  0.5 mg Nebulization Once  . isosorbide dinitrate  40 mg Oral BID  . levofloxacin  500 mg Oral Q48H  . multivitamin  1 tablet Oral QHS  . potassium chloride  40 mEq Oral BID  . senna-docusate  2 tablet Oral QHS  . sodium chloride  3 mL Intravenous Q12H  . timolol  1 drop Both Eyes Daily  . vancomycin  125 mg Oral QID  . DISCONTD: carvedilol  3.125 mg Oral BID WC  . DISCONTD: iron polysaccharides  150 mg Oral BID  . DISCONTD: levofloxacin  750 mg Oral Q48H   Continuous Infusions:    . sodium chloride 0.45 % 1,000 mL with sodium bicarbonate 75 mEq infusion 80 mL/hr at 06/27/12 0354   PRN Meds:.sodium chloride, acetaminophen, HYDROcodone-acetaminophen, loratadine, ondansetron (ZOFRAN) IV, polyethylene glycol, sodium  chloride  Assessment/Plan:  Active Problems:  Acute diastolic CHF (congestive heart failure)  SIRS (systemic inflammatory response syndrome)  DM2 (diabetes mellitus, type 2)  CKD (chronic kidney disease), stage III  HTN (hypertension)  Breast cancer  PVD (peripheral vascular disease)  COPD (chronic obstructive pulmonary disease)  Pneumonia  Acute on chronic renal failure  C. difficile colitis   #1 acute on chronic renal failure stage III: Baseline creatinine around 1.3-1.5. Creatinine with some improvement today, down to 3.48 after a mild fluid challenge yesterday. Renal ultrasound without evidence for obstruction. Nephrology consultation has been obtained. Current presumed etiology is either acute interstitial nephritis from Zosyn received for pneumonia (urine eosinophils pending), versus ATN due to blood pressure drop 8/10 to 8/11 versus possibly prerenal azotemia with newly diagnosed C. difficile. Agree with strict I.'s and O.'s. We'll discontinue Lasix. Zosyn has been discontinued.  #2 C. difficile colitis: Has been started on contact precautions and by mouth vancomycin per C. difficile protocol. Had some diarrhea last night.  #3 acute diastolic CHF: We need to make sure Is and Os are been accurately documented. 2-D echocardiogram shows grade 2 diastolic dysfunction with an EF of 50-55%. Not on ACE inhibitor secondary to  renal dysfunction. On metoprolol. Developed some crackles and mild hypoxia with fluid challenge yesterday. We'll hold fluids. I note that renal has already ordered a dose of Lasix. Will follow.  #4 pneumonia: Zosyn has been discontinued in favor of Levaquin, which has been renally dosed, secondary to possible interstitial nephritis. Patient is now afebrile. We'll continue Levaquin for a total of 10 days.  #5 abnormal troponins: These are trending down. No signs of acute coronary syndrome. She is likely related to her CHF exacerbation.  #6 disposition: Once medically  ready, plan is for patient to be DC'd home to the care of her granddaughter. Home health will be set up.   LOS: 5 days   [facility] Pager: [phone] 06/27/2012, 1:14 PM

## 2012-06-27 NOTE — Progress Notes (Signed)
Pt. Complaining of shortness of breath.  Oxygen saturation 94-95% on 4L oxygen.  Lungs sounds wheezing on expiration bilaterally.  Pt. Sitting in bed and coughing thick clear-blood tinged sputum.  Kirtland Bouchard Schorr on call made aware.  See new orders.  Will continue to monitor patient.

## 2012-06-27 NOTE — Progress Notes (Signed)
Tt reported pt's 02 sat 88. 0n3l.  Went up to 92 % on 3L  Pt had c/o little sob, noe denies SOB,  Sitting on side of bed.  BP 163/72., P  73.  Will con to monitor.  Amanda Pea, Charity fundraiser.

## 2012-06-27 NOTE — Progress Notes (Signed)
Nephrology Service Daily Progress Note  Subjective:  The patient continues to note shortness of breath this morning.  Her diarrhea has improved.  The patient had UOP of 975 last night, and creatinine has started to downtrend.  The patient's BP's were somewhat elevated overnight.   Objective Vital signs in last 24 hours: Filed Vitals:   06/26/12 1330 06/26/12 2151 06/27/12 0456 06/27/12 0614  BP: 139/58 173/74 154/64 186/75  Pulse: 62 63 67 67  Temp: 98 F (36.7 C) 98.9 F (37.2 C) 98.6 F (37 C)   TempSrc: Oral Oral Oral   Resp: 22 22 22    Height:      Weight:  186 lb 15.2 oz (84.8 kg) 186 lb 15.2 oz (84.8 kg)   SpO2: 100% 94% 97%    Weight change:   Intake/Output Summary (Last 24 hours) at 06/27/12 0802 Last data filed at 06/27/12 0700  Gross per 24 hour  Intake 1961.67 ml  Output    976 ml  Net 985.67 ml  UOP: 975  General: alert, cooperative, and in no apparent distress HEENT: pupils equal round and reactive to light, vision grossly intact, oropharynx clear and non-erythematous  Neck: supple, no lymphadenopathy Lungs: mildly increased work of respiration, mild course breath sounds heard bilaterally Heart: regular rate and rhythm, no murmurs, gallops, or rubs Abdomen: soft, non-tender, non-distended, normal bowel sounds  Extremities: no cyanosis, clubbing, or edema Neurologic: alert & oriented X3, cranial nerves II-XII intact, strength grossly intact, sensation intact to light touch, Right handed  Labs: Basic Metabolic Panel:  Lab 06/27/12 1610 06/26/12 0602 06/25/12 0635 06/24/12 0700 06/23/12 0318 06/22/12 2148 06/22/12 1226  NA 135 136 136 137 139 -- 136  K 3.2* 3.7 3.5 3.6 3.8 -- 4.0  CL 98 100 97 102 104 -- 105  CO2 20 18* 18* 23 24 -- --  GLUCOSE 111* 87 109* 110* 98 -- 105*  BUN 78* 79* 68* 51* 32* -- 25*  CREATININE 3.48* 4.05* 3.41* 2.73* 1.92* 1.54* 1.60*  ALB -- -- -- -- -- -- --  CALCIUM 8.3* 8.0* 7.5* 8.5 8.7 -- --  PHOS 6.4* 7.9* -- 4.9* -- -- --     Liver Function Tests:  Lab 06/27/12 0540 06/26/12 0602  AST -- --  ALT -- --  ALKPHOS -- --  BILITOT -- --  PROT -- --  ALBUMIN 2.7* 2.6*   CBC:  Lab 06/27/12 0540 06/26/12 0602 06/25/12 0635 06/24/12 0700 06/23/12 0318 06/22/12 1159  WBC 6.4 5.8 5.9 7.2 -- --  NEUTROABS -- -- 4.2 -- 6.9 8.7*  HGB 8.4* 8.3* 8.2* 8.9* -- --  HCT 25.3* 24.9* 24.9* 26.7* -- --  MCV 69.9* 69.0* 70.3* 71.2* -- --  PLT 199 189 170 199 -- --   Cardiac Enzymes:  Lab 06/26/12 1535 06/26/12 0830 06/26/12 0026 06/23/12 0324 06/22/12 2147  CKTOTAL 98 98 105 -- --  CKMB 2.0 2.0 2.2 -- --  CKMBINDEX -- -- -- -- --  TROPONINI 0.36* 0.45* 0.57* 0.77* 0.39*   CBG:  Lab 06/27/12 0634 06/26/12 2222 06/26/12 1625 06/26/12 1058 06/25/12 2112  GLUCAP 115* 146* 164* 129* 81    Studies/Results: Dg Chest 2 View  06/26/2012  *RADIOLOGY REPORT*  Clinical Data: Shortness of breath  CHEST - 2 VIEW  Comparison: 06/24/2012  Findings: Previous CABG.  Mild cardiomegaly stable.  Low lung volumes.  Patchy bilateral interstitial and alveolar edema or infiltrates involving bases more than apices, without convincing change since previous exam.  Vascular clips in the right axilla. Degenerative changes in the shoulders, right worse than left.  IMPRESSION:  1.  Stable cardiomegaly and bilateral edema/infiltrates.  Original Report Authenticated By: Osa Craver, M.D.   US Renal  06/25/2012  *RADIOLOGY REPORT*  Clinical Data: Acute renal failure  RENAL/URINARY TRACT ULTRASOUND COMPLETE  Comparison:  None.  Findings:  Right Kidney:    10.5 cm in length.  Negative for obstruction or mass.  Left Kidney:  10.5 cm in length.  Negative for obstruction or mass. 2 cm left upper pole cyst appears benign and simple.  Bladder:  Normal  IMPRESSION: Normal renal size.  No obstruction.  Original Report Authenticated By: Camelia Phenes, M.D.   Medications: Scheduled Meds:    . albuterol  2.5 mg Nebulization Once  . allopurinol  100  mg Oral QPM  . aspirin EC  81 mg Oral Daily  . calcium acetate  667 mg Oral TID WC  . carvedilol  3.125 mg Oral BID WC  . citalopram  10 mg Oral Daily  . cloNIDine  0.2 mg Oral BID  . enoxaparin (LOVENOX) injection  30 mg Subcutaneous Q24H  . ezetimibe  10 mg Oral Daily  . famotidine  20 mg Oral Daily  . hydrALAZINE  75 mg Oral BID  . ipratropium  0.5 mg Nebulization Once  . iron polysaccharides  150 mg Oral BID  . isosorbide dinitrate  40 mg Oral BID  . levofloxacin  500 mg Oral Q48H  . multivitamin  1 tablet Oral QHS  . senna-docusate  2 tablet Oral QHS  . sodium chloride  3 mL Intravenous Q12H  . timolol  1 drop Both Eyes Daily  . vancomycin  125 mg Oral QID  . DISCONTD: levofloxacin  750 mg Oral Q48H  . DISCONTD: vancomycin  125 mg Oral QID   Continuous Infusions:    . sodium chloride 0.45 % 1,000 mL with sodium bicarbonate 75 mEq infusion 80 mL/hr at 06/27/12 0354   PRN Meds:.sodium chloride, acetaminophen, HYDROcodone-acetaminophen, loratadine, ondansetron (ZOFRAN) IV, polyethylene glycol, sodium chloride, DISCONTD: sodium chloride  I  have reviewed scheduled and prn medications.  Assessment/Plan:  The patient is an 78 yo woman, history of CKD stage 3 (baseline Cr = 1.3-1.5), CHF (EF 50-55%, grade 2 diastolic dysfunction), CAD, COPD, PVD, TIA, breast cancer, presenting 8/10 with productive cough, SOB, and weakness, found to have pneumonia and CHF exacerbation, consulted 8/13 for acute on chronic kidney disease.  # Acute on Chronic Kidney Disease - the patient has a history of CKD stage 3 (baseline Cr = 1.3-1.5). The patient's acute rise in creatinine likely represents AIN (mildly elevated eosinophils on differential, though no rash) vs ATN (BP drom from 8/10 to 8/11 from 194/80 to 118/49) vs prerenal azotemia (c diff positive diarrhea).  Also possible multiple myeloma. No evidence of obstruction on renal US.   -pending urine for eosinophils (still) -strict I's/O's  -agree  with stopping zosyn/azithromycin  -restart lasix 80/d po -SPEP/UPEP pending   # Hypertension - BP's still elevated -continue clonidine, coreg, hydralazine  -increase coreg to 6.25 BID and watch pulse  # Anemia - patient presents with chronic anemia, likely due to anemia of chronic disease, with a component of iron deficiency anemia  -saturation ratio of 14 (06/26/12) -starting feraheme, 8/15 and 8/18 -restart aranesp  # Pneumonia  -on levaquin 500 QOD  Janalyn Harder, PGY2 Pgr. [phone] 06/27/2012, 8:02 AM  Ms. Renault continues to have decrease in Cr. Urine  EOS --still NO RESULTS.  BP remains high-will increase coreg to 6.25 BID and watch to be sure pulse doesn't decrease too much.  She has orthopnea and CXR looks wet.  I've written for furosemide 80 po today and see if urine output picks up.

## 2012-06-28 DIAGNOSIS — S78119A Complete traumatic amputation at level between unspecified hip and knee, initial encounter: Secondary | ICD-10-CM

## 2012-06-28 DIAGNOSIS — I5031 Acute diastolic (congestive) heart failure: Secondary | ICD-10-CM

## 2012-06-28 DIAGNOSIS — A0472 Enterocolitis due to Clostridium difficile, not specified as recurrent: Secondary | ICD-10-CM

## 2012-06-28 DIAGNOSIS — N179 Acute kidney failure, unspecified: Secondary | ICD-10-CM

## 2012-06-28 DIAGNOSIS — I509 Heart failure, unspecified: Secondary | ICD-10-CM

## 2012-06-28 LAB — RENAL FUNCTION PANEL
Calcium: 9.6 mg/dL (ref 8.4–10.5)
GFR calc Af Amer: 22 mL/min — ABNORMAL LOW (ref >90)
GFR calc non Af Amer: 19 mL/min — ABNORMAL LOW (ref >90)
Glucose, Bld: 159 mg/dL — ABNORMAL HIGH (ref 70–99)
Phosphorus: 3.9 mg/dL (ref 2.3–4.6)
Sodium: 138 mEq/L (ref 135–145)

## 2012-06-28 LAB — GLUCOSE, CAPILLARY

## 2012-06-28 LAB — PROTEIN ELECTROPHORESIS, SERUM
Alpha-2-Globulin: 19.7 % — ABNORMAL HIGH (ref 7.1–11.8)
M-Spike, %: NOT DETECTED g/dL
Total Protein ELP: 5.5 g/dL — ABNORMAL LOW (ref 6.0–8.3)

## 2012-06-28 LAB — CBC
HCT: 31.1 % — ABNORMAL LOW (ref 36.0–46.0)
MCHC: 33.1 g/dL (ref 30.0–36.0)
RDW: 16.7 % — ABNORMAL HIGH (ref 11.5–15.5)

## 2012-06-28 MED ORDER — FUROSEMIDE 10 MG/ML IJ SOLN
INTRAMUSCULAR | Status: AC
  Administered 2012-06-28: 10 mg
  Filled 2012-06-28: qty 4

## 2012-06-28 MED ORDER — FUROSEMIDE 80 MG PO TABS
100.0000 mg | ORAL_TABLET | Freq: Two times a day (BID) | ORAL | Status: DC
  Administered 2012-06-28 – 2012-07-01 (×6): 100 mg via ORAL
  Filled 2012-06-28 (×9): qty 1

## 2012-06-28 MED ORDER — FUROSEMIDE 10 MG/ML IJ SOLN
40.0000 mg | Freq: Once | INTRAMUSCULAR | Status: AC
  Administered 2012-06-28: 40 mg via INTRAVENOUS

## 2012-06-28 NOTE — Progress Notes (Signed)
Physical Therapy Treatment Patient Details Name: KIYANNA BIEGLER MRN: [id number] DOB: 03-Jul-1927 Today's Date: 06/28/2012 Time: 7829-5621 PT Time Calculation (min): 26 min  PT Assessment / Plan / Recommendation Comments on Treatment Session  Patient with increased work of breathing today with O2 sats lower.  However, did slightly better with mobility today.  Continues to need +2 assist to get from bed to chair and back.    Follow Up Recommendations  Home health PT;Supervision/Assistance - 24 hour    Barriers to Discharge        Equipment Recommendations       Recommendations for Other Services    Frequency Min 3X/week   Plan Discharge plan remains appropriate;Frequency remains appropriate    Precautions / Restrictions Precautions Precautions: Fall Restrictions Weight Bearing Restrictions: No       Mobility  Bed Mobility Bed Mobility: Supine to Sit;Sitting - Scoot to Edge of Bed;Sit to Supine Supine to Sit: 4: Min guard;HOB elevated;With rails Sitting - Scoot to Edge of Bed: 4: Min guard;With rail Sit to Supine: 4: Min guard;With rail;HOB elevated Details for Bed Mobility Assistance: Patient able to use rail to move to sitting - no cues needed. Transfers Transfers: Sit to Stand;Stand to Sit;Stand Pivot Transfers Sit to Stand: 1: +2 Total assist;With upper extremity assist;From bed Sit to Stand: Patient Percentage: 60% Stand to Sit: 1: +2 Total assist;To chair/3-in-1;With armrests Stand to Sit: Patient Percentage: 50% Stand Pivot Transfers: 1: +2 Total assist;With armrests Stand Pivot Transfers: Patient Percentage: 50% Details for Transfer Assistance: Verbal cues for technique.  Patient able to stand on LLE and pivot to chair with assist moving hips toward chair. Ambulation/Gait Ambulation/Gait Assistance: Not tested (comment)    Exercises General Exercises - Lower Extremity Ankle Circles/Pumps: AROM;Left;10 reps;Seated Long Arc Quad: AROM;Left;10 reps;Seated Hip  Flexion/Marching: AROM;Left;10 reps;Seated     PT Goals Acute Rehab PT Goals PT Goal: Supine/Side to Sit - Progress: Progressing toward goal PT Goal: Sit at Edge Of Bed - Progress: Progressing toward goal PT Goal: Sit to Stand - Progress: Progressing toward goal PT Transfer Goal: Bed to Chair/Chair to Bed - Progress: Progressing toward goal  Visit Information  Last PT Received On: 06/28/12 Assistance Needed: +2    Subjective Data  Subjective: "I'm having trouble breathing today"   Cognition  Overall Cognitive Status: Appears within functional limits for tasks assessed/performed Arousal/Alertness: Awake/alert Orientation Level: Appears intact for tasks assessed Behavior During Session: [facility] for tasks performed    Balance  Balance Balance Assessed: Yes Dynamic Sitting Balance Dynamic Sitting - Balance Support: Bilateral upper extremity supported;Feet unsupported Dynamic Sitting - Level of Assistance: 5: Stand by assistance Dynamic Sitting - Balance Activities: Lateral lean/weight shifting;Forward lean/weight shifting (Performing LLE exercises in sitting) Dynamic Sitting - Comments: Patient able to sit on EOB x 7 minutes.  Patient performed LLE exercises while maintaining balance.  Noted O2 sats remained 89-91% during sitting.  End of Session PT - End of Session Equipment Utilized During Treatment: Gait belt Activity Tolerance: Patient limited by fatigue;Treatment limited secondary to medical complications (Comment) (Patient limited by dyspnea/work of breathing) Patient left: in chair;with call bell/phone within reach Nurse Communication: Mobility status;Need for lift equipment   GP     Vena Austria 06/28/2012, 3:34 PM Durenda Hurt. Renaldo Fiddler, [facility] Acute Rehab Services Pager [phone]

## 2012-06-28 NOTE — Progress Notes (Signed)
Subjective: Increased shortness of breath today. Is now requiring 4 L of oxygen.  Objective: Vital signs in last 24 hours: Temp:  [98 F (36.7 C)-98.5 F (36.9 C)] 98.2 F (36.8 C) (08/16 0453) Pulse Rate:  [70-76] 72  (08/16 0453) Resp:  [19-22] 22  (08/16 0453) BP: (160-184)/(72-81) 174/73 mmHg (08/16 0453) SpO2:  [93 %-100 %] 100 % (08/16 0453) Weight:  [83 kg (182 lb 15.7 oz)] 83 kg (182 lb 15.7 oz) (08/16 0518) Weight change: -1.913 kg (-4 lb 3.5 oz) Last BM Date: 06/27/12  Intake/Output from previous day: 08/15 0701 - 08/16 0700 In: 480 [P.O.:480] Out: 300 [Urine:300]     Physical Exam: General: Alert, awake, oriented x3. HEENT: No bruits, no goiter. Heart: Regular rate and rhythm, without murmurs, rubs, gallops. Lungs:  Mild bibasilar crackles. Abdomen: Soft, nontender, nondistended, positive bowel sounds. Extremities: No clubbing cyanosis or edema with positive pedal pulses on the left, status post right AKA. Neuro: Grossly intact, nonfocal.    Lab Results: Basic Metabolic Panel:  Basename 06/28/12 0659 06/27/12 0540  NA 138 135  K 4.0 3.2*  CL 99 98  CO2 19 20  GLUCOSE 159* 111*  BUN 61* 78*  CREATININE 2.25* 3.48*  CALCIUM 9.6 8.3*  MG -- --  PHOS 3.9 6.4*   Liver Function Tests:  Basename 06/28/12 0659 06/27/12 0540  AST -- --  ALT -- --  ALKPHOS -- --  BILITOT -- --  PROT -- --  ALBUMIN 3.1* 2.7*   CBC:  Basename 06/28/12 0659 06/27/12 0540  WBC 7.3 6.4  NEUTROABS -- --  HGB 10.3* 8.4*  HCT 31.1* 25.3*  MCV 70.8* 69.9*  PLT 256 199   Cardiac Enzymes:  Basename 06/26/12 1535 06/26/12 0830 06/26/12 0026  CKTOTAL 98 98 105  CKMB 2.0 2.0 2.2  CKMBINDEX -- -- --  TROPONINI 0.36* 0.45* 0.57*   CBG:  Basename 06/27/12 1600 06/27/12 1104 06/27/12 0634 06/26/12 2222 06/26/12 1625 06/26/12 1058  GLUCAP 241* 184* 115* 146* 164* 129*   Urinalysis:  Basename 06/25/12 2100  COLORURINE YELLOW  LABSPEC 1.023  PHURINE 5.0  GLUCOSEU  NEGATIVE  HGBUR NEGATIVE  BILIRUBINUR NEGATIVE  KETONESUR NEGATIVE  PROTEINUR NEGATIVE  UROBILINOGEN 0.2  NITRITE NEGATIVE  LEUKOCYTESUR SMALL*    Recent Results (from the past 240 hour(s))  URINE CULTURE     Status: Normal   Collection Time   06/22/12  2:29 PM      Component Value Range Status Comment   Specimen Description URINE, CATHETERIZED   Final    Special Requests NONE   Final    Culture  Setup Time 06/22/2012 22:56   Final    Colony Count NO GROWTH   Final    Culture NO GROWTH   Final    Report Status 06/23/2012 FINAL   Final   CULTURE, BLOOD (ROUTINE X 2)     Status: Normal (Preliminary result)   Collection Time   06/22/12  9:45 PM      Component Value Range Status Comment   Specimen Description BLOOD LEFT HAND   Final    Special Requests     Final    Value: BOTTLES DRAWN AEROBIC AND ANAEROBIC 10CC BLUE 5CC RED   Culture  Setup Time 06/23/2012 02:12   Final    Culture     Final    Value:        BLOOD CULTURE RECEIVED NO GROWTH TO DATE CULTURE WILL BE HELD FOR 5 DAYS BEFORE  ISSUING A FINAL NEGATIVE REPORT   Report Status PENDING   Incomplete   CULTURE, BLOOD (ROUTINE X 2)     Status: Normal (Preliminary result)   Collection Time   06/22/12 10:00 PM      Component Value Range Status Comment   Specimen Description BLOOD LEFT ARM   Final    Special Requests     Final    Value: BOTTLES DRAWN AEROBIC AND ANAEROBIC 10CC BLUE 5CC RED   Culture  Setup Time 06/23/2012 02:12   Final    Culture     Final    Value:        BLOOD CULTURE RECEIVED NO GROWTH TO DATE CULTURE WILL BE HELD FOR 5 DAYS BEFORE ISSUING A FINAL NEGATIVE REPORT   Report Status PENDING   Incomplete   CLOSTRIDIUM DIFFICILE BY PCR     Status: Abnormal   Collection Time   06/25/12  3:55 PM      Component Value Range Status Comment   C difficile by pcr POSITIVE (*) NEGATIVE Final     Studies/Results: Dg Chest 2 View  06/23/2012  *RADIOLOGY REPORT*  Clinical Data: CHF, COPD  CHEST - 2 VIEW  Comparison:  06/22/2012; 06/05/2011; 04/01/2007  Findings: Grossly unchanged cardiac silhouette and mediastinal contours post median sternotomy and CABG.  Pulmonary vasculature remains indistinct.  Grossly unchanged bibasilar heterogeneous opacities, left greater than right.  Small left-sided effusion is suspected.  No definite pneumothorax.  Grossly unchanged bones including moderate to severe degenerative changes of the thoracolumbar spine.  Extensive degenerative change of the bilateral glenohumeral joints, right greater than left. Surgical clips overlie the right axilla.  IMPRESSION: Overall findings suggestive of pulmonary edema, though note, underlying infection is not excluded.  Original Report Authenticated By: Waynard Reeds, M.D.   Dg Chest 2 View  06/22/2012  *RADIOLOGY REPORT*  Clinical Data: Cough.  Fever.  CHEST - 2 VIEW  Comparison: Two-view chest x-ray 06/05/2011, 04/01/2007, 09/04/2006.  Findings: Prior sternotomy CABG.  Cardiac silhouette enlarged but stable.  Thoracic aorta atherosclerotic, unchanged.  Mildly prominent central pulmonary arteries, unchanged.  Interval development of mild diffuse interstitial pulmonary edema and small bilateral pleural effusions.  Severe degenerative changes involving the right shoulder.  Degenerative changes and DISH involving the thoracic spine.  IMPRESSION: Mild CHF, with stable cardiomegaly, mild diffuse interstitial pulmonary edema, and small bilateral pleural effusions.  Original Report Authenticated By: Arnell Sieving, M.D.   Dg Abd 2 Views  06/23/2012  *RADIOLOGY REPORT*  Clinical Data: Abdominal pain, COPD  ABDOMEN - 2 VIEW  Comparison: Chest radiograph - earlier same day; lumbar spine radiographs - 04/03/2006  Findings: There is mild gaseous distension of the colon without definite evidence of obstruction.  Moderate colonic stool burden. No definite pneumoperitoneum on the prior lateral decubitus radiograph.  No definite pneumatosis or portal venous gas.  Extensive atherosclerotic calcifications within the abdominal aorta and iliac vasculature.  Limited visualization of the lower thorax suggests an enlarged cardiac silhouette with bibasilar opacities, possibly atelectasis.  Extensive multilevel thoracolumbar spine degenerative change.  IMPRESSION: Moderate colonic stool burden and gaseous distension of the colon most suggestive of ileus.  No definite evidence of obstruction.  Original Report Authenticated By: Waynard Reeds, M.D.    Medications: Scheduled Meds:    . albuterol  2.5 mg Nebulization Once  . albuterol  5 mg Nebulization Once  . allopurinol  100 mg Oral QPM  . aspirin EC  81 mg Oral Daily  .  calcium acetate  667 mg Oral TID WC  . carvedilol  6.25 mg Oral BID WC  . citalopram  10 mg Oral Daily  . cloNIDine  0.2 mg Oral BID  . darbepoetin (ARANESP) injection - NON-DIALYSIS  100 mcg Subcutaneous Q Fri-1800  . enoxaparin (LOVENOX) injection  30 mg Subcutaneous Q24H  . ezetimibe  10 mg Oral Daily  . famotidine  20 mg Oral Daily  . ferumoxytol  510 mg Intravenous Q3 days  . furosemide  40 mg Intravenous Once  . furosemide  80 mg Oral Daily  . hydrALAZINE  75 mg Oral BID  . ipratropium  0.5 mg Nebulization Once  . ipratropium  0.5 mg Nebulization Once  . isosorbide dinitrate  40 mg Oral BID  . levofloxacin  500 mg Oral Q48H  . multivitamin  1 tablet Oral QHS  . potassium chloride  40 mEq Oral BID  . senna-docusate  2 tablet Oral QHS  . sodium chloride  3 mL Intravenous Q12H  . timolol  1 drop Both Eyes Daily  . vancomycin  125 mg Oral QID  . DISCONTD: carvedilol  3.125 mg Oral BID WC  . DISCONTD: iron polysaccharides  150 mg Oral BID   Continuous Infusions:    . DISCONTD: sodium chloride 0.45 % 1,000 mL with sodium bicarbonate 75 mEq infusion Stopped (06/27/12 2330)   PRN Meds:.acetaminophen, HYDROcodone-acetaminophen, loratadine, ondansetron (ZOFRAN) IV, polyethylene glycol, sodium chloride, DISCONTD: sodium  chloride  Assessment/Plan:  Active Problems:  Acute diastolic CHF (congestive heart failure)  SIRS (systemic inflammatory response syndrome)  DM2 (diabetes mellitus, type 2)  CKD (chronic kidney disease), stage III  HTN (hypertension)  Breast cancer  PVD (peripheral vascular disease)  COPD (chronic obstructive pulmonary disease)  Pneumonia  Acute on chronic renal failure  C. difficile colitis   #1 acute on chronic renal failure stage III: Baseline creatinine around 1.3-1.5. Creatinine with improvement today, down to 2.25. Renal ultrasound without evidence for obstruction. Current presumed etiology is either ATN due to blood pressure drop 8/10 to 8/11 versus possibly prerenal azotemia with newly diagnosed C. difficile. Acute interstitial nephritis is less likely with no eosinophils identified in the urine. Agree with strict I.'s and O.'s.  Would expect creatinine to start rising with reinitiation of Lasix.  #2 C. difficile colitis: Has been started on contact precautions and by mouth vancomycin per C. difficile protocol. Had some diarrhea last night.  #3 acute diastolic CHF: If input and output are accurately recorded then she is about 4 L positive since admission. 2-D echocardiogram shows grade 2 diastolic dysfunction with an EF of 50-55%. Not on ACE inhibitor secondary to  renal dysfunction. On metoprolol. Is on Lasix 80 by mouth daily started by renal yesterday. We'll give her a 40 IV dose times one today. Please note that chest x-ray shows developing bilateral infiltrates most consistent with pulmonary edema. She is also more hypoxic and requiring 3-4 L of oxygen when she does not use baseline oxygen at home.  #4 questionable pneumonia: I think the better explanation for her infiltrates on chest x-ray is pulmonary edema more so than underlying infection. She does not have leukocytosis or fever that would be indicative of a pneumonia. At this moment I would be inclined to stop all  antibiotics.   #5 abnormal troponins: These are trending down. No signs of acute coronary syndrome. This is likely related to her CHF exacerbation.  #6 disposition: Once medically ready, plan is for patient to be DC'd home to  the care of her granddaughter. Home health will be set up.   LOS: 6 days   [facility] Pager: [phone] 06/28/2012, 9:32 AM

## 2012-06-28 NOTE — Progress Notes (Signed)
Inpatient Diabetes Program Recommendations  AACE/ADA: New Consensus Statement on Inpatient Glycemic Control  Target Ranges:  Prepandial:   less than 140 mg/dL      Peak postprandial:   less than 180 mg/dL (1-2 hours)      Critically ill patients:  140 - 180 mg/dL  Pager:  [phone] Hours:  8 am-10pm   Reason for Visit: History of Diabetes and elevated glucose:  Results for KENYA, KOOK (MRN [id number]) as of 06/28/2012 11:28  Ref. Range 06/27/2012 06:34 06/27/2012 11:04 06/27/2012 16:00 06/28/2012 11:00  Glucose-Capillary Latest Range: 70-99 mg/dL 130 (H) 865 (H) 784 (H) 181 (H)   Inpatient Diabetes Program Recommendations  Correction (SSI): Add Novolog Correction   Alfredia Client PhD, RN, BC-ADM Diabetes Coordinator  Office:  [phone] Team Pager:  [phone]

## 2012-06-28 NOTE — Progress Notes (Signed)
[facility]  Subjective:  Awake, alert, says she has little control over urine and BM (needs cleaning now)   Objective: Vital signs in last 24 hours: Blood pressure 174/73, pulse 72, temperature 98.2 F (36.8 C), temperature source Oral, resp. rate 22, height 5' (1.524 m), weight 83 kg (182 lb 15.7 oz), SpO2 100.00%.    PHYSICAL EXAM General--as above Chest--crackles in bases Heart--no rub Abd--nontender Extr--no edema in L LE  CXR---CHF vs infiltrates  Lab Results:   Lab 06/28/12 0659 06/27/12 0540 06/26/12 0602  NA 138 135 136  K 4.0 3.2* 3.7  CL 99 98 100  CO2 19 20 18*  BUN 61* 78* 79*  CREATININE 2.25* 3.48* 4.05*  ALB -- -- --  GLUCOSE 159* -- --  CALCIUM 9.6 8.3* 8.0*  PHOS 3.9 6.4* 7.9*     Basename 06/28/12 0659 06/27/12 0540  WBC 7.3 6.4  HGB 10.3* 8.4*  HCT 31.1* 25.3*  PLT 256 199    Assessment/Plan: The patient is an 78 yo woman, history of CKD stage 3 (baseline Cr = 1.3-1.5), CHF (EF 50-55%, grade 2 diastolic dysfunction), CAD, COPD, PVD, TIA, breast cancer, presenting 8/10 with productive cough, SOB, and weakness, found to have pneumonia and CHF exacerbation, consulted 8/13 for acute on chronic kidney disease.   1.   Acute on Chronic Kidney Disease - Urine neg for Eos .  No evidence of obstruction on renal US.  Likely from hypotension from ? Pneumonia/ c.diff  -SPEP/UPEP  Both neg for monoclonal protein.  Cr improving.  Agree with additional lasix.  I'm hopeful Cr will continue to decrease 2.  Hypertension - BP's still elevated  -continue clonidine, coreg, hydralazine  -increase coreg to 6.25 BID and watch pulse.  Se if BP comes down with lasix  3.   Anemia - on IV fe and aranesp.  Hgb 10.3 today 4.   Pneumonia -on levaquin 500 QOD 5.  + C diff--on po vanco 6.  CHF--I would suggest increase lasix  To 100 BID.  Discussed with Dr. Ardyth Harps    LOS: 6 days   Marinell Igarashi F 06/28/2012,1:27 PM   .labalb

## 2012-06-29 LAB — RENAL FUNCTION PANEL
Albumin: 2.7 g/dL — ABNORMAL LOW (ref 3.5–5.2)
BUN: 51 mg/dL — ABNORMAL HIGH (ref 6–23)
CO2: 26 mEq/L (ref 19–32)
Calcium: 9.4 mg/dL (ref 8.4–10.5)
Chloride: 100 mEq/L (ref 96–112)
Creatinine, Ser: 1.9 mg/dL — ABNORMAL HIGH (ref 0.50–1.10)
GFR calc Af Amer: 27 mL/min — ABNORMAL LOW (ref >90)
GFR calc non Af Amer: 23 mL/min — ABNORMAL LOW (ref >90)
Glucose, Bld: 111 mg/dL — ABNORMAL HIGH (ref 70–99)
Phosphorus: 3.2 mg/dL (ref 2.3–4.6)
Potassium: 3.9 mEq/L (ref 3.5–5.1)
Sodium: 139 mEq/L (ref 135–145)

## 2012-06-29 LAB — CBC
HCT: 26.6 % — ABNORMAL LOW (ref 36.0–46.0)
Hemoglobin: 8.6 g/dL — ABNORMAL LOW (ref 12.0–15.0)
MCH: 22.9 pg — ABNORMAL LOW (ref 26.0–34.0)
MCHC: 32.3 g/dL (ref 30.0–36.0)
MCV: 70.7 fL — ABNORMAL LOW (ref 78.0–100.0)
Platelets: 250 10*3/uL (ref 150–400)
RBC: 3.76 MIL/uL — ABNORMAL LOW (ref 3.87–5.11)
RDW: 16.7 % — ABNORMAL HIGH (ref 11.5–15.5)
WBC: 6.8 10*3/uL (ref 4.0–10.5)

## 2012-06-29 LAB — GLUCOSE, CAPILLARY
Glucose-Capillary: 109 mg/dL — ABNORMAL HIGH (ref 70–99)
Glucose-Capillary: 180 mg/dL — ABNORMAL HIGH (ref 70–99)
Glucose-Capillary: 173 mg/dL — ABNORMAL HIGH (ref 70–99)
Glucose-Capillary: 121 mg/dL — ABNORMAL HIGH (ref 70–99)

## 2012-06-29 LAB — OCCULT BLOOD X 1 CARD TO LAB, STOOL: Fecal Occult Bld: NEGATIVE

## 2012-06-29 LAB — CULTURE, BLOOD (ROUTINE X 2): Culture: NO GROWTH

## 2012-06-29 MED ORDER — SALINE SPRAY 0.65 % NA SOLN
1.0000 | NASAL | Status: DC | PRN
  Filled 2012-06-29: qty 44

## 2012-06-29 NOTE — Progress Notes (Signed)
[facility]  Subjective:  Awake, alert, breathing easier   Objective: Vital signs in last 24 hours: Blood pressure 169/78, pulse 64, temperature 98.3 F (36.8 C), temperature source Oral, resp. rate 20, height 5' (1.524 m), weight 82.1 kg (181 lb), SpO2 96.00%.    PHYSICAL EXAM General--as above Chest--crackles in bases Heart--no rub Abd--nontender Extr--R AKA, no edema in L LE  Lab Results:   Lab 06/29/12 0530 06/28/12 0659 06/27/12 0540  NA 139 138 135  K 3.9 4.0 3.2*  CL 100 99 98  CO2 26 19 20   BUN 51* 61* 78*  CREATININE 1.90* 2.25* 3.48*  ALB -- -- --  GLUCOSE 111* -- --  CALCIUM 9.4 9.6 8.3*  PHOS 3.2 3.9 6.4*     Basename 06/29/12 0530 06/28/12 0659  WBC 6.8 7.3  HGB 8.6* 10.3*  HCT 26.6* 31.1*  PLT 250 256    Assessment/Plan: The patient is an 78 yo woman, history of CKD stage 3 (baseline Cr = 1.3-1.5), CHF (EF 50-55%, grade 2 diastolic dysfunction), CAD, COPD, PVD, TIA, breast cancer, presenting 8/10 with productive cough, SOB, and weakness, found to have pneumonia and CHF exacerbation, consulted 8/13 for acute on chronic kidney disease.  1. Acute on Chronic Kidney Disease - Urine neg for Eos . No evidence of obstruction on renal US.  Likely from hypotension from ? Pneumonia/ c.diff  -SPEP/UPEP Both neg for monoclonal protein. Cr improving. Agree with additional lasix. I'm hopeful Cr will continue to decrease.  D/C PhosLo  2. Hypertension - BP's still elevated  -continue clonidine, coreg, hydralazine  -increase coreg to 6.25 BID and watch pulse. Se if BP comes down with lasix  3. Anemia - on IV fe and aranesp. Hgb 8.6 today  4. Pneumonia -on levaquin 500 QOD  5. + C diff--on po vanco  6. CHF--Continue lasix  100 po  BID. Suggest check CXR (last on 14 Aug)   CMET, phos, MG, uric acid, CBC in AM  LOS: 7 days   Martie Fulgham F 06/29/2012,11:09 AM   .labalb

## 2012-06-29 NOTE — Progress Notes (Signed)
Subjective: Observed while sleeping, appears to have labored breathing. No family members currently present. Is saturating in the mid to upper 90s on 3 L of oxygen.  Objective: Vital signs in last 24 hours: Temp:  [96.1 F (35.6 C)-98.3 F (36.8 C)] 98.3 F (36.8 C) (08/17 0609) Pulse Rate:  [64-70] 64  (08/17 0609) Resp:  [18-20] 20  (08/17 0609) BP: (117-173)/(76-79) 169/78 mmHg (08/17 0609) SpO2:  [90 %-100 %] 96 % (08/17 0609) Weight:  [82.1 kg (181 lb)] 82.1 kg (181 lb) (08/17 0609) Weight change: -0.9 kg (-1 lb 15.8 oz) Last BM Date: 06/28/12  Intake/Output from previous day: 08/16 0701 - 08/17 0700 In: 240 [P.O.:240] Out: 625 [Urine:625] Total I/O In: 120 [P.O.:120] Out: -    Physical Exam: General: Alert, awake, oriented x3. HEENT: No bruits, no goiter. Heart: Regular rate and rhythm, without murmurs, rubs, gallops. Lungs:  Mild bibasilar crackles. Abdomen: Soft, nontender, nondistended, positive bowel sounds. Extremities: No clubbing cyanosis or edema with positive pedal pulses on the left, status post right AKA. Neuro: Grossly intact, nonfocal.    Lab Results: Basic Metabolic Panel:  Basename 06/29/12 0530 06/28/12 0659  NA 139 138  K 3.9 4.0  CL 100 99  CO2 26 19  GLUCOSE 111* 159*  BUN 51* 61*  CREATININE 1.90* 2.25*  CALCIUM 9.4 9.6  MG -- --  PHOS 3.2 3.9   Liver Function Tests:  Basename 06/29/12 0530 06/28/12 0659  AST -- --  ALT -- --  ALKPHOS -- --  BILITOT -- --  PROT -- --  ALBUMIN 2.7* 3.1*   CBC:  Basename 06/29/12 0530 06/28/12 0659  WBC 6.8 7.3  NEUTROABS -- --  HGB 8.6* 10.3*  HCT 26.6* 31.1*  MCV 70.7* 70.8*  PLT 250 256   Cardiac Enzymes:  Basename 06/26/12 1535  CKTOTAL 98  CKMB 2.0  CKMBINDEX --  TROPONINI 0.36*   CBG:  Basename 06/29/12 1109 06/29/12 0640 06/28/12 2109 06/28/12 1645 06/28/12 1100 06/27/12 1600  GLUCAP 180* 109* 158* 169* 181* 241*     Recent Results (from the past 240 hour(s))  URINE  CULTURE     Status: Normal   Collection Time   06/22/12  2:29 PM      Component Value Range Status Comment   Specimen Description URINE, CATHETERIZED   Final    Special Requests NONE   Final    Culture  Setup Time 06/22/2012 22:56   Final    Colony Count NO GROWTH   Final    Culture NO GROWTH   Final    Report Status 06/23/2012 FINAL   Final   CULTURE, BLOOD (ROUTINE X 2)     Status: Normal (Preliminary result)   Collection Time   06/22/12  9:45 PM      Component Value Range Status Comment   Specimen Description BLOOD LEFT HAND   Final    Special Requests     Final    Value: BOTTLES DRAWN AEROBIC AND ANAEROBIC 10CC BLUE 5CC RED   Culture  Setup Time 06/23/2012 02:12   Final    Culture     Final    Value:        BLOOD CULTURE RECEIVED NO GROWTH TO DATE CULTURE WILL BE HELD FOR 5 DAYS BEFORE ISSUING A FINAL NEGATIVE REPORT   Report Status PENDING   Incomplete   CULTURE, BLOOD (ROUTINE X 2)     Status: Normal (Preliminary result)   Collection Time   06/22/12  10:00 PM      Component Value Range Status Comment   Specimen Description BLOOD LEFT ARM   Final    Special Requests     Final    Value: BOTTLES DRAWN AEROBIC AND ANAEROBIC 10CC BLUE 5CC RED   Culture  Setup Time 06/23/2012 02:12   Final    Culture     Final    Value:        BLOOD CULTURE RECEIVED NO GROWTH TO DATE CULTURE WILL BE HELD FOR 5 DAYS BEFORE ISSUING A FINAL NEGATIVE REPORT   Report Status PENDING   Incomplete   CLOSTRIDIUM DIFFICILE BY PCR     Status: Abnormal   Collection Time   06/25/12  3:55 PM      Component Value Range Status Comment   C difficile by pcr POSITIVE (*) NEGATIVE Final     Studies/Results: Dg Chest 2 View  06/23/2012  *RADIOLOGY REPORT*  Clinical Data: CHF, COPD  CHEST - 2 VIEW  Comparison: 06/22/2012; 06/05/2011; 04/01/2007  Findings: Grossly unchanged cardiac silhouette and mediastinal contours post median sternotomy and CABG.  Pulmonary vasculature remains indistinct.  Grossly unchanged bibasilar  heterogeneous opacities, left greater than right.  Small left-sided effusion is suspected.  No definite pneumothorax.  Grossly unchanged bones including moderate to severe degenerative changes of the thoracolumbar spine.  Extensive degenerative change of the bilateral glenohumeral joints, right greater than left. Surgical clips overlie the right axilla.  IMPRESSION: Overall findings suggestive of pulmonary edema, though note, underlying infection is not excluded.  Original Report Authenticated By: Waynard Reeds, M.D.   Dg Chest 2 View  06/22/2012  *RADIOLOGY REPORT*  Clinical Data: Cough.  Fever.  CHEST - 2 VIEW  Comparison: Two-view chest x-ray 06/05/2011, 04/01/2007, 09/04/2006.  Findings: Prior sternotomy CABG.  Cardiac silhouette enlarged but stable.  Thoracic aorta atherosclerotic, unchanged.  Mildly prominent central pulmonary arteries, unchanged.  Interval development of mild diffuse interstitial pulmonary edema and small bilateral pleural effusions.  Severe degenerative changes involving the right shoulder.  Degenerative changes and DISH involving the thoracic spine.  IMPRESSION: Mild CHF, with stable cardiomegaly, mild diffuse interstitial pulmonary edema, and small bilateral pleural effusions.  Original Report Authenticated By: Arnell Sieving, M.D.   Dg Abd 2 Views  06/23/2012  *RADIOLOGY REPORT*  Clinical Data: Abdominal pain, COPD  ABDOMEN - 2 VIEW  Comparison: Chest radiograph - earlier same day; lumbar spine radiographs - 04/03/2006  Findings: There is mild gaseous distension of the colon without definite evidence of obstruction.  Moderate colonic stool burden. No definite pneumoperitoneum on the prior lateral decubitus radiograph.  No definite pneumatosis or portal venous gas. Extensive atherosclerotic calcifications within the abdominal aorta and iliac vasculature.  Limited visualization of the lower thorax suggests an enlarged cardiac silhouette with bibasilar opacities, possibly  atelectasis.  Extensive multilevel thoracolumbar spine degenerative change.  IMPRESSION: Moderate colonic stool burden and gaseous distension of the colon most suggestive of ileus.  No definite evidence of obstruction.  Original Report Authenticated By: Waynard Reeds, M.D.    Medications: Scheduled Meds:    . albuterol  2.5 mg Nebulization Once  . allopurinol  100 mg Oral QPM  . aspirin EC  81 mg Oral Daily  . calcium acetate  667 mg Oral TID WC  . carvedilol  6.25 mg Oral BID WC  . citalopram  10 mg Oral Daily  . cloNIDine  0.2 mg Oral BID  . darbepoetin (ARANESP) injection - NON-DIALYSIS  100 mcg  Subcutaneous Q Fri-1800  . enoxaparin (LOVENOX) injection  30 mg Subcutaneous Q24H  . ezetimibe  10 mg Oral Daily  . famotidine  20 mg Oral Daily  . ferumoxytol  510 mg Intravenous Q3 days  . furosemide  100 mg Oral BID  . hydrALAZINE  75 mg Oral BID  . ipratropium  0.5 mg Nebulization Once  . isosorbide dinitrate  40 mg Oral BID  . multivitamin  1 tablet Oral QHS  . senna-docusate  2 tablet Oral QHS  . sodium chloride  3 mL Intravenous Q12H  . timolol  1 drop Both Eyes Daily  . vancomycin  125 mg Oral QID  . DISCONTD: furosemide  80 mg Oral Daily   Continuous Infusions:   PRN Meds:.acetaminophen, HYDROcodone-acetaminophen, loratadine, ondansetron (ZOFRAN) IV, polyethylene glycol, sodium chloride  Assessment/Plan:  Active Problems:  Acute diastolic CHF (congestive heart failure)  SIRS (systemic inflammatory response syndrome)  DM2 (diabetes mellitus, type 2)  CKD (chronic kidney disease), stage III  HTN (hypertension)  Breast cancer  PVD (peripheral vascular disease)  COPD (chronic obstructive pulmonary disease)  Pneumonia  Acute on chronic renal failure  C. difficile colitis   #1 acute on chronic renal failure stage III: Baseline creatinine around 1.3-1.5. Creatinine with improvement today, down to 1.90. Renal ultrasound without evidence for obstruction. Current  presumed etiology is either ATN due to blood pressure drop 8/10 to 8/11 versus possibly prerenal azotemia with newly diagnosed C. difficile. strict I.'s and O.'s.   #2 C. difficile colitis: Has been started on contact precautions and by mouth vancomycin per C. difficile protocol.   #3 acute diastolic CHF: Diuresed about 500 cc overnight.. 2-D echocardiogram shows grade 2 diastolic dysfunction with an EF of 50-55%. Not on ACE inhibitor secondary to  renal dysfunction. On metoprolol. Her Lasix was increased from 80 twice daily to 100 twice daily yesterday by renal in addition to an extra 40 mg IV dose. She only diuresed about 500 cc overnight, but her oxygen requirements have decreased. Continue current regimen.  #4 questionable pneumonia: I think the better explanation for her infiltrates on chest x-ray is pulmonary edema more so than underlying infection. She does not have leukocytosis or fever that would be indicative of a pneumonia. At this moment I would be inclined to stop all antibiotics.   #5 abnormal troponins: These are trending down. No signs of acute coronary syndrome. This is likely related to her CHF exacerbation.  #6 disposition: Once medically ready, plan is for patient to be DC'd home to the care of her granddaughter. Home health will be set up.   LOS: 7 days   [facility] Pager: [phone] 06/29/2012, 11:20 AM

## 2012-06-30 LAB — URIC ACID: Uric Acid, Serum: 7.2 mg/dL — ABNORMAL HIGH (ref 2.4–7.0)

## 2012-06-30 LAB — CBC
MCH: 23 pg — ABNORMAL LOW (ref 26.0–34.0)
Platelets: 292 10*3/uL (ref 150–400)
RBC: 4.04 MIL/uL (ref 3.87–5.11)
RDW: 16.8 % — ABNORMAL HIGH (ref 11.5–15.5)
WBC: 7.6 10*3/uL (ref 4.0–10.5)

## 2012-06-30 LAB — PHOSPHORUS: Phosphorus: 2.9 mg/dL (ref 2.3–4.6)

## 2012-06-30 LAB — COMPREHENSIVE METABOLIC PANEL
Albumin: 2.9 g/dL — ABNORMAL LOW (ref 3.5–5.2)
BUN: 42 mg/dL — ABNORMAL HIGH (ref 6–23)
Creatinine, Ser: 1.71 mg/dL — ABNORMAL HIGH (ref 0.50–1.10)
GFR calc Af Amer: 31 mL/min — ABNORMAL LOW (ref >90)
Glucose, Bld: 104 mg/dL — ABNORMAL HIGH (ref 70–99)
Total Protein: 6.6 g/dL (ref 6.0–8.3)

## 2012-06-30 MED ORDER — FUROSEMIDE 10 MG/ML IJ SOLN
40.0000 mg | Freq: Once | INTRAMUSCULAR | Status: AC
  Administered 2012-06-30: 40 mg via INTRAVENOUS
  Filled 2012-06-30: qty 4

## 2012-06-30 NOTE — Progress Notes (Addendum)
Notified of patient having a 2.25 second pause. Upon assessment, patient awake, alert and asymptomatic.   Filed Vitals:   06/30/12 2038  BP: 170/92  Pulse: 70  Temp: 98.1 F (36.7 C)  Resp: 20   Strip printed and to chart.  Will continue to monitor. Troy Sine

## 2012-06-30 NOTE — Progress Notes (Signed)
Subjective: Feels well today. Less short of breath. Reports hardly any diarrhea overnight.  Objective: Vital signs in last 24 hours: Temp:  [98.2 F (36.8 C)-98.6 F (37 C)] 98.4 F (36.9 C) (08/18 0458) Pulse Rate:  [68-74] 74  (08/18 0458) Resp:  [18] 18  (08/18 0458) BP: (154-164)/(66-79) 154/66 mmHg (08/18 0458) SpO2:  [96 %] 96 % (08/18 0458) Weight:  [81.6 kg (179 lb 14.3 oz)] 81.6 kg (179 lb 14.3 oz) (08/18 0458) Weight change: -0.5 kg (-1 lb 1.6 oz) Last BM Date: 06/28/12  Intake/Output from previous day: 08/17 0701 - 08/18 0700 In: 1108 [P.O.:1108] Out: 800 [Urine:800] Total I/O In: 220 [P.O.:220] Out: -    Physical Exam: General: Alert, awake, oriented x3. HEENT: No bruits, no goiter. Heart: Regular rate and rhythm, without murmurs, rubs, gallops. Lungs:  Mild bibasilar crackles. Abdomen: Soft, nontender, nondistended, positive bowel sounds. Extremities: No clubbing cyanosis or edema with positive pedal pulses on the left, status post right AKA. Neuro: Grossly intact, nonfocal.    Lab Results: Basic Metabolic Panel:  Basename 06/30/12 0525 06/29/12 0530  NA 141 139  K 3.6 3.9  CL 98 100  CO2 32 26  GLUCOSE 104* 111*  BUN 42* 51*  CREATININE 1.71* 1.90*  CALCIUM 9.6 9.4  MG 1.6 --  PHOS 2.9 3.2   Liver Function Tests:  Basename 06/30/12 0525 06/29/12 0530  AST 24 --  ALT 18 --  ALKPHOS 84 --  BILITOT 0.3 --  PROT 6.6 --  ALBUMIN 2.9* 2.7*   CBC:  Basename 06/30/12 0525 06/29/12 0530  WBC 7.6 6.8  NEUTROABS -- --  HGB 9.3* 8.6*  HCT 29.2* 26.6*  MCV 72.3* 70.7*  PLT 292 250   CBG:  Basename 06/30/12 1142 06/29/12 2228 06/29/12 1615 06/29/12 1109 06/29/12 0640 06/28/12 2109  GLUCAP 158* 121* 173* 180* 109* 158*     Recent Results (from the past 240 hour(s))  URINE CULTURE     Status: Normal   Collection Time   06/22/12  2:29 PM      Component Value Range Status Comment   Specimen Description URINE, CATHETERIZED   Final    Special Requests NONE   Final    Culture  Setup Time 06/22/2012 22:56   Final    Colony Count NO GROWTH   Final    Culture NO GROWTH   Final    Report Status 06/23/2012 FINAL   Final   CULTURE, BLOOD (ROUTINE X 2)     Status: Normal   Collection Time   06/22/12  9:45 PM      Component Value Range Status Comment   Specimen Description BLOOD LEFT HAND   Final    Special Requests     Final    Value: BOTTLES DRAWN AEROBIC AND ANAEROBIC 10CC BLUE 5CC RED   Culture  Setup Time 06/23/2012 02:12   Final    Culture NO GROWTH 5 DAYS   Final    Report Status 06/29/2012 FINAL   Final   CULTURE, BLOOD (ROUTINE X 2)     Status: Normal   Collection Time   06/22/12 10:00 PM      Component Value Range Status Comment   Specimen Description BLOOD LEFT ARM   Final    Special Requests     Final    Value: BOTTLES DRAWN AEROBIC AND ANAEROBIC 10CC BLUE 5CC RED   Culture  Setup Time 06/23/2012 02:12   Final    Culture NO GROWTH 5  DAYS   Final    Report Status 06/29/2012 FINAL   Final   CLOSTRIDIUM DIFFICILE BY PCR     Status: Abnormal   Collection Time   06/25/12  3:55 PM      Component Value Range Status Comment   C difficile by pcr POSITIVE (*) NEGATIVE Final     Studies/Results: Dg Chest 2 View  06/23/2012  *RADIOLOGY REPORT*  Clinical Data: CHF, COPD  CHEST - 2 VIEW  Comparison: 06/22/2012; 06/05/2011; 04/01/2007  Findings: Grossly unchanged cardiac silhouette and mediastinal contours post median sternotomy and CABG.  Pulmonary vasculature remains indistinct.  Grossly unchanged bibasilar heterogeneous opacities, left greater than right.  Small left-sided effusion is suspected.  No definite pneumothorax.  Grossly unchanged bones including moderate to severe degenerative changes of the thoracolumbar spine.  Extensive degenerative change of the bilateral glenohumeral joints, right greater than left. Surgical clips overlie the right axilla.  IMPRESSION: Overall findings suggestive of pulmonary edema, though  note, underlying infection is not excluded.  Original Report Authenticated By: Waynard Reeds, M.D.   Dg Chest 2 View  06/22/2012  *RADIOLOGY REPORT*  Clinical Data: Cough.  Fever.  CHEST - 2 VIEW  Comparison: Two-view chest x-ray 06/05/2011, 04/01/2007, 09/04/2006.  Findings: Prior sternotomy CABG.  Cardiac silhouette enlarged but stable.  Thoracic aorta atherosclerotic, unchanged.  Mildly prominent central pulmonary arteries, unchanged.  Interval development of mild diffuse interstitial pulmonary edema and small bilateral pleural effusions.  Severe degenerative changes involving the right shoulder.  Degenerative changes and DISH involving the thoracic spine.  IMPRESSION: Mild CHF, with stable cardiomegaly, mild diffuse interstitial pulmonary edema, and small bilateral pleural effusions.  Original Report Authenticated By: Arnell Sieving, M.D.   Dg Abd 2 Views  06/23/2012  *RADIOLOGY REPORT*  Clinical Data: Abdominal pain, COPD  ABDOMEN - 2 VIEW  Comparison: Chest radiograph - earlier same day; lumbar spine radiographs - 04/03/2006  Findings: There is mild gaseous distension of the colon without definite evidence of obstruction.  Moderate colonic stool burden. No definite pneumoperitoneum on the prior lateral decubitus radiograph.  No definite pneumatosis or portal venous gas. Extensive atherosclerotic calcifications within the abdominal aorta and iliac vasculature.  Limited visualization of the lower thorax suggests an enlarged cardiac silhouette with bibasilar opacities, possibly atelectasis.  Extensive multilevel thoracolumbar spine degenerative change.  IMPRESSION: Moderate colonic stool burden and gaseous distension of the colon most suggestive of ileus.  No definite evidence of obstruction.  Original Report Authenticated By: Waynard Reeds, M.D.    Medications: Scheduled Meds:    . albuterol  2.5 mg Nebulization Once  . allopurinol  100 mg Oral QPM  . aspirin EC  81 mg Oral Daily  .  carvedilol  6.25 mg Oral BID WC  . citalopram  10 mg Oral Daily  . cloNIDine  0.2 mg Oral BID  . darbepoetin (ARANESP) injection - NON-DIALYSIS  100 mcg Subcutaneous Q Fri-1800  . enoxaparin (LOVENOX) injection  30 mg Subcutaneous Q24H  . ezetimibe  10 mg Oral Daily  . famotidine  20 mg Oral Daily  . ferumoxytol  510 mg Intravenous Q3 days  . furosemide  100 mg Oral BID  . hydrALAZINE  75 mg Oral BID  . ipratropium  0.5 mg Nebulization Once  . isosorbide dinitrate  40 mg Oral BID  . multivitamin  1 tablet Oral QHS  . senna-docusate  2 tablet Oral QHS  . sodium chloride  3 mL Intravenous Q12H  . timolol  1 drop Both Eyes Daily  . vancomycin  125 mg Oral QID   Continuous Infusions:   PRN Meds:.acetaminophen, HYDROcodone-acetaminophen, loratadine, ondansetron (ZOFRAN) IV, polyethylene glycol, sodium chloride, sodium chloride  Assessment/Plan:  Active Problems:  Acute diastolic CHF (congestive heart failure)  SIRS (systemic inflammatory response syndrome)  DM2 (diabetes mellitus, type 2)  CKD (chronic kidney disease), stage III  HTN (hypertension)  Breast cancer  PVD (peripheral vascular disease)  COPD (chronic obstructive pulmonary disease)  Pneumonia  Acute on chronic renal failure  C. difficile colitis   #1 acute on chronic renal failure stage III: Baseline creatinine around 1.3-1.5. Creatinine with improvement today, down to 1.7. Renal ultrasound without evidence for obstruction. Current presumed etiology is either ATN due to blood pressure drop 8/10 to 8/11 versus possibly prerenal azotemia with newly diagnosed C. difficile. strict I.'s and O.'s.   #2 C. difficile colitis: Has been started on contact precautions and by mouth vancomycin per C. difficile protocol. Diarrhea is much improved.  #3 acute diastolic CHF: Inaccurate Is and Os as patient is incontinent.. 2-D echocardiogram shows grade 2 diastolic dysfunction with an EF of 50-55%. Not on ACE inhibitor secondary to   renal dysfunction. On metoprolol. On Lasix 100 mg twice daily. Will give an extra 40 mg IV dose of Lasix today and will try to titrate off her oxygen.  #4 questionable pneumonia: I think the better explanation for her infiltrates on chest x-ray is pulmonary edema more so than underlying infection. She does not have leukocytosis or fever that would be indicative of a pneumonia. At this moment I would be inclined to stop all antibiotics.   #5 abnormal troponins: These are trending down. No signs of acute coronary syndrome. This is likely related to her CHF exacerbation.  #6 disposition: Once medically ready, plan is for patient to be DC'd home to the care of her granddaughter. Home health will be set up.   LOS: 8 days   [name],ESTELA [facility] Pager: [phone] 06/30/2012, 11:58 AM

## 2012-06-30 NOTE — Progress Notes (Signed)
[facility]  Subjective:  Awake, alert, less diarrhea (po Vanco for c. 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 rate 18, height 5' (1.524 m), weight 81.6 kg (179 lb 14.3 oz), SpO2 96.00%.    PHYSICAL EXAM General--awake, alert Chest--crackles in bases Heart--no rub Abd--nontender Extr--AKA on R, no edema on L  Lab Results:   Lab 06/30/12 0525 06/29/12 0530 06/28/12 0659  NA 141 139 138  K 3.6 3.9 4.0  CL 98 100 99  CO2 32 26 19  BUN 42* 51* 61*  CREATININE 1.71* 1.90* 2.25*  ALB -- -- --  GLUCOSE 104* -- --  CALCIUM 9.6 9.4 9.6  PHOS 2.9 3.2 3.9     Basename 06/30/12 0525 06/29/12 0530  WBC 7.6 6.8  HGB 9.3* 8.6*  HCT 29.2* 26.6*  PLT 292 250   Assessment/Plan: The patient is an 78 yo B woman, history of CKD stage 3 (baseline Cr = 1.3-1.5), CHF (EF 50-55%, grade 2 diastolic dysfunction), CAD, COPD, PVD, TIA, breast cancer, presenting 8/10 with productive cough, SOB, and weakness, found to have pneumonia and CHF exacerbation, consulted 8/13 for acute on chronic kidney disease.  1. Acute on Chronic Kidney Disease - Urine neg for Eos . No evidence of obstruction on renal US.  Likely from hypotension from ? Pneumonia/ c.diff  -SPEP/UPEP Both neg for monoclonal protein. Cr improving. Agree with additional lasix. I'm hopeful Cr will continue to decrease. D/C PhosLo  2. Hypertension - BP's better, but still elevated  -continue clonidine, coreg, hydralazine  -increase coreg to 12.5 BID and watch pulse. See if BP comes down with more lasix  3. Anemia - on IV fe and aranesp. Hgb 9.3 today  4. Pneumonia -levaquin stopped on 15 Aug 5. + C diff--on po vanco  6. CHF--Continue lasix 100 po BID. Suggest check CXR (last on 14  Aug) 7.  Gout--on allopurinol 100/d, uric acid 7.2 (I'd suggest increase to 150/d)--sees Dr. Manus Gunning as primary care doc    LOS: 8 days   Justin Buechner F 06/30/2012,10:52 AM   .Lonzo Candy

## 2012-06-30 NOTE — Plan of Care (Signed)
Problem: Phase II Progression Outcomes Goal: Walk in hall or up in chair TID Outcome: Completed/Met Date Met:  06/30/12 oob to chair today with max assist

## 2012-07-01 LAB — GLUCOSE, CAPILLARY
Glucose-Capillary: 136 mg/dL — ABNORMAL HIGH (ref 70–99)
Glucose-Capillary: 141 mg/dL — ABNORMAL HIGH (ref 70–99)
Glucose-Capillary: 185 mg/dL — ABNORMAL HIGH (ref 70–99)

## 2012-07-01 LAB — CBC
HCT: 32.6 % — ABNORMAL LOW (ref 36.0–46.0)
Hemoglobin: 10.3 g/dL — ABNORMAL LOW (ref 12.0–15.0)
MCH: 22.7 pg — ABNORMAL LOW (ref 26.0–34.0)
MCV: 72 fL — ABNORMAL LOW (ref 78.0–100.0)
RBC: 4.53 MIL/uL (ref 3.87–5.11)

## 2012-07-01 LAB — BASIC METABOLIC PANEL
CO2: 33 mEq/L — ABNORMAL HIGH (ref 19–32)
Calcium: 9.7 mg/dL (ref 8.4–10.5)
Chloride: 96 mEq/L (ref 96–112)
Glucose, Bld: 111 mg/dL — ABNORMAL HIGH (ref 70–99)
Sodium: 141 mEq/L (ref 135–145)

## 2012-07-01 MED ORDER — CLONIDINE HCL 0.2 MG PO TABS: 0.2000 mg | ORAL_TABLET | Freq: Two times a day (BID) | ORAL | Status: AC

## 2012-07-01 MED ORDER — POTASSIUM CHLORIDE ER 10 MEQ PO TBCR: 20.0000 meq | EXTENDED_RELEASE_TABLET | Freq: Every day | ORAL | Status: DC

## 2012-07-01 MED ORDER — DIPHENHYD-HYDROCORT-NYSTATIN MT SUSP: 15.0000 mL | Freq: Two times a day (BID) | OROMUCOSAL | Status: DC

## 2012-07-01 MED ORDER — ALLOPURINOL 100 MG PO TABS: 100.0000 mg | ORAL_TABLET | Freq: Every evening | ORAL | Status: DC

## 2012-07-01 MED ORDER — CARVEDILOL 6.25 MG PO TABS: 6.2500 mg | ORAL_TABLET | Freq: Two times a day (BID) | ORAL | Status: DC

## 2012-07-01 MED ORDER — VANCOMYCIN 50 MG/ML ORAL SOLUTION: 125.0000 mg | Freq: Four times a day (QID) | ORAL | Status: DC

## 2012-07-01 MED ORDER — FUROSEMIDE 40 MG PO TABS: 40.0000 mg | ORAL_TABLET | Freq: Two times a day (BID) | ORAL | Status: DC

## 2012-07-01 NOTE — Discharge Summary (Signed)
Physician Discharge Summary  Patient ID: [name] MRN: [id number] DOB/AGE: Oct 16, 1927 78 y.o.  Admit date: 06/22/2012 Discharge date: 07/01/2012  Primary Care Physician:  Thora Lance, MD   Discharge Diagnoses:    Active Problems:  Acute diastolic CHF (congestive heart failure)  SIRS (systemic inflammatory response syndrome)  DM2 (diabetes mellitus, type 2)  CKD (chronic kidney disease), stage III  HTN (hypertension)  Breast cancer  PVD (peripheral vascular disease)  COPD (chronic obstructive pulmonary disease)  Pneumonia  Acute on chronic renal failure  C. difficile colitis    Medication List  As of 07/01/2012  9:53 AM   STOP taking these medications         colchicine 0.6 MG tablet      oxyCODONE-acetaminophen 7.5-325 MG per tablet         TAKE these medications         allopurinol 100 MG tablet   Commonly known as: ZYLOPRIM   Take 1 tablet (100 mg total) by mouth every evening.      amLODipine 5 MG tablet   Commonly known as: NORVASC   Take 10 mg by mouth daily.      aspirin EC 81 MG tablet   Take 81 mg by mouth daily.      b complex-vitamin c-folic acid 0.8 MG Tabs   Take 0.8 mg by mouth at bedtime.      carvedilol 6.25 MG tablet   Commonly known as: COREG   Take 1 tablet (6.25 mg total) by mouth 2 (two) times daily with a meal.      citalopram 10 MG tablet   Commonly known as: CELEXA   Take 20 mg by mouth daily.      cloNIDine 0.2 MG tablet   Commonly known as: CATAPRES   Take 1 tablet (0.2 mg total) by mouth 2 (two) times daily.      clotrimazole 1 % external solution   Commonly known as: LOTRIMIN   Apply 1 application topically as needed. Apply to fungal rash on foot      ezetimibe 10 MG tablet   Commonly known as: ZETIA   Take 10 mg by mouth daily.      famotidine 20 MG tablet   Commonly known as: PEPCID   Take 20 mg by mouth daily.      FERREX 150 PO   Take 1 tablet by mouth 2 (two) times daily.      furosemide 40 MG  tablet   Commonly known as: LASIX   Take 1 tablet (40 mg total) by mouth 2 (two) times daily.      glimepiride 2 MG tablet   Commonly known as: AMARYL   Take 2 mg by mouth daily before breakfast.      glucosamine-chondroitin 500-400 MG tablet   Take 3 tablets by mouth 3 (three) times daily.      hydrALAZINE 50 MG tablet   Commonly known as: APRESOLINE   Take 75 mg by mouth 3 (three) times daily.      HYDROcodone-acetaminophen 5-325 MG per tablet   Commonly known as: NORCO/VICODIN   Take 1-2 tablets by mouth every 6 (six) hours as needed. For pain      isosorbide dinitrate 40 MG tablet   Commonly known as: ISORDIL   Take 40 mg by mouth 2 (two) times daily.      loratadine 10 MG tablet   Commonly known as: CLARITIN   Take 10 mg by mouth daily as needed.  For allergies.      mupirocin ointment 2 %   Commonly known as: BACTROBAN   Apply 1 application topically 2 (two) times daily as needed. For skin breakouts.      NEUPOGEN IJ   Inject as directed 2 (two) times a week. Twice a month.      oxycodone 5 MG capsule   Commonly known as: OXY-IR   Take 5 mg by mouth every 4 (four) hours as needed. For pain.      polyethylene glycol packet   Commonly known as: MIRALAX / GLYCOLAX   Take 17 g by mouth daily as needed. For constipation.      sennosides-docusate sodium 8.6-50 MG tablet   Commonly known as: SENOKOT-S   Take 2 tablets by mouth daily.      timolol 0.25 % ophthalmic gel-forming   Commonly known as: TIMOPTIC-XR   Place 1 drop into both eyes daily.      triamcinolone cream 0.1 %   Commonly known as: KENALOG   Apply 1 application topically 2 (two) times daily as needed. For skin breakouts.      vancomycin 50 mg/mL oral solution   Commonly known as: VANCOCIN   Take 2.5 mLs (125 mg total) by mouth 4 (four) times daily.             Disposition and Follow-up:  Patient will be discharged home today in stable and improved condition. I have recommended that she  followup with her primary care physician in no more than 10 days for followup on her renal function.  Consults:  nephrology Dr. Caryn Section   Significant Diagnostic Studies:  Dg Chest 2 View  06/23/2012  *RADIOLOGY REPORT*  Clinical Data: CHF, COPD  CHEST - 2 VIEW  Comparison: 06/22/2012; 06/05/2011; 04/01/2007  Findings: Grossly unchanged cardiac silhouette and mediastinal contours post median sternotomy and CABG.  Pulmonary vasculature remains indistinct.  Grossly unchanged bibasilar heterogeneous opacities, left greater than right.  Small left-sided effusion is suspected.  No definite pneumothorax.  Grossly unchanged bones including moderate to severe degenerative changes of the thoracolumbar spine.  Extensive degenerative change of the bilateral glenohumeral joints, right greater than left. Surgical clips overlie the right axilla.  IMPRESSION: Overall findings suggestive of pulmonary edema, though note, underlying infection is not excluded.  Original Report Authenticated By: Waynard Reeds, M.D.   Dg Chest 2 View  06/22/2012  *RADIOLOGY REPORT*  Clinical Data: Cough.  Fever.  CHEST - 2 VIEW  Comparison: Two-view chest x-ray 06/05/2011, 04/01/2007, 09/04/2006.  Findings: Prior sternotomy CABG.  Cardiac silhouette enlarged but stable.  Thoracic aorta atherosclerotic, unchanged.  Mildly prominent central pulmonary arteries, unchanged.  Interval development of mild diffuse interstitial pulmonary edema and small bilateral pleural effusions.  Severe degenerative changes involving the right shoulder.  Degenerative changes and DISH involving the thoracic spine.  IMPRESSION: Mild CHF, with stable cardiomegaly, mild diffuse interstitial pulmonary edema, and small bilateral pleural effusions.  Original Report Authenticated By: Arnell Sieving, M.D.   Dg Abd 2 Views  06/23/2012  *RADIOLOGY REPORT*  Clinical Data: Abdominal pain, COPD  ABDOMEN - 2 VIEW  Comparison: Chest radiograph - earlier same day; lumbar spine  radiographs - 04/03/2006  Findings: There is mild gaseous distension of the colon without definite evidence of obstruction.  Moderate colonic stool burden. No definite pneumoperitoneum on the prior lateral decubitus radiograph.  No definite pneumatosis or portal venous gas. Extensive atherosclerotic calcifications within the abdominal aorta and iliac vasculature.  Limited visualization of  the lower thorax suggests an enlarged cardiac silhouette with bibasilar opacities, possibly atelectasis.  Extensive multilevel thoracolumbar spine degenerative change.  IMPRESSION: Moderate colonic stool burden and gaseous distension of the colon most suggestive of ileus.  No definite evidence of obstruction.  Original Report Authenticated By: Waynard Reeds, M.D.   Brief H and P: For complete details please refer to admission H and P, but in brief pleasant 78 year old female who presents to the hospital with a two-day history of shortness of breath and coughing yellow-brown sputum. The patient has had a chronic cough but states that over the past 2 days she has been producing more brown-yellow sputum. The patient actually has not been feeling well for the past 2 weeks. She states that she has had no energy and has had decreased oral intake. She went to see her primary care provider approximately 4 weeks ago at which time her furosemide was decreased from 40 mg twice a day to 20 mg daily. Even with this change, the patient only intermittently takes the furosemide, and in the past week she has not taken it at all. She states that she feels too tired to up to get the medicines. We're asked to admit her for further evaluation and management.     Hospital Course:  Active Problems:  Acute diastolic CHF (congestive heart failure)  SIRS (systemic inflammatory response syndrome)  DM2 (diabetes mellitus, type 2)  CKD (chronic kidney disease), stage III  HTN (hypertension)  Breast cancer  PVD (peripheral vascular disease)   COPD (chronic obstructive pulmonary disease)  Pneumonia  Acute on chronic renal failure  C. difficile colitis   #1 acute on chronic kidney disease stage III: Baseline creatinine is about 1.5. Patient's creatinine has decreased to 1.6 at time of discharge. Presumed etiology is possibly prerenal azotemia with newly diagnosed C. difficile versus ATN. Appreciate nephrology assistance.  #2 C. difficile colitis: Has 10 days left of by mouth vancomycin to complete at time of discharge. She is no longer having diarrhea.  #3 acute on chronic diastolic CHF: 2-D echo shows grade 2 diastolic dysfunction with an ejection fraction of 50-50%. She is not on an ACE inhibitor secondary to renal dysfunction. She had been receiving Lasix 100 mg twice daily while in the hospital. At time of discharge  we'll decrease this to 40 mg twice daily which is her home dose. Unfortunately input and output have been inaccurate as patient is incontinent of urine. But she was requiring oxygen and she is no longer doing so. A beta blocker has been added to her regimen.  #4 abnormal troponins: Trended down. No sign of acute coronary syndrome. This is likely related to her CHF exacerbation.  #5 questionable pneumonia:  I think the better explanation for the infiltrates on a chest x-ray was pulmonary edema more so than underlying infection. She had no leukocytosis. She did have a fever on admission which I think can be mostly attributed to the C. difficile colitis. She did receive about 4 days of antibiotics for pneumonia which have been discontinued.  Time spent on Discharge: Greater than 30 minutes.  SignedChaya Jan [facility] Pager: [phone] 07/01/2012, 9:53 AM

## 2012-07-01 NOTE — Progress Notes (Signed)
Notified Md of low potassium level prior to discharge. Md called in prescription for potassium at patient's pharmacy for her to take at home. Notified grandson during discharge of patient to make sure to pick up prescription/med from pharmacy.

## 2012-07-01 NOTE — Progress Notes (Signed)
Patient states prosthesis not currently working properly, therefore oxygen sats taken while patient is sitting in chair and result is 97%.  When patient was sleeping in chair, oxygen sats were around 88% but once patient fully awaken oxygen sats went up to 97%. Also patient complained of sore throat. Pain scale 8/10. Assessed inside of mouth and throat and observed small white patchy dots on roof of mouth and back of throat. Notifed MD and will order meds to go home with to address this issue. Will give pain med to patient and continue to monitor.

## 2012-07-01 NOTE — Progress Notes (Signed)
[name] to be D/C'd Home per MD order.  Discussed with the patient and all questions fully answered.   [name], [place]  Home Medication Instructions OZH:[id number]   Printed on:07/01/12 1942  Medication Information                    clotrimazole (LOTRIMIN) 1 % external solution Apply 1 application topically as needed. Apply to fungal rash on foot           triamcinolone (KENALOG) 0.1 % cream Apply 1 application topically 2 (two) times daily as needed. For skin breakouts.           mupirocin (BACTROBAN) 2 % ointment Apply 1 application topically 2 (two) times daily as needed. For skin breakouts.           HYDROcodone-acetaminophen (NORCO) 5-325 MG per tablet Take 1-2 tablets by mouth every 6 (six) hours as needed. For pain           b complex-vitamin c-folic acid (NEPHRO-VITE) 0.8 MG TABS Take 0.8 mg by mouth at bedtime.             famotidine (PEPCID) 20 MG tablet Take 20 mg by mouth daily.            ezetimibe (ZETIA) 10 MG tablet Take 10 mg by mouth daily.             isosorbide dinitrate (ISORDIL) 40 MG tablet Take 40 mg by mouth 2 (two) times daily.            hydrALAZINE (APRESOLINE) 50 MG tablet Take 75 mg by mouth 3 (three) times daily.            amLODipine (NORVASC) 5 MG tablet Take 10 mg by mouth daily.            oxycodone (OXY-IR) 5 MG capsule Take 5 mg by mouth every 4 (four) hours as needed. For pain.           Filgrastim (NEUPOGEN IJ) Inject as directed 2 (two) times a week. Twice a month.           polyethylene glycol (MIRALAX / GLYCOLAX) packet Take 17 g by mouth daily as needed. For constipation.           Polysaccharide Iron Complex (FERREX 150 PO) Take 1 tablet by mouth 2 (two) times daily.            timolol (TIMOPTIC-XR) 0.25 % ophthalmic gel-forming Place 1 drop into both eyes daily.            glimepiride (AMARYL) 2 MG tablet Take 2 mg by mouth daily before breakfast.             sennosides-docusate sodium (SENOKOT-S) 8.6-50 MG  tablet Take 2 tablets by mouth daily.            glucosamine-chondroitin 500-400 MG tablet Take 3 tablets by mouth 3 (three) times daily.            loratadine (CLARITIN) 10 MG tablet Take 10 mg by mouth daily as needed. For allergies.           citalopram (CELEXA) 10 MG tablet Take 20 mg by mouth daily.            aspirin EC 81 MG tablet Take 81 mg by mouth daily.           allopurinol (ZYLOPRIM) 100 MG tablet Take 1 tablet (100 mg total) by mouth every evening.  carvedilol (COREG) 6.25 MG tablet Take 1 tablet (6.25 mg total) by mouth 2 (two) times daily with a meal.           cloNIDine (CATAPRES) 0.2 MG tablet Take 1 tablet (0.2 mg total) by mouth 2 (two) times daily.           vancomycin (VANCOCIN) 50 mg/mL oral solution Take 2.5 mLs (125 mg total) by mouth 4 (four) times daily.           furosemide (LASIX) 40 MG tablet Take 1 tablet (40 mg total) by mouth 2 (two) times daily.           Diphenhyd-Hydrocort-Nystatin SUSP Use as directed 15 mLs in the mouth or throat 2 (two) times daily.           potassium chloride (K-DUR) 10 MEQ tablet Take 2 tablets (20 mEq total) by mouth daily.             VVS, Skin clean, dry and intact without evidence of skin break down, no evidence of skin tears noted. IV catheter discontinued intact. Site without signs and symptoms of complications. Dressing and pressure applied.  An After Visit Summary was printed and given to the patient. Patient escorted via WC, and D/C home via private auto with grandson.  [name] 07/01/2012 7:42 PM

## 2012-07-01 NOTE — Progress Notes (Signed)
Physical Therapy Treatment Patient Details Name: RICKA WESTRA MRN: [id number] DOB: Jan 17, 1927 Today's Date: 07/01/2012 Time: 1478-2956 PT Time Calculation (min): 26 min  PT Assessment / Plan / Recommendation Comments on Treatment Session  Pt continues to defer use of prosthesis & states she usually does not use it at home.  She states she does lateral scooting from surfaces.  Pt able to perform bed>w/c nicely.      Follow Up Recommendations  Home health PT;Supervision/Assistance - 24 hour    Barriers to Discharge        Equipment Recommendations  None recommended by PT    Recommendations for Other Services    Frequency Min 3X/week   Plan Discharge plan remains appropriate;Frequency remains appropriate    Precautions / Restrictions Precautions Precautions: Fall Restrictions Weight Bearing Restrictions: No       Mobility  Bed Mobility Bed Mobility: Supine to Sit;Sitting - Scoot to Edge of Bed Supine to Sit: 6: Modified independent (Device/Increase time);HOB flat;With rails Sitting - Scoot to Edge of Bed: 6: Modified independent (Device/Increase time) Transfers Transfers: Lateral/Scoot Copywriter, advertising Transfers: 1: +2 Total assist Stand Pivot Transfers: Patient Percentage: 40% Lateral/Scoot Transfers: 4: Min guard;Other (comment) (bed>w/c with no sliding board) Details for Transfer Assistance: Pt able to scoot hips from bed>w/c without any physical assistance.  Required +2 assist for w/c>recliner due to not a drop arm recliner.  Attempted stand-pivot with RW but pt unable to manuever RW & pivot feet safely.   Ambulation/Gait Ambulation/Gait Assistance: Not tested (comment)      PT Goals Acute Rehab PT Goals Time For Goal Achievement: 07/02/12 Potential to Achieve Goals: Good PT Goal: Supine/Side to Sit - Progress: Met PT Goal: Sit to Stand - Progress: Not progressing PT Transfer Goal: Bed to Chair/Chair to Bed - Progress: Met (goal met  using lateral scoot technique)  Visit Information  Last PT Received On: 07/01/12 Assistance Needed: +2    Subjective Data  Subjective: "I havent worn my prosthesis in a while now"   Cognition  Overall Cognitive Status: Appears within functional limits for tasks assessed/performed Arousal/Alertness: Awake/alert Orientation Level: Appears intact for tasks assessed Behavior During Session: [facility] for tasks performed    Balance     End of Session PT - End of Session Equipment Utilized During Treatment: Gait belt Activity Tolerance: Patient tolerated treatment well Patient left: in chair;with call bell/phone within reach Nurse Communication: Mobility status    Verdell Face, [place] [phone] 07/01/2012

## 2012-07-02 MED ORDER — VANCOMYCIN 50 MG/ML ORAL SOLUTION: 125.0000 mg | Freq: Four times a day (QID) | ORAL | Status: DC

## 2012-07-02 MED ORDER — CARVEDILOL 6.25 MG PO TABS: 6.2500 mg | ORAL_TABLET | Freq: Two times a day (BID) | ORAL | Status: DC

## 2012-07-02 MED ORDER — DIPHENHYD-HYDROCORT-NYSTATIN MT SUSP: 5.0000 mL | Freq: Two times a day (BID) | OROMUCOSAL | Status: DC

## 2012-07-03 ENCOUNTER — Encounter (HOSPITAL_COMMUNITY): Payer: Medicare Other

## 2012-07-09 ENCOUNTER — Encounter (HOSPITAL_COMMUNITY)
Admission: RE | Admit: 2012-07-09 | Discharge: 2012-07-09 | Disposition: A | Discharge status: Home / Self Care | Payer: Medicare Other | Source: Ambulatory Visit | Attending: Nephrology | Admitting: Nephrology

## 2012-07-09 LAB — IRON AND TIBC
Iron: 55 ug/dL (ref 42–135)
Saturation Ratios: 30 % (ref 20–55)
UIBC: 127 ug/dL (ref 125–400)

## 2012-07-09 MED ORDER — EPOETIN ALFA 20000 UNIT/ML IJ SOLN
20000.0000 [IU] | INTRAMUSCULAR | Status: DC
  Administered 2012-07-09: 20000 [IU] via SUBCUTANEOUS

## 2012-07-09 MED ORDER — EPOETIN ALFA 20000 UNIT/ML IJ SOLN
INTRAMUSCULAR | Status: AC
  Administered 2012-07-09: 20000 [IU] via SUBCUTANEOUS
  Filled 2012-07-09: qty 1

## 2012-07-11 ENCOUNTER — Encounter (HOSPITAL_COMMUNITY): Payer: Medicare Other

## 2012-07-23 ENCOUNTER — Encounter (HOSPITAL_COMMUNITY)
Admission: RE | Admit: 2012-07-23 | Discharge: 2012-07-23 | Disposition: A | Discharge status: Home / Self Care | Payer: Medicare Other | Source: Ambulatory Visit | Attending: Nephrology | Admitting: Nephrology

## 2012-07-23 DIAGNOSIS — D638 Anemia in other chronic diseases classified elsewhere: Secondary | ICD-10-CM | POA: Insufficient documentation

## 2012-07-23 DIAGNOSIS — N183 Chronic kidney disease, stage 3 unspecified: Secondary | ICD-10-CM | POA: Insufficient documentation

## 2012-07-23 MED ORDER — EPOETIN ALFA 20000 UNIT/ML IJ SOLN: 20000.0000 [IU] | INTRAMUSCULAR | Status: DC

## 2012-08-06 ENCOUNTER — Encounter (HOSPITAL_COMMUNITY)
Admission: RE | Admit: 2012-08-06 | Discharge: 2012-08-06 | Disposition: A | Discharge status: Home / Self Care | Payer: Medicare Other | Source: Ambulatory Visit | Attending: Nephrology | Admitting: Nephrology

## 2012-08-06 LAB — IRON AND TIBC
Iron: 82 ug/dL (ref 42–135)
UIBC: 72 ug/dL — ABNORMAL LOW (ref 125–400)

## 2012-08-06 LAB — FERRITIN: Ferritin: 914 ng/mL — ABNORMAL HIGH (ref 10–291)

## 2012-08-06 MED ORDER — EPOETIN ALFA 20000 UNIT/ML IJ SOLN
20000.0000 [IU] | INTRAMUSCULAR | Status: DC
Start: 2012-08-06 — End: 2012-08-07
  Administered 2012-08-06: 20000 [IU] via SUBCUTANEOUS
  Filled 2012-08-06: qty 1

## 2012-08-14 ENCOUNTER — Other Ambulatory Visit (HOSPITAL_COMMUNITY): Payer: Self-pay | Admitting: Internal Medicine

## 2012-08-19 ENCOUNTER — Other Ambulatory Visit (HOSPITAL_COMMUNITY): Payer: Self-pay | Admitting: *Deleted

## 2012-08-20 ENCOUNTER — Encounter (HOSPITAL_COMMUNITY)
Admission: RE | Admit: 2012-08-20 | Discharge: 2012-08-20 | Disposition: A | Discharge status: Home / Self Care | Payer: Medicare Other | Source: Ambulatory Visit | Attending: Nephrology | Admitting: Nephrology

## 2012-08-20 DIAGNOSIS — N183 Chronic kidney disease, stage 3 unspecified: Secondary | ICD-10-CM | POA: Insufficient documentation

## 2012-08-20 DIAGNOSIS — D638 Anemia in other chronic diseases classified elsewhere: Secondary | ICD-10-CM | POA: Insufficient documentation

## 2012-08-20 MED ORDER — EPOETIN ALFA 20000 UNIT/ML IJ SOLN
INTRAMUSCULAR | Status: AC
  Filled 2012-08-20: qty 1

## 2012-08-20 MED ORDER — EPOETIN ALFA 20000 UNIT/ML IJ SOLN
20000.0000 [IU] | INTRAMUSCULAR | Status: DC
  Administered 2012-08-20: 20000 [IU] via SUBCUTANEOUS

## 2012-09-03 ENCOUNTER — Encounter (HOSPITAL_COMMUNITY)
Admission: RE | Admit: 2012-09-03 | Discharge: 2012-09-03 | Disposition: A | Discharge status: Home / Self Care | Payer: Medicare Other | Source: Ambulatory Visit | Attending: Nephrology | Admitting: Nephrology

## 2012-09-03 LAB — IRON AND TIBC
Iron: 98 ug/dL (ref 42–135)
Saturation Ratios: 61 % — ABNORMAL HIGH (ref 20–55)

## 2012-09-03 MED ORDER — EPOETIN ALFA 20000 UNIT/ML IJ SOLN
20000.0000 [IU] | INTRAMUSCULAR | Status: DC
Start: 2012-09-03 — End: 2012-09-04
  Administered 2012-09-03: 20000 [IU] via SUBCUTANEOUS

## 2012-09-03 MED ORDER — EPOETIN ALFA 20000 UNIT/ML IJ SOLN
INTRAMUSCULAR | Status: AC
  Administered 2012-09-03: 20000 [IU] via SUBCUTANEOUS
  Filled 2012-09-03: qty 1

## 2012-09-18 ENCOUNTER — Encounter (HOSPITAL_COMMUNITY)
Admission: RE | Admit: 2012-09-18 | Discharge: 2012-09-18 | Disposition: A | Discharge status: Home / Self Care | Payer: Medicare Other | Source: Ambulatory Visit | Attending: Nephrology | Admitting: Nephrology

## 2012-09-18 DIAGNOSIS — N183 Chronic kidney disease, stage 3 unspecified: Secondary | ICD-10-CM | POA: Insufficient documentation

## 2012-09-18 DIAGNOSIS — D638 Anemia in other chronic diseases classified elsewhere: Secondary | ICD-10-CM | POA: Insufficient documentation

## 2012-09-18 LAB — POCT HEMOGLOBIN-HEMACUE: Hemoglobin: 10.7 g/dL — ABNORMAL LOW (ref 12.0–15.0)

## 2012-09-18 MED ORDER — EPOETIN ALFA 20000 UNIT/ML IJ SOLN
INTRAMUSCULAR | Status: AC
  Filled 2012-09-18: qty 1

## 2012-09-18 MED ORDER — EPOETIN ALFA 20000 UNIT/ML IJ SOLN
20000.0000 [IU] | INTRAMUSCULAR | Status: DC
  Administered 2012-09-18: 20000 [IU] via SUBCUTANEOUS

## 2012-10-02 ENCOUNTER — Encounter (HOSPITAL_COMMUNITY)
Admission: RE | Admit: 2012-10-02 | Discharge: 2012-10-02 | Disposition: A | Discharge status: Home / Self Care | Payer: Medicare Other | Source: Ambulatory Visit | Attending: Nephrology | Admitting: Nephrology

## 2012-10-02 LAB — IRON AND TIBC
Iron: 71 ug/dL (ref 42–135)
Saturation Ratios: 43 % (ref 20–55)
UIBC: 96 ug/dL — ABNORMAL LOW (ref 125–400)

## 2012-10-02 MED ORDER — EPOETIN ALFA 20000 UNIT/ML IJ SOLN
INTRAMUSCULAR | Status: AC
  Filled 2012-10-02: qty 1

## 2012-10-02 MED ORDER — EPOETIN ALFA 20000 UNIT/ML IJ SOLN
20000.0000 [IU] | INTRAMUSCULAR | Status: DC
  Administered 2012-10-02: 20000 [IU] via SUBCUTANEOUS

## 2012-10-03 ENCOUNTER — Other Ambulatory Visit (HOSPITAL_COMMUNITY): Payer: Self-pay | Admitting: Internal Medicine

## 2012-10-16 ENCOUNTER — Encounter (HOSPITAL_COMMUNITY)
Admission: RE | Admit: 2012-10-16 | Discharge: 2012-10-16 | Disposition: A | Discharge status: Home / Self Care | Payer: Medicare Other | Source: Ambulatory Visit | Attending: Nephrology | Admitting: Nephrology

## 2012-10-16 DIAGNOSIS — D638 Anemia in other chronic diseases classified elsewhere: Secondary | ICD-10-CM | POA: Insufficient documentation

## 2012-10-16 DIAGNOSIS — N183 Chronic kidney disease, stage 3 unspecified: Secondary | ICD-10-CM | POA: Insufficient documentation

## 2012-10-16 LAB — POCT HEMOGLOBIN-HEMACUE: Hemoglobin: 10.5 g/dL — ABNORMAL LOW (ref 12.0–15.0)

## 2012-10-16 MED ORDER — EPOETIN ALFA 20000 UNIT/ML IJ SOLN
20000.0000 [IU] | INTRAMUSCULAR | Status: DC
  Administered 2012-10-16: 20000 [IU] via SUBCUTANEOUS

## 2012-10-16 MED ORDER — EPOETIN ALFA 20000 UNIT/ML IJ SOLN
INTRAMUSCULAR | Status: AC
  Administered 2012-10-16: 20000 [IU] via SUBCUTANEOUS
  Filled 2012-10-16: qty 1

## 2012-10-30 ENCOUNTER — Encounter (HOSPITAL_COMMUNITY)
Admission: RE | Admit: 2012-10-30 | Discharge: 2012-10-30 | Disposition: A | Discharge status: Home / Self Care | Payer: Medicare Other | Source: Ambulatory Visit | Attending: Nephrology | Admitting: Nephrology

## 2012-10-30 LAB — IRON AND TIBC: Iron: 64 ug/dL (ref 42–135)

## 2012-10-30 LAB — FERRITIN: Ferritin: 490 ng/mL — ABNORMAL HIGH (ref 10–291)

## 2012-10-30 MED ORDER — EPOETIN ALFA 20000 UNIT/ML IJ SOLN
20000.0000 [IU] | INTRAMUSCULAR | Status: DC
  Administered 2012-10-30: 20000 [IU] via SUBCUTANEOUS

## 2012-10-30 MED ORDER — EPOETIN ALFA 20000 UNIT/ML IJ SOLN
INTRAMUSCULAR | Status: AC
  Filled 2012-10-30: qty 1

## 2012-11-14 ENCOUNTER — Other Ambulatory Visit (HOSPITAL_COMMUNITY): Payer: Self-pay | Admitting: *Deleted

## 2012-11-15 ENCOUNTER — Encounter (HOSPITAL_COMMUNITY)
Admission: RE | Admit: 2012-11-15 | Discharge: 2012-11-15 | Disposition: A | Discharge status: Home / Self Care | Payer: Medicare Other | Source: Ambulatory Visit | Attending: Nephrology | Admitting: Nephrology

## 2012-11-15 DIAGNOSIS — N183 Chronic kidney disease, stage 3 unspecified: Secondary | ICD-10-CM | POA: Insufficient documentation

## 2012-11-15 DIAGNOSIS — D638 Anemia in other chronic diseases classified elsewhere: Secondary | ICD-10-CM | POA: Insufficient documentation

## 2012-11-15 LAB — POCT HEMOGLOBIN-HEMACUE
Hemoglobin: 10.8 g/dL — ABNORMAL LOW (ref 12.0–15.0)
Hemoglobin: 13.8 g/dL (ref 12.0–15.0)

## 2012-11-15 MED ORDER — EPOETIN ALFA 20000 UNIT/ML IJ SOLN
20000.0000 [IU] | INTRAMUSCULAR | Status: DC
  Administered 2012-11-15: 20000 [IU] via SUBCUTANEOUS

## 2012-11-15 MED ORDER — EPOETIN ALFA 20000 UNIT/ML IJ SOLN
INTRAMUSCULAR | Status: AC
  Filled 2012-11-15: qty 1

## 2012-11-28 ENCOUNTER — Encounter (HOSPITAL_COMMUNITY)
Admission: RE | Admit: 2012-11-28 | Discharge: 2012-11-28 | Disposition: A | Discharge status: Home / Self Care | Payer: Medicare Other | Source: Ambulatory Visit | Attending: Nephrology | Admitting: Nephrology

## 2012-11-28 LAB — IRON AND TIBC
Iron: 77 ug/dL (ref 42–135)
Saturation Ratios: 49 % (ref 20–55)
UIBC: 81 ug/dL — ABNORMAL LOW (ref 125–400)

## 2012-11-28 MED ORDER — EPOETIN ALFA 20000 UNIT/ML IJ SOLN
INTRAMUSCULAR | Status: AC
  Filled 2012-11-28: qty 1

## 2012-11-28 MED ORDER — EPOETIN ALFA 20000 UNIT/ML IJ SOLN
20000.0000 [IU] | INTRAMUSCULAR | Status: DC
  Administered 2012-11-28: 20000 [IU] via SUBCUTANEOUS

## 2012-12-13 ENCOUNTER — Encounter (HOSPITAL_COMMUNITY)
Admission: RE | Admit: 2012-12-13 | Discharge: 2012-12-13 | Disposition: A | Discharge status: Home / Self Care | Payer: Medicare Other | Source: Ambulatory Visit | Attending: Nephrology | Admitting: Nephrology

## 2012-12-13 LAB — POCT HEMOGLOBIN-HEMACUE: Hemoglobin: 11.6 g/dL — ABNORMAL LOW (ref 12.0–15.0)

## 2012-12-13 MED ORDER — EPOETIN ALFA 20000 UNIT/ML IJ SOLN
INTRAMUSCULAR | Status: AC
  Filled 2012-12-13: qty 1

## 2012-12-13 MED ORDER — EPOETIN ALFA 20000 UNIT/ML IJ SOLN
20000.0000 [IU] | INTRAMUSCULAR | Status: DC
  Administered 2012-12-13: 20000 [IU] via SUBCUTANEOUS

## 2012-12-26 ENCOUNTER — Inpatient Hospital Stay (HOSPITAL_COMMUNITY): Admission: RE | Admit: 2012-12-26 | Payer: Medicare Other | Source: Ambulatory Visit

## 2012-12-31 ENCOUNTER — Encounter (HOSPITAL_COMMUNITY)
Admission: RE | Admit: 2012-12-31 | Discharge: 2012-12-31 | Disposition: A | Discharge status: Home / Self Care | Payer: Medicare Other | Source: Ambulatory Visit | Attending: Nephrology | Admitting: Nephrology

## 2012-12-31 DIAGNOSIS — N183 Chronic kidney disease, stage 3 unspecified: Secondary | ICD-10-CM | POA: Insufficient documentation

## 2012-12-31 DIAGNOSIS — D638 Anemia in other chronic diseases classified elsewhere: Secondary | ICD-10-CM | POA: Insufficient documentation

## 2012-12-31 LAB — IRON AND TIBC
Iron: 82 ug/dL (ref 42–135)
TIBC: 168 ug/dL — ABNORMAL LOW (ref 250–470)
UIBC: 86 ug/dL — ABNORMAL LOW (ref 125–400)

## 2012-12-31 MED ORDER — EPOETIN ALFA 20000 UNIT/ML IJ SOLN
20000.0000 [IU] | INTRAMUSCULAR | Status: DC
  Administered 2012-12-31: 20000 [IU] via SUBCUTANEOUS

## 2012-12-31 MED ORDER — EPOETIN ALFA 20000 UNIT/ML IJ SOLN
INTRAMUSCULAR | Status: AC
  Filled 2012-12-31: qty 1

## 2013-01-06 ENCOUNTER — Encounter (HOSPITAL_COMMUNITY): Payer: Medicare Other

## 2013-01-07 ENCOUNTER — Encounter (HOSPITAL_COMMUNITY): Payer: Medicare Other

## 2013-01-14 ENCOUNTER — Encounter (HOSPITAL_COMMUNITY)
Admission: RE | Admit: 2013-01-14 | Discharge: 2013-01-14 | Disposition: A | Discharge status: Home / Self Care | Payer: Medicare Other | Source: Ambulatory Visit | Attending: Nephrology | Admitting: Nephrology

## 2013-01-14 DIAGNOSIS — N183 Chronic kidney disease, stage 3 unspecified: Secondary | ICD-10-CM | POA: Insufficient documentation

## 2013-01-14 DIAGNOSIS — D638 Anemia in other chronic diseases classified elsewhere: Secondary | ICD-10-CM | POA: Insufficient documentation

## 2013-01-14 MED ORDER — EPOETIN ALFA 20000 UNIT/ML IJ SOLN
20000.0000 [IU] | INTRAMUSCULAR | Status: DC
  Administered 2013-01-14: 20000 [IU] via SUBCUTANEOUS

## 2013-01-14 MED ORDER — EPOETIN ALFA 20000 UNIT/ML IJ SOLN
INTRAMUSCULAR | Status: AC
  Filled 2013-01-14: qty 1

## 2013-01-28 ENCOUNTER — Encounter (HOSPITAL_COMMUNITY): Payer: Medicare Other

## 2013-02-05 ENCOUNTER — Encounter (HOSPITAL_COMMUNITY): Payer: Medicare Other

## 2013-02-06 ENCOUNTER — Encounter (HOSPITAL_COMMUNITY)
Admission: RE | Admit: 2013-02-06 | Discharge: 2013-02-06 | Disposition: A | Discharge status: Home / Self Care | Payer: Medicare Other | Source: Ambulatory Visit | Attending: Nephrology | Admitting: Nephrology

## 2013-02-06 LAB — IRON AND TIBC: Saturation Ratios: 38 % (ref 20–55)

## 2013-02-06 MED ORDER — EPOETIN ALFA 20000 UNIT/ML IJ SOLN
INTRAMUSCULAR | Status: AC
  Filled 2013-02-06: qty 1

## 2013-02-06 MED ORDER — EPOETIN ALFA 20000 UNIT/ML IJ SOLN
20000.0000 [IU] | INTRAMUSCULAR | Status: DC
  Administered 2013-02-06: 20000 [IU] via SUBCUTANEOUS

## 2013-02-18 ENCOUNTER — Other Ambulatory Visit (HOSPITAL_COMMUNITY): Payer: Self-pay | Admitting: *Deleted

## 2013-02-19 ENCOUNTER — Ambulatory Visit: Payer: Medicare Other | Admitting: Dietician

## 2013-02-20 ENCOUNTER — Encounter (HOSPITAL_COMMUNITY)
Admission: RE | Admit: 2013-02-20 | Discharge: 2013-02-20 | Disposition: A | Discharge status: Home / Self Care | Payer: Medicare Other | Source: Ambulatory Visit | Attending: Nephrology | Admitting: Nephrology

## 2013-02-20 DIAGNOSIS — D638 Anemia in other chronic diseases classified elsewhere: Secondary | ICD-10-CM | POA: Insufficient documentation

## 2013-02-20 DIAGNOSIS — N183 Chronic kidney disease, stage 3 unspecified: Secondary | ICD-10-CM | POA: Insufficient documentation

## 2013-02-20 LAB — POCT HEMOGLOBIN-HEMACUE: Hemoglobin: 10.3 g/dL — ABNORMAL LOW (ref 12.0–15.0)

## 2013-02-20 MED ORDER — EPOETIN ALFA 20000 UNIT/ML IJ SOLN
INTRAMUSCULAR | Status: AC
  Filled 2013-02-20: qty 1

## 2013-02-20 MED ORDER — EPOETIN ALFA 20000 UNIT/ML IJ SOLN
20000.0000 [IU] | INTRAMUSCULAR | Status: DC
  Administered 2013-02-20: 20000 [IU] via SUBCUTANEOUS

## 2013-02-20 MED ORDER — EPOETIN ALFA 10000 UNIT/ML IJ SOLN
INTRAMUSCULAR | Status: AC
  Filled 2013-02-20: qty 1

## 2013-03-04 ENCOUNTER — Ambulatory Visit: Payer: Medicare Other | Admitting: Neurosurgery

## 2013-03-06 ENCOUNTER — Encounter (HOSPITAL_COMMUNITY)
Admission: RE | Admit: 2013-03-06 | Discharge: 2013-03-06 | Disposition: A | Discharge status: Home / Self Care | Payer: Medicare Other | Source: Ambulatory Visit | Attending: Nephrology | Admitting: Nephrology

## 2013-03-06 LAB — IRON AND TIBC
Iron: 63 ug/dL (ref 42–135)
Saturation Ratios: 35 % (ref 20–55)
TIBC: 178 ug/dL — ABNORMAL LOW (ref 250–470)
UIBC: 115 ug/dL — ABNORMAL LOW (ref 125–400)

## 2013-03-06 LAB — POCT HEMOGLOBIN-HEMACUE: Hemoglobin: 10.3 g/dL — ABNORMAL LOW (ref 12.0–15.0)

## 2013-03-06 MED ORDER — EPOETIN ALFA 20000 UNIT/ML IJ SOLN
INTRAMUSCULAR | Status: AC
  Administered 2013-03-06: 20000 [IU] via SUBCUTANEOUS
  Filled 2013-03-06: qty 1

## 2013-03-06 MED ORDER — EPOETIN ALFA 20000 UNIT/ML IJ SOLN: 20000.0000 [IU] | INTRAMUSCULAR | Status: DC

## 2013-03-20 ENCOUNTER — Encounter (HOSPITAL_COMMUNITY)
Admission: RE | Admit: 2013-03-20 | Discharge: 2013-03-20 | Disposition: A | Discharge status: Home / Self Care | Payer: Medicare Other | Source: Ambulatory Visit | Attending: Nephrology | Admitting: Nephrology

## 2013-03-20 DIAGNOSIS — N183 Chronic kidney disease, stage 3 unspecified: Secondary | ICD-10-CM | POA: Insufficient documentation

## 2013-03-20 DIAGNOSIS — D638 Anemia in other chronic diseases classified elsewhere: Secondary | ICD-10-CM | POA: Insufficient documentation

## 2013-03-20 LAB — POCT HEMOGLOBIN-HEMACUE: Hemoglobin: 10.7 g/dL — ABNORMAL LOW (ref 12.0–15.0)

## 2013-03-20 MED ORDER — EPOETIN ALFA 20000 UNIT/ML IJ SOLN
INTRAMUSCULAR | Status: AC
  Filled 2013-03-20: qty 1

## 2013-03-20 MED ORDER — EPOETIN ALFA 20000 UNIT/ML IJ SOLN
20000.0000 [IU] | INTRAMUSCULAR | Status: DC
  Administered 2013-03-20: 20000 [IU] via SUBCUTANEOUS

## 2013-04-03 ENCOUNTER — Encounter (HOSPITAL_COMMUNITY)
Admission: RE | Admit: 2013-04-03 | Discharge: 2013-04-03 | Disposition: A | Discharge status: Home / Self Care | Payer: Medicare Other | Source: Ambulatory Visit | Attending: Nephrology | Admitting: Nephrology

## 2013-04-03 LAB — FERRITIN: Ferritin: 433 ng/mL — ABNORMAL HIGH (ref 10–291)

## 2013-04-03 LAB — IRON AND TIBC: Iron: 102 ug/dL (ref 42–135)

## 2013-04-03 MED ORDER — EPOETIN ALFA 20000 UNIT/ML IJ SOLN: 20000.0000 [IU] | INTRAMUSCULAR | Status: DC

## 2013-04-03 MED ORDER — EPOETIN ALFA 20000 UNIT/ML IJ SOLN
INTRAMUSCULAR | Status: AC
  Administered 2013-04-03: 20000 [IU] via SUBCUTANEOUS
  Filled 2013-04-03: qty 1

## 2013-04-17 ENCOUNTER — Encounter (HOSPITAL_COMMUNITY)
Admission: RE | Admit: 2013-04-17 | Discharge: 2013-04-17 | Disposition: A | Discharge status: Home / Self Care | Payer: Medicare Other | Source: Ambulatory Visit | Attending: Nephrology | Admitting: Nephrology

## 2013-04-17 DIAGNOSIS — N183 Chronic kidney disease, stage 3 unspecified: Secondary | ICD-10-CM | POA: Insufficient documentation

## 2013-04-17 DIAGNOSIS — D638 Anemia in other chronic diseases classified elsewhere: Secondary | ICD-10-CM | POA: Insufficient documentation

## 2013-04-17 MED ORDER — EPOETIN ALFA 20000 UNIT/ML IJ SOLN
20000.0000 [IU] | INTRAMUSCULAR | Status: DC
  Administered 2013-04-17: 20000 [IU] via SUBCUTANEOUS

## 2013-04-17 MED ORDER — EPOETIN ALFA 20000 UNIT/ML IJ SOLN
INTRAMUSCULAR | Status: AC
  Filled 2013-04-17: qty 1

## 2013-04-25 ENCOUNTER — Encounter: Payer: Self-pay | Admitting: Surgery

## 2013-04-28 ENCOUNTER — Encounter (INDEPENDENT_AMBULATORY_CARE_PROVIDER_SITE_OTHER): Payer: Medicare Other | Admitting: *Deleted

## 2013-04-28 ENCOUNTER — Encounter: Payer: Self-pay | Admitting: Surgery

## 2013-04-28 ENCOUNTER — Ambulatory Visit (INDEPENDENT_AMBULATORY_CARE_PROVIDER_SITE_OTHER): Payer: Medicare Other | Admitting: Surgery

## 2013-04-28 VITALS — BP 161/63 | HR 72 | Resp 16 | Ht 60.0 in | Wt 175.0 lb

## 2013-04-28 DIAGNOSIS — I739 Peripheral vascular disease, unspecified: Secondary | ICD-10-CM

## 2013-04-28 DIAGNOSIS — R209 Unspecified disturbances of skin sensation: Secondary | ICD-10-CM

## 2013-04-28 DIAGNOSIS — R200 Anesthesia of skin: Secondary | ICD-10-CM

## 2013-04-28 DIAGNOSIS — Z48812 Encounter for surgical aftercare following surgery on the circulatory system: Secondary | ICD-10-CM

## 2013-04-28 NOTE — Progress Notes (Signed)
[facility]   Patient name: [name] MRN: [id number] DOB: [birth year] Sex: female     Chief Complaint  Patient presents with  . PVD    One year F/up with vascular Lab study.  C/O Left heel numbness, duration 1 mo off/on.    HISTORY OF PRESENT ILLNESS: The patient is back today for followup. She has a history of bilateral lower extremity bypass grafts done in the remote past. She developed a wound on her right leg. She was deemed to be too high risk by cardiology to proceed with revascularization and therefore she ultimately progressed to the point where she needed a right above-knee amputation. She has done very well from that perspective. PERRLA complaint today is that of swelling in her left leg. She uses a wheelchair to get around.  Past Medical History  Diagnosis Date  . DM (diabetes mellitus) type II controlled peripheral vascular disorder     A1C = 5.8  . HTN (hypertension)   . CHF (congestive heart failure)     EF = 50-55%, grade 2 diastolic dysfunction  . CKD (chronic kidney disease)   . CAD (coronary artery disease)   . COPD (chronic obstructive pulmonary disease)   . Cataracts, bilateral   . Hyperlipidemia   . Peripheral vascular disease bilateral    S/P femoral-popliteal bypass surgery  . Glaucoma   . TIA (transient ischemic attack)     Past Surgical History  Procedure Laterality Date  . Coronary artery bypass graft    . Spine surgery    . Eye surgery  cataract  . Carpal tunnel release  left  . Breast surgery  lumpectomy right  . Aortogram w/ runoff  06/06/11    right leg  . Above knee leg amputation  07/27/11    Right AKA    History   Social History  . Marital Status: Widowed    Spouse Name: N/A    Number of Children: N/A  . Years of Education: N/A   Occupational History  . Not on file.   Social History Main Topics  . Smoking status: Former Smoker -- 1.00 packs/day for 40 years    Types: Cigarettes    Quit date:  04/13/1977  . Smokeless tobacco: Never Used  . Alcohol Use: No  . Drug Use: No  . Sexually Active: No   Other Topics Concern  . Not on file   Social History Narrative  . No narrative on file    Family History  Problem Relation Age of Onset  . Hypertension Mother   . Peripheral vascular disease Mother   . Diabetes Father   . Kidney disease Sister   . Cancer Brother     lung  . Lung disease Sister   . Lung disease Sister   . Kidney disease Daughter     On dialysis, died in Apr 02, 2006    Allergies as of 04/28/2013 - Review Complete 04/28/2013  Allergen Reaction Noted  . Codeine  06/19/2011  . Omnipaque (iohexol)  06/19/2011  . Celebrex (celecoxib) Rash 05/31/2011  . Iodine Rash 05/31/2011    Current Outpatient Prescriptions on File Prior to Visit  Medication Sig Dispense Refill  . allopurinol (ZYLOPRIM) 100 MG tablet Take 1 tablet (100 mg total) by mouth every evening.  30 tablet  1  . amLODipine (NORVASC) 5 MG tablet Take 10 mg by mouth daily.       Marland Kitchen aspirin EC 81 MG tablet Take 81 mg by  mouth daily.      Marland Kitchen b complex-vitamin c-folic acid (NEPHRO-VITE) 0.8 MG TABS Take 0.8 mg by mouth at bedtime.        . carvedilol (COREG) 6.25 MG tablet Take 1 tablet (6.25 mg total) by mouth 2 (two) times daily with a meal.  60 tablet  1  . citalopram (CELEXA) 10 MG tablet Take 20 mg by mouth daily.       . cloNIDine (CATAPRES) 0.2 MG tablet Take 1 tablet (0.2 mg total) by mouth 2 (two) times daily.  60 tablet  1  . clotrimazole (LOTRIMIN) 1 % external solution Apply 1 application topically as needed. Apply to fungal rash on foot      . Diphenhyd-Hydrocort-Nystatin SUSP Use as directed 15 mLs in the mouth or throat 2 (two) times daily.  100 mL  0  . Diphenhyd-Hydrocort-Nystatin SUSP Use as directed 5 mLs in the mouth or throat 2 (two) times daily.  100 mL  0  . ezetimibe (ZETIA) 10 MG tablet Take 10 mg by mouth daily.        . famotidine (PEPCID) 20 MG tablet Take 20 mg by mouth daily.        . Filgrastim (NEUPOGEN IJ) Inject as directed 2 (two) times a week. Twice a month.      . furosemide (LASIX) 40 MG tablet Take 20 mg by mouth 2 (two) times daily.      Marland Kitchen glimepiride (AMARYL) 2 MG tablet Take 2 mg by mouth daily before breakfast.        . glucosamine-chondroitin 500-400 MG tablet Take 3 tablets by mouth 3 (three) times daily.       . hydrALAZINE (APRESOLINE) 50 MG tablet Take 75 mg by mouth 3 (three) times daily.       . isosorbide dinitrate (ISORDIL) 40 MG tablet Take 40 mg by mouth 2 (two) times daily.       Marland Kitchen loratadine (CLARITIN) 10 MG tablet Take 10 mg by mouth daily as needed. For allergies.      . mupirocin (BACTROBAN) 2 % ointment Apply 1 application topically 2 (two) times daily as needed. For skin breakouts.      . polyethylene glycol (MIRALAX / GLYCOLAX) packet Take 17 g by mouth daily as needed. For constipation.      . Polysaccharide Iron Complex (FERREX 150 PO) Take 1 tablet by mouth 2 (two) times daily.       . potassium chloride (K-DUR) 10 MEQ tablet Take 2 tablets (20 mEq total) by mouth daily.  30 tablet  1  . sennosides-docusate sodium (SENOKOT-S) 8.6-50 MG tablet Take 2 tablets by mouth daily.       . timolol (TIMOPTIC-XR) 0.25 % ophthalmic gel-forming Place 1 drop into both eyes daily.       Marland Kitchen triamcinolone (KENALOG) 0.1 % cream Apply 1 application topically 2 (two) times daily as needed. For skin breakouts.      . vancomycin (VANCOCIN) 50 mg/mL oral solution Take 2.5 mLs (125 mg total) by mouth 4 (four) times daily.  100 mL  0  . HYDROcodone-acetaminophen (NORCO) 5-325 MG per tablet Take 1-2 tablets by mouth every 6 (six) hours as needed. For pain      . oxycodone (OXY-IR) 5 MG capsule Take 5 mg by mouth every 4 (four) hours as needed. For pain.       No current facility-administered medications on file prior to visit.     REVIEW OF SYSTEMS: Positive for a history  of phlebitis, numbness in her left heel, swelling. All other systems are negative as  documented by the patient in the encounter form.  PHYSICAL EXAMINATION:   Vital signs are BP 161/63  Pulse 72  Resp 16  Ht 5' (1.524 m)  Wt 175 lb (79.379 kg)  BMI 34.18 kg/m2  SpO2 97% General: The patient appears their stated age. HEENT:  No gross abnormalities Pulmonary:  Non labored breathing Musculoskeletal: Well healed right above-knee amputation Neurologic: No focal weakness or paresthesias are detected, Skin: The patient complains about the left heel, however there is no skin breakdown.  Psychiatric: The patient has normal affect. Cardiovascular: There is a regular rate and rhythm without significant murmur appreciated. Left leg 1+ edema.   Diagnostic Studies Doppler studies were performed today. The left femoral-popliteal bypass graft is widely patent. ABIs are 0.71. This is no significant interval change.  Assessment: Vascular disease bilateral Plan: The patient has done very well over the course of the past year. I have recommended continued surveillance. She will need to monitor her left heel I do not see any evidence of a problem at this time. I have encouraged her to place lotion on this area to keep the skin from getting to dry. She'll contact me if she develops ulceration. Otherwise, I'll see her back in one year for followup with a duplex ultrasound and ABI.  Jorge Ny, M.D. [facility] [place] Office: [phone] Pager:  [phone]

## 2013-04-29 NOTE — Addendum Note (Signed)
Addended by: Adria Dill L on: 04/29/2013 04:09 PM   Modules accepted: Orders

## 2013-04-30 ENCOUNTER — Other Ambulatory Visit: Payer: Self-pay | Admitting: *Deleted

## 2013-05-01 ENCOUNTER — Encounter (HOSPITAL_COMMUNITY): Payer: Medicare Other

## 2013-05-05 ENCOUNTER — Encounter (HOSPITAL_COMMUNITY)
Admission: RE | Admit: 2013-05-05 | Discharge: 2013-05-05 | Disposition: A | Discharge status: Home / Self Care | Payer: Medicare Other | Source: Ambulatory Visit | Attending: Nephrology | Admitting: Nephrology

## 2013-05-05 LAB — IRON AND TIBC
Saturation Ratios: 49 % (ref 20–55)
UIBC: 86 ug/dL — ABNORMAL LOW (ref 125–400)

## 2013-05-05 MED ORDER — EPOETIN ALFA 20000 UNIT/ML IJ SOLN
INTRAMUSCULAR | Status: AC
  Filled 2013-05-05: qty 1

## 2013-05-05 MED ORDER — EPOETIN ALFA 20000 UNIT/ML IJ SOLN
20000.0000 [IU] | INTRAMUSCULAR | Status: DC
  Administered 2013-05-05: 20000 [IU] via SUBCUTANEOUS

## 2013-05-15 ENCOUNTER — Other Ambulatory Visit (HOSPITAL_COMMUNITY): Payer: Self-pay | Admitting: *Deleted

## 2013-05-19 ENCOUNTER — Encounter (HOSPITAL_COMMUNITY)
Admission: RE | Admit: 2013-05-19 | Discharge: 2013-05-19 | Disposition: A | Discharge status: Home / Self Care | Payer: Medicare Other | Source: Ambulatory Visit | Attending: Nephrology | Admitting: Nephrology

## 2013-05-19 DIAGNOSIS — D638 Anemia in other chronic diseases classified elsewhere: Secondary | ICD-10-CM | POA: Insufficient documentation

## 2013-05-19 DIAGNOSIS — N183 Chronic kidney disease, stage 3 unspecified: Secondary | ICD-10-CM | POA: Insufficient documentation

## 2013-05-19 LAB — POCT HEMOGLOBIN-HEMACUE: Hemoglobin: 11.3 g/dL — ABNORMAL LOW (ref 12.0–15.0)

## 2013-05-19 MED ORDER — EPOETIN ALFA 20000 UNIT/ML IJ SOLN
INTRAMUSCULAR | Status: AC
  Administered 2013-05-19: 20000 [IU] via SUBCUTANEOUS
  Filled 2013-05-19: qty 1

## 2013-05-19 MED ORDER — EPOETIN ALFA 20000 UNIT/ML IJ SOLN: 20000.0000 [IU] | INTRAMUSCULAR | Status: DC

## 2013-06-02 ENCOUNTER — Encounter (HOSPITAL_COMMUNITY)
Admission: RE | Admit: 2013-06-02 | Discharge: 2013-06-02 | Disposition: A | Discharge status: Home / Self Care | Payer: Medicare Other | Source: Ambulatory Visit | Attending: Nephrology | Admitting: Nephrology

## 2013-06-02 LAB — FERRITIN: Ferritin: 340 ng/mL — ABNORMAL HIGH (ref 10–291)

## 2013-06-02 LAB — IRON AND TIBC: UIBC: 134 ug/dL (ref 125–400)

## 2013-06-02 MED ORDER — EPOETIN ALFA 20000 UNIT/ML IJ SOLN: 20000.0000 [IU] | INTRAMUSCULAR | Status: DC

## 2013-06-02 MED ORDER — EPOETIN ALFA 20000 UNIT/ML IJ SOLN
INTRAMUSCULAR | Status: AC
  Administered 2013-06-02: 20000 [IU] via SUBCUTANEOUS
  Filled 2013-06-02: qty 1

## 2013-06-12 ENCOUNTER — Other Ambulatory Visit: Payer: Self-pay

## 2013-06-12 DIAGNOSIS — Z1231 Encounter for screening mammogram for malignant neoplasm of breast: Secondary | ICD-10-CM

## 2013-06-19 ENCOUNTER — Encounter (HOSPITAL_COMMUNITY)
Admission: RE | Admit: 2013-06-19 | Discharge: 2013-06-19 | Disposition: A | Discharge status: Home / Self Care | Payer: Medicare Other | Source: Ambulatory Visit | Attending: Nephrology | Admitting: Nephrology

## 2013-06-19 DIAGNOSIS — D638 Anemia in other chronic diseases classified elsewhere: Secondary | ICD-10-CM | POA: Insufficient documentation

## 2013-06-19 DIAGNOSIS — N183 Chronic kidney disease, stage 3 unspecified: Secondary | ICD-10-CM | POA: Insufficient documentation

## 2013-06-19 MED ORDER — EPOETIN ALFA 20000 UNIT/ML IJ SOLN: 20000.0000 [IU] | INTRAMUSCULAR | Status: DC

## 2013-06-19 MED ORDER — EPOETIN ALFA 20000 UNIT/ML IJ SOLN
INTRAMUSCULAR | Status: AC
  Administered 2013-06-19: 20000 [IU] via SUBCUTANEOUS
  Filled 2013-06-19: qty 1

## 2013-07-01 ENCOUNTER — Ambulatory Visit: Payer: Medicare Other

## 2013-07-03 ENCOUNTER — Encounter (HOSPITAL_COMMUNITY)
Admission: RE | Admit: 2013-07-03 | Discharge: 2013-07-03 | Disposition: A | Discharge status: Home / Self Care | Payer: Medicare Other | Source: Ambulatory Visit | Attending: Nephrology | Admitting: Nephrology

## 2013-07-03 LAB — IRON AND TIBC
Saturation Ratios: 39 % (ref 20–55)
TIBC: 171 ug/dL — ABNORMAL LOW (ref 250–470)

## 2013-07-03 LAB — POCT HEMOGLOBIN-HEMACUE: Hemoglobin: 10.5 g/dL — ABNORMAL LOW (ref 12.0–15.0)

## 2013-07-03 MED ORDER — EPOETIN ALFA 20000 UNIT/ML IJ SOLN
20000.0000 [IU] | INTRAMUSCULAR | Status: DC
  Administered 2013-07-03: 20000 [IU] via SUBCUTANEOUS

## 2013-07-03 MED ORDER — EPOETIN ALFA 20000 UNIT/ML IJ SOLN
INTRAMUSCULAR | Status: AC
  Filled 2013-07-03: qty 1

## 2013-07-04 LAB — FERRITIN: Ferritin: 231 ng/mL (ref 10–291)

## 2013-07-18 ENCOUNTER — Encounter (HOSPITAL_COMMUNITY)
Admission: RE | Admit: 2013-07-18 | Discharge: 2013-07-18 | Disposition: A | Discharge status: Home / Self Care | Payer: Medicare Other | Source: Ambulatory Visit | Attending: Nephrology | Admitting: Nephrology

## 2013-07-18 DIAGNOSIS — D638 Anemia in other chronic diseases classified elsewhere: Secondary | ICD-10-CM | POA: Insufficient documentation

## 2013-07-18 DIAGNOSIS — N183 Chronic kidney disease, stage 3 unspecified: Secondary | ICD-10-CM | POA: Insufficient documentation

## 2013-07-18 LAB — POCT HEMOGLOBIN-HEMACUE: Hemoglobin: 11.4 g/dL — ABNORMAL LOW (ref 12.0–15.0)

## 2013-07-18 MED ORDER — EPOETIN ALFA 20000 UNIT/ML IJ SOLN
INTRAMUSCULAR | Status: AC
  Filled 2013-07-18: qty 1

## 2013-07-18 MED ORDER — EPOETIN ALFA 20000 UNIT/ML IJ SOLN
20000.0000 [IU] | INTRAMUSCULAR | Status: DC
  Administered 2013-07-18: 20000 [IU] via SUBCUTANEOUS

## 2013-08-01 ENCOUNTER — Encounter (HOSPITAL_COMMUNITY)
Admission: RE | Admit: 2013-08-01 | Discharge: 2013-08-01 | Disposition: A | Discharge status: Home / Self Care | Payer: Medicare Other | Source: Ambulatory Visit | Attending: Nephrology | Admitting: Nephrology

## 2013-08-01 LAB — POCT HEMOGLOBIN-HEMACUE: Hemoglobin: 10.3 g/dL — ABNORMAL LOW (ref 12.0–15.0)

## 2013-08-01 LAB — IRON AND TIBC: TIBC: 184 ug/dL — ABNORMAL LOW (ref 250–470)

## 2013-08-01 MED ORDER — EPOETIN ALFA 20000 UNIT/ML IJ SOLN
20000.0000 [IU] | INTRAMUSCULAR | Status: DC
  Administered 2013-08-01: 20000 [IU] via SUBCUTANEOUS

## 2013-08-11 ENCOUNTER — Other Ambulatory Visit (HOSPITAL_COMMUNITY): Payer: Self-pay | Admitting: *Deleted

## 2013-08-14 ENCOUNTER — Encounter (HOSPITAL_COMMUNITY)
Admission: RE | Admit: 2013-08-14 | Discharge: 2013-08-14 | Disposition: A | Discharge status: Home / Self Care | Payer: Medicare Other | Source: Ambulatory Visit | Attending: Nephrology | Admitting: Nephrology

## 2013-08-14 DIAGNOSIS — D638 Anemia in other chronic diseases classified elsewhere: Secondary | ICD-10-CM | POA: Insufficient documentation

## 2013-08-14 DIAGNOSIS — N183 Chronic kidney disease, stage 3 unspecified: Secondary | ICD-10-CM | POA: Insufficient documentation

## 2013-08-14 MED ORDER — EPOETIN ALFA 20000 UNIT/ML IJ SOLN
20000.0000 [IU] | INTRAMUSCULAR | Status: DC
  Administered 2013-08-14: 20000 [IU] via SUBCUTANEOUS

## 2013-08-14 MED ORDER — EPOETIN ALFA 20000 UNIT/ML IJ SOLN
INTRAMUSCULAR | Status: AC
  Administered 2013-08-14: 20000 [IU] via SUBCUTANEOUS
  Filled 2013-08-14: qty 1

## 2013-08-29 ENCOUNTER — Encounter (HOSPITAL_COMMUNITY)
Admission: RE | Admit: 2013-08-29 | Discharge: 2013-08-29 | Disposition: A | Discharge status: Home / Self Care | Payer: Medicare Other | Source: Ambulatory Visit | Attending: Nephrology | Admitting: Nephrology

## 2013-08-29 LAB — FERRITIN: Ferritin: 324 ng/mL — ABNORMAL HIGH (ref 10–291)

## 2013-08-29 LAB — IRON AND TIBC
Iron: 77 ug/dL (ref 42–135)
Saturation Ratios: 43 % (ref 20–55)
TIBC: 180 ug/dL — ABNORMAL LOW (ref 250–470)

## 2013-08-29 LAB — POCT HEMOGLOBIN-HEMACUE: Hemoglobin: 10.6 g/dL — ABNORMAL LOW (ref 12.0–15.0)

## 2013-08-29 MED ORDER — EPOETIN ALFA 20000 UNIT/ML IJ SOLN
INTRAMUSCULAR | Status: AC
  Filled 2013-08-29: qty 1

## 2013-08-29 MED ORDER — EPOETIN ALFA 20000 UNIT/ML IJ SOLN
20000.0000 [IU] | INTRAMUSCULAR | Status: DC
  Administered 2013-08-29: 11:00:00 20000 [IU] via SUBCUTANEOUS

## 2013-09-10 ENCOUNTER — Ambulatory Visit: Payer: Self-pay | Admitting: Podiatry

## 2013-09-11 ENCOUNTER — Inpatient Hospital Stay (HOSPITAL_COMMUNITY): Admission: RE | Admit: 2013-09-11 | Payer: Medicare Other | Source: Ambulatory Visit

## 2013-09-25 ENCOUNTER — Encounter (HOSPITAL_COMMUNITY)
Admission: RE | Admit: 2013-09-25 | Discharge: 2013-09-25 | Disposition: A | Discharge status: Home / Self Care | Payer: Medicare Other | Source: Ambulatory Visit | Attending: Nephrology | Admitting: Nephrology

## 2013-09-25 DIAGNOSIS — D638 Anemia in other chronic diseases classified elsewhere: Secondary | ICD-10-CM | POA: Insufficient documentation

## 2013-09-25 DIAGNOSIS — N183 Chronic kidney disease, stage 3 unspecified: Secondary | ICD-10-CM | POA: Insufficient documentation

## 2013-09-25 LAB — IRON AND TIBC
TIBC: 154 ug/dL — ABNORMAL LOW (ref 250–470)
UIBC: 106 ug/dL — ABNORMAL LOW (ref 125–400)

## 2013-09-25 LAB — POCT HEMOGLOBIN-HEMACUE: Hemoglobin: 9.1 g/dL — ABNORMAL LOW (ref 12.0–15.0)

## 2013-09-25 MED ORDER — EPOETIN ALFA 20000 UNIT/ML IJ SOLN
20000.0000 [IU] | INTRAMUSCULAR | Status: DC
  Administered 2013-09-25: 20000 [IU] via SUBCUTANEOUS

## 2013-09-25 MED ORDER — EPOETIN ALFA 20000 UNIT/ML IJ SOLN
INTRAMUSCULAR | Status: AC
  Filled 2013-09-25: qty 1

## 2013-10-10 ENCOUNTER — Encounter (HOSPITAL_COMMUNITY)
Admission: RE | Admit: 2013-10-10 | Discharge: 2013-10-10 | Disposition: A | Discharge status: Home / Self Care | Payer: Medicare Other | Source: Ambulatory Visit | Attending: Nephrology | Admitting: Nephrology

## 2013-10-10 LAB — POCT HEMOGLOBIN-HEMACUE: Hemoglobin: 9.3 g/dL — ABNORMAL LOW (ref 12.0–15.0)

## 2013-10-10 MED ORDER — EPOETIN ALFA 20000 UNIT/ML IJ SOLN
INTRAMUSCULAR | Status: AC
  Filled 2013-10-10: qty 1

## 2013-10-10 MED ORDER — EPOETIN ALFA 20000 UNIT/ML IJ SOLN
20000.0000 [IU] | INTRAMUSCULAR | Status: DC
  Administered 2013-10-10: 20000 [IU] via SUBCUTANEOUS

## 2013-10-24 ENCOUNTER — Encounter (HOSPITAL_COMMUNITY)
Admission: RE | Admit: 2013-10-24 | Discharge: 2013-10-24 | Disposition: A | Discharge status: Home / Self Care | Payer: Medicare Other | Source: Ambulatory Visit | Attending: Nephrology | Admitting: Nephrology

## 2013-10-24 DIAGNOSIS — N183 Chronic kidney disease, stage 3 unspecified: Secondary | ICD-10-CM | POA: Insufficient documentation

## 2013-10-24 DIAGNOSIS — D638 Anemia in other chronic diseases classified elsewhere: Secondary | ICD-10-CM | POA: Insufficient documentation

## 2013-10-24 LAB — IRON AND TIBC
Iron: 38 ug/dL — ABNORMAL LOW (ref 42–135)
TIBC: 164 ug/dL — ABNORMAL LOW (ref 250–470)

## 2013-10-24 MED ORDER — EPOETIN ALFA 20000 UNIT/ML IJ SOLN
INTRAMUSCULAR | Status: AC
  Filled 2013-10-24: qty 1

## 2013-10-24 MED ORDER — EPOETIN ALFA 20000 UNIT/ML IJ SOLN
20000.0000 [IU] | INTRAMUSCULAR | Status: DC
  Administered 2013-10-24: 11:00:00 20000 [IU] via SUBCUTANEOUS

## 2013-11-10 ENCOUNTER — Encounter (HOSPITAL_COMMUNITY)
Admission: RE | Admit: 2013-11-10 | Discharge: 2013-11-10 | Disposition: A | Discharge status: Home / Self Care | Payer: Medicare Other | Source: Ambulatory Visit | Attending: Nephrology | Admitting: Nephrology

## 2013-11-10 MED ORDER — EPOETIN ALFA 20000 UNIT/ML IJ SOLN
20000.0000 [IU] | INTRAMUSCULAR | Status: DC
  Administered 2013-11-10: 10:00:00 20000 [IU] via SUBCUTANEOUS

## 2013-11-10 MED ORDER — EPOETIN ALFA 20000 UNIT/ML IJ SOLN
INTRAMUSCULAR | Status: AC
  Filled 2013-11-10: qty 1

## 2013-11-21 ENCOUNTER — Other Ambulatory Visit (HOSPITAL_COMMUNITY): Payer: Self-pay

## 2013-11-24 ENCOUNTER — Encounter (HOSPITAL_COMMUNITY)
Admission: RE | Admit: 2013-11-24 | Discharge: 2013-11-24 | Disposition: A | Discharge status: Home / Self Care | Payer: Medicare Other | Source: Ambulatory Visit | Attending: Nephrology | Admitting: Nephrology

## 2013-11-24 DIAGNOSIS — N183 Chronic kidney disease, stage 3 unspecified: Secondary | ICD-10-CM | POA: Insufficient documentation

## 2013-11-24 DIAGNOSIS — D638 Anemia in other chronic diseases classified elsewhere: Secondary | ICD-10-CM | POA: Insufficient documentation

## 2013-11-24 LAB — POCT HEMOGLOBIN-HEMACUE: HEMOGLOBIN: 9.6 g/dL — AB (ref 12.0–15.0)

## 2013-11-24 LAB — IRON AND TIBC
Iron: 51 ug/dL (ref 42–135)
Saturation Ratios: 30 % (ref 20–55)
TIBC: 171 ug/dL — ABNORMAL LOW (ref 250–470)
UIBC: 120 ug/dL — ABNORMAL LOW (ref 125–400)

## 2013-11-24 LAB — FERRITIN: Ferritin: 385 ng/mL — ABNORMAL HIGH (ref 10–291)

## 2013-11-24 MED ORDER — EPOETIN ALFA 20000 UNIT/ML IJ SOLN
20000.0000 [IU] | INTRAMUSCULAR | Status: DC
  Administered 2013-11-24: 20000 [IU] via SUBCUTANEOUS

## 2013-11-24 MED ORDER — EPOETIN ALFA 20000 UNIT/ML IJ SOLN
INTRAMUSCULAR | Status: AC
  Filled 2013-11-24: qty 1

## 2013-12-08 ENCOUNTER — Encounter (HOSPITAL_COMMUNITY)
Admission: RE | Admit: 2013-12-08 | Discharge: 2013-12-08 | Disposition: A | Discharge status: Home / Self Care | Payer: Medicare Other | Source: Ambulatory Visit | Attending: Nephrology | Admitting: Nephrology

## 2013-12-08 LAB — POCT HEMOGLOBIN-HEMACUE: Hemoglobin: 9.8 g/dL — ABNORMAL LOW (ref 12.0–15.0)

## 2013-12-08 MED ORDER — EPOETIN ALFA 20000 UNIT/ML IJ SOLN
20000.0000 [IU] | INTRAMUSCULAR | Status: DC
  Administered 2013-12-08: 20000 [IU] via SUBCUTANEOUS

## 2013-12-08 MED ORDER — EPOETIN ALFA 20000 UNIT/ML IJ SOLN
INTRAMUSCULAR | Status: AC
  Filled 2013-12-08: qty 1

## 2013-12-22 ENCOUNTER — Encounter (HOSPITAL_COMMUNITY)
Admission: RE | Admit: 2013-12-22 | Discharge: 2013-12-22 | Disposition: A | Discharge status: Home / Self Care | Payer: Medicare Other | Source: Ambulatory Visit | Attending: Nephrology | Admitting: Nephrology

## 2013-12-22 DIAGNOSIS — D638 Anemia in other chronic diseases classified elsewhere: Secondary | ICD-10-CM | POA: Insufficient documentation

## 2013-12-22 DIAGNOSIS — N183 Chronic kidney disease, stage 3 unspecified: Secondary | ICD-10-CM | POA: Insufficient documentation

## 2013-12-22 LAB — IRON AND TIBC
IRON: 51 ug/dL (ref 42–135)
Saturation Ratios: 32 % (ref 20–55)
TIBC: 161 ug/dL — ABNORMAL LOW (ref 250–470)
UIBC: 110 ug/dL — ABNORMAL LOW (ref 125–400)

## 2013-12-22 LAB — POCT HEMOGLOBIN-HEMACUE: Hemoglobin: 10 g/dL — ABNORMAL LOW (ref 12.0–15.0)

## 2013-12-22 LAB — FERRITIN: Ferritin: 379 ng/mL — ABNORMAL HIGH (ref 10–291)

## 2013-12-22 MED ORDER — EPOETIN ALFA 20000 UNIT/ML IJ SOLN
INTRAMUSCULAR | Status: AC
  Administered 2013-12-22: 20000 [IU] via SUBCUTANEOUS
  Filled 2013-12-22: qty 1

## 2013-12-22 MED ORDER — EPOETIN ALFA 20000 UNIT/ML IJ SOLN
20000.0000 [IU] | INTRAMUSCULAR | Status: DC
  Administered 2013-12-22: 20000 [IU] via SUBCUTANEOUS

## 2014-01-05 ENCOUNTER — Encounter (HOSPITAL_COMMUNITY)
Admission: RE | Admit: 2014-01-05 | Discharge: 2014-01-05 | Disposition: A | Discharge status: Home / Self Care | Payer: Medicare Other | Source: Ambulatory Visit | Attending: Nephrology | Admitting: Nephrology

## 2014-01-05 LAB — POCT HEMOGLOBIN-HEMACUE: Hemoglobin: 9.6 g/dL — ABNORMAL LOW (ref 12.0–15.0)

## 2014-01-05 MED ORDER — EPOETIN ALFA 20000 UNIT/ML IJ SOLN
20000.0000 [IU] | INTRAMUSCULAR | Status: DC
  Administered 2014-01-05: 20000 [IU] via SUBCUTANEOUS

## 2014-01-05 MED ORDER — EPOETIN ALFA 20000 UNIT/ML IJ SOLN
INTRAMUSCULAR | Status: AC
  Filled 2014-01-05: qty 1

## 2014-01-19 ENCOUNTER — Encounter (HOSPITAL_COMMUNITY)
Admission: RE | Admit: 2014-01-19 | Discharge: 2014-01-19 | Disposition: A | Discharge status: Home / Self Care | Payer: Medicare Other | Source: Ambulatory Visit | Attending: Nephrology | Admitting: Nephrology

## 2014-01-19 DIAGNOSIS — N183 Chronic kidney disease, stage 3 unspecified: Secondary | ICD-10-CM | POA: Insufficient documentation

## 2014-01-19 DIAGNOSIS — D638 Anemia in other chronic diseases classified elsewhere: Secondary | ICD-10-CM | POA: Insufficient documentation

## 2014-01-19 LAB — POCT HEMOGLOBIN-HEMACUE: Hemoglobin: 10 g/dL — ABNORMAL LOW (ref 12.0–15.0)

## 2014-01-19 LAB — IRON AND TIBC
Iron: 71 ug/dL (ref 42–135)
SATURATION RATIOS: 47 % (ref 20–55)
TIBC: 150 ug/dL — AB (ref 250–470)
UIBC: 79 ug/dL — ABNORMAL LOW (ref 125–400)

## 2014-01-19 LAB — FERRITIN: Ferritin: 373 ng/mL — ABNORMAL HIGH (ref 10–291)

## 2014-01-19 MED ORDER — EPOETIN ALFA 20000 UNIT/ML IJ SOLN: 20000.0000 [IU] | INTRAMUSCULAR | Status: DC

## 2014-01-19 MED ORDER — EPOETIN ALFA 20000 UNIT/ML IJ SOLN
INTRAMUSCULAR | Status: AC
  Administered 2014-01-19: 20000 [IU] via SUBCUTANEOUS
  Filled 2014-01-19: qty 1

## 2014-02-02 ENCOUNTER — Encounter (HOSPITAL_COMMUNITY)
Admission: RE | Admit: 2014-02-02 | Discharge: 2014-02-02 | Disposition: A | Discharge status: Home / Self Care | Payer: Medicare Other | Source: Ambulatory Visit | Attending: Nephrology | Admitting: Nephrology

## 2014-02-02 LAB — POCT HEMOGLOBIN-HEMACUE: HEMOGLOBIN: 10.4 g/dL — AB (ref 12.0–15.0)

## 2014-02-02 MED ORDER — EPOETIN ALFA 20000 UNIT/ML IJ SOLN: 20000.0000 [IU] | INTRAMUSCULAR | Status: DC

## 2014-02-02 MED ORDER — EPOETIN ALFA 20000 UNIT/ML IJ SOLN
INTRAMUSCULAR | Status: AC
  Administered 2014-02-02: 20000 [IU] via SUBCUTANEOUS
  Filled 2014-02-02: qty 1

## 2014-02-13 ENCOUNTER — Other Ambulatory Visit (HOSPITAL_COMMUNITY): Payer: Self-pay | Admitting: *Deleted

## 2014-02-16 ENCOUNTER — Encounter (HOSPITAL_COMMUNITY)
Admission: RE | Admit: 2014-02-16 | Discharge: 2014-02-16 | Disposition: A | Discharge status: Home / Self Care | Payer: Medicare Other | Source: Ambulatory Visit | Attending: Nephrology | Admitting: Nephrology

## 2014-02-16 DIAGNOSIS — N183 Chronic kidney disease, stage 3 unspecified: Secondary | ICD-10-CM | POA: Insufficient documentation

## 2014-02-16 DIAGNOSIS — D638 Anemia in other chronic diseases classified elsewhere: Secondary | ICD-10-CM | POA: Insufficient documentation

## 2014-02-16 LAB — IRON AND TIBC
Iron: 52 ug/dL (ref 42–135)
SATURATION RATIOS: 29 % (ref 20–55)
TIBC: 178 ug/dL — ABNORMAL LOW (ref 250–470)
UIBC: 126 ug/dL (ref 125–400)

## 2014-02-16 LAB — FERRITIN: Ferritin: 352 ng/mL — ABNORMAL HIGH (ref 10–291)

## 2014-02-16 MED ORDER — EPOETIN ALFA 20000 UNIT/ML IJ SOLN
INTRAMUSCULAR | Status: AC
  Administered 2014-02-16: 20000 [IU] via SUBCUTANEOUS
  Filled 2014-02-16: qty 1

## 2014-02-16 MED ORDER — EPOETIN ALFA 20000 UNIT/ML IJ SOLN: 20000.0000 [IU] | INTRAMUSCULAR | Status: DC

## 2014-02-17 LAB — POCT HEMOGLOBIN-HEMACUE: Hemoglobin: 10.2 g/dL — ABNORMAL LOW (ref 12.0–15.0)

## 2014-03-02 ENCOUNTER — Encounter (HOSPITAL_COMMUNITY)
Admission: RE | Admit: 2014-03-02 | Discharge: 2014-03-02 | Disposition: A | Discharge status: Home / Self Care | Payer: Medicare Other | Source: Ambulatory Visit | Attending: Nephrology | Admitting: Nephrology

## 2014-03-02 LAB — POCT HEMOGLOBIN-HEMACUE: Hemoglobin: 10.1 g/dL — ABNORMAL LOW (ref 12.0–15.0)

## 2014-03-02 MED ORDER — EPOETIN ALFA 20000 UNIT/ML IJ SOLN: 20000.0000 [IU] | INTRAMUSCULAR | Status: DC

## 2014-03-02 MED ORDER — EPOETIN ALFA 20000 UNIT/ML IJ SOLN
INTRAMUSCULAR | Status: AC
  Administered 2014-03-02: 20000 [IU] via SUBCUTANEOUS
  Filled 2014-03-02: qty 1

## 2014-03-09 ENCOUNTER — Encounter: Payer: Self-pay | Admitting: Podiatry

## 2014-03-09 ENCOUNTER — Emergency Department (HOSPITAL_COMMUNITY): Payer: Medicare Other

## 2014-03-09 ENCOUNTER — Ambulatory Visit (INDEPENDENT_AMBULATORY_CARE_PROVIDER_SITE_OTHER): Payer: Medicare Other | Admitting: Podiatry

## 2014-03-09 ENCOUNTER — Inpatient Hospital Stay (HOSPITAL_COMMUNITY)
Admission: EM | Admit: 2014-03-09 | Discharge: 2014-03-13 | DRG: 292 | Disposition: A | Discharge status: Home Health | Payer: Medicare Other | Attending: Family Medicine | Admitting: Family Medicine

## 2014-03-09 ENCOUNTER — Encounter (HOSPITAL_COMMUNITY): Payer: Self-pay | Admitting: Emergency Medicine

## 2014-03-09 VITALS — BP 147/51 | HR 64 | Resp 16

## 2014-03-09 DIAGNOSIS — Z951 Presence of aortocoronary bypass graft: Secondary | ICD-10-CM

## 2014-03-09 DIAGNOSIS — Z96659 Presence of unspecified artificial knee joint: Secondary | ICD-10-CM

## 2014-03-09 DIAGNOSIS — J4489 Other specified chronic obstructive pulmonary disease: Secondary | ICD-10-CM | POA: Diagnosis present

## 2014-03-09 DIAGNOSIS — I2510 Atherosclerotic heart disease of native coronary artery without angina pectoris: Secondary | ICD-10-CM | POA: Diagnosis present

## 2014-03-09 DIAGNOSIS — Z9119 Patient's noncompliance with other medical treatment and regimen: Secondary | ICD-10-CM

## 2014-03-09 DIAGNOSIS — B351 Tinea unguium: Secondary | ICD-10-CM

## 2014-03-09 DIAGNOSIS — F329 Major depressive disorder, single episode, unspecified: Secondary | ICD-10-CM | POA: Diagnosis present

## 2014-03-09 DIAGNOSIS — I5033 Acute on chronic diastolic (congestive) heart failure: Principal | ICD-10-CM | POA: Diagnosis present

## 2014-03-09 DIAGNOSIS — N184 Chronic kidney disease, stage 4 (severe): Secondary | ICD-10-CM | POA: Diagnosis present

## 2014-03-09 DIAGNOSIS — E119 Type 2 diabetes mellitus without complications: Secondary | ICD-10-CM

## 2014-03-09 DIAGNOSIS — D509 Iron deficiency anemia, unspecified: Secondary | ICD-10-CM | POA: Diagnosis present

## 2014-03-09 DIAGNOSIS — Z91199 Patient's noncompliance with other medical treatment and regimen due to unspecified reason: Secondary | ICD-10-CM

## 2014-03-09 DIAGNOSIS — Z87891 Personal history of nicotine dependence: Secondary | ICD-10-CM

## 2014-03-09 DIAGNOSIS — I5031 Acute diastolic (congestive) heart failure: Secondary | ICD-10-CM

## 2014-03-09 DIAGNOSIS — M79609 Pain in unspecified limb: Secondary | ICD-10-CM

## 2014-03-09 DIAGNOSIS — Z8673 Personal history of transient ischemic attack (TIA), and cerebral infarction without residual deficits: Secondary | ICD-10-CM

## 2014-03-09 DIAGNOSIS — I509 Heart failure, unspecified: Secondary | ICD-10-CM

## 2014-03-09 DIAGNOSIS — Z7982 Long term (current) use of aspirin: Secondary | ICD-10-CM

## 2014-03-09 DIAGNOSIS — Z833 Family history of diabetes mellitus: Secondary | ICD-10-CM

## 2014-03-09 DIAGNOSIS — N183 Chronic kidney disease, stage 3 unspecified: Secondary | ICD-10-CM

## 2014-03-09 DIAGNOSIS — I739 Peripheral vascular disease, unspecified: Secondary | ICD-10-CM | POA: Diagnosis present

## 2014-03-09 DIAGNOSIS — D72829 Elevated white blood cell count, unspecified: Secondary | ICD-10-CM | POA: Diagnosis present

## 2014-03-09 DIAGNOSIS — E785 Hyperlipidemia, unspecified: Secondary | ICD-10-CM | POA: Diagnosis present

## 2014-03-09 DIAGNOSIS — E1159 Type 2 diabetes mellitus with other circulatory complications: Secondary | ICD-10-CM | POA: Diagnosis present

## 2014-03-09 DIAGNOSIS — Q828 Other specified congenital malformations of skin: Secondary | ICD-10-CM

## 2014-03-09 DIAGNOSIS — Z8249 Family history of ischemic heart disease and other diseases of the circulatory system: Secondary | ICD-10-CM

## 2014-03-09 DIAGNOSIS — N179 Acute kidney failure, unspecified: Secondary | ICD-10-CM | POA: Diagnosis present

## 2014-03-09 DIAGNOSIS — I10 Essential (primary) hypertension: Secondary | ICD-10-CM

## 2014-03-09 DIAGNOSIS — I129 Hypertensive chronic kidney disease with stage 1 through stage 4 chronic kidney disease, or unspecified chronic kidney disease: Secondary | ICD-10-CM | POA: Diagnosis present

## 2014-03-09 DIAGNOSIS — J449 Chronic obstructive pulmonary disease, unspecified: Secondary | ICD-10-CM | POA: Diagnosis present

## 2014-03-09 DIAGNOSIS — I798 Other disorders of arteries, arterioles and capillaries in diseases classified elsewhere: Secondary | ICD-10-CM | POA: Diagnosis present

## 2014-03-09 DIAGNOSIS — F3289 Other specified depressive episodes: Secondary | ICD-10-CM | POA: Diagnosis present

## 2014-03-09 LAB — CBC
HEMATOCRIT: 36.6 % (ref 36.0–46.0)
Hemoglobin: 11.7 g/dL — ABNORMAL LOW (ref 12.0–15.0)
MCH: 23.1 pg — AB (ref 26.0–34.0)
MCHC: 32 g/dL (ref 30.0–36.0)
MCV: 72.3 fL — ABNORMAL LOW (ref 78.0–100.0)
Platelets: 205 10*3/uL (ref 150–400)
RBC: 5.06 MIL/uL (ref 3.87–5.11)
RDW: 19.8 % — ABNORMAL HIGH (ref 11.5–15.5)
WBC: 12.4 10*3/uL — ABNORMAL HIGH (ref 4.0–10.5)

## 2014-03-09 LAB — I-STAT TROPONIN, ED: Troponin i, poc: 0 ng/mL (ref 0.00–0.08)

## 2014-03-09 LAB — BASIC METABOLIC PANEL
BUN: 63 mg/dL — AB (ref 6–23)
CALCIUM: 9.8 mg/dL (ref 8.4–10.5)
CO2: 20 mEq/L (ref 19–32)
CREATININE: 1.94 mg/dL — AB (ref 0.50–1.10)
Chloride: 109 mEq/L (ref 96–112)
GFR calc Af Amer: 26 mL/min — ABNORMAL LOW (ref >90)
GFR, EST NON AFRICAN AMERICAN: 23 mL/min — AB (ref >90)
Glucose, Bld: 125 mg/dL — ABNORMAL HIGH (ref 70–99)
Potassium: 5 mEq/L (ref 3.7–5.3)
Sodium: 145 mEq/L (ref 137–147)

## 2014-03-09 LAB — PRO B NATRIURETIC PEPTIDE: Pro B Natriuretic peptide (BNP): 4610 pg/mL — ABNORMAL HIGH (ref 0–450)

## 2014-03-09 MED ORDER — HEPARIN SODIUM (PORCINE) 5000 UNIT/ML IJ SOLN
5000.0000 [IU] | Freq: Three times a day (TID) | INTRAMUSCULAR | Status: DC
  Administered 2014-03-10 – 2014-03-13 (×10): 5000 [IU] via SUBCUTANEOUS
  Filled 2014-03-09 (×13): qty 1

## 2014-03-09 MED ORDER — FUROSEMIDE 10 MG/ML IJ SOLN
40.0000 mg | Freq: Two times a day (BID) | INTRAMUSCULAR | Status: DC
  Administered 2014-03-10 – 2014-03-13 (×7): 40 mg via INTRAVENOUS
  Filled 2014-03-09 (×9): qty 4

## 2014-03-09 MED ORDER — SODIUM CHLORIDE 0.9 % IJ SOLN
3.0000 mL | INTRAMUSCULAR | Status: DC | PRN
  Administered 2014-03-12: 3 mL via INTRAVENOUS

## 2014-03-09 MED ORDER — FUROSEMIDE 10 MG/ML IJ SOLN
40.0000 mg | Freq: Once | INTRAMUSCULAR | Status: AC
  Administered 2014-03-09: 40 mg via INTRAVENOUS
  Filled 2014-03-09: qty 4

## 2014-03-09 MED ORDER — SODIUM CHLORIDE 0.9 % IJ SOLN
3.0000 mL | Freq: Two times a day (BID) | INTRAMUSCULAR | Status: DC
  Administered 2014-03-10 – 2014-03-13 (×7): 3 mL via INTRAVENOUS

## 2014-03-09 MED ORDER — ACETAMINOPHEN 325 MG PO TABS
650.0000 mg | ORAL_TABLET | ORAL | Status: DC | PRN
  Administered 2014-03-09: 650 mg via ORAL
  Filled 2014-03-09: qty 2

## 2014-03-09 MED ORDER — ONDANSETRON HCL 4 MG/2ML IJ SOLN: 4.0000 mg | Freq: Four times a day (QID) | INTRAMUSCULAR | Status: DC | PRN

## 2014-03-09 MED ORDER — NITROGLYCERIN 0.4 MG SL SUBL: 0.4000 mg | SUBLINGUAL_TABLET | SUBLINGUAL | Status: DC | PRN

## 2014-03-09 MED ORDER — NITROGLYCERIN 2 % TD OINT
1.0000 [in_us] | TOPICAL_OINTMENT | Freq: Four times a day (QID) | TRANSDERMAL | Status: DC
  Administered 2014-03-10 (×2): 1 [in_us] via TOPICAL
  Filled 2014-03-09: qty 30

## 2014-03-09 MED ORDER — NITROGLYCERIN 2 % TD OINT
1.0000 [in_us] | TOPICAL_OINTMENT | Freq: Four times a day (QID) | TRANSDERMAL | Status: DC
  Administered 2014-03-09: 1 [in_us] via TOPICAL
  Filled 2014-03-09: qty 1

## 2014-03-09 MED ORDER — SODIUM CHLORIDE 0.9 % IV SOLN: 250.0000 mL | INTRAVENOUS | Status: DC | PRN

## 2014-03-09 NOTE — ED Notes (Signed)
Shelia from lab called for add on of pBNP sts "already taken care of"

## 2014-03-09 NOTE — ED Provider Notes (Signed)
CSN: [id number]     Arrival date & time 03/09/14  1907 History   First MD Initiated Contact with Patient 03/09/14 Mar 03, 2124     Chief Complaint  Patient presents with  . Chest Pain     (Consider location/radiation/quality/duration/timing/severity/associated sxs/prior Treatment) Patient is a 80 y.o. female presenting with shortness of breath. The history is provided by the patient.  Shortness of Breath Severity:  Severe Onset quality:  Gradual Duration:  5 hours Timing:  Constant Progression:  Worsening Chronicity:  Recurrent Context: activity   Relieved by:  None tried Worsened by:  Activity Ineffective treatments:  None tried Associated symptoms: cough   Associated symptoms: no abdominal pain, no chest pain, no fever, no headaches, no rash, no vomiting and no wheezing   Risk factors: obesity     Past Medical History  Diagnosis Date  . DM (diabetes mellitus) type II controlled peripheral vascular disorder     A1C = 5.8  . HTN (hypertension)   . CHF (congestive heart failure)     EF = 02-54%, grade 2 diastolic dysfunction  . CKD (chronic kidney disease)   . CAD (coronary artery disease)   . COPD (chronic obstructive pulmonary disease)   . Cataracts, bilateral   . Hyperlipidemia   . Peripheral vascular disease bilateral    S/P femoral-popliteal bypass surgery  . Glaucoma   . TIA (transient ischemic attack)    Past Surgical History  Procedure Laterality Date  . Coronary artery bypass graft    . Spine surgery    . Eye surgery  cataract  . Carpal tunnel release  left  . Breast surgery  lumpectomy right  . Aortogram w/ runoff  06/06/11    right leg  . Above knee leg amputation  07/27/11    Right AKA   Family History  Problem Relation Age of Onset  . Hypertension Mother   . Peripheral vascular disease Mother   . Diabetes Father   . Kidney disease Sister   . Cancer Brother     lung  . Lung disease Sister   . Lung disease Sister   . Kidney disease Daughter     On  dialysis, died in 2006/03/03   History  Substance Use Topics  . Smoking status: Former Smoker -- 1.00 packs/day for 40 years    Types: Cigarettes    Quit date: 04/13/1977  . Smokeless tobacco: Never Used  . Alcohol Use: No   OB History   Grav Para Term Preterm Abortions TAB SAB Ect Mult Living                 Review of Systems  Constitutional: Negative for fever.  HENT: Negative for congestion, rhinorrhea and trouble swallowing.   Respiratory: Positive for cough and shortness of breath. Negative for wheezing.   Cardiovascular: Negative for chest pain and leg swelling.  Gastrointestinal: Negative for nausea, vomiting and abdominal pain.  Skin: Negative for rash.  Neurological: Negative for dizziness and headaches.  All other systems reviewed and are negative.     Allergies  Codeine; Omnipaque; Celebrex; and Iodine  Home Medications   Prior to Admission medications   Medication Sig Start Date End Date Taking? Authorizing Provider  allopurinol (ZYLOPRIM) 100 MG tablet Take 1 tablet (100 mg total) by mouth every evening. 07/01/12 07/01/13  Erline Hau, MD  amLODipine (NORVASC) 5 MG tablet Take 10 mg by mouth daily.     Historical Provider, MD  aspirin EC 81 MG tablet Take  81 mg by mouth daily.    Historical Provider, MD  Azelastine HCl (ASTEPRO) 0.15 % SOLN Place into the nose.    Historical Provider, MD  b complex-vitamin c-folic acid (NEPHRO-VITE) 0.8 MG TABS Take 0.8 mg by mouth at bedtime.      Historical Provider, MD  carvedilol (COREG) 6.25 MG tablet Take 1 tablet (6.25 mg total) by mouth 2 (two) times daily with a meal. 07/02/12 07/02/13  Erline Hau, MD  citalopram (CELEXA) 10 MG tablet Take 20 mg by mouth daily.     Historical Provider, MD  clotrimazole (LOTRIMIN) 1 % external solution Apply 1 application topically as needed. Apply to fungal rash on foot    Historical Provider, MD  Diphenhyd-Hydrocort-Nystatin SUSP Use as directed 15 mLs in the mouth  or throat 2 (two) times daily. 07/01/12   Erline Hau, MD  Diphenhyd-Hydrocort-Nystatin SUSP Use as directed 5 mLs in the mouth or throat 2 (two) times daily. 07/02/12   Erline Hau, MD  epoetin alfa (EPOGEN,PROCRIT) 2000 UNIT/ML injection Inject 2,000 Units into the skin every 14 (fourteen) days.    Historical Provider, MD  ezetimibe (ZETIA) 10 MG tablet Take 10 mg by mouth daily.      Historical Provider, MD  famotidine (PEPCID) 20 MG tablet Take 20 mg by mouth daily.     Historical Provider, MD  Filgrastim (NEUPOGEN IJ) Inject as directed 2 (two) times a week. Twice a month.    Historical Provider, MD  furosemide (LASIX) 40 MG tablet Take 20 mg by mouth 2 (two) times daily. 07/01/12 07/01/13  Erline Hau, MD  glimepiride (AMARYL) 2 MG tablet Take 2 mg by mouth daily before breakfast.      Historical Provider, MD  glucosamine-chondroitin 500-400 MG tablet Take 3 tablets by mouth 3 (three) times daily.     Historical Provider, MD  hydrALAZINE (APRESOLINE) 50 MG tablet Take 75 mg by mouth 3 (three) times daily.     Historical Provider, MD  HYDROcodone-acetaminophen (NORCO) 5-325 MG per tablet Take 1-2 tablets by mouth every 6 (six) hours as needed. For pain    Historical Provider, MD  isosorbide dinitrate (ISORDIL) 40 MG tablet Take 40 mg by mouth 2 (two) times daily.     Historical Provider, MD  loperamide (IMODIUM A-D) 2 MG tablet Take 2 mg by mouth 4 (four) times daily as needed for diarrhea or loose stools.    Historical Provider, MD  loratadine (CLARITIN) 10 MG tablet Take 10 mg by mouth daily as needed. For allergies.    Historical Provider, MD  MONTELUKAST SODIUM PO Take by mouth.    Historical Provider, MD  mupirocin (BACTROBAN) 2 % ointment Apply 1 application topically 2 (two) times daily as needed. For skin breakouts.    Historical Provider, MD  oxycodone (OXY-IR) 5 MG capsule Take 5 mg by mouth every 4 (four) hours as needed. For pain.    Historical  Provider, MD  polyethylene glycol (MIRALAX / GLYCOLAX) packet Take 17 g by mouth daily as needed. For constipation.    Historical Provider, MD  Polysaccharide Iron Complex (FERREX 150 PO) Take 1 tablet by mouth 2 (two) times daily.     Historical Provider, MD  potassium chloride (K-DUR) 10 MEQ tablet Take 2 tablets (20 mEq total) by mouth daily. 07/01/12 07/01/13  Erline Hau, MD  sennosides-docusate sodium (SENOKOT-S) 8.6-50 MG tablet Take 2 tablets by mouth daily.     Historical Provider,  MD  timolol (TIMOPTIC-XR) 0.25 % ophthalmic gel-forming Place 1 drop into both eyes daily.     Historical Provider, MD  triamcinolone (KENALOG) 0.1 % cream Apply 1 application topically 2 (two) times daily as needed. For skin breakouts.    Historical Provider, MD  vancomycin (VANCOCIN) 50 mg/mL oral solution Take 2.5 mLs (125 mg total) by mouth 4 (four) times daily. 07/02/12   Erline Hau, MD   BP 193/60  Pulse 83  Resp 25  Ht 5\' 3"  (1.6 m)  Wt 180 lb (81.647 kg)  BMI 31.89 kg/m2  SpO2 91% Physical Exam  Nursing note and vitals reviewed. Constitutional: She is oriented to person, place, and time. She appears well-developed and well-nourished. No distress.  HENT:  Head: Normocephalic and atraumatic.  Eyes: Pupils are equal, round, and reactive to light.  Neck: Normal range of motion.  Cardiovascular: Normal rate and regular rhythm.   No murmur heard. Pulmonary/Chest: No respiratory distress. She has no wheezes. She exhibits no tenderness.  Abdominal: Soft.  Musculoskeletal: Normal range of motion.  Lymphadenopathy:    She has no cervical adenopathy.  Neurological: She is alert and oriented to person, place, and time.  Skin: Skin is warm and dry.    ED Course  Procedures (including critical care time) Labs Review Labs Reviewed  CBC - Abnormal; Notable for the following:    WBC 12.4 (*)    Hemoglobin 11.7 (*)    MCV 72.3 (*)    MCH 23.1 (*)    RDW 19.8 (*)    All  other components within normal limits  BASIC METABOLIC PANEL - Abnormal; Notable for the following:    Glucose, Bld 125 (*)    BUN 63 (*)    Creatinine, Ser 1.94 (*)    GFR calc non Af Amer 23 (*)    GFR calc Af Amer 26 (*)    All other components within normal limits  PRO B NATRIURETIC PEPTIDE - Abnormal; Notable for the following:    Pro B Natriuretic peptide (BNP) 4610.0 (*)    All other components within normal limits  Randolm Idol, ED    Imaging Review Dg Chest Port 1 View  03/09/2014   CLINICAL DATA:  Chest pain.  EXAM: PORTABLE CHEST - 1 VIEW  COMPARISON:  Chest radiograph performed 01/03/2013  FINDINGS: The lungs are relatively well expanded. Vascular congestion is noted, with diffusely increased interstitial markings, worse on the left. This may reflect pulmonary edema, multifocal pneumonia or less likely underlying interstitial lung disease. A small left pleural effusion is suspected. No pneumothorax is seen.  The cardiomediastinal silhouette is mildly enlarged. The patient is status post median sternotomy, with evidence of prior CABG. No acute osseous abnormalities are identified. Clips are seen overlying the right axilla.  IMPRESSION: Vascular congestion and mild cardiomegaly, with diffusely increased interstitial markings, worse on the left. This may reflect asymmetric pulmonary edema, multifocal pneumonia or less likely underlying interstitial lung disease. Suspect small left pleural effusion.   Electronically Signed   By: Garald Balding M.D.   On: 03/09/2014 21:30     EKG Interpretation None      MDM   Final diagnoses:  CHF (congestive heart failure)         Garald Balding, NP 03/09/14 2247

## 2014-03-09 NOTE — ED Provider Notes (Signed)
Patient developed a mild heaviness in her chest and shortness of breath onset 2:30 PM today after returning from having ingrown toenails surgerized she feels improved since treatment with oxygen by EMS. Other symptoms include cough for 2 years. Productive of white sputum for one week. She denies fever denies nausea or vomiting. On exam alert nontoxic HEENT exam extremities moist pink neck supple positive JVD lungs Rales at bases bilaterally heart regular rate and rhythm abdomen morbidly obese nontender extremities there is a right AKA left lower extremity without edema neurovascular intact  Orlie Dakin, MD 03/09/14 2143

## 2014-03-09 NOTE — Progress Notes (Signed)
Subjective:     Patient ID: [name], female   DOB: Apr 03, 1927, 80 y.o.   MRN: [id number]  HPI patient presents with nail disease 1-5 left with at risk medical condition and keratotic lesion on the left first metatarsal head. Right leg has been amputated   Review of Systems     Objective:   Physical Exam Neurovascular status diminished with thick nailbeds 1-5 left that are painful and keratotic lesion left first metatarsal but is at risk for ulceration    Assessment:     At risk diabetic with nail disease 1-5 left and porokeratotic type lesion left first metatarsal    Plan:     Carefully debride lesion on the left first metatarsal and debridement nailbeds 1-5 left with no small amount of bleeding left hallux for which I applied Band-Aid and Neosporin and gave instructions on soaks. If any redness or drainage were to occur in the toe patient is to come in and let us know immediately

## 2014-03-09 NOTE — H&P (Addendum)
[facility] History and Physical  [name] [id number] DOB: 01-25-1927 DOA: 03/09/2014  Referring physician: EDP PCP: Simona Huh, MD   Chief Complaint: SOB   HPI: [name] is a 80 y.o. female who presents to the ED with SOB.  She also has a several day history of cough productive of a whitish sputum.  Today this is acutely worse.  She is also noted to be very hypertensive in the ED today.  When I asked the patient and family if she missed any BP meds today, they reply that actually yes she had just stopped her lasix, norvasc, and coreg today as it was on her med rec sheet from her last PCPs visit to [place] these.  (Review of the sheet does indeed show that these were discontinued, presumably as duplicate meds on the list however as they are still on the med list under their generic names).  Unsurprisingly she is found to be in CHF.  Review of Systems: Systems reviewed.  As above, otherwise negative  Past Medical History  Diagnosis Date  . DM (diabetes mellitus) type II controlled peripheral vascular disorder     A1C = 5.8  . HTN (hypertension)   . CHF (congestive heart failure)     EF = 99991111, grade 2 diastolic dysfunction  . CKD (chronic kidney disease)   . CAD (coronary artery disease)   . COPD (chronic obstructive pulmonary disease)   . Cataracts, bilateral   . Hyperlipidemia   . Peripheral vascular disease bilateral    S/P femoral-popliteal bypass surgery  . Glaucoma   . TIA (transient ischemic attack)    Past Surgical History  Procedure Laterality Date  . Coronary artery bypass graft    . Spine surgery    . Eye surgery  cataract  . Carpal tunnel release  left  . Breast surgery  lumpectomy right  . Aortogram w/ runoff  06/06/11    right leg  . Above knee leg amputation  07/27/11    Right AKA   Social History:  reports that she quit smoking about 36 years ago. Her smoking use included Cigarettes. She has a 40 pack-year smoking history. She has  never used smokeless tobacco. She reports that she does not drink alcohol or use illicit drugs.  Allergies  Allergen Reactions  . Codeine   . Omnipaque [Iohexol]   . Celebrex [Celecoxib] Rash  . Iodine Rash    Family History  Problem Relation Age of Onset  . Hypertension Mother   . Peripheral vascular disease Mother   . Diabetes Father   . Kidney disease Sister   . Cancer Brother     lung  . Lung disease Sister   . Lung disease Sister   . Kidney disease Daughter     On dialysis, died in 03/14/2006     Prior to Admission medications   Medication Sig Start Date End Date Taking? Authorizing Provider  allopurinol (ZYLOPRIM) 100 MG tablet Take 1 tablet (100 mg total) by mouth every evening. 07/01/12 07/01/13  Erline Hau, MD  amLODipine (NORVASC) 5 MG tablet Take 10 mg by mouth daily.     Historical Provider, MD  aspirin EC 81 MG tablet Take 81 mg by mouth daily.    Historical Provider, MD  Azelastine HCl (ASTEPRO) 0.15 % SOLN Place into the nose.    Historical Provider, MD  b complex-vitamin c-folic acid (NEPHRO-VITE) 0.8 MG TABS Take 0.8 mg by mouth at bedtime.  Historical Provider, MD  carvedilol (COREG) 6.25 MG tablet Take 1 tablet (6.25 mg total) by mouth 2 (two) times daily with a meal. 07/02/12 07/02/13  Erline Hau, MD  citalopram (CELEXA) 10 MG tablet Take 20 mg by mouth daily.     Historical Provider, MD  clotrimazole (LOTRIMIN) 1 % external solution Apply 1 application topically as needed. Apply to fungal rash on foot    Historical Provider, MD  Diphenhyd-Hydrocort-Nystatin SUSP Use as directed 15 mLs in the mouth or throat 2 (two) times daily. 07/01/12   Erline Hau, MD  Diphenhyd-Hydrocort-Nystatin SUSP Use as directed 5 mLs in the mouth or throat 2 (two) times daily. 07/02/12   Erline Hau, MD  epoetin alfa (EPOGEN,PROCRIT) 2000 UNIT/ML injection Inject 2,000 Units into the skin every 14 (fourteen) days.    Historical  Provider, MD  ezetimibe (ZETIA) 10 MG tablet Take 10 mg by mouth daily.      Historical Provider, MD  famotidine (PEPCID) 20 MG tablet Take 20 mg by mouth daily.     Historical Provider, MD  Filgrastim (NEUPOGEN IJ) Inject as directed 2 (two) times a week. Twice a month.    Historical Provider, MD  furosemide (LASIX) 40 MG tablet Take 20 mg by mouth 2 (two) times daily. 07/01/12 07/01/13  Erline Hau, MD  glimepiride (AMARYL) 2 MG tablet Take 2 mg by mouth daily before breakfast.      Historical Provider, MD  glucosamine-chondroitin 500-400 MG tablet Take 3 tablets by mouth 3 (three) times daily.     Historical Provider, MD  hydrALAZINE (APRESOLINE) 50 MG tablet Take 75 mg by mouth 3 (three) times daily.     Historical Provider, MD  HYDROcodone-acetaminophen (NORCO) 5-325 MG per tablet Take 1-2 tablets by mouth every 6 (six) hours as needed. For pain    Historical Provider, MD  isosorbide dinitrate (ISORDIL) 40 MG tablet Take 40 mg by mouth 2 (two) times daily.     Historical Provider, MD  loperamide (IMODIUM A-D) 2 MG tablet Take 2 mg by mouth 4 (four) times daily as needed for diarrhea or loose stools.    Historical Provider, MD  loratadine (CLARITIN) 10 MG tablet Take 10 mg by mouth daily as needed. For allergies.    Historical Provider, MD  MONTELUKAST SODIUM PO Take by mouth.    Historical Provider, MD  mupirocin (BACTROBAN) 2 % ointment Apply 1 application topically 2 (two) times daily as needed. For skin breakouts.    Historical Provider, MD  oxycodone (OXY-IR) 5 MG capsule Take 5 mg by mouth every 4 (four) hours as needed. For pain.    Historical Provider, MD  polyethylene glycol (MIRALAX / GLYCOLAX) packet Take 17 g by mouth daily as needed. For constipation.    Historical Provider, MD  Polysaccharide Iron Complex (FERREX 150 PO) Take 1 tablet by mouth 2 (two) times daily.     Historical Provider, MD  potassium chloride (K-DUR) 10 MEQ tablet Take 2 tablets (20 mEq total) by  mouth daily. 07/01/12 07/01/13  Erline Hau, MD  sennosides-docusate sodium (SENOKOT-S) 8.6-50 MG tablet Take 2 tablets by mouth daily.     Historical Provider, MD  timolol (TIMOPTIC-XR) 0.25 % ophthalmic gel-forming Place 1 drop into both eyes daily.     Historical Provider, MD  triamcinolone (KENALOG) 0.1 % cream Apply 1 application topically 2 (two) times daily as needed. For skin breakouts.    Historical Provider, MD  vancomycin (  VANCOCIN) 50 mg/mL oral solution Take 2.5 mLs (125 mg total) by mouth 4 (four) times daily. 07/02/12   Erline Hau, MD   Physical Exam: Filed Vitals:   03/09/14 2230  BP: 183/52  Pulse: 79  Resp: 26    BP 183/52  Pulse 79  Resp 26  Ht 5\' 3"  (1.6 m)  Wt 81.647 kg (180 lb)  BMI 31.89 kg/m2  SpO2 93%  General Appearance:    Alert, oriented, no distress, appears stated age  Head:    Normocephalic, atraumatic  Eyes:    PERRL, EOMI, sclera non-icteric        Nose:   Nares without drainage or epistaxis. Mucosa, turbinates normal  Throat:   Moist mucous membranes. Oropharynx without erythema or exudate.  Neck:   Supple. No carotid bruits.  No thyromegaly.  No lymphadenopathy.   Back:     No CVA tenderness, no spinal tenderness  Lungs:     Crackles bilaterally  Chest wall:    No tenderness to palpitation  Heart:    Regular rate and rhythm without murmurs, gallops, rubs  Abdomen:     Soft, non-tender, nondistended, normal bowel sounds, no organomegaly  Genitalia:    deferred  Rectal:    deferred  Extremities:   No clubbing, cyanosis or edema.  Pulses:   2+ and symmetric all extremities  Skin:   Skin color, texture, turgor normal, no rashes or lesions  Lymph nodes:   Cervical, supraclavicular, and axillary nodes normal  Neurologic:   CNII-XII intact. Normal strength, sensation and reflexes      throughout    Labs on Admission:  Basic Metabolic Panel:  Recent Labs Lab 03/09/14 2013  NA 145  K 5.0  CL 109  CO2 20  GLUCOSE  125*  BUN 63*  CREATININE 1.94*  CALCIUM 9.8   Liver Function Tests: No results found for this basename: AST, ALT, ALKPHOS, BILITOT, PROT, ALBUMIN,  in the last 168 hours No results found for this basename: LIPASE, AMYLASE,  in the last 168 hours No results found for this basename: AMMONIA,  in the last 168 hours CBC:  Recent Labs Lab 03/09/14 2013  WBC 12.4*  HGB 11.7*  HCT 36.6  MCV 72.3*  PLT 205   Cardiac Enzymes: No results found for this basename: CKTOTAL, CKMB, CKMBINDEX, TROPONINI,  in the last 168 hours  BNP (last 3 results)  Recent Labs  03/09/14 2013  PROBNP 4610.0*   CBG: No results found for this basename: GLUCAP,  in the last 168 hours  Radiological Exams on Admission: Dg Chest Port 1 View  03/09/2014   CLINICAL DATA:  Chest pain.  EXAM: PORTABLE CHEST - 1 VIEW  COMPARISON:  Chest radiograph performed 01/03/2013  FINDINGS: The lungs are relatively well expanded. Vascular congestion is noted, with diffusely increased interstitial markings, worse on the left. This may reflect pulmonary edema, multifocal pneumonia or less likely underlying interstitial lung disease. A small left pleural effusion is suspected. No pneumothorax is seen.  The cardiomediastinal silhouette is mildly enlarged. The patient is status post median sternotomy, with evidence of prior CABG. No acute osseous abnormalities are identified. Clips are seen overlying the right axilla.  IMPRESSION: Vascular congestion and mild cardiomegaly, with diffusely increased interstitial markings, worse on the left. This may reflect asymmetric pulmonary edema, multifocal pneumonia or less likely underlying interstitial lung disease. Suspect small left pleural effusion.   Electronically Signed   By: Garald Balding M.D.   On:  03/09/2014 21:30    EKG: Independently reviewed.  Assessment/Plan Principal Problem:   Acute diastolic CHF (congestive heart failure) Active Problems:   DM2 (diabetes mellitus, type 2)    CKD (chronic kidney disease), stage III   HTN (hypertension)   1. Acute diastolic CHF - I believe the most likely reason for acute decompensation is due to stopping multiple BP meds (SBP in ED running in the 180s and 190s) and lasix today.  Will treat patient with lasix, nitro paste, resume BP meds including those that were stopped (apparently inadvertently).  On heart failure pathway. 2. DM2 - until home meds are known, will put patient on SSI med dose AC/HS, patient believed to be on Amaryl but awaiting pharmacy to complete med rec CBG checks AC/HS 3. CKD stage 3 - creatinine appears to be at baseline today 4. HTN - resuming home meds as noted above as well as NTG paste, adding labetalol PRN until home med rec can be completed by pharmacy.    Code Status: Full Code  Family Communication: Family at bedside Disposition Plan: Admit to inpatient   Time spent: 26 min  [facility] Pager [phone]  If 7AM-7PM, please contact the day team taking care of the patient [email or web address] Password [facility] 03/09/2014, 11:04 PM

## 2014-03-09 NOTE — ED Provider Notes (Signed)
Medical screening examination/treatment/procedure(s) were conducted as a shared visit with non-physician practitioner(s) and myself.  I personally evaluated the patient during the encounter.   EKG Interpretation None       Orlie Dakin, MD 03/09/14 2355

## 2014-03-09 NOTE — ED Notes (Signed)
Per EMS patient was seen at outpatient surgical center for ingrown toenail. Patient c/o chest tightness, diaphoresis and chills non-radaiting no SOB, . Has had 324 ASA.   Lung sounds clear throughout SpO2 90% placed on 2L O2 [place] SpO2 96%  CBG 147

## 2014-03-09 NOTE — Patient Instructions (Signed)
Diabetes and Foot Care Diabetes may cause you to have problems because of poor blood supply (circulation) to your feet and legs. This may cause the skin on your feet to become thinner, break easier, and heal more slowly. Your skin may become dry, and the skin may peel and crack. You may also have nerve damage in your legs and feet causing decreased feeling in them. You may not notice minor injuries to your feet that could lead to infections or more serious problems. Taking care of your feet is one of the most important things you can do for yourself.  HOME CARE INSTRUCTIONS  Wear shoes at all times, even in the house. Do not go barefoot. Bare feet are easily injured.  Check your feet daily for blisters, cuts, and redness. If you cannot see the bottom of your feet, use a mirror or ask someone for help.  Wash your feet with warm water (do not use hot water) and mild soap. Then pat your feet and the areas between your toes until they are completely dry. Do not soak your feet as this can dry your skin.  Apply a moisturizing lotion or petroleum jelly (that does not contain alcohol and is unscented) to the skin on your feet and to dry, brittle toenails. Do not apply lotion between your toes.  Trim your toenails straight across. Do not dig under them or around the cuticle. File the edges of your nails with an emery board or nail file.  Do not cut corns or calluses or try to remove them with medicine.  Wear clean socks or stockings every day. Make sure they are not too tight. Do not wear knee-high stockings since they may decrease blood flow to your legs.  Wear shoes that fit properly and have enough cushioning. To break in new shoes, wear them for just a few hours a day. This prevents you from injuring your feet. Always look in your shoes before you put them on to be sure there are no objects inside.  Do not cross your legs. This may decrease the blood flow to your feet.  If you find a minor scrape,  cut, or break in the skin on your feet, keep it and the skin around it clean and dry. These areas may be cleansed with mild soap and water. Do not cleanse the area with peroxide, alcohol, or iodine.  When you remove an adhesive bandage, be sure not to damage the skin around it.  If you have a wound, look at it several times a day to make sure it is healing.  Do not use heating pads or hot water bottles. They may burn your skin. If you have lost feeling in your feet or legs, you may not know it is happening until it is too late.  Make sure your health care provider performs a complete foot exam at least annually or more often if you have foot problems. Report any cuts, sores, or bruises to your health care provider immediately. SEEK MEDICAL CARE IF:   You have an injury that is not healing.  You have cuts or breaks in the skin.  You have an ingrown nail.  You notice redness on your legs or feet.  You feel burning or tingling in your legs or feet.  You have pain or cramps in your legs and feet.  Your legs or feet are numb.  Your feet always feel cold. SEEK IMMEDIATE MEDICAL CARE IF:   There is increasing redness,   swelling, or pain in or around a wound.  There is a red line that goes up your leg.  Pus is coming from a wound.  You develop a fever or as directed by your health care provider.  You notice a bad smell coming from an ulcer or wound. Document Released: 10/27/2000 Document Revised: 07/02/2013 Document Reviewed: 04/08/2013 ExitCare Patient Information 2014 [employer].  

## 2014-03-09 NOTE — ED Notes (Signed)
Portable at bedside 

## 2014-03-10 DIAGNOSIS — I509 Heart failure, unspecified: Secondary | ICD-10-CM

## 2014-03-10 DIAGNOSIS — I059 Rheumatic mitral valve disease, unspecified: Secondary | ICD-10-CM

## 2014-03-10 LAB — BASIC METABOLIC PANEL
BUN: 64 mg/dL — ABNORMAL HIGH (ref 6–23)
CALCIUM: 9.2 mg/dL (ref 8.4–10.5)
CO2: 21 meq/L (ref 19–32)
CREATININE: 1.95 mg/dL — AB (ref 0.50–1.10)
Chloride: 108 mEq/L (ref 96–112)
GFR calc Af Amer: 26 mL/min — ABNORMAL LOW (ref >90)
GFR, EST NON AFRICAN AMERICAN: 22 mL/min — AB (ref >90)
GLUCOSE: 121 mg/dL — AB (ref 70–99)
Potassium: 4.7 mEq/L (ref 3.7–5.3)
Sodium: 144 mEq/L (ref 137–147)

## 2014-03-10 LAB — GLUCOSE, CAPILLARY
GLUCOSE-CAPILLARY: 111 mg/dL — AB (ref 70–99)
GLUCOSE-CAPILLARY: 151 mg/dL — AB (ref 70–99)
Glucose-Capillary: 125 mg/dL — ABNORMAL HIGH (ref 70–99)
Glucose-Capillary: 132 mg/dL — ABNORMAL HIGH (ref 70–99)
Glucose-Capillary: 110 mg/dL — ABNORMAL HIGH (ref 70–99)

## 2014-03-10 MED ORDER — LABETALOL HCL 5 MG/ML IV SOLN
10.0000 mg | INTRAVENOUS | Status: DC | PRN
  Administered 2014-03-10: 10 mg via INTRAVENOUS
  Filled 2014-03-10: qty 4

## 2014-03-10 MED ORDER — ASPIRIN EC 81 MG PO TBEC
81.0000 mg | DELAYED_RELEASE_TABLET | Freq: Every day | ORAL | Status: DC
  Administered 2014-03-10 – 2014-03-13 (×4): 81 mg via ORAL
  Filled 2014-03-10 (×4): qty 1

## 2014-03-10 MED ORDER — EZETIMIBE 10 MG PO TABS
10.0000 mg | ORAL_TABLET | Freq: Every day | ORAL | Status: DC
  Administered 2014-03-10 – 2014-03-13 (×4): 10 mg via ORAL
  Filled 2014-03-10 (×4): qty 1

## 2014-03-10 MED ORDER — POLYSACCHARIDE IRON COMPLEX 150 MG PO CAPS
150.0000 mg | ORAL_CAPSULE | Freq: Two times a day (BID) | ORAL | Status: DC
  Administered 2014-03-10 – 2014-03-13 (×6): 150 mg via ORAL
  Filled 2014-03-10 (×8): qty 1

## 2014-03-10 MED ORDER — AMLODIPINE BESYLATE 10 MG PO TABS
10.0000 mg | ORAL_TABLET | Freq: Every day | ORAL | Status: DC
  Administered 2014-03-10 – 2014-03-13 (×4): 10 mg via ORAL
  Filled 2014-03-10 (×4): qty 1

## 2014-03-10 MED ORDER — CARVEDILOL 6.25 MG PO TABS
6.2500 mg | ORAL_TABLET | Freq: Two times a day (BID) | ORAL | Status: DC
  Administered 2014-03-10 – 2014-03-13 (×7): 6.25 mg via ORAL
  Filled 2014-03-10 (×9): qty 1

## 2014-03-10 MED ORDER — HYDRALAZINE HCL 50 MG PO TABS
75.0000 mg | ORAL_TABLET | Freq: Three times a day (TID) | ORAL | Status: DC
  Administered 2014-03-10 (×2): 75 mg via ORAL
  Filled 2014-03-10 (×4): qty 1

## 2014-03-10 MED ORDER — CITALOPRAM HYDROBROMIDE 20 MG PO TABS
20.0000 mg | ORAL_TABLET | Freq: Every day | ORAL | Status: DC
  Administered 2014-03-10 – 2014-03-13 (×4): 20 mg via ORAL
  Filled 2014-03-10 (×4): qty 1

## 2014-03-10 MED ORDER — INSULIN ASPART 100 UNIT/ML ~~LOC~~ SOLN: 0.0000 [IU] | Freq: Three times a day (TID) | SUBCUTANEOUS | Status: DC

## 2014-03-10 MED ORDER — GLUCERNA SHAKE PO LIQD: 237.0000 mL | Freq: Every day | ORAL | Status: DC | PRN

## 2014-03-10 MED ORDER — GLIMEPIRIDE 2 MG PO TABS
2.0000 mg | ORAL_TABLET | Freq: Every day | ORAL | Status: DC
  Administered 2014-03-10: 2 mg via ORAL
  Filled 2014-03-10 (×2): qty 1

## 2014-03-10 MED ORDER — ACETAMINOPHEN 325 MG PO TABS
650.0000 mg | ORAL_TABLET | ORAL | Status: DC | PRN
  Administered 2014-03-10 – 2014-03-11 (×3): 650 mg via ORAL
  Filled 2014-03-10 (×3): qty 2

## 2014-03-10 MED ORDER — HYDRALAZINE HCL 50 MG PO TABS
100.0000 mg | ORAL_TABLET | Freq: Three times a day (TID) | ORAL | Status: DC
  Administered 2014-03-10 – 2014-03-13 (×9): 100 mg via ORAL
  Filled 2014-03-10 (×11): qty 2

## 2014-03-10 NOTE — Progress Notes (Signed)
Patient arrived to unit from ED via stretcher.  Patient alert and oriented and non-ambulatory with a right AKA.  Admission weight, vitals and assessment completed. Patient BP remains elevated, MD text paged for further orders. Fall and safety plan reviewed with patient and family.  Patient currently resting comfortably, call light within reach and bed alarm in use.  Will continue to monitor. Blood pressure 185/56, pulse 81, temperature 99.1 F (37.3 C), temperature source Oral, resp. rate 20, height 5\' 5"  (1.651 m), weight 90.2 kg (198 lb 13.7 oz), SpO2 100.00%.  Tresa Endo

## 2014-03-10 NOTE — Progress Notes (Signed)
Note: This document was prepared with digital dictation and possible smart phrase technology. Any transcriptional errors that result from this process are unintentional.   [name] LGX:[id number] DOB: 1926/12/15 DOA: 03/09/2014 PCP: Simona Huh, MD  Brief narrative: 80 y/o ?, known h/o CHF followed by Dr. Wynonia Lawman, DM ty 2, CKD stage 3-4, S/p AKA, prior h/o breast ca, prior COPD, prior fem-pop bypass, CAD s/p CABG, prior TIA admitted to hospital 03/09/44 with decompensated likely diastolic HF in a setting of non-compliance.   Past medical history-As per Problem list Chart reviewed as below- reviewed  Consultants:  None currently  Procedures:  ECHO  Antibiotics:   none   Subjective  Alert pleasant Desats to 885 on RA. Tol diet well No CP  No Le edema No fever or chills   Objective    Interim History: none  Telemetry: [place]   Objective: Filed Vitals:   03/10/14 0125 03/10/14 0533 03/10/14 1017 03/10/14 1335  BP: 185/56 166/64 158/63 148/99  Pulse: 81 71 74 75  Temp: 99.1 F (37.3 C) 99.1 F (37.3 C)  98.8 F (37.1 C)  TempSrc: Oral Oral  Oral  Resp: 20 21  18   Height:      Weight:  90.2 kg (198 lb 13.7 oz)  88.2 kg (194 lb 7.1 oz)  SpO2: 100% 96%  92%    Intake/Output Summary (Last 24 hours) at 03/10/14 1409 Last data filed at 03/10/14 1025  Gross per 24 hour  Intake    120 ml  Output    300 ml  Net   -180 ml    Exam:  General: eomi, ncat, mild JVD Cardiovascular: s1 s2 no m/r/g Respiratory: clear, no added sounds Abdomen:  No le edema Skinmild LLE edema Neurointact  Data Reviewed: Basic Metabolic Panel:  Recent Labs Lab 03/09/14 2013 03/10/14 0520  NA 145 144  K 5.0 4.7  CL 109 108  CO2 20 21  GLUCOSE 125* 121*  BUN 63* 64*  CREATININE 1.94* 1.95*  CALCIUM 9.8 9.2   Liver Function Tests: No results found for this basename: AST, ALT, ALKPHOS, BILITOT, PROT, ALBUMIN,  in the last 168 hours No results found for this  basename: LIPASE, AMYLASE,  in the last 168 hours No results found for this basename: AMMONIA,  in the last 168 hours CBC:  Recent Labs Lab 03/09/14 2013  WBC 12.4*  HGB 11.7*  HCT 36.6  MCV 72.3*  PLT 205   Cardiac Enzymes: No results found for this basename: CKTOTAL, CKMB, CKMBINDEX, TROPONINI,  in the last 168 hours BNP: No components found with this basename: POCBNP,  CBG:  Recent Labs Lab 03/10/14 0057 03/10/14 0603 03/10/14 1041  GLUCAP 125* 132* 111*    No results found for this or any previous visit (from the past 240 hour(s)).   Studies:              All Imaging reviewed and is as per above notation   Scheduled Meds: . amLODipine  10 mg Oral Daily  . aspirin EC  81 mg Oral Daily  . carvedilol  6.25 mg Oral BID WC  . furosemide  40 mg Intravenous Q12H  . glimepiride  2 mg Oral QAC breakfast  . heparin  5,000 Units Subcutaneous 3 times per day  . hydrALAZINE  75 mg Oral TID  . nitroGLYCERIN  1 inch Topical 4 times per day  . sodium chloride  3 mL Intravenous Q12H   Continuous Infusions:  Assessment/Plan:  1. Acute decompensated likely Diastolic HF-continue lasix Iv 40 mg bid.  WIll also continue Coreg 6.25 po bid, hydralazine 75 tid increased to 100 tid.  Will d/c nitropaste-await 2. Htn urgency-increase Hydralazine as above.  Hold Labetalol.  Continue Amlodipine 10 daily  Poor control 2/2 to non-understanding of meds.  Will educate 3. DM ty 2-Hold Amaryl for now.  Check only am sugars 4. AKI superimposed on CKD 3-4-patient was probably held off of some Htn meds due to worsening labs.  monitor daily with labs.  Patient will need clear discussion as OP with both PCP and Likely Cardiology as to needs as OP 5. Leukocytosis-monitor.  RPt CBC in am 6. Depression Celexa 20 daily 7. Rash in stomach-will monitor 8. HLD-continue Zetia 10 daily 9. Likely Iron deficiency aenmia-monitor.  contininue Iron replacement  Code Status: Full Family Communication:   Discussed with ? # fr HCPOA Marsh,Cynthia Daughter [phone] on telephone Disposition Plan:  Inpatient likely 2-3 days  Verneita Griffes, MD  [facility] Pager [phone] 03/10/2014, 2:09 PM    LOS: 1 day

## 2014-03-10 NOTE — Care Management Note (Addendum)
  Page 1 of 1   03/10/2014     10:41:41 AM CARE MANAGEMENT NOTE 03/10/2014  Patient:  [name], [name]   Account Number:  [id number]  Date Initiated:  03/10/2014  Documentation initiated by:  [facility]  Subjective/Objective Assessment:   80 yo female Principal Problem: Acute diastolic CHF (congestive heart failure);Active Problems: DM2 (diabetes mellitus, type 2); CKD (chronic kidney disease), stage III; HTN (hypertension)//Home with grand daughter     Action/Plan:   IV diureses//Access for [facility] services   Anticipated DC Date:  03/13/2014   Anticipated DC Plan:  [place]  CM consult      [facility] Choice  HOME HEALTH   Choice offered to / List presented to:  C-1 Patient        [place] arranged  HH-1 RN  [place]      [place].   Status of service:  In process, will continue to follow Medicare Important Message given?   (If response is "NO", the following Medicare IM given date fields will be blank) Date Medicare IM given:   Date Additional Medicare IM given:    Discharge Disposition:    Per UR Regulation:  Reviewed for med. necessity/level of care/duration of stay  If discussed at [place] of Stay Meetings, dates discussed:    Comments:  03/10/14 [place], RN, BSN, [employer] [phone] Spoke with pt at bedside regarding discharge planning for [facility]. Offered pt list of home health agencies to choose from.  Pt chose [facility] to render services. Janae Sauce, RN of [facility] notified.  No DME needs identified at this time.  Pt currently has [place] [facility] through [facility].

## 2014-03-10 NOTE — Progress Notes (Signed)
Pt with temp 100.4 oral. MD on call notified. Ordered to give tylenol PRN for fever. Tylenol given.

## 2014-03-10 NOTE — Progress Notes (Signed)
Report given to receiving RN. Patient in bed resting. No verbal complaints and no signs or symptoms of distress or discomfort.  

## 2014-03-10 NOTE — Progress Notes (Signed)
INITIAL NUTRITION ASSESSMENT  DOCUMENTATION CODES Per approved criteria  -Obesity Unspecified   INTERVENTION: Encourage PO intake Provide Glucerna Shake once daily PRN  NUTRITION DIAGNOSIS: Predicted sub optimal energy intake related to varied appetite as evidenced by pt's report.   Goal: Pt to meet >/= 90% of their estimated nutrition needs   Monitor:  PO intake, weight trend, labs, I/O's  Reason for Assessment: Malnutrition Screening Tool, score of 3  80 y.o. female  Admitting Dx: Acute diastolic CHF (congestive heart failure)  ASSESSMENT: [name] with history of CHF, HTN, T2DM, COPD, TIA and CKD  who presents to the ED with SOB. She also has a several day history of cough productive of a whitish sputum. Today this is acutely worse. She is also noted to be very hypertensive in the ED today.  Per chart pt is non-ambulatory with R AKA.  Pt states that since January she has had a poor appetite off and on; she usually has a poor appetite a few days per week. She states she typically eats 3 meals daily but, when her appetite is poor she may only eat 1 or 2 meals. She denies weight loss stating 175 lbs is her usual weight. Per nursing notes pt ate 50% of breakfast this morning and pt reports that is normal.  Performed nutrition-focused physical exam. Pt appears well-nourished with no signs of muscle or fat wasting.  Labs: glucose ranging 111 to 132 mg/dL, high BUN, elevated creatinine, low GFR  Height: Ht Readings from Last 1 Encounters:  03/10/14 5\' 5"  (1.651 m)    Weight: Wt Readings from Last 1 Encounters:  03/10/14 198 lb 13.7 oz (90.2 kg)    Ideal Body Weight: 118 lbs  % Ideal Body Weight: 168%  Wt Readings from Last 10 Encounters:  03/10/14 198 lb 13.7 oz (90.2 kg)  04/28/13 175 lb (79.379 kg)  07/01/12 175 lb 7.8 oz (79.6 kg)  06/19/11 200 lb (90.719 kg)  05/31/11 200 lb (90.719 kg)    Usual Body Weight: 175 lbs  % Usual Body Weight: 113%  BMI:   Body mass index is 33.09 kg/(m^2). (Obese)  Estimated Nutritional Needs: Kcal: 1600-1800 Protein: 90-100 grams Fluid: 1.8 L/day  Skin: +1 LLE edema  Diet Order: Cardiac  EDUCATION NEEDS: -No education needs identified at this time   Intake/Output Summary (Last 24 hours) at 03/10/14 1045 Last data filed at 03/10/14 1025  Gross per 24 hour  Intake    120 ml  Output    300 ml  Net   -180 ml    Last BM: 4/27   Labs:   Recent Labs Lab 03/09/14 2013 03/10/14 0520  NA 145 144  K 5.0 4.7  CL 109 108  CO2 20 21  BUN 63* 64*  CREATININE 1.94* 1.95*  CALCIUM 9.8 9.2  GLUCOSE 125* 121*    CBG (last 3)   Recent Labs  03/10/14 0057 03/10/14 0603  GLUCAP 125* 132*    Scheduled Meds: . amLODipine  10 mg Oral Daily  . aspirin EC  81 mg Oral Daily  . carvedilol  6.25 mg Oral BID WC  . furosemide  40 mg Intravenous Q12H  . glimepiride  2 mg Oral QAC breakfast  . heparin  5,000 Units Subcutaneous 3 times per day  . hydrALAZINE  75 mg Oral TID  . nitroGLYCERIN  1 inch Topical 4 times per day  . sodium chloride  3 mL Intravenous Q12H    Continuous Infusions:  Past Medical History  Diagnosis Date  . DM (diabetes mellitus) type II controlled peripheral vascular disorder     A1C = 5.8  . HTN (hypertension)   . CHF (congestive heart failure)     EF = 75-17%, grade 2 diastolic dysfunction  . CKD (chronic kidney disease)   . CAD (coronary artery disease)   . COPD (chronic obstructive pulmonary disease)   . Cataracts, bilateral   . Hyperlipidemia   . Peripheral vascular disease bilateral    S/P femoral-popliteal bypass surgery  . Glaucoma   . TIA (transient ischemic attack)     Past Surgical History  Procedure Laterality Date  . Coronary artery bypass graft    . Spine surgery    . Eye surgery  cataract  . Carpal tunnel release  left  . Breast surgery  lumpectomy right  . Aortogram w/ runoff  06/06/11    right leg  . Above knee leg amputation  07/27/11     Right AKA    Pryor Ochoa RD, LDN Inpatient Clinical Dietitian Pager: [phone] After Hours Pager: [phone]

## 2014-03-10 NOTE — Evaluation (Signed)
Physical Therapy Evaluation Patient Details Name: [name] MRN: [id number] DOB: Nov 29, 1926 Today's Date: 03/10/2014   History of Present Illness  [name] is a 80 y.o. female who presents to the ED with SOB.  She also has a several day history of cough productive of a whitish sputum.  Today this is acutely worse.  She is also noted to be very hypertensive in the ED today.  When I asked the patient and family if she missed any BP meds today, they reply that actually yes she had just stopped her lasix, norvasc, and coreg today as it was on her med rec sheet from her last PCPs visit to DC these.  (Review of the sheet does indeed show that these were discontinued, presumably as duplicate meds on the list however as they are still on the med list under their generic names).  Clinical Impression  Pt admitted with/for CHF.  Pt currently limited functionally due to the problems listed below.  (see problems list.)  Pt will benefit from PT to maximize function and safety to be able to get home safely with available assist of family.     Follow Up Recommendations Home health PT;Supervision for mobility/OOB    Equipment Recommendations  None recommended by PT    Recommendations for Other Services       Precautions / Restrictions Precautions Precautions: Fall      Mobility  Bed Mobility Overal bed mobility: Needs Assistance Bed Mobility: Rolling;Sidelying to Sit;Sit to Supine Rolling: Min assist (rail) Sidelying to sit: Mod assist   Sit to supine: Mod assist   General bed mobility comments: moderate use of rails and truncal assist  Transfers Overall transfer level: Needs assistance Equipment used: None Transfers: Stand Pivot Transfers   Stand pivot transfers: Mod assist       General transfer comment: cues for safest technique, significant assist to come forward, pt tends to thrust backward trying to get up instead of coming forward.  Ambulation/Gait                 Stairs            Wheelchair Mobility    Modified Rankin (Stroke Patients Only)       Balance Overall balance assessment: Needs assistance Sitting-balance support: Feet supported;No upper extremity supported Sitting balance-Leahy Scale: Fair Sitting balance - Comments: sits unsupported EOB                                     Pertinent Vitals/Pain     Home Living Family/patient expects to be discharged to:: Private residence Living Arrangements: Other (Comment);Other relatives (grand daughter Adayah Arocho  RN) Available Help at Discharge: Family;Personal care attendant (PCA 5-6 hrs/day 7 days /week) Type of Home: House Home Access: Ramped entrance     Home Layout: One level Home Equipment: Wheelchair - manual      Prior Function Level of Independence: Needs assistance   Gait / Transfers Assistance Needed: non ambulatory  ADL's / Homemaking Assistance Needed: PCA assists        Hand Dominance        Extremity/Trunk Assessment               Lower Extremity Assessment: Generalized weakness         Communication   Communication: No difficulties  Cognition Arousal/Alertness: Awake/alert Behavior During Therapy: WFL for tasks assessed/performed Overall Cognitive Status:  Within Functional Limits for tasks assessed                      General Comments      Exercises        Assessment/Plan    PT Assessment Patient needs continued PT services  PT Diagnosis Generalized weakness   PT Problem List Decreased activity tolerance;Decreased balance;Decreased strength;Decreased mobility;Cardiopulmonary status limiting activity;Obesity  PT Treatment Interventions Functional mobility training;Therapeutic activities;Therapeutic exercise;Balance training;Patient/family education   PT Goals (Current goals can be found in the Care Plan section) Acute Rehab PT Goals Patient Stated Goal: I want to go to my home by myself.  We  settled on trying to work on transfers with as little asssit as possible. PT Goal Formulation: With patient Time For Goal Achievement: 03/24/14 Potential to Achieve Goals: Good    Frequency Min 3X/week   Barriers to discharge        Co-evaluation               End of Session Equipment Utilized During Treatment: Oxygen Activity Tolerance: Patient tolerated treatment well;Patient limited by fatigue Patient left: in bed;with call bell/phone within reach;with family/visitor present Nurse Communication: Mobility status         Time: 3244-0102 PT Time Calculation (min): 36 min   Charges:   PT Evaluation $Initial PT Evaluation Tier I: 1 Procedure PT Treatments $Therapeutic Activity: 23-37 mins   PT G CodesTessie Fass Sharilyn Geisinger 03/10/2014, 6:32 PM 03/10/2014  Donnella Sham, PT [phone]  (pager)

## 2014-03-10 NOTE — Progress Notes (Signed)
*  PRELIMINARY RESULTS* Echocardiogram 2D Echocardiogram has been performed.  [name] 03/10/2014, 10:52 AM

## 2014-03-11 ENCOUNTER — Inpatient Hospital Stay (HOSPITAL_COMMUNITY): Payer: Medicare Other

## 2014-03-11 ENCOUNTER — Telehealth: Payer: Self-pay | Admitting: *Deleted

## 2014-03-11 LAB — URINALYSIS, ROUTINE W REFLEX MICROSCOPIC
BILIRUBIN URINE: NEGATIVE
Glucose, UA: NEGATIVE mg/dL
Hgb urine dipstick: NEGATIVE
KETONES UR: NEGATIVE mg/dL
LEUKOCYTES UA: NEGATIVE
NITRITE: NEGATIVE
Protein, ur: NEGATIVE mg/dL
SPECIFIC GRAVITY, URINE: 1.014 (ref 1.005–1.030)
UROBILINOGEN UA: 0.2 mg/dL (ref 0.0–1.0)
pH: 5 (ref 5.0–8.0)

## 2014-03-11 LAB — CBC
HCT: 31.4 % — ABNORMAL LOW (ref 36.0–46.0)
Hemoglobin: 10.2 g/dL — ABNORMAL LOW (ref 12.0–15.0)
MCH: 23 pg — AB (ref 26.0–34.0)
MCHC: 32.5 g/dL (ref 30.0–36.0)
MCV: 70.9 fL — ABNORMAL LOW (ref 78.0–100.0)
Platelets: 171 10*3/uL (ref 150–400)
RBC: 4.43 MIL/uL (ref 3.87–5.11)
RDW: 18.9 % — ABNORMAL HIGH (ref 11.5–15.5)
WBC: 11.4 10*3/uL — ABNORMAL HIGH (ref 4.0–10.5)

## 2014-03-11 LAB — GLUCOSE, CAPILLARY
GLUCOSE-CAPILLARY: 151 mg/dL — AB (ref 70–99)
Glucose-Capillary: 51 mg/dL — ABNORMAL LOW (ref 70–99)
Glucose-Capillary: 92 mg/dL (ref 70–99)
Glucose-Capillary: 147 mg/dL — ABNORMAL HIGH (ref 70–99)
Glucose-Capillary: 78 mg/dL (ref 70–99)

## 2014-03-11 LAB — BASIC METABOLIC PANEL
BUN: 60 mg/dL — AB (ref 6–23)
CO2: 22 mEq/L (ref 19–32)
Calcium: 9 mg/dL (ref 8.4–10.5)
Chloride: 104 mEq/L (ref 96–112)
Creatinine, Ser: 1.91 mg/dL — ABNORMAL HIGH (ref 0.50–1.10)
GFR, EST AFRICAN AMERICAN: 27 mL/min — AB (ref >90)
GFR, EST NON AFRICAN AMERICAN: 23 mL/min — AB (ref >90)
GLUCOSE: 51 mg/dL — AB (ref 70–99)
Potassium: 4.5 mEq/L (ref 3.7–5.3)
Sodium: 143 mEq/L (ref 137–147)

## 2014-03-11 MED ORDER — NYSTATIN 100000 UNIT/GM EX OINT
TOPICAL_OINTMENT | Freq: Two times a day (BID) | CUTANEOUS | Status: DC
  Administered 2014-03-11 – 2014-03-13 (×5): via TOPICAL
  Filled 2014-03-11: qty 15

## 2014-03-11 MED ORDER — POLYETHYLENE GLYCOL 3350 17 G PO PACK
17.0000 g | PACK | Freq: Every day | ORAL | Status: DC
  Administered 2014-03-12 – 2014-03-13 (×2): 17 g via ORAL
  Filled 2014-03-11 (×3): qty 1

## 2014-03-11 MED ORDER — FLUCONAZOLE 100 MG PO TABS
100.0000 mg | ORAL_TABLET | Freq: Every day | ORAL | Status: DC
  Administered 2014-03-11 – 2014-03-13 (×3): 100 mg via ORAL
  Filled 2014-03-11 (×3): qty 1

## 2014-03-11 MED ORDER — CEPHALEXIN 500 MG PO CAPS
500.0000 mg | ORAL_CAPSULE | Freq: Two times a day (BID) | ORAL | Status: DC
  Administered 2014-03-11 – 2014-03-13 (×5): 500 mg via ORAL
  Filled 2014-03-11 (×6): qty 1

## 2014-03-11 MED ORDER — SULFAMETHOXAZOLE-TMP DS 800-160 MG PO TABS: 1.0000 | ORAL_TABLET | Freq: Two times a day (BID) | ORAL | Status: DC

## 2014-03-11 NOTE — Progress Notes (Signed)
Physical Therapy Treatment Patient Details Name: [name] MRN: [id number] DOB: 08/29/1927 Today's Date: 03/11/2014    History of Present Illness [name] is a 80 y.o. female who presents to the ED with SOB.  She also has a several day history of cough productive of a whitish sputum.  Today this is acutely worse.  She is also noted to be very hypertensive in the ED today.  When I asked the patient and family if she missed any BP meds today, they reply that actually yes she had just stopped her lasix, norvasc, and coreg today as it was on her med rec sheet from her last PCPs visit to [place] these.  (Review of the sheet does indeed show that these were discontinued, presumably as duplicate meds on the list however as they are still on the med list under their generic names).    PT Comments    Pt very pleasant & willing to participate in therapy.  Cont with current POC to maximize functional mobility prior to d/c home.     Follow Up Recommendations  Home health PT;Supervision for mobility/OOB     Equipment Recommendations  None recommended by PT    Recommendations for Other Services       Precautions / Restrictions Precautions Precautions: Fall Restrictions Weight Bearing Restrictions: No    Mobility  Bed Mobility Overal bed mobility: Needs Assistance Bed Mobility: Supine to Sit     Supine to sit: Mod assist;HOB elevated     General bed mobility comments: (A) to lift shoulders/trunk to sitting upright & use of draw pad to pivot hips around to EOB  Transfers Overall transfer level: Needs assistance   Transfers: Stand Pivot Transfers   Stand pivot transfers: +2 physical assistance;Max assist       General transfer comment: (A) to achieve standing, rotation of hips from bed>recliner, & controlled descent.  Pivoted to Lt side  Ambulation/Gait                 Stairs            Wheelchair Mobility    Modified Rankin (Stroke Patients Only)        Balance                                    Cognition Arousal/Alertness: Awake/alert Behavior During Therapy: WFL for tasks assessed/performed Overall Cognitive Status: Within Functional Limits for tasks assessed                      Exercises      General Comments        Pertinent Vitals/Pain No pain reported.      Home Living                      Prior Function            PT Goals (current goals can now be found in the care plan section) Acute Rehab PT Goals PT Goal Formulation: With patient Time For Goal Achievement: 03/24/14 Potential to Achieve Goals: Good Progress towards PT goals: Progressing toward goals    Frequency  Min 3X/week    PT Plan Current plan remains appropriate    Co-evaluation             End of Session Equipment Utilized During Treatment: Oxygen;Gait belt (2L 02 via [place]) Activity Tolerance: Patient  tolerated treatment well Patient left: in chair;with call bell/phone within reach     Time: 0809-0824 PT Time Calculation (min): 15 min  Charges:  $Therapeutic Activity: 8-22 mins                    G Codes:      Sena Hitch 03/20/2014, 3:24 PM   Sarajane Marek, PTA [phone] 2014-03-20

## 2014-03-11 NOTE — Telephone Encounter (Signed)
Call me back regarding my mother.  I attempted to return her call, left her a message for a return call.

## 2014-03-11 NOTE — Progress Notes (Signed)
Hypoglycemic Event  CBG: 51  Treatment: 15 GM carbohydrate snack  Symptoms: None  Follow-up CBG: Time:0646 CBG Result:92  Possible Reasons for Event: Inadequate meal intake and Unknown  Comments/MD notified:    Ronnette Hila  Remember to initiate Hypoglycemia Order Set & complete

## 2014-03-11 NOTE — Progress Notes (Addendum)
Report given to receiving RN. Patient in bed resting watching TV, with family at the bedside. No verbal complaints and no signs or symptoms of distress or discomfort. 

## 2014-03-11 NOTE — Progress Notes (Signed)
Note: This document was prepared with digital dictation and possible smart phrase technology. Any transcriptional errors that result from this process are unintentional.   [name] IWP:[id number] DOB: [birth year] DOA: 03/09/2014 PCP: Simona Huh, MD Dr. Justin Mend nephrology Dr. Wynonia Lawman cardiology  Brief narrative: 80 y/o ?, known h/o CHF followed by Dr. Wynonia Lawman, DM ty 2, CKD stage 3-4, S/p AKA, prior h/o breast ca, prior COPD, prior fem-pop bypass, CAD s/p CABG, prior TIA admitted to hospital 03/09/44 with decompensated likely diastolic HF in a setting of non-compliance. She stopped taking them as she wasn't aware they needed    Past medical history-As per Problem list Chart reviewed as below- reviewed  Consultants:  None currently  Procedures:  ECHO  Antibiotics:   none   Subjective  Doing well.  No apparent distress.  Seems stable with no fever or chills or n/v No abd pain Small stool today    Objective    Interim History: Noted borderline hypoglycemia as well as tmax 100.4  Telemetry: Sinus with PVC's   Objective: Filed Vitals:   03/10/14 2150 03/10/14 2245 03/11/14 0611 03/11/14 0924  BP: 160/80  165/70 165/57  Pulse:   74 69  Temp:  99.3 F (37.4 C) 98.6 F (37 C)   TempSrc:  Oral Oral   Resp:      Height:      Weight:   87.6 kg (193 lb 2 oz)   SpO2:   100%     Intake/Output Summary (Last 24 hours) at 03/11/14 1115 Last data filed at 03/11/14 0917  Gross per 24 hour  Intake    480 ml  Output    602 ml  Net   -122 ml    Exam:  General: eomi, ncat, mild JVD Cardiovascular: s1 s2 no m/r/g Respiratory: clear, no added sounds Abdomen:  No le edema on L sie.  R AKA noted Skinmild LLE edema Neuroi ntact  Data Reviewed: Basic Metabolic Panel:  Recent Labs Lab 03/09/14 2013 03/10/14 0520 03/11/14 0341  NA 145 144 143  K 5.0 4.7 4.5  CL 109 108 104  CO2 20 21 22   GLUCOSE 125* 121* 51*  BUN 63* 64* 60*  CREATININE 1.94* 1.95*  1.91*  CALCIUM 9.8 9.2 9.0   Liver Function Tests: No results found for this basename: AST, ALT, ALKPHOS, BILITOT, PROT, ALBUMIN,  in the last 168 hours No results found for this basename: LIPASE, AMYLASE,  in the last 168 hours No results found for this basename: AMMONIA,  in the last 168 hours CBC:  Recent Labs Lab 03/09/14 2013 03/11/14 0341  WBC 12.4* 11.4*  HGB 11.7* 10.2*  HCT 36.6 31.4*  MCV 72.3* 70.9*  PLT 205 171   Cardiac Enzymes: No results found for this basename: CKTOTAL, CKMB, CKMBINDEX, TROPONINI,  in the last 168 hours BNP: No components found with this basename: POCBNP,  CBG:  Recent Labs Lab 03/10/14 1041 03/10/14 1637 03/10/14 2115 03/11/14 0619 03/11/14 0646  GLUCAP 111* 110* 151* 51* 92    No results found for this or any previous visit (from the past 240 hour(s)).   Studies:              All Imaging reviewed and is as per above notation   Scheduled Meds: . amLODipine  10 mg Oral Daily  . aspirin EC  81 mg Oral Daily  . carvedilol  6.25 mg Oral BID WC  . citalopram  20 mg Oral Daily  .  ezetimibe  10 mg Oral Daily  . furosemide  40 mg Intravenous Q12H  . heparin  5,000 Units Subcutaneous 3 times per day  . hydrALAZINE  100 mg Oral TID  . iron polysaccharides  150 mg Oral BID  . polyethylene glycol  17 g Oral Daily  . sodium chloride  3 mL Intravenous Q12H  . sulfamethoxazole-trimethoprim  1 tablet Oral Q12H   Continuous Infusions:    Assessment/Plan:                                                                             1. Low grade fever-? Pyleonephritis vs PNA-however 2 vw CXR 4/29 neg.  Start Empiric Keflex 500 bid 2. Acute decompensated likely Diastolic HF-continue lasix Iv 40 mg bid-transitioned to Lasix 40 PO bid.  Keep foley in situ and diurese with Oral lasix. Will also continue Coreg 6.25 po bid, hydralazine 75 tid increased to 100 tid.   3. Htn urgency-increase Hydralazine as above.  Hold Labetalol.  Continue Amlodipine  10 daily  Poor control 2/2 to non-understanding of meds.  Educated 4. DM ty 2-Hold Amaryl for now.  Check only am sugars-will give qhs snack as was mildly hypoglycemic. Will consdier witholding oral hypoglycemics permanently 5. AKI superimposed on CKD 3-4-patient was probably held off of some Htn meds due to worsening labs.  monitor daily with labs.   6. Leukocytosis-monitor.  Mild temp overnight 100.4.  Leukocytosis still +.  Would ge tUa.  CXr 4/29 neg for infiltrate 7. Depression Celexa 20 daily 8. Rash in stomach-added fluconazole and will give nystatin as well 9. HLD-continue Zetia 10 daily 10. Likely Iron deficiency aenmia-monitor.  contininue Iron replacement  Code Status: Full Family Communication:  Discussed with ? # fr HCPOA Hubert Azure on [phone] [c] Disposition Plan:  Inpatient likely 2-3 days  Verneita Griffes, MD  [facility] Pager [phone] 03/11/2014, 11:15 AM    LOS: 2 days

## 2014-03-11 NOTE — Progress Notes (Signed)
ANTIBIOTIC CONSULT NOTE - INITIAL  Pharmacy Consult for fluconazole Indication: rash on stomach  Allergies  Allergen Reactions  . Codeine Other (See Comments)    REACTION: unknown  . Omnipaque [Iohexol] Other (See Comments)    REACTION: unknown  . Celebrex [Celecoxib] Rash  . Iodine Rash    Patient Measurements: Height: 5\' 5"  (165.1 cm) Weight: 193 lb 2 oz (87.6 kg) IBW/kg (Calculated) : 57  Vital Signs: Temp: 98.6 F (37 C) (04/29 5361) Temp src: Oral (04/29 0611) BP: 165/57 mmHg (04/29 0924) Pulse Rate: 69 (04/29 0924) Intake/Output from previous day: 04/28 0701 - 04/29 0700 In: 360 [P.O.:360] Out: 901 [Urine:900; Stool:1] Intake/Output from this shift: Total I/O In: 240 [P.O.:240] Out: 1 [Stool:1]  Labs:  Recent Labs  03/09/14 2013 03/10/14 0520 03/11/14 0341  WBC 12.4*  --  11.4*  HGB 11.7*  --  10.2*  PLT 205  --  171  CREATININE 1.94* 1.95* 1.91*   Estimated Creatinine Clearance: 24 ml/min (by C-G formula based on Cr of 1.91). No results found for this basename: VANCOTROUGH, VANCOPEAK, VANCORANDOM, GENTTROUGH, GENTPEAK, GENTRANDOM, TOBRATROUGH, TOBRAPEAK, TOBRARND, AMIKACINPEAK, AMIKACINTROU, AMIKACIN,  in the last 72 hours   Microbiology: No results found for this or any previous visit (from the past 720 hour(s)).  Medical History: Past Medical History  Diagnosis Date  . DM (diabetes mellitus) type II controlled peripheral vascular disorder     A1C = 5.8  . HTN (hypertension)   . CHF (congestive heart failure)     EF = 44-31%, grade 2 diastolic dysfunction  . CKD (chronic kidney disease)   . CAD (coronary artery disease)   . COPD (chronic obstructive pulmonary disease)   . Cataracts, bilateral   . Hyperlipidemia   . Peripheral vascular disease bilateral    S/P femoral-popliteal bypass surgery  . Glaucoma   . TIA (transient ischemic attack)     Medications:  Prescriptions prior to admission  Medication Sig Dispense Refill  . allopurinol  (ZYLOPRIM) 100 MG tablet Take 100 mg by mouth daily.      Marland Kitchen amLODipine (NORVASC) 10 MG tablet Take 10 mg by mouth daily.      . B Complex-C-Folic Acid (VOL-CARE RX PO) Take 1 tablet by mouth daily.      . carvedilol (COREG) 6.25 MG tablet Take 6.25 mg by mouth 2 (two) times daily with a meal.      . citalopram (CELEXA) 10 MG tablet Take 20 mg by mouth daily.       . cloNIDine (CATAPRES) 0.1 MG tablet Take 0.1 mg by mouth 2 (two) times daily.      Marland Kitchen ezetimibe (ZETIA) 10 MG tablet Take 10 mg by mouth daily.      . famotidine (PEPCID) 20 MG tablet Take 20 mg by mouth daily.      . furosemide (LASIX) 20 MG tablet Take 20 mg by mouth 2 (two) times daily.      . hydrALAZINE (APRESOLINE) 100 MG tablet Take 100 mg by mouth 3 (three) times daily.      . iron polysaccharides (NIFEREX) 150 MG capsule Take 150 mg by mouth 2 (two) times daily.      . isosorbide mononitrate (IMDUR) 120 MG 24 hr tablet Take 120 mg by mouth daily.      Marland Kitchen nystatin cream (MYCOSTATIN) Apply 1 application topically 2 (two) times daily.      Marland Kitchen oxyCODONE-acetaminophen (PERCOCET/ROXICET) 5-325 MG per tablet Take 1 tablet by mouth every 6 (six) hours  as needed for moderate pain.      . [DISCONTINUED] vancomycin (VANCOCIN) 50 mg/mL oral solution Take 2.5 mLs (125 mg total) by mouth 4 (four) times daily.  100 mL  0   Assessment: 80 y/o female admitted with SOB on 4/27 found to have CHF exacerbation in setting of noncompliance. Pharmacy consulted to begin fluconazole for stomach rash. She is also being treated with Keflex for pyelo vs PNA. She has CKD with a stable SCr around 1.9's.   Goal of Therapy:  Eradication of infection  Plan:  - Fluconazole 100 mg PO daily - consider 10-14 days of therapy - Pharmacy signing off - please re-consult if needed  [facility], Lolo.D., BCPS Clinical Pharmacist Pager: [phone] 03/11/2014 11:32 AM

## 2014-03-12 LAB — COMPREHENSIVE METABOLIC PANEL
ALT: 17 U/L (ref 0–35)
AST: 20 U/L (ref 0–37)
Albumin: 3.1 g/dL — ABNORMAL LOW (ref 3.5–5.2)
Alkaline Phosphatase: 75 U/L (ref 39–117)
BUN: 57 mg/dL — AB (ref 6–23)
CALCIUM: 9.1 mg/dL (ref 8.4–10.5)
CO2: 23 meq/L (ref 19–32)
CREATININE: 1.81 mg/dL — AB (ref 0.50–1.10)
Chloride: 100 mEq/L (ref 96–112)
GFR, EST AFRICAN AMERICAN: 28 mL/min — AB (ref >90)
GFR, EST NON AFRICAN AMERICAN: 24 mL/min — AB (ref >90)
Glucose, Bld: 142 mg/dL — ABNORMAL HIGH (ref 70–99)
Potassium: 4.5 mEq/L (ref 3.7–5.3)
Sodium: 139 mEq/L (ref 137–147)
TOTAL PROTEIN: 6.9 g/dL (ref 6.0–8.3)
Total Bilirubin: 0.5 mg/dL (ref 0.3–1.2)

## 2014-03-12 LAB — CBC WITH DIFFERENTIAL/PLATELET
BASOS PCT: 0 % (ref 0–1)
Basophils Absolute: 0 10*3/uL (ref 0.0–0.1)
EOS ABS: 0.3 10*3/uL (ref 0.0–0.7)
EOS PCT: 3 % (ref 0–5)
HCT: 33.6 % — ABNORMAL LOW (ref 36.0–46.0)
HEMOGLOBIN: 11.3 g/dL — AB (ref 12.0–15.0)
LYMPHS ABS: 0.8 10*3/uL (ref 0.7–4.0)
Lymphocytes Relative: 9 % — ABNORMAL LOW (ref 12–46)
MCH: 23.6 pg — ABNORMAL LOW (ref 26.0–34.0)
MCHC: 33.6 g/dL (ref 30.0–36.0)
MCV: 70.1 fL — AB (ref 78.0–100.0)
Monocytes Absolute: 0.5 10*3/uL (ref 0.1–1.0)
Monocytes Relative: 5 % (ref 3–12)
NEUTROS ABS: 7.5 10*3/uL (ref 1.7–7.7)
NEUTROS PCT: 83 % — AB (ref 43–77)
Platelets: 243 10*3/uL (ref 150–400)
RBC: 4.79 MIL/uL (ref 3.87–5.11)
RDW: 19 % — ABNORMAL HIGH (ref 11.5–15.5)
WBC: 9.1 10*3/uL (ref 4.0–10.5)

## 2014-03-12 LAB — GLUCOSE, CAPILLARY
GLUCOSE-CAPILLARY: 151 mg/dL — AB (ref 70–99)
GLUCOSE-CAPILLARY: 172 mg/dL — AB (ref 70–99)
Glucose-Capillary: 81 mg/dL (ref 70–99)
Glucose-Capillary: 100 mg/dL — ABNORMAL HIGH (ref 70–99)

## 2014-03-12 NOTE — Telephone Encounter (Signed)
I attempted to call her again, left a message for a return call.

## 2014-03-12 NOTE — Progress Notes (Signed)
Note: This document was prepared with digital dictation and possible smart phrase technology. Any transcriptional errors that result from this process are unintentional.   HOORAIN KOZAKIEWICZ ATF:[id number] DOB: [birth year] DOA: 03/09/2014 PCP: Simona Huh, MD Dr. Justin Mend nephrology Dr. Wynonia Lawman cardiology  Brief narrative: 80 y/o ?, known h/o CHF followed by Dr. Wynonia Lawman, DM ty 2, CKD stage 3-4, S/p AKA, prior h/o breast ca, prior COPD, prior fem-pop bypass, CAD s/p CABG, prior TIA admitted to hospital 03/09/44 with decompensated likely diastolic HF in a setting of non-compliance. She stopped taking them as she wasn't aware they were needed and there was some confusion with regards to which medications to take 1 in addition her granddaughter who is a nurse who is out of town and patient name his medications because of non-understanding of medication use   Past medical history-As per Problem list Chart reviewed as below- reviewed  Consultants:  None currently  Procedures:  ECHO  Antibiotics:   none   Subjective  Doing well.  No shortness of breath or chest pain tolerating diet No blurred vision No fever or chills but still producing some sputum    Objective    Interim History: Noted borderline hypoglycemia as well as tmax 100.4  Telemetry: Sinus with PVC's   Objective: Filed Vitals:   03/11/14 1723 03/11/14 2019 03/12/14 0521 03/12/14 0611  BP: 158/59 155/55 167/59   Pulse: 67 65 72 70  Temp:  99.5 F (37.5 C) 98 F (36.7 C)   TempSrc:  Oral Oral   Resp:  18 18   Height:      Weight:   85.866 kg (189 lb 4.8 oz)   SpO2:  92% 100%     Intake/Output Summary (Last 24 hours) at 03/12/14 1308 Last data filed at 03/12/14 1142  Gross per 24 hour  Intake   1040 ml  Output   2300 ml  Net  -1260 ml    Exam:  General: eomi, ncat, JVD flat Cardiovascular: s1 s2 no m/r/g Respiratory: clear, no added sounds Abdomen:  No le edema on L sie.  R AKA noted Skinmild LLE  edema Neuroi ntact  Data Reviewed: Basic Metabolic Panel:  Recent Labs Lab 03/09/14 2013 03/10/14 0520 03/11/14 0341 03/12/14 0838  NA 145 144 143 139  K 5.0 4.7 4.5 4.5  CL 109 108 104 100  CO2 20 21 22 23   GLUCOSE 125* 121* 51* 142*  BUN 63* 64* 60* 57*  CREATININE 1.94* 1.95* 1.91* 1.81*  CALCIUM 9.8 9.2 9.0 9.1   Liver Function Tests:  Recent Labs Lab 03/12/14 0838  AST 20  ALT 17  ALKPHOS 75  BILITOT 0.5  PROT 6.9  ALBUMIN 3.1*   No results found for this basename: LIPASE, AMYLASE,  in the last 168 hours No results found for this basename: AMMONIA,  in the last 168 hours CBC:  Recent Labs Lab 03/09/14 2013 03/11/14 0341 03/12/14 0838  WBC 12.4* 11.4* 9.1  NEUTROABS  --   --  7.5  HGB 11.7* 10.2* 11.3*  HCT 36.6 31.4* 33.6*  MCV 72.3* 70.9* 70.1*  PLT 205 171 243   Cardiac Enzymes: No results found for this basename: CKTOTAL, CKMB, CKMBINDEX, TROPONINI,  in the last 168 hours BNP: No components found with this basename: POCBNP,  CBG:  Recent Labs Lab 03/11/14 1114 03/11/14 1633 03/11/14 2106 03/12/14 0539 03/12/14 1049  GLUCAP 147* 78 151* 81 151*    No results found for this or any previous  visit (from the past 240 hour(s)).   Studies:              All Imaging reviewed and is as per above notation   Scheduled Meds: . amLODipine  10 mg Oral Daily  . aspirin EC  81 mg Oral Daily  . carvedilol  6.25 mg Oral BID WC  . cephALEXin  500 mg Oral Q12H  . citalopram  20 mg Oral Daily  . ezetimibe  10 mg Oral Daily  . fluconazole  100 mg Oral Daily  . furosemide  40 mg Intravenous Q12H  . heparin  5,000 Units Subcutaneous 3 times per day  . hydrALAZINE  100 mg Oral TID  . iron polysaccharides  150 mg Oral BID  . nystatin ointment   Topical BID  . polyethylene glycol  17 g Oral Daily  . sodium chloride  3 mL Intravenous Q12H   Continuous Infusions:    Assessment/Plan:                                                                              1. Low grade fever-? Pyleonephritis vs PNA-however 2 vw CXR 4/29 neg.  Start Empiric Keflex 500 bid-stop date 03/15/49 2. Acute decompensated likely Diastolic HF-continue lasix Iv 40 mg bid-transitioned to Lasix 40 PO bid.  Will also continue Coreg 6.25 po bid, hydralazine 75 tid increased to 100 tid 03/11/14 3. Htn urgency-increase Hydralazine as above.  Hold Labetalol.  Continue Amlodipine 10 daily  Poor control 2/2 to non-understanding of meds.  Educated pt and family. 4. DM ty 2, with hypoglycemia during this admission-now resolved-Hold Amaryl for now.  Continue qhs snack as was mildly hypoglycemic. Will consider witholding oral hypoglycemics permanently 5. AKI superimposed on CKD 3-4-patient was probably held off of some Htn meds due to worsening labs.  improving.   6. Leukocytosis-monitor.  Mild temp overnight 100.4.  Leukocytosis still +.  Would get Ua.  CXr 4/29 neg for infiltrate 7. Depression-Celexa 20 daily 8. Rash in stomach-added fluconazole and will give nystatin as well 9. HLD-continue Zetia 10 daily 10. Likely Iron deficiency aenmia-monitor.  Continue Iron replacement  Code Status: Full Family Communication:  Discussed with ? # fr HCPOA Laporsha Grealish on [phone] [c], 03/12/14 Disposition Plan:  Inpatient likely d/c home 4/31  Verneita Griffes, MD  [facility] Pager [phone] 03/12/2014, 1:08 PM    LOS: 3 days

## 2014-03-12 NOTE — Consult Note (Addendum)
Heart Failure Navigator Consult Note  Presentation: [name] is a 80 y.o. female who presents to the ED with SOB. She also has a several day history of cough productive of a whitish sputum. Today this is acutely worse. She is also noted to be very hypertensive in the ED today. When I asked the patient and family if she missed any BP meds today, they reply that actually yes she had just stopped her lasix, norvasc, and coreg today as it was on her med rec sheet from her last PCPs visit to [place] these. (Review of the sheet does indeed show that these were discontinued, presumably as duplicate meds on the list however as they are still on the med list under their generic names).  Unsurprisingly she is found to be in CHF.   Past Medical History  Diagnosis Date  . DM (diabetes mellitus) type II controlled peripheral vascular disorder     A1C = 5.8  . HTN (hypertension)   . CHF (congestive heart failure)     EF = 37-16%, grade 2 diastolic dysfunction  . CKD (chronic kidney disease)   . CAD (coronary artery disease)   . COPD (chronic obstructive pulmonary disease)   . Cataracts, bilateral   . Hyperlipidemia   . Peripheral vascular disease bilateral    S/P femoral-popliteal bypass surgery  . Glaucoma   . TIA (transient ischemic attack)     History   Social History  . Marital Status: Widowed    Spouse Name: N/A    Number of Children: N/A  . Years of Education: N/A   Social History Main Topics  . Smoking status: Former Smoker -- 1.00 packs/day for 40 years    Types: Cigarettes    Quit date: 04/13/1977  . Smokeless tobacco: Never Used  . Alcohol Use: No  . Drug Use: No  . Sexual Activity: No   Other Topics Concern  . None   Social History Narrative  . None    ECHO:Study Conclusions--03/10/14  - Left ventricle: The cavity size was normal. There was mild concentric hypertrophy. Systolic function was normal. The estimated ejection fraction was in the range of 60% to 65%. Wall  motion was normal; there were no regional wall motion abnormalities. Doppler parameters are consistent with abnormal left ventricular relaxation (grade 1 diastolic dysfunction). - Mitral valve: Poorly visualized. Mild regurgitation. - Left atrium: The atrium was at the upper limits of normal in size. - Right atrium: The atrium was mildly dilated. - Atrial septum: No defect or patent foramen ovale was identified. - Inferior vena cava: The vessel was dilated; the respirophasic diameter changes were blunted (< 50%); findings are consistent with elevated central venous pressure. - Pericardium, extracardiac: There was no pericardial effusion.    BNP    Component Value Date/Time   PROBNP 4610.0* 03/09/2014 2013    Education Assessment and Provision:  Detailed education and instructions provided on heart failure disease management including the following:  Signs and symptoms of Heart Failure When to call the physician Importance of daily weights Low sodium diet  Fluid restriction Medication management Anticipated future follow-up appointments  Patient education given on each of the above topics.  [name] is a pleasant elderly female.  She currently lives with her granddaughter [name]) who works here in the hospital as a Marine scientist.  Her granddaughter has been out of town and [name] daughters having been helping her lately.  There was a mix up with her medications and unfortunately she  quit taking some of them including her lasix.  She has very limited insight into her health and diagnosis of HF.  She is not able to stand on the scale for daily weights.  She says that she has someone (caregiver) that cooks for her some days, however "she does not cook like me" and admits that often she does not eat what is prepared.  Her dietary recall consists of oatmeal, boiled egg and peanut butter sandwiches.   Education Materials:  "Living Better With Heart Failure" Booklet, Daily Weight  Tracker Tool and Heart Failure Educational Video.   High Risk Criteria for Readmission and/or Poor Patient Outcomes:   EF <30%- WG-95-62% -Diastolic dysfunction  2 or more admissions in 6 months- No  Difficult social situation- No  Demonstrates medication noncompliance- Yes   Barriers of Care:  Knowledge of medical condition and ability to be compliant.  Discharge Planning:   Plans to discharge to home with granddaughter.  Granddaughter works 12 hour shifts here at the hospital.  She would definitely benefit from [facility] and [facility].

## 2014-03-12 NOTE — Progress Notes (Signed)
Patient evaluated for community based chronic disease management services with [facility] as a benefit of patient's [employer]. Spoke with patient at bedside to explain [facility] services.  Verbal consent obtained for services.  She indicated that her grand daughter Mariateresa Batra RN on [address] is her authorized contact [phone].  Patient may benefit from CHF acute symptom management teaching.  Has a CNA at her home from [place] M-F and 9am-2pm Saturday and Sunday from [place].  Her scale is broken and she can not safely stand on it.  Patient will receive a post discharge transition of care call and will be evaluated for monthly home visits for assessments and disease process education.  Left contact information and [facility] literature at bedside. Made Inpatient Case Manager aware that [facility] Management following. Of note, [facility] Care Management services does not replace or interfere with any services that are arranged by inpatient case management or social work.  For additional questions or referrals please contact Corliss Blacker BSN RN [facility] Liaison at [phone].

## 2014-03-13 LAB — GLUCOSE, CAPILLARY
Glucose-Capillary: 94 mg/dL (ref 70–99)
Glucose-Capillary: 197 mg/dL — ABNORMAL HIGH (ref 70–99)

## 2014-03-13 MED ORDER — CEPHALEXIN 500 MG PO CAPS: 500.0000 mg | ORAL_CAPSULE | Freq: Two times a day (BID) | ORAL | Status: DC

## 2014-03-13 MED ORDER — ISOSORBIDE MONONITRATE ER 60 MG PO TB24: 60.0000 mg | ORAL_TABLET | Freq: Every day | ORAL | Status: DC

## 2014-03-13 MED ORDER — FLUCONAZOLE 100 MG PO TABS: 100.0000 mg | ORAL_TABLET | Freq: Every day | ORAL | Status: DC

## 2014-03-13 MED ORDER — FUROSEMIDE 20 MG PO TABS: 40.0000 mg | ORAL_TABLET | Freq: Two times a day (BID) | ORAL | Status: DC

## 2014-03-13 MED ORDER — POLYETHYLENE GLYCOL 3350 17 G PO PACK: 17.0000 g | PACK | Freq: Every day | ORAL | Status: DC

## 2014-03-13 NOTE — Discharge Summary (Addendum)
Physician Discharge Summary  MY RINKE IDP:[id number] DOB: 18-Apr-1927 DOA: 03/09/2014  PCP: Simona Huh, MD  Admit date: 03/09/2014 Discharge date: 03/13/2014  Time spent: 40 minutes  Recommendations for Outpatient Follow-up:  1. Recommend close followup with primary care physician to determine the best followup needs and medication adjustment-ideally one physician should change her medications at the time  2. Recommend lab work in about one week-basic metabolic panel, CBC 3. Home health RN to assist with management and home health therapy has been ordered 4. Recommended daily weights on discharge   Discharge Diagnoses:  Principal Problem:   Acute diastolic CHF (congestive heart failure) Active Problems:   DM2 (diabetes mellitus, type 2)   CKD (chronic kidney disease), stage III   HTN (hypertension)   Discharge Condition: improved  Diet recommendation: heart healthy low-salt  Filed Weights   03/11/14 0611 03/12/14 0521 03/13/14 0410  Weight: 87.6 kg (193 lb 2 oz) 85.866 kg (189 lb 4.8 oz) 83.4 kg (183 lb 13.8 oz)    History of present illness:  80 y/o ?, known h/o CHF followed by Dr. Wynonia Lawman, DM ty 2, CKD stage 3-4, S/p AKA, prior h/o breast ca, prior COPD, prior fem-pop bypass, CAD s/p CABG, prior TIA admitted to hospital 03/09/77 with decompensated likely diastolic HF in a setting of non-compliance.  She stopped taking them as she wasn't aware they were needed and there was some confusion with regards to which medications to take 1 in addition her granddaughter who is a nurse who is out of town which might have contributed to patient's lack of compliance   Hospital Course:   1. Low grade fever-? Pyleonephritis as 2 vw CXR 4/29 neg. Start Empiric Keflex 500 bid-stop date 03/15/14 2. Acute decompensated  Diastolic HF-continue lasix Iv 40 mg bid-transitioned to Lasix 40 PO bid. Will also continue Coreg 6.25 po bid, hydralazine 75 tid increased to 100 tid 03/11/14 3. Htn  urgency-increase Hydralazine as above. Hold Labetalol. Continue Amlodipine 10 daily Poor control 2/2 to non-understanding of meds. Educated pt and family.  reimplemented on discharge Imdur 60 mg. Given her creatinine she has a relative contraindication to ACE inhibitor but this can be tried cautiously the outpatient setting once labs are 4. DM ty 2, with hypoglycemia during this admission-now resolved-encourage nightly snack 5. AKI superimposed on CKD 3-4-patient was probably held off of some Htn meds due to worsening labs. Improving-will need repeat labs in about one week at home to be reported by the nephrologist or primary care physician. She follows with Dr. Justin Mend of nephrology for stage III kidney disease and may need dialysis in the future 6. Leukocytosis-monitor-had low-grade temperature 100.4 which has resolved continue Keflex as above. CXr 4/29 neg for infiltrate 7. Depression-Celexa 20 daily 8. Rash in stomach-added fluconazole on 4/29 and she has intertriginousinfection and will give nystatin as well-this will continue for 9. HLD-continue Zetia 10 daily 10. Likely Iron deficiency aenmia-monitor. Continue Iron replacement  should follow up-for consideration of further Procrit injections  Consultants:  None currently Procedures:  ECHO-Study Conclusions  - Left ventricle: The cavity size was normal. There was mild concentric hypertrophy. Systolic function was normal. The estimated ejection fraction was in the range of 60% to 65%. Wall motion was normal; there were no regional wall motion abnormalities. Doppler parameters are consistent with abnormal left ventricular relaxation (grade 1 diastolic dysfunction). - Mitral valve: Poorly visualized. Mild regurgitation. - Left atrium: The atrium was at the upper limits of normal in size. - Right  atrium: The atrium was mildly dilated. - Atrial septum: No defect or patent foramen ovale was identified. - Inferior vena cava: The vessel was  dilated; the respirophasic diameter changes were blunted (< 50%); findings are consistent with elevated central venous pressure. - Pericardium, extracardiac: There was no pericardial Effusion.  Antibiotics:  none  Discharge Exam: Filed Vitals:   03/13/14 0410  BP: 161/46  Pulse: 68  Temp: 99.1 F (37.3 C)  Resp: 19   Alert pleasant oriented no apparent distress tolerating diet General: EOMI, NCAT cardio S1-S2 no murmur rub or gallop Chest clinically clear no added  Discharge Instructions You were cared for by a hospitalist during your hospital stay. If you have any questions about your discharge medications or the care you received while you were in the hospital after you are discharged, you can call the unit and asked to speak with the hospitalist on call if the hospitalist that took care of you is not available. Once you are discharged, your primary care physician will handle any further medical issues. Please note that NO REFILLS for any discharge medications will be authorized once you are discharged, as it is imperative that you return to your primary care physician (or establish a relationship with a primary care physician if you do not have one) for your aftercare needs so that they can reassess your need for medications and monitor your lab values.  Discharge Orders   Future Appointments Provider Department Dept Phone   03/19/2014 11:00 AM [facility] [place] [phone]   05/04/2014 1:00 PM [facility] [place] [facility] [address] [phone]   05/04/2014 2:00 PM [facility] [address] [id number]   05/04/2014 2:40 PM Sharmon Leyden Nickel, NP [facility] [phone]   07/06/2014 10:45 AM Wallene Huh, [place] at [place]   Future Orders Complete By Expires   Contraindication to ACEI at discharge  As directed    Diet - low sodium  heart healthy  As directed    Discharge instructions  As directed    Increase activity slowly  As directed    STOP any activity that causes chest pain, shortness of breath, dizziness, sweating, or exessive weakness  As directed        Medication List    STOP taking these medications       cloNIDine 0.1 MG tablet  Commonly known as:  CATAPRES     vancomycin 50 mg/mL oral solution  Commonly known as:  VANCOCIN      TAKE these medications       allopurinol 100 MG tablet  Commonly known as:  ZYLOPRIM  Take 100 mg by mouth daily.     amLODipine 10 MG tablet  Commonly known as:  NORVASC  Take 10 mg by mouth daily.     carvedilol 6.25 MG tablet  Commonly known as:  COREG  Take 6.25 mg by mouth 2 (two) times daily with a meal.     cephALEXin 500 MG capsule  Commonly known as:  KEFLEX  Take 1 capsule (500 mg total) by mouth every 12 (twelve) hours.     citalopram 10 MG tablet  Commonly known as:  CELEXA  Take 20 mg by mouth daily.     ezetimibe 10 MG tablet  Commonly known as:  ZETIA  Take 10 mg by mouth daily.     famotidine 20 MG tablet  Commonly known  as:  PEPCID  Take 20 mg by mouth daily.     fluconazole 100 MG tablet  Commonly known as:  DIFLUCAN  Take 1 tablet (100 mg total) by mouth daily.     furosemide 20 MG tablet  Commonly known as:  LASIX  Take 2 tablets (40 mg total) by mouth 2 (two) times daily.     hydrALAZINE 100 MG tablet  Commonly known as:  APRESOLINE  Take 100 mg by mouth 3 (three) times daily.     iron polysaccharides 150 MG capsule  Commonly known as:  NIFEREX  Take 150 mg by mouth 2 (two) times daily.     isosorbide mononitrate 60 MG 24 hr tablet  Commonly known as:  IMDUR  Take 1 tablet (60 mg total) by mouth daily.     nystatin cream  Commonly known as:  MYCOSTATIN  Apply 1 application topically 2 (two) times daily.     oxyCODONE-acetaminophen 5-325 MG per tablet  Commonly known as:  PERCOCET/ROXICET  Take 1 tablet by mouth  every 6 (six) hours as needed for moderate pain.     polyethylene glycol packet  Commonly known as:  MIRALAX / GLYCOLAX  Take 17 g by mouth daily.     VOL-CARE RX PO  Take 1 tablet by mouth daily.       Allergies  Allergen Reactions  . Codeine Other (See Comments)    REACTION: unknown  . Omnipaque [Iohexol] Other (See Comments)    REACTION: unknown  . Celebrex [Celecoxib] Rash  . Iodine Rash       Follow-up Information   Follow up with [facility]. (RN/PT)    Contact information:   [address] [place] [address] [phone]        The results of significant diagnostics from this hospitalization (including imaging, microbiology, ancillary and laboratory) are listed below for reference.    Significant Diagnostic Studies: Dg Chest 2 View  03/11/2014   CLINICAL DATA:  Shortness of breath.  Pneumonia.  EXAM: CHEST  2 VIEW  COMPARISON:  DG CHEST 1V PORT dated 03/09/2014  FINDINGS: Cardiomegaly. Prior CABG. Pulmonary vascular prominence and bilateral pulmonary alveolar infiltrates consistent pulmonary edema remain with improved slightly. Small left pleural effusion cannot be excluded. No pneumothorax. Degenerative changes both shoulders. Surgical clips right axilla.  IMPRESSION: Congestive heart failure with pulmonary edema and small pleural left pleural effusion. Pulmonary edema has improved just slightly from prior exam.   Electronically Signed   By: [place]   On: 03/11/2014 08:24   Dg Chest Port 1 View  03/09/2014   CLINICAL DATA:  Chest pain.  EXAM: PORTABLE CHEST - 1 VIEW  COMPARISON:  Chest radiograph performed 01/03/2013  FINDINGS: The lungs are relatively well expanded. Vascular congestion is noted, with diffusely increased interstitial markings, worse on the left. This may reflect pulmonary edema, multifocal pneumonia or less likely underlying interstitial lung disease. A small left pleural effusion is suspected. No pneumothorax is  seen.  The cardiomediastinal silhouette is mildly enlarged. The patient is status post median sternotomy, with evidence of prior CABG. No acute osseous abnormalities are identified. Clips are seen overlying the right axilla.  IMPRESSION: Vascular congestion and mild cardiomegaly, with diffusely increased interstitial markings, worse on the left. This may reflect asymmetric pulmonary edema, multifocal pneumonia or less likely underlying interstitial lung disease. Suspect small left pleural effusion.   Electronically Signed   By: Garald Balding M.D.   On: 03/09/2014 21:30  Microbiology: No results found for this or any previous visit (from the past 240 hour(s)).   Labs: Basic Metabolic Panel:  Recent Labs Lab 03/09/14 2013 03/10/14 0520 03/11/14 0341 03/12/14 0838  NA 145 144 143 139  K 5.0 4.7 4.5 4.5  CL 109 108 104 100  CO2 20 21 22 23   GLUCOSE 125* 121* 51* 142*  BUN 63* 64* 60* 57*  CREATININE 1.94* 1.95* 1.91* 1.81*  CALCIUM 9.8 9.2 9.0 9.1   Liver Function Tests:  Recent Labs Lab 03/12/14 0838  AST 20  ALT 17  ALKPHOS 75  BILITOT 0.5  PROT 6.9  ALBUMIN 3.1*   No results found for this basename: LIPASE, AMYLASE,  in the last 168 hours No results found for this basename: AMMONIA,  in the last 168 hours CBC:  Recent Labs Lab 03/09/14 2013 03/11/14 0341 03/12/14 0838  WBC 12.4* 11.4* 9.1  NEUTROABS  --   --  7.5  HGB 11.7* 10.2* 11.3*  HCT 36.6 31.4* 33.6*  MCV 72.3* 70.9* 70.1*  PLT 205 171 243   Cardiac Enzymes: No results found for this basename: CKTOTAL, CKMB, CKMBINDEX, TROPONINI,  in the last 168 hours BNP: BNP (last 3 results)  Recent Labs  03/09/14 2013  PROBNP 4610.0*   CBG:  Recent Labs Lab 03/12/14 0539 03/12/14 1049 03/12/14 1612 03/12/14 2148 03/13/14 0658  GLUCAP 81 151* 172* 100* 94       Signed:  Jai-Gurmukh Makinsey Pepitone  [facility] 03/13/2014, 9:15 AM

## 2014-03-13 NOTE — Progress Notes (Signed)
Pt d/c to home via ambulance escorted by her daughter.  Karie Kirks, Therapist, sports.

## 2014-03-13 NOTE — Progress Notes (Signed)
Physical Therapy Treatment Patient Details Name: ANAGHA LOSEKE MRN: [id number] DOB: 06/13/1927 Today's Date: 03/13/2014    History of Present Illness MARSHEILA ALEJO is a 80 y.o. female who presents to the ED with SOB.  She also has a several day history of cough productive of a whitish sputum.  Today this is acutely worse.  She is also noted to be very hypertensive in the ED today.  When I asked the patient and family if she missed any BP meds today, they reply that actually yes she had just stopped her lasix, norvasc, and coreg today as it was on her med rec sheet from her last PCPs visit to DC these.  (Review of the sheet does indeed show that these were discontinued, presumably as duplicate meds on the list however as they are still on the med list under their generic names).    PT Comments    Pt cont's to require +2 assist for transfers but daughter feels confident that they will be able to care for pt at home.     Follow Up Recommendations  Home health PT;Supervision for mobility/OOB     Equipment Recommendations  None recommended by PT    Recommendations for Other Services       Precautions / Restrictions Precautions Precautions: Fall Restrictions Weight Bearing Restrictions: No    Mobility  Bed Mobility Overal bed mobility: Needs Assistance Bed Mobility: Supine to Sit     Supine to sit: Mod assist     General bed mobility comments: (A) to lift shoulders/trunk to sitting upright & use of draw pad to pivot hips around to EOB  Transfers Overall transfer level: Needs assistance   Transfers: Stand Pivot Transfers   Stand pivot transfers: +2 physical assistance;Max assist       General transfer comment: (A) to achieve standing, balance, rotation of hips,   Ambulation/Gait                 Stairs            Wheelchair Mobility    Modified Rankin (Stroke Patients Only)       Balance                                     Cognition Arousal/Alertness: Awake/alert Behavior During Therapy: WFL for tasks assessed/performed Overall Cognitive Status: Within Functional Limits for tasks assessed                      Exercises      General Comments        Pertinent Vitals/Pain No pain reported.      Home Living                      Prior Function            PT Goals (current goals can now be found in the care plan section) Acute Rehab PT Goals PT Goal Formulation: With patient Time For Goal Achievement: 03/24/14 Potential to Achieve Goals: Good Progress towards PT goals: Progressing toward goals    Frequency  Min 3X/week    PT Plan Current plan remains appropriate    Co-evaluation             End of Session Equipment Utilized During Treatment: Gait belt;Oxygen Activity Tolerance: Patient tolerated treatment well Patient left: in chair;with call bell/phone within reach;with family/visitor  present     Time: 0835-0850 PT Time Calculation (min): 15 min  Charges:  $Therapeutic Activity: 8-22 mins                      Sena Hitch 03/13/2014, 2:46 PM   Sarajane Marek, PTA [phone] 03/13/2014

## 2014-03-13 NOTE — Progress Notes (Signed)
Attempted to give d/c instructions.  Daughter at bed side, Gerald Dexter declined, stated that other daghter Pharrel who works here and is a Marine scientist will review meds after d/c.  Karie Kirks, Therapist, sports.

## 2014-03-15 NOTE — Progress Notes (Signed)
CSW notified to arrange EMS transport home..  CSW spoke to patient's daughter who stated that family is available to let patient into her home.  EMS transport arranged; nursing notified.  Patient stated that she was ready to go home and denied any SW needs.  No further CSW needs identified.  Lorie Phenix. [place], [place]

## 2014-03-19 ENCOUNTER — Encounter (HOSPITAL_COMMUNITY)
Admission: RE | Admit: 2014-03-19 | Discharge: 2014-03-19 | Disposition: A | Discharge status: Home / Self Care | Payer: Medicare Other | Source: Ambulatory Visit | Attending: Nephrology | Admitting: Nephrology

## 2014-03-19 DIAGNOSIS — N183 Chronic kidney disease, stage 3 unspecified: Secondary | ICD-10-CM | POA: Insufficient documentation

## 2014-03-19 DIAGNOSIS — D638 Anemia in other chronic diseases classified elsewhere: Secondary | ICD-10-CM | POA: Insufficient documentation

## 2014-03-19 LAB — IRON AND TIBC
Iron: 91 ug/dL (ref 42–135)
Saturation Ratios: 46 % (ref 20–55)
TIBC: 199 ug/dL — ABNORMAL LOW (ref 250–470)
UIBC: 108 ug/dL — ABNORMAL LOW (ref 125–400)

## 2014-03-19 LAB — FERRITIN: FERRITIN: 488 ng/mL — AB (ref 10–291)

## 2014-03-19 MED ORDER — EPOETIN ALFA 20000 UNIT/ML IJ SOLN: 20000.0000 [IU] | INTRAMUSCULAR | Status: DC

## 2014-03-19 MED ORDER — EPOETIN ALFA 20000 UNIT/ML IJ SOLN
INTRAMUSCULAR | Status: AC
  Administered 2014-03-19: 20000 [IU] via SUBCUTANEOUS
  Filled 2014-03-19: qty 1

## 2014-03-20 LAB — POCT HEMOGLOBIN-HEMACUE: Hemoglobin: 11 g/dL — ABNORMAL LOW (ref 12.0–15.0)

## 2014-04-02 ENCOUNTER — Encounter (HOSPITAL_COMMUNITY)
Admission: RE | Admit: 2014-04-02 | Discharge: 2014-04-02 | Disposition: A | Discharge status: Home / Self Care | Payer: Medicare Other | Source: Ambulatory Visit | Attending: Nephrology | Admitting: Nephrology

## 2014-04-02 LAB — POCT HEMOGLOBIN-HEMACUE: HEMOGLOBIN: 12.1 g/dL (ref 12.0–15.0)

## 2014-04-02 MED ORDER — EPOETIN ALFA 20000 UNIT/ML IJ SOLN: 20000.0000 [IU] | INTRAMUSCULAR | Status: DC

## 2014-04-15 ENCOUNTER — Inpatient Hospital Stay (HOSPITAL_COMMUNITY)
Admission: EM | Admit: 2014-04-15 | Discharge: 2014-04-21 | DRG: 291 | Disposition: A | Discharge status: Rehabilitation Facility | Payer: Medicare Other | Attending: Internal Medicine | Admitting: Internal Medicine

## 2014-04-15 ENCOUNTER — Encounter (HOSPITAL_COMMUNITY): Payer: Self-pay | Admitting: Emergency Medicine

## 2014-04-15 DIAGNOSIS — M109 Gout, unspecified: Secondary | ICD-10-CM | POA: Diagnosis present

## 2014-04-15 DIAGNOSIS — F411 Generalized anxiety disorder: Secondary | ICD-10-CM | POA: Diagnosis present

## 2014-04-15 DIAGNOSIS — Z8673 Personal history of transient ischemic attack (TIA), and cerebral infarction without residual deficits: Secondary | ICD-10-CM

## 2014-04-15 DIAGNOSIS — A0472 Enterocolitis due to Clostridium difficile, not specified as recurrent: Secondary | ICD-10-CM

## 2014-04-15 DIAGNOSIS — I129 Hypertensive chronic kidney disease with stage 1 through stage 4 chronic kidney disease, or unspecified chronic kidney disease: Secondary | ICD-10-CM | POA: Diagnosis present

## 2014-04-15 DIAGNOSIS — I739 Peripheral vascular disease, unspecified: Secondary | ICD-10-CM

## 2014-04-15 DIAGNOSIS — E875 Hyperkalemia: Secondary | ICD-10-CM

## 2014-04-15 DIAGNOSIS — R5381 Other malaise: Secondary | ICD-10-CM | POA: Diagnosis present

## 2014-04-15 DIAGNOSIS — Z66 Do not resuscitate: Secondary | ICD-10-CM | POA: Diagnosis present

## 2014-04-15 DIAGNOSIS — F3289 Other specified depressive episodes: Secondary | ICD-10-CM | POA: Diagnosis present

## 2014-04-15 DIAGNOSIS — J4489 Other specified chronic obstructive pulmonary disease: Secondary | ICD-10-CM | POA: Diagnosis present

## 2014-04-15 DIAGNOSIS — E1159 Type 2 diabetes mellitus with other circulatory complications: Secondary | ICD-10-CM | POA: Diagnosis present

## 2014-04-15 DIAGNOSIS — N189 Chronic kidney disease, unspecified: Secondary | ICD-10-CM

## 2014-04-15 DIAGNOSIS — I798 Other disorders of arteries, arterioles and capillaries in diseases classified elsewhere: Secondary | ICD-10-CM | POA: Diagnosis present

## 2014-04-15 DIAGNOSIS — Z8249 Family history of ischemic heart disease and other diseases of the circulatory system: Secondary | ICD-10-CM

## 2014-04-15 DIAGNOSIS — J189 Pneumonia, unspecified organism: Secondary | ICD-10-CM

## 2014-04-15 DIAGNOSIS — H409 Unspecified glaucoma: Secondary | ICD-10-CM | POA: Diagnosis present

## 2014-04-15 DIAGNOSIS — H269 Unspecified cataract: Secondary | ICD-10-CM | POA: Diagnosis present

## 2014-04-15 DIAGNOSIS — M19019 Primary osteoarthritis, unspecified shoulder: Secondary | ICD-10-CM | POA: Diagnosis present

## 2014-04-15 DIAGNOSIS — F329 Major depressive disorder, single episode, unspecified: Secondary | ICD-10-CM | POA: Diagnosis present

## 2014-04-15 DIAGNOSIS — I5031 Acute diastolic (congestive) heart failure: Secondary | ICD-10-CM

## 2014-04-15 DIAGNOSIS — Z833 Family history of diabetes mellitus: Secondary | ICD-10-CM

## 2014-04-15 DIAGNOSIS — E785 Hyperlipidemia, unspecified: Secondary | ICD-10-CM | POA: Diagnosis present

## 2014-04-15 DIAGNOSIS — S78119A Complete traumatic amputation at level between unspecified hip and knee, initial encounter: Secondary | ICD-10-CM

## 2014-04-15 DIAGNOSIS — N183 Chronic kidney disease, stage 3 unspecified: Secondary | ICD-10-CM

## 2014-04-15 DIAGNOSIS — N179 Acute kidney failure, unspecified: Secondary | ICD-10-CM

## 2014-04-15 DIAGNOSIS — J449 Chronic obstructive pulmonary disease, unspecified: Secondary | ICD-10-CM

## 2014-04-15 DIAGNOSIS — Z993 Dependence on wheelchair: Secondary | ICD-10-CM

## 2014-04-15 DIAGNOSIS — R6510 Systemic inflammatory response syndrome (SIRS) of non-infectious origin without acute organ dysfunction: Secondary | ICD-10-CM

## 2014-04-15 DIAGNOSIS — I2510 Atherosclerotic heart disease of native coronary artery without angina pectoris: Secondary | ICD-10-CM | POA: Diagnosis present

## 2014-04-15 DIAGNOSIS — D509 Iron deficiency anemia, unspecified: Secondary | ICD-10-CM | POA: Diagnosis present

## 2014-04-15 DIAGNOSIS — L98499 Non-pressure chronic ulcer of skin of other sites with unspecified severity: Secondary | ICD-10-CM

## 2014-04-15 DIAGNOSIS — I5033 Acute on chronic diastolic (congestive) heart failure: Principal | ICD-10-CM | POA: Diagnosis present

## 2014-04-15 DIAGNOSIS — I509 Heart failure, unspecified: Secondary | ICD-10-CM

## 2014-04-15 DIAGNOSIS — Z89619 Acquired absence of unspecified leg above knee: Secondary | ICD-10-CM

## 2014-04-15 DIAGNOSIS — Z6833 Body mass index (BMI) 33.0-33.9, adult: Secondary | ICD-10-CM

## 2014-04-15 DIAGNOSIS — E669 Obesity, unspecified: Secondary | ICD-10-CM | POA: Diagnosis present

## 2014-04-15 DIAGNOSIS — C50919 Malignant neoplasm of unspecified site of unspecified female breast: Secondary | ICD-10-CM

## 2014-04-15 DIAGNOSIS — Z87891 Personal history of nicotine dependence: Secondary | ICD-10-CM

## 2014-04-15 DIAGNOSIS — J9620 Acute and chronic respiratory failure, unspecified whether with hypoxia or hypercapnia: Secondary | ICD-10-CM | POA: Diagnosis present

## 2014-04-15 DIAGNOSIS — I10 Essential (primary) hypertension: Secondary | ICD-10-CM

## 2014-04-15 DIAGNOSIS — Z79899 Other long term (current) drug therapy: Secondary | ICD-10-CM

## 2014-04-15 DIAGNOSIS — E0781 Sick-euthyroid syndrome: Secondary | ICD-10-CM

## 2014-04-15 DIAGNOSIS — Z7982 Long term (current) use of aspirin: Secondary | ICD-10-CM

## 2014-04-15 DIAGNOSIS — Z951 Presence of aortocoronary bypass graft: Secondary | ICD-10-CM

## 2014-04-15 DIAGNOSIS — Z9181 History of falling: Secondary | ICD-10-CM

## 2014-04-15 DIAGNOSIS — E119 Type 2 diabetes mellitus without complications: Secondary | ICD-10-CM

## 2014-04-15 HISTORY — DX: Shortness of breath: R06.02

## 2014-04-15 HISTORY — DX: Unspecified osteoarthritis, unspecified site: M19.90

## 2014-04-15 HISTORY — DX: Major depressive disorder, single episode, unspecified: F32.9

## 2014-04-15 HISTORY — DX: Depression, unspecified: F32.A

## 2014-04-15 HISTORY — DX: Anemia, unspecified: D64.9

## 2014-04-15 HISTORY — DX: Gastro-esophageal reflux disease without esophagitis: K21.9

## 2014-04-15 HISTORY — DX: Pneumonia, unspecified organism: J18.9

## 2014-04-15 NOTE — ED Notes (Signed)
Pt from home via EMS. Pt had Lasix prescription change 3 days ago, became short of breath this evening. Upon EMS arrival pt sat was 88%, placed on 3 L O2 and achieved 98%. Pt has hx of CHF and diabetes.

## 2014-04-16 ENCOUNTER — Inpatient Hospital Stay (HOSPITAL_COMMUNITY): Payer: Medicare Other

## 2014-04-16 ENCOUNTER — Emergency Department (HOSPITAL_COMMUNITY): Payer: Medicare Other

## 2014-04-16 ENCOUNTER — Encounter (HOSPITAL_COMMUNITY): Payer: Self-pay | Admitting: General Practice

## 2014-04-16 DIAGNOSIS — I10 Essential (primary) hypertension: Secondary | ICD-10-CM

## 2014-04-16 DIAGNOSIS — I739 Peripheral vascular disease, unspecified: Secondary | ICD-10-CM

## 2014-04-16 DIAGNOSIS — I509 Heart failure, unspecified: Secondary | ICD-10-CM

## 2014-04-16 LAB — BASIC METABOLIC PANEL
BUN: 74 mg/dL — ABNORMAL HIGH (ref 6–23)
CO2: 19 meq/L (ref 19–32)
Calcium: 8.9 mg/dL (ref 8.4–10.5)
Chloride: 108 mEq/L (ref 96–112)
Creatinine, Ser: 1.98 mg/dL — ABNORMAL HIGH (ref 0.50–1.10)
GFR calc Af Amer: 25 mL/min — ABNORMAL LOW (ref >90)
GFR calc non Af Amer: 22 mL/min — ABNORMAL LOW (ref >90)
Glucose, Bld: 131 mg/dL — ABNORMAL HIGH (ref 70–99)
POTASSIUM: 4.6 meq/L (ref 3.7–5.3)
Sodium: 144 mEq/L (ref 137–147)

## 2014-04-16 LAB — CBC WITH DIFFERENTIAL/PLATELET
BASOS ABS: 0 10*3/uL (ref 0.0–0.1)
Basophils Relative: 0 % (ref 0–1)
Eosinophils Absolute: 0.2 10*3/uL (ref 0.0–0.7)
Eosinophils Relative: 3 % (ref 0–5)
HCT: 31.6 % — ABNORMAL LOW (ref 36.0–46.0)
Hemoglobin: 10.1 g/dL — ABNORMAL LOW (ref 12.0–15.0)
LYMPHS ABS: 1.1 10*3/uL (ref 0.7–4.0)
LYMPHS PCT: 12 % (ref 12–46)
MCH: 23.1 pg — ABNORMAL LOW (ref 26.0–34.0)
MCHC: 32 g/dL (ref 30.0–36.0)
MCV: 72.1 fL — ABNORMAL LOW (ref 78.0–100.0)
Monocytes Absolute: 0.5 10*3/uL (ref 0.1–1.0)
Monocytes Relative: 5 % (ref 3–12)
NEUTROS ABS: 6.9 10*3/uL (ref 1.7–7.7)
Neutrophils Relative %: 80 % — ABNORMAL HIGH (ref 43–77)
PLATELETS: 207 10*3/uL (ref 150–400)
RBC: 4.38 MIL/uL (ref 3.87–5.11)
RDW: 18.6 % — AB (ref 11.5–15.5)
WBC: 8.7 10*3/uL (ref 4.0–10.5)

## 2014-04-16 LAB — I-STAT TROPONIN, ED: Troponin i, poc: 0.02 ng/mL (ref 0.00–0.08)

## 2014-04-16 LAB — COMPREHENSIVE METABOLIC PANEL
ALK PHOS: 119 U/L — AB (ref 39–117)
ALT: 24 U/L (ref 0–35)
AST: 38 U/L — AB (ref 0–37)
Albumin: 3.6 g/dL (ref 3.5–5.2)
BUN: 77 mg/dL — ABNORMAL HIGH (ref 6–23)
CO2: 18 mEq/L — ABNORMAL LOW (ref 19–32)
Calcium: 9 mg/dL (ref 8.4–10.5)
Chloride: 108 mEq/L (ref 96–112)
Creatinine, Ser: 2.02 mg/dL — ABNORMAL HIGH (ref 0.50–1.10)
GFR calc Af Amer: 25 mL/min — ABNORMAL LOW (ref >90)
GFR, EST NON AFRICAN AMERICAN: 21 mL/min — AB (ref >90)
Glucose, Bld: 128 mg/dL — ABNORMAL HIGH (ref 70–99)
Potassium: 5.7 mEq/L — ABNORMAL HIGH (ref 3.7–5.3)
Sodium: 140 mEq/L (ref 137–147)
Total Bilirubin: 0.3 mg/dL (ref 0.3–1.2)
Total Protein: 7.3 g/dL (ref 6.0–8.3)

## 2014-04-16 LAB — FERRITIN: Ferritin: 526 ng/mL — ABNORMAL HIGH (ref 10–291)

## 2014-04-16 LAB — IRON AND TIBC
Iron: 33 ug/dL — ABNORMAL LOW (ref 42–135)
Saturation Ratios: 20 % (ref 20–55)
TIBC: 163 ug/dL — AB (ref 250–470)
UIBC: 130 ug/dL (ref 125–400)

## 2014-04-16 LAB — GLUCOSE, CAPILLARY
GLUCOSE-CAPILLARY: 120 mg/dL — AB (ref 70–99)
GLUCOSE-CAPILLARY: 140 mg/dL — AB (ref 70–99)
GLUCOSE-CAPILLARY: 149 mg/dL — AB (ref 70–99)
GLUCOSE-CAPILLARY: 246 mg/dL — AB (ref 70–99)
Glucose-Capillary: 108 mg/dL — ABNORMAL HIGH (ref 70–99)

## 2014-04-16 LAB — PRO B NATRIURETIC PEPTIDE: Pro B Natriuretic peptide (BNP): 3805 pg/mL — ABNORMAL HIGH (ref 0–450)

## 2014-04-16 LAB — T3, FREE: T3, Free: 2.3 pg/mL (ref 2.3–4.2)

## 2014-04-16 LAB — T4, FREE: FREE T4: 1.15 ng/dL (ref 0.80–1.80)

## 2014-04-16 LAB — VITAMIN B12: Vitamin B-12: 511 pg/mL (ref 211–911)

## 2014-04-16 LAB — TSH: TSH: 4.92 u[IU]/mL — ABNORMAL HIGH (ref 0.350–4.500)

## 2014-04-16 LAB — HEMOGLOBIN A1C
Hgb A1c MFr Bld: 6.4 % — ABNORMAL HIGH (ref <5.7)
Mean Plasma Glucose: 137 mg/dL — ABNORMAL HIGH (ref <117)

## 2014-04-16 LAB — TRANSFERRIN: Transferrin: 130 mg/dL — ABNORMAL LOW (ref 200–360)

## 2014-04-16 MED ORDER — TIMOLOL MALEATE 0.5 % OP SOLN
1.0000 [drp] | Freq: Two times a day (BID) | OPHTHALMIC | Status: DC
  Administered 2014-04-16 – 2014-04-21 (×11): 1 [drp] via OPHTHALMIC
  Filled 2014-04-16: qty 5

## 2014-04-16 MED ORDER — SODIUM CHLORIDE 0.9 % IJ SOLN: 3.0000 mL | INTRAMUSCULAR | Status: DC | PRN

## 2014-04-16 MED ORDER — CAPSAICIN 0.025 % EX CREA
TOPICAL_CREAM | Freq: Two times a day (BID) | CUTANEOUS | Status: DC
  Administered 2014-04-16 – 2014-04-19 (×7): via TOPICAL
  Administered 2014-04-19: 1 via TOPICAL
  Administered 2014-04-20 – 2014-04-21 (×3): via TOPICAL
  Filled 2014-04-16: qty 56.6

## 2014-04-16 MED ORDER — ENOXAPARIN SODIUM 30 MG/0.3ML ~~LOC~~ SOLN
30.0000 mg | SUBCUTANEOUS | Status: DC
  Administered 2014-04-16 – 2014-04-21 (×6): 30 mg via SUBCUTANEOUS
  Filled 2014-04-16 (×6): qty 0.3

## 2014-04-16 MED ORDER — MONTELUKAST SODIUM 10 MG PO TABS
10.0000 mg | ORAL_TABLET | Freq: Every day | ORAL | Status: DC
  Administered 2014-04-16 – 2014-04-20 (×5): 10 mg via ORAL
  Filled 2014-04-16 (×6): qty 1

## 2014-04-16 MED ORDER — EZETIMIBE 10 MG PO TABS
10.0000 mg | ORAL_TABLET | Freq: Every day | ORAL | Status: DC
  Administered 2014-04-16 – 2014-04-21 (×6): 10 mg via ORAL
  Filled 2014-04-16 (×6): qty 1

## 2014-04-16 MED ORDER — FUROSEMIDE 10 MG/ML IJ SOLN
80.0000 mg | Freq: Two times a day (BID) | INTRAMUSCULAR | Status: AC
  Administered 2014-04-16 – 2014-04-17 (×4): 80 mg via INTRAVENOUS
  Filled 2014-04-16 (×4): qty 8

## 2014-04-16 MED ORDER — FUROSEMIDE 10 MG/ML IJ SOLN
40.0000 mg | INTRAMUSCULAR | Status: AC
  Administered 2014-04-16: 40 mg via INTRAVENOUS
  Filled 2014-04-16: qty 4

## 2014-04-16 MED ORDER — ONDANSETRON HCL 4 MG/2ML IJ SOLN: 4.0000 mg | Freq: Three times a day (TID) | INTRAMUSCULAR | Status: AC | PRN

## 2014-04-16 MED ORDER — HYDRALAZINE HCL 50 MG PO TABS
100.0000 mg | ORAL_TABLET | Freq: Three times a day (TID) | ORAL | Status: DC
  Administered 2014-04-16 – 2014-04-21 (×17): 100 mg via ORAL
  Filled 2014-04-16 (×18): qty 2

## 2014-04-16 MED ORDER — SODIUM CHLORIDE 0.9 % IJ SOLN
3.0000 mL | Freq: Two times a day (BID) | INTRAMUSCULAR | Status: DC
  Administered 2014-04-16 – 2014-04-21 (×11): 3 mL via INTRAVENOUS

## 2014-04-16 MED ORDER — CITALOPRAM HYDROBROMIDE 10 MG PO TABS
10.0000 mg | ORAL_TABLET | Freq: Every day | ORAL | Status: DC
  Administered 2014-04-16 – 2014-04-21 (×6): 10 mg via ORAL
  Filled 2014-04-16 (×6): qty 1

## 2014-04-16 MED ORDER — FAMOTIDINE 20 MG PO TABS
20.0000 mg | ORAL_TABLET | Freq: Every day | ORAL | Status: DC
  Administered 2014-04-16 – 2014-04-21 (×6): 20 mg via ORAL
  Filled 2014-04-16 (×6): qty 1

## 2014-04-16 MED ORDER — POLYSACCHARIDE IRON COMPLEX 150 MG PO CAPS
150.0000 mg | ORAL_CAPSULE | Freq: Two times a day (BID) | ORAL | Status: DC
  Administered 2014-04-16 – 2014-04-21 (×11): 150 mg via ORAL
  Filled 2014-04-16 (×13): qty 1

## 2014-04-16 MED ORDER — ALLOPURINOL 100 MG PO TABS
100.0000 mg | ORAL_TABLET | Freq: Every day | ORAL | Status: DC
  Administered 2014-04-16 – 2014-04-21 (×6): 100 mg via ORAL
  Filled 2014-04-16 (×6): qty 1

## 2014-04-16 MED ORDER — SODIUM POLYSTYRENE SULFONATE 15 GM/60ML PO SUSP
30.0000 g | Freq: Once | ORAL | Status: AC
  Administered 2014-04-16: 30 g via ORAL
  Filled 2014-04-16: qty 120

## 2014-04-16 MED ORDER — ISOSORBIDE MONONITRATE ER 60 MG PO TB24
60.0000 mg | ORAL_TABLET | Freq: Every day | ORAL | Status: DC
  Administered 2014-04-16 – 2014-04-17 (×2): 60 mg via ORAL
  Filled 2014-04-16 (×2): qty 1

## 2014-04-16 MED ORDER — AMLODIPINE BESYLATE 10 MG PO TABS
10.0000 mg | ORAL_TABLET | Freq: Every day | ORAL | Status: DC
  Administered 2014-04-16 – 2014-04-21 (×6): 10 mg via ORAL
  Filled 2014-04-16 (×6): qty 1

## 2014-04-16 MED ORDER — INSULIN ASPART 100 UNIT/ML ~~LOC~~ SOLN
0.0000 [IU] | SUBCUTANEOUS | Status: DC
  Administered 2014-04-16 (×2): 1 [IU] via SUBCUTANEOUS
  Administered 2014-04-16 – 2014-04-17 (×2): 3 [IU] via SUBCUTANEOUS
  Administered 2014-04-18: 2 [IU] via SUBCUTANEOUS
  Administered 2014-04-19: 3 [IU] via SUBCUTANEOUS
  Administered 2014-04-20 – 2014-04-21 (×3): 2 [IU] via SUBCUTANEOUS
  Administered 2014-04-21: 1 [IU] via SUBCUTANEOUS

## 2014-04-16 MED ORDER — POLYETHYLENE GLYCOL 3350 17 G PO PACK
17.0000 g | PACK | Freq: Two times a day (BID) | ORAL | Status: DC | PRN
  Administered 2014-04-19: 17 g via ORAL
  Filled 2014-04-16: qty 1

## 2014-04-16 MED ORDER — CARVEDILOL 3.125 MG PO TABS
3.1250 mg | ORAL_TABLET | Freq: Two times a day (BID) | ORAL | Status: DC
  Administered 2014-04-16 – 2014-04-17 (×3): 3.125 mg via ORAL
  Filled 2014-04-16 (×5): qty 1

## 2014-04-16 MED ORDER — ACETAMINOPHEN 325 MG PO TABS
650.0000 mg | ORAL_TABLET | Freq: Four times a day (QID) | ORAL | Status: DC | PRN
  Administered 2014-04-16 – 2014-04-18 (×2): 650 mg via ORAL
  Filled 2014-04-16 (×2): qty 2

## 2014-04-16 MED ORDER — SODIUM CHLORIDE 0.9 % IV SOLN: 250.0000 mL | INTRAVENOUS | Status: DC | PRN

## 2014-04-16 MED ORDER — LORATADINE 10 MG PO TABS
10.0000 mg | ORAL_TABLET | Freq: Every day | ORAL | Status: DC
  Administered 2014-04-16 – 2014-04-21 (×6): 10 mg via ORAL
  Filled 2014-04-16 (×6): qty 1

## 2014-04-16 NOTE — ED Provider Notes (Signed)
CSN: [id number]     Arrival date & time 04/15/14  [id number] History   First MD Initiated Contact with Patient 04/16/14 0017     Chief Complaint  Patient presents with  . Shortness of Breath     (Consider location/radiation/quality/duration/timing/severity/associated sxs/prior Treatment) HPI Comments: 80 y/o female with hx of CHF who presents with a c/o SOB - this started several days ago and is persistent, gradually worsening and much worse when she tries to get out of bed - she has had a RLE amputation but any mobility makes her SOB - she also has significant orthopnea.  No fevers or chills but she has been coughing up some clear mucous.  She was recently found to have increased Cr at her MD's office, she was reduced from lasix 40mg  bid to 40mg  qdaily, then Cr didn't improve so she was cut to 20mg  once daily - she does have some baseline renal insufficiency.  No other meds given at home to help with this SOB prior to arrival.  She was 88% on EMS arrival - [place] improved to 98%.  Patient is a 80 y.o. female presenting with shortness of breath. The history is provided by the patient and a relative.  Shortness of Breath   Past Medical History  Diagnosis Date  . DM (diabetes mellitus) type II controlled peripheral vascular disorder     A1C = 5.8  . HTN (hypertension)   . CHF (congestive heart failure)     EF = 10-62%, grade 2 diastolic dysfunction  . CKD (chronic kidney disease)   . CAD (coronary artery disease)   . COPD (chronic obstructive pulmonary disease)   . Cataracts, bilateral   . Hyperlipidemia   . Peripheral vascular disease bilateral    S/P femoral-popliteal bypass surgery  . Glaucoma   . TIA (transient ischemic attack)    Past Surgical History  Procedure Laterality Date  . Coronary artery bypass graft    . Spine surgery    . Eye surgery  cataract  . Carpal tunnel release  left  . Breast surgery  lumpectomy right  . Aortogram w/ runoff  06/06/11    right leg  . Above knee leg  amputation  07/27/11    Right AKA   Family History  Problem Relation Age of Onset  . Hypertension Mother   . Peripheral vascular disease Mother   . Diabetes Father   . Kidney disease Sister   . Cancer Brother     lung  . Lung disease Sister   . Lung disease Sister   . Kidney disease Daughter     On dialysis, died in 23-Feb-2006   History  Substance Use Topics  . Smoking status: Former Smoker -- 1.00 packs/day for 40 years    Types: Cigarettes    Quit date: 04/13/1977  . Smokeless tobacco: Never Used  . Alcohol Use: No   OB History   Grav Para Term Preterm Abortions TAB SAB Ect Mult Living                 Review of Systems  Respiratory: Positive for shortness of breath.   All other systems reviewed and are negative.     Allergies  Codeine; Omnipaque; Celebrex; and Iodine  Home Medications   Prior to Admission medications   Medication Sig Start Date End Date Taking? Authorizing Provider  iron polysaccharides (NIFEREX) 150 MG capsule Take 150 mg by mouth 2 (two) times daily.   Yes Historical Provider, MD  allopurinol (  ZYLOPRIM) 100 MG tablet Take 100 mg by mouth daily.    Historical Provider, MD  amLODipine (NORVASC) 10 MG tablet Take 10 mg by mouth daily.    Historical Provider, MD  B Complex-C-Folic Acid (VOL-CARE RX PO) Take 1 tablet by mouth daily.    Historical Provider, MD  carvedilol (COREG) 6.25 MG tablet Take 6.25 mg by mouth 2 (two) times daily with a meal.    Historical Provider, MD  cephALEXin (KEFLEX) 500 MG capsule Take 1 capsule (500 mg total) by mouth every 12 (twelve) hours. 03/13/14   Nita Sells, MD  citalopram (CELEXA) 10 MG tablet Take 20 mg by mouth daily.     Historical Provider, MD  ezetimibe (ZETIA) 10 MG tablet Take 10 mg by mouth daily.    Historical Provider, MD  famotidine (PEPCID) 20 MG tablet Take 20 mg by mouth daily.    Historical Provider, MD  fluconazole (DIFLUCAN) 100 MG tablet Take 1 tablet (100 mg total) by mouth daily. 03/13/14    Nita Sells, MD  furosemide (LASIX) 20 MG tablet Take 2 tablets (40 mg total) by mouth 2 (two) times daily. 03/13/14   Nita Sells, MD  hydrALAZINE (APRESOLINE) 100 MG tablet Take 100 mg by mouth 3 (three) times daily.    Historical Provider, MD  isosorbide mononitrate (IMDUR) 60 MG 24 hr tablet Take 1 tablet (60 mg total) by mouth daily. 03/13/14   Nita Sells, MD  nystatin cream (MYCOSTATIN) Apply 1 application topically 2 (two) times daily.    Historical Provider, MD  oxyCODONE-acetaminophen (PERCOCET/ROXICET) 5-325 MG per tablet Take 1 tablet by mouth every 6 (six) hours as needed for moderate pain.    Historical Provider, MD  polyethylene glycol (MIRALAX / GLYCOLAX) packet Take 17 g by mouth daily. 03/13/14   Nita Sells, MD   BP 173/51  Pulse 64  Temp(Src) 98.6 F (37 C)  Resp 26  Ht 5\' 2"  (1.575 m)  SpO2 100% Physical Exam  Nursing note and vitals reviewed. Constitutional: She appears well-developed and well-nourished. No distress.  HENT:  Head: Normocephalic and atraumatic.  Mouth/Throat: Oropharynx is clear and moist. No oropharyngeal exudate.  Eyes: Conjunctivae and EOM are normal. Pupils are equal, round, and reactive to light. Right eye exhibits no discharge. Left eye exhibits no discharge. No scleral icterus.  Neck: Normal range of motion. Neck supple. No JVD present. No thyromegaly present.  Cardiovascular: Normal rate, regular rhythm, normal heart sounds and intact distal pulses.  Exam reveals no gallop and no friction rub.   No murmur heard. Pulmonary/Chest: Effort normal. No respiratory distress. She has no wheezes. She has rales ( soft rales bilateral bases).  No increased WOB, speaks in full sentences  Abdominal: Soft. Bowel sounds are normal. She exhibits no distension and no mass. There is no tenderness.  Musculoskeletal: Normal range of motion. She exhibits no edema and no tenderness.  No edema of the LLE  Lymphadenopathy:    She has no  cervical adenopathy.  Neurological: She is alert. Coordination normal.  Skin: Skin is warm and dry. No rash noted. No erythema.  Psychiatric: She has a normal mood and affect. Her behavior is normal.    ED Course  Procedures (including critical care time) Labs Review Labs Reviewed  CBC WITH DIFFERENTIAL - Abnormal; Notable for the following:    Hemoglobin 10.1 (*)    HCT 31.6 (*)    MCV 72.1 (*)    MCH 23.1 (*)    RDW 18.6 (*)  Neutrophils Relative % 80 (*)    All other components within normal limits  COMPREHENSIVE METABOLIC PANEL - Abnormal; Notable for the following:    Potassium 5.7 (*)    CO2 18 (*)    Glucose, Bld 128 (*)    BUN 77 (*)    Creatinine, Ser 2.02 (*)    AST 38 (*)    Alkaline Phosphatase 119 (*)    GFR calc non Af Amer 21 (*)    GFR calc Af Amer 25 (*)    All other components within normal limits  PRO B NATRIURETIC PEPTIDE - Abnormal; Notable for the following:    Pro B Natriuretic peptide (BNP) 3805.0 (*)    All other components within normal limits  I-STAT TROPOININ, ED    Imaging Review Dg Chest 2 View  04/16/2014   CLINICAL DATA:  Short of breath.  EXAM: CHEST  2 VIEW  COMPARISON:  03/11/2014  FINDINGS: Hazy bilateral lung opacities with irregular interstitial opacities, consistent with pulmonary edema. There is more confluent lung base opacity likely additional atelectasis associated with small effusions.  Cardiac silhouette is mildly enlarged. Changes from CABG surgery are stable.  No pneumothorax.  Bony thorax is diffusely demineralized. There are stable arthropathic changes of the right shoulder.  IMPRESSION: Congestive heart failure with pulmonary edema and small effusions.   Electronically Signed   By: Lajean Manes M.D.   On: 04/16/2014 00:26     EKG Interpretation   Date/Time:  Thursday April 16 2014 00:00:17 EDT Ventricular Rate:  64 PR Interval:  253 QRS Duration: 165 QT Interval:  463 QTC Calculation: 478 R Axis:   -167 Text  Interpretation:  Sinus rhythm Prolonged PR interval Right bundle  branch block Abnormal ekg since last tracing no significant change  Confirmed by Jolaine Fryberger  MD, Vieno Tarrant ([id number]) on 04/16/2014 12:17:15 AM      MDM   Final diagnoses:  CHF (congestive heart failure)  Acute on chronic renal failure    Pt has hypoxia remedied with O2, needs lasix according to exam and xray - likely fluid overload, no leukocytosis, no fever, no hypotension.    BNP up, CXR with pulm edema,  Lasix ordered  D/w hospitalist Dr. Arnoldo Morale who agrees with tele admit.  Meds given in ED:  Medications  furosemide (LASIX) injection 40 mg (40 mg Intravenous Given 04/16/14 0047)  sodium polystyrene (KAYEXALATE) 15 GM/60ML suspension 30 g (30 g Oral Given 04/16/14 0115)    New Prescriptions   No medications on file      Johnna Acosta, MD 04/16/14 [phone]

## 2014-04-16 NOTE — H&P (Signed)
[facility] History and Physical  [name] GQQ:[id number] DOB: 12-01-1926 DOA: 04/15/2014  Referring physician: EDP PCP: Simona Huh, MD  Specialists:   Chief Complaint: SOB  HPI: [name] is a 80 y.o. female with a history of Diastolic CHF, CAD, HTN, CKD Stage III, DM2, Hyperlipidemia, and PVD who presents to the ED with complaints of worsening SOB over the past 2 days.  She reports having orthopnea, denies having chest pain, and she has a non-productive cough.  She was found to have hypoxemia of 88% in the ED and placed on 3 liters NCO2.   A chest X-ray was performed and she was found to have pulmonary edema and small bilateral pleural effusions. Her Pro-BNP was 3805.0.   She was administered 40 mg IV Lasix x 1 and given Kayexalate x1 for hyperkalemia of 5.7.  She was referred for medical admission.        Review of Systems:  Constitutional: No Weight Loss, No Weight Gain, Night Sweats, Fevers, Chills, Fatigue, or Generalized Weakness HEENT: No Headaches, Difficulty Swallowing,Tooth/Dental Problems,Sore Throat,  No Sneezing, Rhinitis, Ear Ache, Nasal Congestion, or Post Nasal Drip,  Cardio-vascular:  No Chest pain, +Orthopnea, PND, Edema in lower extremities, Anasarca, Dizziness, Palpitations  Resp: +Dyspnea, +DOE, +Cough Non-Productive Cough, No Wheezing.    GI: No Heartburn, Indigestion, Abdominal Pain, Nausea, Vomiting, Diarrhea, Change in Bowel Habits,  Loss of Appetite  GU: No Dysuria, Change in Color of Urine, No Urgency or Frequency.  No flank pain.  Musculoskeletal: No Joint Pain or Swelling.  No Decreased Range of Motion. No Back Pain.  Neurologic: No Syncope, No Seizures, Muscle Weakness, Paresthesia, Vision Disturbance or Loss, No Diplopia, No Vertigo, No Difficulty Walking,  Skin: No Rash or Lesions. Psych: No Change in Mood or Affect. No Depression or Anxiety. No Memory loss. No Confusion or Hallucinations   Past Medical History  Diagnosis Date  . DM  (diabetes mellitus) type II controlled peripheral vascular disorder     A1C = 5.8  . HTN (hypertension)   . CHF (congestive heart failure)     EF = 67-12%, grade 2 diastolic dysfunction  . CKD (chronic kidney disease)   . CAD (coronary artery disease)   . COPD (chronic obstructive pulmonary disease)   . Cataracts, bilateral   . Hyperlipidemia   . Peripheral vascular disease bilateral    S/P femoral-popliteal bypass surgery  . Glaucoma   . TIA (transient ischemic attack)     Past Surgical History  Procedure Laterality Date  . Coronary artery bypass graft    . Spine surgery    . Eye surgery  cataract  . Carpal tunnel release  left  . Breast surgery  lumpectomy right  . Aortogram w/ runoff  06/06/11    right leg  . Above knee leg amputation  07/27/11    Right AKA     Prior to Admission medications   Medication Sig Start Date End Date Taking? Authorizing Provider  acetaminophen (TYLENOL) 500 MG tablet Take 1,000 mg by mouth 2 (two) times daily as needed for mild pain or fever.   Yes Historical Provider, MD  allopurinol (ZYLOPRIM) 100 MG tablet Take 100 mg by mouth at bedtime.    Yes Historical Provider, MD  amLODipine (NORVASC) 10 MG tablet Take 10 mg by mouth daily.   Yes Historical Provider, MD  aspirin EC 81 MG tablet Take 81 mg by mouth daily.   Yes Historical Provider, MD  B Complex-C-Folic Acid (VOL-CARE RX  PO) Take 1 tablet by mouth daily.   Yes Historical Provider, MD  carvedilol (COREG) 3.125 MG tablet Take 3.125 mg by mouth 2 (two) times daily with a meal.   Yes Historical Provider, MD  citalopram (CELEXA) 10 MG tablet Take 10 mg by mouth daily.    Yes Historical Provider, MD  ezetimibe (ZETIA) 10 MG tablet Take 10 mg by mouth daily.   Yes Historical Provider, MD  famotidine (PEPCID) 20 MG tablet Take 20 mg by mouth daily.   Yes Historical Provider, MD  furosemide (LASIX) 20 MG tablet Take 20 mg by mouth daily.   Yes Historical Provider, MD  hydrALAZINE (APRESOLINE) 100  MG tablet Take 100 mg by mouth 3 (three) times daily.   Yes Historical Provider, MD  iron polysaccharides (NIFEREX) 150 MG capsule Take 150 mg by mouth 2 (two) times daily.   Yes Historical Provider, MD  isosorbide mononitrate (IMDUR) 60 MG 24 hr tablet Take 1 tablet (60 mg total) by mouth daily. 03/13/14  Yes Nita Sells, MD  loratadine (CLARITIN) 10 MG tablet Take 10 mg by mouth daily.   Yes Historical Provider, MD  montelukast (SINGULAIR) 10 MG tablet Take 10 mg by mouth at bedtime.   Yes Historical Provider, MD  nystatin cream (MYCOSTATIN) Apply 1 application topically 2 (two) times daily.   Yes Historical Provider, MD  polyethylene glycol (MIRALAX / GLYCOLAX) packet Take 17 g by mouth daily as needed for mild constipation.   Yes Historical Provider, MD  timolol (BETIMOL) 0.5 % ophthalmic solution Place 1 drop into both eyes 2 (two) times daily.   Yes Historical Provider, MD    Allergies  Allergen Reactions  . Codeine Other (See Comments)    REACTION: unknown  . Omnipaque [Iohexol] Other (See Comments)    REACTION: unknown  . Celebrex [Celecoxib] Rash  . Iodine Rash    Social History:  Lives with her Daughter  reports that she quit smoking about 37 years ago. Her smoking use included Cigarettes. She has a 40 pack-year smoking history. She has never used smokeless tobacco. She reports that she does not drink alcohol or use illicit drugs.     Family History  Problem Relation Age of Onset  . Hypertension Mother   . Peripheral vascular disease Mother   . Diabetes Father   . Kidney disease Sister   . Cancer Brother     lung  . Lung disease Sister   . Lung disease Sister   . Kidney disease Daughter     On dialysis, died in 2006-02-27      Physical Exam:  GEN:  Pleasant Elderly Obese 80 y.o. African American female  examined  and in no acute distress; cooperative with exam Filed Vitals:   04/16/14 0100 04/16/14 0115 04/16/14 0130 04/16/14 0145  BP: 169/65 173/51 174/74 180/66   Pulse: 63 64 65 66  Temp:      Resp: 22 26 28 23   Height:      SpO2: 100% 100% 99% 99%   Blood pressure 180/66, pulse 66, temperature 98.6 F (37 C), resp. rate 23, height 5\' 2"  (1.575 m), SpO2 99.00%. PSYCH: She is alert and oriented x4; does not appear anxious does not appear depressed; affect is normal HEENT: Normocephalic and Atraumatic, Mucous membranes pink; PERRLA; EOM intact; Fundi:  Benign;  No scleral icterus, Nares: Patent, Oropharynx: Clear, Edentulous, Neck:  FROM, no cervical lymphadenopathy nor thyromegaly or carotid bruit; no JVD; Breasts:: Not examined CHEST WALL: No tenderness CHEST:  Normal respiration, clear to auscultation bilaterally HEART: Regular rate and rhythm; no murmurs rubs or gallops BACK: No kyphosis or scoliosis; no CVA tenderness ABDOMEN: Positive Bowel Sounds, Obese, soft non-tender; no masses, no organomegaly. Rectal Exam: Not done EXTREMITIES: Right AKA present,  LLE:   No cyanosis, clubbing or edema; no ulcerations. Genitalia: not examined PULSES: 2+ and symmetric SKIN: Normal hydration no rash or ulceration CNS:   Alert and Oriented x 4,  No focal Deficits Vascular: pulses palpable throughout    Labs on Admission:  Basic Metabolic Panel:  Recent Labs Lab 04/15/14 2355  NA 140  K 5.7*  CL 108  CO2 18*  GLUCOSE 128*  BUN 77*  CREATININE 2.02*  CALCIUM 9.0   Liver Function Tests:  Recent Labs Lab 04/15/14 2355  AST 38*  ALT 24  ALKPHOS 119*  BILITOT 0.3  PROT 7.3  ALBUMIN 3.6   No results found for this basename: LIPASE, AMYLASE,  in the last 168 hours No results found for this basename: AMMONIA,  in the last 168 hours CBC:  Recent Labs Lab 04/15/14 2355  WBC 8.7  NEUTROABS 6.9  HGB 10.1*  HCT 31.6*  MCV 72.1*  PLT 207   Cardiac Enzymes: No results found for this basename: CKTOTAL, CKMB, CKMBINDEX, TROPONINI,  in the last 168 hours  BNP (last 3 results)  Recent Labs  03/09/14 2013 04/15/14 2359  PROBNP  4610.0* 3805.0*   CBG: No results found for this basename: GLUCAP,  in the last 168 hours  Radiological Exams on Admission: Dg Chest 2 View  04/16/2014   CLINICAL DATA:  Short of breath.  EXAM: CHEST  2 VIEW  COMPARISON:  03/11/2014  FINDINGS: Hazy bilateral lung opacities with irregular interstitial opacities, consistent with pulmonary edema. There is more confluent lung base opacity likely additional atelectasis associated with small effusions.  Cardiac silhouette is mildly enlarged. Changes from CABG surgery are stable.  No pneumothorax.  Bony thorax is diffusely demineralized. There are stable arthropathic changes of the right shoulder.  IMPRESSION: Congestive heart failure with pulmonary edema and small effusions.   Electronically Signed   By: Lajean Manes M.D.   On: 04/16/2014 00:26     EKG: Independently reviewed.    Assessment/Plan:   80 y.o. female with  Active Problems:   Acute diastolic CHF (congestive heart failure)   DM2 (diabetes mellitus, type 2)   CKD (chronic kidney disease), stage III   HTN (hypertension)   COPD (chronic obstructive pulmonary disease)   CHF (congestive heart failure)    CAD    1.   Acute Diastolic CHF-   Admit , monitor on Telemetry,   Diurese with IV lasix, placed on CHF Protocol.     2.   Hyperkalemia-   Due to Renal Insufficiency,   Kayexalate X 1 given.     3.   COPD Stable.  On Singulair and Claritin Rx.     4.   CKD stage III-   Monitor BUN/Cr.     5.  DM2-    Hold oral  DM meds due to Renal Insufficiency,   SSI coverage PRN.     6.  HTN-  Continue   Amlodipine,  Hydralazine, Lasix Rx,  Monitor BPs,     7.  CAD-  Continue Imdur, ASA, and Zetia RX.     8.  Hyperlipidemia-  On Zetia Rx.     9.  DVT prophylaxis with Lovenox.        Code  Status:    FULL CODE Family Communication:  Family at bedside   Disposition Plan:       Inpatient  Time spent:  [facility] Pager [phone]  If 7PM-7AM,  please contact night-coverage [email or web address] Password [email or web address] 04/16/2014, 2:01 AM

## 2014-04-16 NOTE — ED Notes (Signed)
Admitting MD at BS.  

## 2014-04-16 NOTE — Progress Notes (Signed)
[facility] PROGRESS NOTE  [name] YQM:[id number] DOB: [birth year] DOA: 04/15/2014 PCP: Simona Huh, MD  Assessment/Plan  Acute on chronic respiratory failure with hypoxia secondary to acute on chronic Diastolic CHF -  Telemetry: PVCs, sinus rhythm -  Troponins negative -  Continue IV lasix -  Daily weights and strict ins and outs  Hyperkalemia due to acute on chronic kidney injury -  Resolved after Kayexalate X 1  COPD Stable. On Singulair and Claritin Rx.   CKD stage III, and creatinine trending down slightly -  Daily BMP -  Minimize nephrotoxins and renally dose medications   DM2- Hold oral DM meds due to Renal Insufficiency -  Continue SSI coverage    HTN, blood pressure is elevated but has not received her blood pressure medications -  Restart Amlodipine, Hydralazine, Lasix  CAD- Continue Imdur, ASA, BB, and Zetia RX  Hyperlipidemia- On Zetia Rx.   Microcytic anemia, secondary to iron deficiency but no recent blood work -  Iron studies, B12, folate, TSH -  Continue iron supplement  Depression/anxiety, stable, continue citalopram  Gout, stable, continue allopurinol, renally dosed  Right shoulder pain, likely due to arthritis and contusion from recent fall -  XR right shoulder -  Capsaicin cream -  Tylenol prn  Falls -  PT eval  Diet:  Diabetic, healthy heart Access:  PIV IVF:  Off Proph:  Lovenox  Code Status: DO NOT RESUSCITATE Family Communication: patient alone Disposition Plan: Pending further diuresis   Consultants:  None  Procedures:  Chest x-ray  Antibiotics:  None   HPI/Subjective:  Breathing is much better this morning. Still has some cough. Able to lie fairly flat without shortness of breath while on 3 L nasal cannula. Still has some pain in the right shoulder.     Objective: Filed Vitals:   04/16/14 0130 04/16/14 0145 04/16/14 0200 04/16/14 0238  BP: 174/74 180/66  187/70  Pulse: 65 66 65 67  Temp:    99.4 F  (37.4 C)  TempSrc:    Oral  Resp: 28 23 21 20   Height:    5\' 2"  (1.575 m)  Weight:    84.233 kg (185 lb 11.2 oz)  SpO2: 99% 99% 99% 92%    Intake/Output Summary (Last 24 hours) at 04/16/14 0851 Last data filed at 04/16/14 0300  Gross per 24 hour  Intake      3 ml  Output      0 ml  Net      3 ml   Filed Weights   04/16/14 0238  Weight: 84.233 kg (185 lb 11.2 oz)    Exam:   General:   Caucasian female, No acute distress, nasal cannula in place  HEENT:  NCAT, MMM  Cardiovascular:  RRR, nl S1, S2, 2/6 systolic murmur in the right upper sternal border, radiation to the left sternal border, 2+ pulses, warm extremities  Respiratory:  Rales to the mid back bilaterally, faint wheeze, no rhonchi, no increased WOB  Abdomen:   NABS, soft, NT/ND  MSK:   Decreased tone and bulk,  trace left LEE, right AKA, right shoulder with significant crepitus, tender palpation along the clavicle and supraspinatus muscle, inability to raise arm above head secondary to pain   Neuro:  Grossly intact  Data Reviewed: Basic Metabolic Panel:  Recent Labs Lab 04/15/14 2355 04/16/14 0745  NA 140 144  K 5.7* 4.6  CL 108 108  CO2 18* 19  GLUCOSE 128* 131*  BUN 77* 74*  CREATININE 2.02* 1.98*  CALCIUM 9.0 8.9   Liver Function Tests:  Recent Labs Lab 04/15/14 2355  AST 38*  ALT 24  ALKPHOS 119*  BILITOT 0.3  PROT 7.3  ALBUMIN 3.6   No results found for this basename: LIPASE, AMYLASE,  in the last 168 hours No results found for this basename: AMMONIA,  in the last 168 hours CBC:  Recent Labs Lab 04/15/14 2355  WBC 8.7  NEUTROABS 6.9  HGB 10.1*  HCT 31.6*  MCV 72.1*  PLT 207   Cardiac Enzymes: No results found for this basename: CKTOTAL, CKMB, CKMBINDEX, TROPONINI,  in the last 168 hours BNP (last 3 results)  Recent Labs  03/09/14 2013 04/15/14 2359  PROBNP 4610.0* 3805.0*   CBG:  Recent Labs Lab 04/16/14 0243 04/16/14 0737  GLUCAP 120* 140*    No results  found for this or any previous visit (from the past 240 hour(s)).   Studies: Dg Chest 2 View  04/16/2014   CLINICAL DATA:  Laken Lobato of breath.  EXAM: CHEST  2 VIEW  COMPARISON:  03/11/2014  FINDINGS: Hazy bilateral lung opacities with irregular interstitial opacities, consistent with pulmonary edema. There is more confluent lung base opacity likely additional atelectasis associated with small effusions.  Cardiac silhouette is mildly enlarged. Changes from CABG surgery are stable.  No pneumothorax.  Bony thorax is diffusely demineralized. There are stable arthropathic changes of the right shoulder.  IMPRESSION: Congestive heart failure with pulmonary edema and small effusions.   Electronically Signed   By: Lajean Manes M.D.   On: 04/16/2014 00:26    Scheduled Meds: . allopurinol  100 mg Oral Daily  . amLODipine  10 mg Oral Daily  . carvedilol  3.125 mg Oral BID WC  . citalopram  10 mg Oral Daily  . enoxaparin (LOVENOX) injection  30 mg Subcutaneous Q24H  . ezetimibe  10 mg Oral Daily  . famotidine  20 mg Oral Daily  . furosemide  80 mg Intravenous Q12H  . hydrALAZINE  100 mg Oral TID  . insulin aspart  0-9 Units Subcutaneous 6 times per day  . iron polysaccharides  150 mg Oral BID  . isosorbide mononitrate  60 mg Oral Daily  . loratadine  10 mg Oral Daily  . montelukast  10 mg Oral QHS  . sodium chloride  3 mL Intravenous Q12H  . timolol  1 drop Both Eyes BID   Continuous Infusions:   Active Problems:   Acute diastolic CHF (congestive heart failure)   DM2 (diabetes mellitus, type 2)   CKD (chronic kidney disease), stage III   HTN (hypertension)   COPD (chronic obstructive pulmonary disease)   CHF (congestive heart failure)    Time spent: 30 min    [facility] Pager [phone]. If 7PM-7AM, please contact night-coverage at [email or web address], password [email or web address] 04/16/2014, 8:51 AM  LOS: 1 day

## 2014-04-16 NOTE — Progress Notes (Signed)
Utilization review completed. Kameryn Davern, RN, BSN. 

## 2014-04-17 ENCOUNTER — Encounter (HOSPITAL_COMMUNITY): Payer: Medicare Other

## 2014-04-17 DIAGNOSIS — N189 Chronic kidney disease, unspecified: Secondary | ICD-10-CM

## 2014-04-17 DIAGNOSIS — J449 Chronic obstructive pulmonary disease, unspecified: Secondary | ICD-10-CM

## 2014-04-17 DIAGNOSIS — E119 Type 2 diabetes mellitus without complications: Secondary | ICD-10-CM

## 2014-04-17 DIAGNOSIS — N183 Chronic kidney disease, stage 3 unspecified: Secondary | ICD-10-CM

## 2014-04-17 DIAGNOSIS — I5031 Acute diastolic (congestive) heart failure: Secondary | ICD-10-CM

## 2014-04-17 DIAGNOSIS — N179 Acute kidney failure, unspecified: Secondary | ICD-10-CM

## 2014-04-17 LAB — BASIC METABOLIC PANEL
BUN: 69 mg/dL — AB (ref 6–23)
CHLORIDE: 102 meq/L (ref 96–112)
CO2: 22 meq/L (ref 19–32)
Calcium: 9 mg/dL (ref 8.4–10.5)
Creatinine, Ser: 1.93 mg/dL — ABNORMAL HIGH (ref 0.50–1.10)
GFR calc Af Amer: 26 mL/min — ABNORMAL LOW (ref >90)
GFR calc non Af Amer: 23 mL/min — ABNORMAL LOW (ref >90)
Glucose, Bld: 85 mg/dL (ref 70–99)
Potassium: 3.9 mEq/L (ref 3.7–5.3)
Sodium: 139 mEq/L (ref 137–147)

## 2014-04-17 LAB — GLUCOSE, CAPILLARY
GLUCOSE-CAPILLARY: 79 mg/dL (ref 70–99)
GLUCOSE-CAPILLARY: 101 mg/dL — AB (ref 70–99)
GLUCOSE-CAPILLARY: 161 mg/dL — AB (ref 70–99)
GLUCOSE-CAPILLARY: 235 mg/dL — AB (ref 70–99)
Glucose-Capillary: 111 mg/dL — ABNORMAL HIGH (ref 70–99)
Glucose-Capillary: 141 mg/dL — ABNORMAL HIGH (ref 70–99)

## 2014-04-17 LAB — FOLATE RBC: RBC FOLATE: 783 ng/mL — AB (ref >280)

## 2014-04-17 LAB — HEMOGLOBIN A1C
Hgb A1c MFr Bld: 6.4 % — ABNORMAL HIGH (ref <5.7)
Mean Plasma Glucose: 137 mg/dL — ABNORMAL HIGH (ref <117)

## 2014-04-17 MED ORDER — CARVEDILOL 6.25 MG PO TABS
6.2500 mg | ORAL_TABLET | Freq: Two times a day (BID) | ORAL | Status: DC
Start: 2014-04-17 — End: 2014-04-17

## 2014-04-17 MED ORDER — ISOSORBIDE MONONITRATE ER 60 MG PO TB24
120.0000 mg | ORAL_TABLET | Freq: Every day | ORAL | Status: DC
  Administered 2014-04-18 – 2014-04-21 (×4): 120 mg via ORAL
  Filled 2014-04-17 (×4): qty 2

## 2014-04-17 MED ORDER — CARVEDILOL 3.125 MG PO TABS
3.1250 mg | ORAL_TABLET | Freq: Two times a day (BID) | ORAL | Status: DC
  Administered 2014-04-17 – 2014-04-21 (×8): 3.125 mg via ORAL
  Filled 2014-04-17 (×10): qty 1

## 2014-04-17 MED ORDER — DARBEPOETIN ALFA-POLYSORBATE 100 MCG/0.5ML IJ SOLN
100.0000 ug | Freq: Once | INTRAMUSCULAR | Status: AC
  Administered 2014-04-17: 100 ug via SUBCUTANEOUS
  Filled 2014-04-17 (×2): qty 0.5

## 2014-04-17 NOTE — Progress Notes (Signed)
Heart Failure Navigator Consult Note  Presentation: [name] is a 80 y.o. female with a history of Diastolic CHF, CAD, HTN, CKD Stage III, DM2, Hyperlipidemia, and PVD who presents to the ED with complaints of worsening SOB over the past 2 days. She reports having orthopnea, denies having chest pain, and she has a non-productive cough. She was found to have hypoxemia of 88% in the ED and placed on 3 liters NCO2. A chest X-ray was performed and she was found to have pulmonary edema and small bilateral pleural effusions. Cassandra Pro-BNP was 3805.0. She was administered 40 mg IV Lasix x 1 and given Kayexalate x1 for hyperkalemia of 5.7. She was referred for medical admission.   Past Medical History  Diagnosis Date  . DM (diabetes mellitus) type II controlled peripheral vascular disorder     A1C = 5.8  . HTN (hypertension)   . CHF (congestive heart failure)     EF = 73-71%, grade 2 diastolic dysfunction  . CAD (coronary artery disease)   . COPD (chronic obstructive pulmonary disease)   . Cataracts, bilateral   . Hyperlipidemia   . Peripheral vascular disease bilateral    S/P femoral-popliteal bypass surgery  . Glaucoma   . TIA (transient ischemic attack)   . Shortness of breath   . Pneumonia     HX OF PNA  . Depression   . GERD (gastroesophageal reflux disease)   . Arthritis     RA  . Anemia   . CKD (chronic kidney disease)     History   Social History  . Marital Status: Widowed    Spouse Name: N/A    Number of Children: N/A  . Years of Education: N/A   Social History Main Topics  . Smoking status: Former Smoker -- 1.00 packs/day for 40 years    Types: Cigarettes    Quit date: 04/13/1977  . Smokeless tobacco: Never Used  . Alcohol Use: No  . Drug Use: No  . Sexual Activity: No   Other Topics Concern  . None   Social History Narrative  . None    ECHO:Study Conclusions--03/10/14  - Left ventricle: The cavity size was normal. There was mild concentric hypertrophy.  Systolic function was normal. The estimated ejection fraction was in the range of 60% to 65%. Wall motion was normal; there were no regional wall motion abnormalities. Doppler parameters are consistent with abnormal left ventricular relaxation (grade 1 diastolic dysfunction). - Mitral valve: Poorly visualized. Mild regurgitation. - Left atrium: The atrium was at the upper limits of normal in size. - Right atrium: The atrium was mildly dilated. - Atrial septum: No defect or patent foramen ovale was identified. - Inferior vena cava: The vessel was dilated; the respirophasic diameter changes were blunted (< 50%); findings are consistent with elevated central venous pressure. - Pericardium, extracardiac: There was no pericardial effusion.   BNP    Component Value Date/Time   PROBNP 3805.0* 04/15/2014 2359    Education Assessment and Provision:  Detailed education and instructions provided on heart failure disease management including the following:  Signs and symptoms of Heart Failure When to call the physician Importance of daily weights Low sodium diet  Fluid restriction Medication management Anticipated future follow-up appointments  Patient education given on each of the above topics.  Patient was asleep --[name] acknowledges understanding and acceptance of all instructions.  [name] is known to me from prior admission.  [name] works as an Therapist, sports on  [employer] and [name] lives with Cassandra.  She says that recently someone has been checking on Cassandra grandmother during the day while she is at work.  She has promised Cassandra grandmother that she will try taking Cassandra home one more time--as it seems they are not managing very well.  She has requested a hospital bed for home with scales--as she says currently they do not weigh at home.   I will investigate whether or not that is available.  She also says that she does Cassandra grandmother's pill box and checks to be  sure meds are taken correctly.  She attributes this exacerbation to decrease in lasix dose after a rise in Cassandra grandmother's creatinine.  She also says that Cassandra grandmother is not very good with symptom recognition and does not complain of SOB until very progressed.  Education Materials:  "Living Better With Heart Failure" Booklet, Daily Weight Tracker Tool    High Risk Criteria for Readmission and/or Poor Patient Outcomes:   EF <30%- No--60-65% Grade 1 dias. Dys.-- 03/10/14  2 or more admissions in 6 months- Yes  Difficult social situation- No--however it does seem handling Cassandra care in the home has become more challenging for caregivers  Demonstrates medication noncompliance- No    Barriers of Care:  Knowledge of HF--compliance--?level of care while granddaughter is at work and she is alone.  Discharge Planning:  Current plan is to discharge to home with granddaughter who works 12 hour shifts here at [facility] as RN.--HH is set up.

## 2014-04-17 NOTE — Progress Notes (Addendum)
[facility] PROGRESS NOTE  [name] NID:[id number] DOB: Nov 16, 1926 DOA: 04/15/2014 PCP: Simona Huh, MD  Assessment/Plan  Acute on chronic respiratory failure with hypoxia secondary to acute on chronic diastolic CHF, grade 1/2, last ECHO 03/10/14 -  Telemetry: PVCs, sinus rhythm -  Troponins negative -  Continue IV lasix -  Wt down 3kg  84.2 > 81.2kg -  I/O not strictly recorded  -  Patient still appears hypervolemic, labs and VS not suggesting euvolemia or nearing hypovolemia either.  Hyperkalemia due to acute on chronic kidney injury -  Resolved after Kayexalate X 1  COPD Stable. On Singulair and Claritin Rx.   CKD stage III, and creatinine trending down slightly with diuresis -  Daily BMP -  Minimize nephrotoxins and renally dose medications   DM2- Hold oral DM meds due to Renal Insufficiency -  Continue SSI coverage    HTN, blood pressure is elevated but has not received her blood pressure medications -  Continue Amlodipine, Hydralazine, Lasix  CAD- Continue Imdur, ASA, BB, and Zetia RX  Hyperlipidemia- On Zetia Rx.   Microcytic anemia, secondary to iron deficiency but no recent blood work -  Iron studies consistent with anemia of chronic inflammation, B12 511, folate 783, TSH as below -  Continue iron supplement -  Procrit 20K today  Sick euthyroid, TSH 4.92 but free T4 is 1.15 and free T3-2 0.3, within normal limits. Slightly sick euthyroid, repeat TFTs in approximately 4 weeks.  Depression/anxiety, stable, continue citalopram  Gout, stable, continue allopurinol, renally dosed  Right shoulder pain, likely due to arthritis and contusion from recent fall -  XR right shoulder:  Osteoarthritis, no fracture -  Capsaicin cream -  Tylenol prn  Falls -  PT eval -  Patient will need home health PT, OT, RN, social work, hospital bed  Diet:  Diabetic, healthy heart Access:  PIV IVF:  Off Proph:  Lovenox  Code Status: DO NOT RESUSCITATE Family  Communication: patient alone Disposition Plan: Pending further diuresis   Consultants:  None  Procedures:  Chest x-ray  Antibiotics:  None   HPI/Subjective:  Breathing improving.  Less cough.  Shoulder not bothering her as much, but her left heel and big toe are hurting.     Objective: Filed Vitals:   04/16/14 1412 04/16/14 1759 04/16/14 2100 04/17/14 0444  BP: 145/52 156/58 155/75 163/51  Pulse: 72 70 65 71  Temp: 98.6 F (37 C)  99.1 F (37.3 C) 98.8 F (37.1 C)  TempSrc: Oral  Oral Oral  Resp: 16  16 18   Height:      Weight:    81.285 kg (179 lb 3.2 oz)  SpO2: 97%  98% 94%    Intake/Output Summary (Last 24 hours) at 04/17/14 0701 Last data filed at 04/16/14 2200  Gross per 24 hour  Intake    723 ml  Output    650 ml  Net     73 ml   Filed Weights   04/16/14 0238 04/17/14 0444  Weight: 84.233 kg (185 lb 11.2 oz) 81.285 kg (179 lb 3.2 oz)    Exam:   General:   Caucasian female, No acute distress, nasal cannula in place  HEENT:  NCAT, MMM  Cardiovascular:  RRR, nl S1, S2, 2/6 systolic murmur in the right upper sternal border, radiation to the left sternal border, 2+ pulses, warm extremities  Respiratory:  Rales at bases, faint wheeze, no rhonchi, no increased WOB  Abdomen:   NABS, soft, NT/ND  MSK:   Decreased tone and bulk,  trace left LEE, right AKA, right shoulder with significant crepitus, tender palpation along the clavicle and supraspinatus muscle, inability to raise arm above head secondary to pain.  Left foot heel with mildly tender callous, big toe tender at nail tip, no erythema or pus  Neuro:  Grossly intact  Data Reviewed: Basic Metabolic Panel:  Recent Labs Lab 04/15/14 2355 04/16/14 0745 04/17/14 0532  NA 140 144 139  K 5.7* 4.6 3.9  CL 108 108 102  CO2 18* 19 22  GLUCOSE 128* 131* 85  BUN 77* 74* 69*  CREATININE 2.02* 1.98* 1.93*  CALCIUM 9.0 8.9 9.0   Liver Function Tests:  Recent Labs Lab 04/15/14 2355  AST 38*   ALT 24  ALKPHOS 119*  BILITOT 0.3  PROT 7.3  ALBUMIN 3.6   No results found for this basename: LIPASE, AMYLASE,  in the last 168 hours No results found for this basename: AMMONIA,  in the last 168 hours CBC:  Recent Labs Lab 04/15/14 2355  WBC 8.7  NEUTROABS 6.9  HGB 10.1*  HCT 31.6*  MCV 72.1*  PLT 207   Cardiac Enzymes: No results found for this basename: CKTOTAL, CKMB, CKMBINDEX, TROPONINI,  in the last 168 hours BNP (last 3 results)  Recent Labs  03/09/14 2013 04/15/14 2359  PROBNP 4610.0* 3805.0*   CBG:  Recent Labs Lab 04/16/14 1130 04/16/14 1625 04/16/14 2128 04/17/14 0033 04/17/14 0429  GLUCAP 149* 108* 246* 111* 79    No results found for this or any previous visit (from the past 240 hour(s)).   Studies: Dg Chest 2 View  04/16/2014   CLINICAL DATA:  Law Corsino of breath.  EXAM: CHEST  2 VIEW  COMPARISON:  03/11/2014  FINDINGS: Hazy bilateral lung opacities with irregular interstitial opacities, consistent with pulmonary edema. There is more confluent lung base opacity likely additional atelectasis associated with small effusions.  Cardiac silhouette is mildly enlarged. Changes from CABG surgery are stable.  No pneumothorax.  Bony thorax is diffusely demineralized. There are stable arthropathic changes of the right shoulder.  IMPRESSION: Congestive heart failure with pulmonary edema and small effusions.   Electronically Signed   By: Lajean Manes M.D.   On: 04/16/2014 00:26   Dg Shoulder Right  04/16/2014   CLINICAL DATA:  Pain with recent trauma  EXAM: RIGHT SHOULDER - 2+ VIEW  COMPARISON:  None.  FINDINGS: Frontal and Y scapular images were obtained. There is no apparent acute fracture or dislocation. There is marked narrowing of the glenohumeral joint with subchondral sclerosis on both sides of the glenohumeral joint. There is mild osteoarthritic change in the acromioclavicular joint. No erosive change or bony destruction.  IMPRESSION: Severe osteoarthritic  change in the glenohumeral joint. Mild osteoarthritic change in the acromioclavicular joint. No fracture or dislocation appreciable.   Electronically Signed   By: Lowella Grip M.D.   On: 04/16/2014 11:14    Scheduled Meds: . allopurinol  100 mg Oral Daily  . amLODipine  10 mg Oral Daily  . capsaicin   Topical BID  . carvedilol  3.125 mg Oral BID WC  . citalopram  10 mg Oral Daily  . enoxaparin (LOVENOX) injection  30 mg Subcutaneous Q24H  . ezetimibe  10 mg Oral Daily  . famotidine  20 mg Oral Daily  . furosemide  80 mg Intravenous Q12H  . hydrALAZINE  100 mg Oral TID  . insulin aspart  0-9 Units Subcutaneous 6 times per day  .  iron polysaccharides  150 mg Oral BID  . isosorbide mononitrate  60 mg Oral Daily  . loratadine  10 mg Oral Daily  . montelukast  10 mg Oral QHS  . sodium chloride  3 mL Intravenous Q12H  . timolol  1 drop Both Eyes BID   Continuous Infusions:   Active Problems:   Acute diastolic CHF (congestive heart failure)   DM2 (diabetes mellitus, type 2)   CKD (chronic kidney disease), stage III   HTN (hypertension)   COPD (chronic obstructive pulmonary disease)   CHF (congestive heart failure)    Time spent: 30 min    [facility] Pager [phone]. If 7PM-7AM, please contact night-coverage at [email or web address], password [facility] 04/17/2014, 7:01 AM  LOS: 2 days

## 2014-04-17 NOTE — Care Management Note (Signed)
    Page 1 of 2   04/20/2014     3:49:44 PM CARE MANAGEMENT NOTE 04/20/2014  Patient:  [name], [name]   Account Number:  [id number]  Date Initiated:  04/17/2014  Documentation initiated by:  GRAVES-BIGELOW,Idonna Heeren  Subjective/Objective Assessment:   Pt with hx of Diastolic CHF, CAD, HTN, CKD Stage III, DM2, Hyperlipidemia, and PVD who presents to the ED with complaints of worsening SOB over the past 2 days.  Pt is from home with granddaughter.     Action/Plan:   Pt currently active with [facility] for HH RN,PT,OT. CSW has been added to services. Pt has DME- [place], BSC/ motorized wc. Pt will need DME order written for Hospital Bed before d/c. DME to be delivered to home before d/c. Pt may qualify for home 02.   Anticipated DC Date:  04/19/2014   Anticipated DC Plan:  [place]  CM consult      [place]   Choice offered to / List presented to:  C-2 HC POA / Guardian   DME arranged  [place]      DME agency  [place] arranged  HH-1 RN  HH-10 DISEASE MANAGEMENT  HH-2 PT  HH-3 OT  [place]      [place].   Status of service:  In process, will continue to follow Medicare Important Message given?  YES (If response is "NO", the following Medicare IM given date fields will be blank) Date Medicare IM given:  04/17/2014 Date Additional Medicare IM given:    Discharge Disposition:  IP REHAB FACILITY  Per UR Regulation:  Reviewed for med. necessity/level of care/duration of stay  If discussed at [place] of Stay Meetings, dates discussed:   04/21/2014    Comments:  04-20-14 1548 Jacqlyn Krauss, RN,BSN [phone] Pt agreeable to CIR. Plan for D/c to CIR in am. No further needs from CM at this time.     04-17-14 [place], RN, BSN [phone] Medicare IM signed by pt's granddaughter and copy provided to  pt and placed in shadow chart. Pt has personal care services via [facility] from [place] - 3p. CM did call Liaison for [facility] DME [place]- he will call office to see if beds have a scale. Pt is an amputee R AKA.  Will continue to f/u.

## 2014-04-17 NOTE — Evaluation (Signed)
Occupational Therapy Evaluation Patient Details Name: [name] MRN: [id number] DOB: 03-13-1927 Today's Date: 04/17/2014    History of Present Illness [name] is a 80 y.o. female with a history of Diastolic CHF, CAD, HTN, CKD Stage III, DM2, Hyperlipidemia, and PVD who presents to the ED with complaints of worsening SOB over the past 2 days. She was found to have hypoxemia of 88% in the ED and placed on 3 liters NCO2.  Chest x-ray revealed pulmonary edema and small pleural effusions.   PT with h/o Rt. AKA.     Clinical Impression   Pt admitted with above. She demonstrates the below listed deficits and will benefit from continued OT to maximize safety and independence with BADLs.  Pt presents to OT with generalized weakness.  Currently, she requires max A for functional transfers and max A for BADLs.  She strongly wishes to discharge home, and grand daughter is committed to attempting to provide needed assist for pt at discharge.  She would likely benefit from either performing scoot, or sliding board transfers at home.  Recommend 24 hour supervision/assist and HHOT     Follow Up Recommendations  Home health OT;Supervision/Assistance - 24 hour    Equipment Recommendations  Hospital bed    Recommendations for Other Services       Precautions / Restrictions Precautions Precautions: Fall      Mobility Bed Mobility               General bed mobility comments: Pt sitting EOB upon arrival   Transfers Overall transfer level: Needs assistance Equipment used: 1 person hand held assist Transfers: Sit to/from [employer] Sit to Stand: Mod assist;Max assist   Squat pivot transfers: Max assist     General transfer comment: Pt moved sit to stand with mod A from bed, and required max A to stand from chair.     Balance Overall balance assessment: Needs assistance Sitting-balance support: No upper extremity supported Sitting balance-Leahy Scale: Good      Standing balance support: Bilateral upper extremity supported Standing balance-Leahy Scale: Zero                              ADL Overall ADL's : Needs assistance/impaired Eating/Feeding: Independent;Sitting   Grooming: Wash/dry hands;Set up;Sitting   Upper Body Bathing: Moderate assistance;Sitting   Lower Body Bathing: Maximal assistance;Sit to/from stand   Upper Body Dressing : Moderate assistance;Sitting   Lower Body Dressing: Maximal assistance;Sit to/from stand Lower Body Dressing Details (indicate cue type and reason): Pt was able to don sock with min A in long sitting  Toilet Transfer: Maximal assistance;Squat-pivot;BSC   Toileting- Clothing Manipulation and Hygiene: Total assistance;Sit to/from stand       Functional mobility during ADLs: Maximal assistance General ADL Comments: Spoke with pt re: wearing shoe at home to protect Lt. foot during transfers.  she reports she is not able to pivot as easily with shoe on, but is willing to try when she gets home.  [place] daughter present during eval and is aware of pt current status.  Discussed options of performing sliding board or scoot transfers at [place]      Pertinent Vitals/Pain No c/o pain      Hand Dominance Right  Extremity/Trunk Assessment Upper Extremity Assessment Upper Extremity Assessment: RUE deficits/detail;LUE deficits/detail RUE Deficits / Details: Pt with h/o RA.  Shoulders limite to ~60* elevation.  Rt hand with swan neck deformities and limited flexion LUE Deficits / Details: Pt with h/o RA.  Shoulder limitation limited to 50-60* elevation   Lower Extremity Assessment Lower Extremity Assessment: RLE deficits/detail;Defer to PT evaluation RLE Deficits / Details: Rt. AKA       Communication Communication Communication: No difficulties   Cognition Arousal/Alertness: Awake/alert Behavior During Therapy: WFL for tasks  assessed/performed Overall Cognitive Status: Within Functional Limits for tasks assessed                     General Comments       Exercises       Shoulder Instructions      Home Living Family/patient expects to be discharged to:: Private residence Living Arrangements: Other (Comment) (grand dtr who is RN on 3W) Available Help at Discharge: Family;Personal care attendant;Available PRN/intermittently Type of Home: House Home Access: Ramped entrance     Home Layout: One level     Bathroom Shower/Tub: Tub/shower unit Shower/tub characteristics: Architectural technologist: Standard     Home Equipment: Shower seat;Bedside commode;Wheelchair - power;Wheelchair - manual   Additional Comments: Pt had PC 9-3 M-F; 9-2 Sat; 9-1 on Sun.       Prior Functioning/Environment Level of Independence: Needs assistance  Gait / Transfers Assistance Needed: Pt in non ambulatory.  Only pivots to w/c and BSC ADL's / Homemaking Assistance Needed: Pt able to bathe UB, mod A with LB; dons sock with min - mod A (dependeng on day).  Requires assist with grooming and UB dressing due to limitations with shoulders.  Pt with h/o falls at home        OT Diagnosis: Generalized weakness   OT Problem List: Decreased strength;Decreased activity tolerance;Impaired balance (sitting and/or standing);Decreased knowledge of use of DME or AE;Decreased safety awareness;Obesity;Impaired UE functional use   OT Treatment/Interventions: Self-care/ADL training;DME and/or AE instruction;Therapeutic activities;Patient/family education;Balance training;Therapeutic exercise    OT Goals(Current goals can be found in the care plan section) Acute Rehab OT Goals Patient Stated Goal: "I want to get up and do what I was doing before"  "I don't want to stay in the bed" OT Goal Formulation: With patient/family Time For Goal Achievement: 05/01/14 Potential to Achieve Goals: Good ADL Goals Pt Will Transfer to Toilet:  with mod assist;squat pivot transfer;bedside commode Pt Will Perform Toileting - Clothing Manipulation and hygiene: with mod assist;sit to/from stand Pt/caregiver will Perform Home Exercise Program: Both right and left upper extremity;With minimal assist (AAROM shoulders, AROM elbows distally )  OT Frequency: Min 2X/week   Barriers to D/C: Decreased caregiver support          Co-evaluation              End of Session Nurse Communication: Mobility status  Activity Tolerance: Patient tolerated treatment well Patient left: in chair;with call bell/phone within reach;with chair alarm set;with family/visitor present   Time: 1660-6301 OT Time Calculation (min): 64 min Charges:  OT General Charges $OT Visit: 1 Procedure OT Evaluation $Initial OT Evaluation Tier I: 1 Procedure OT Treatments $Self Care/Home Management : 8-22 mins $Therapeutic Activity: 38-52 mins G-Codes:    Kati Riggenbach M Brevyn Ring May 01, 2014, 2:40 PM

## 2014-04-17 NOTE — Progress Notes (Signed)
[facility]  Patient Status: Active (receiving services up to time of hospitalization)  [facility] is providing the following services: RN, MSW and HHA  If patient discharges after hours, please call [phone].   Hansel Feinstein 04/17/2014, 10:17 AM

## 2014-04-17 NOTE — Evaluation (Signed)
Physical Therapy Evaluation Patient Details Name: APARNA VANDERWEELE MRN: [id number] DOB: 12-06-1926 Today's Date: 04/17/2014   History of Present Illness  JOSCELYNN BRUTUS is a 80 y.o. female with a history of Diastolic CHF, CAD, HTN, CKD Stage III, DM2, Hyperlipidemia, and PVD who presents to the ED with complaints of worsening SOB over the past 2 days. She was found to have hypoxemia of 88% in the ED and placed on 3 liters NCO2.  Chest x-ray revealed pulmonary edema and small pleural effusions.   PT with h/o Rt. AKA.    Clinical Impression  Pt is continuing to drop to 88% on RA during mobility.  O2 [place] re-applied after session.  Pt was unable to stand supported from recliner chair, but she normally scoots to get into her electric WC and her drop arm BSC.  We will need to set up this simulation with one of our [facility] next session.  She may be appropriate for CIR level therapies to work on basic transfers, weaning from O2 and general strengthening and safety with transfers.  Will get CIR to screen pr for appropriateness.     Follow Up Recommendations CIR    Equipment Recommendations  None recommended by PT    Recommendations for Other Services Rehab consult     Precautions / Restrictions Precautions Precautions: Fall Precaution Comments: R AKA, doesn't stand or walk      Mobility  Bed Mobility               General bed mobility comments: Pt sitting EOB upon arrival   Transfers Overall transfer level: Needs assistance Equipment used: None Transfers: Sit to/from Stand Sit to Stand: Max assist   Squat pivot transfers: Max assist     General transfer comment: Attempted x 3 from low recliner chair.  Unable with gait belt or with bed pad in chair.  Pt pushes posteriorly when attempting to stand from low recliner.   Ambulation/Gait             General Gait Details: unable, non ambulatory x 3 years   Information systems manager mobility:  (uses a power  chair at home)         Balance Overall balance assessment: Needs assistance Sitting-balance support: No upper extremity supported Sitting balance-Leahy Scale: Good     Standing balance support: Bilateral upper extremity supported Standing balance-Leahy Scale: Zero                               Pertinent Vitals/Pain See vitals flow sheet. O2 sats ranged from 88-94% on RA.  No DOE.  1 L O2 [place] re-applied at end of session.     Home Living Family/patient expects to be discharged to:: Private residence Living Arrangements: Other (Comment) (granddaughter who is RN on 3W-[name]) Available Help at Discharge: Family;Personal care attendant;Available PRN/intermittently Type of Home: House Home Access: Ramped entrance     Home Layout: One level Home Equipment: Shower seat;Bedside commode;Wheelchair - power;Wheelchair - manual (Drop arm BSC) Additional Comments: Pt had PC 9-3 M-F; 9-2 Sat; 9-1 on Sun.     Prior Function Level of Independence: Needs assistance   Gait / Transfers Assistance Needed: Pt in non ambulatory.  Only pivots to w/c and BSC.  Has not been able to walk for 3 years.  She reports she scoots to electric WC and scoots to drop arm BSC  ADL's / Homemaking Assistance Needed: Pt able  to bathe UB, mod A with LB; dons sock with min - mod A (dependeng on day).  Requires assist with grooming and UB dressing due to limitations with shoulders.  Pt with h/o falls at home        Hand Dominance   Dominant Hand: Right    Extremity/Trunk Assessment   Upper Extremity Assessment: Defer to OT evaluation RUE Deficits / Details: Pt with h/o RA.  Shoulders limite to ~60* elevation.  Rt hand with swan neck deformities and limited flexion     LUE Deficits / Details: Pt with h/o RA.  Shoulder limitation limited to 50-60* elevation   Lower Extremity Assessment: RLE deficits/detail;LLE deficits/detail RLE Deficits / Details: R AKA LLE Deficits / Details: Left leg weak  at least 3/5 strength ankle, knee, and hip, but she is unable to support her weight over that leg without quite a bit of assist.      Communication   Communication: No difficulties  Cognition Arousal/Alertness: Awake/alert Behavior During Therapy: WFL for tasks assessed/performed Overall Cognitive Status: Within Functional Limits for tasks assessed       Memory:  (possibly mild STM deficits, had difficulty with situation)              General Comments General comments (skin integrity, edema, etc.): attempted to test pt on RA, however, with mobility O2 still dropping into the upper 80s (88%) on RA. I did turn her O2 down to 1 L O2 [place] (RN made aware) in an attempt to wean.      Exercises General Exercises - Lower Extremity Long Arc Quad: AROM;Left;10 reps;Seated Hip Flexion/Marching: AROM;Left;10 reps;Seated Toe Raises: AROM;Left;10 reps;Seated Heel Raises: AROM;Left;10 reps;Seated      Assessment/Plan    PT Assessment Patient needs continued PT services  PT Diagnosis Difficulty walking;Abnormality of gait;Generalized weakness   PT Problem List Decreased strength;Decreased activity tolerance;Decreased balance;Decreased mobility;Cardiopulmonary status limiting activity;Obesity  PT Treatment Interventions DME instruction;Functional mobility training;Therapeutic activities;Therapeutic exercise;Balance training;Patient/family education;Neuromuscular re-education   PT Goals (Current goals can be found in the Care Plan section) Acute Rehab PT Goals Patient Stated Goal: To stay as independent as possible.  PT Goal Formulation: With patient Time For Goal Achievement: 05/01/14 Potential to Achieve Goals: Good    Frequency Min 3X/week    End of Session Equipment Utilized During Treatment: Gait belt;Oxygen Activity Tolerance: Patient tolerated treatment well Patient left: in chair;with call bell/phone within reach;with chair alarm set Nurse Communication: Mobility status;Need for  lift equipment         Time: 6808-8110 PT Time Calculation (min): 22 min   Charges:   PT Evaluation $Initial PT Evaluation Tier I: 1 Procedure PT Treatments $Therapeutic Activity: 8-22 mins       Endia Moncur B. [place], [place], DPT [phone]   04/17/2014, 4:59 PM

## 2014-04-17 NOTE — Progress Notes (Signed)
Rehab Admissions Coordinator Note:  Patient was screened by Cleatrice Burke for appropriateness for an Inpatient Acute Rehab Consult per PT recommendation.  At this time, we are recommending Inpatient Rehab consult if granddaughter and pt would like to pursue. Please order if you feel appropriate.  Audelia Acton [facility] 04/17/2014, 7:18 PM  I can be reached at [phone].

## 2014-04-17 NOTE — Progress Notes (Signed)
[facility]  Patient Status: Active (receiving services up to time of hospitalization)  [facility] is providing the following services: RN, PT and OT.  Please disregard the previous note from [facility] since incorrected services were documented in that note.  If patient discharges after hours, please call [phone].   Hansel Feinstein 04/17/2014, 10:18 AM

## 2014-04-18 ENCOUNTER — Inpatient Hospital Stay (HOSPITAL_COMMUNITY): Payer: Medicare Other

## 2014-04-18 LAB — GLUCOSE, CAPILLARY
GLUCOSE-CAPILLARY: 178 mg/dL — AB (ref 70–99)
GLUCOSE-CAPILLARY: 180 mg/dL — AB (ref 70–99)
Glucose-Capillary: 101 mg/dL — ABNORMAL HIGH (ref 70–99)
Glucose-Capillary: 137 mg/dL — ABNORMAL HIGH (ref 70–99)
Glucose-Capillary: 73 mg/dL (ref 70–99)
Glucose-Capillary: 96 mg/dL (ref 70–99)

## 2014-04-18 LAB — BASIC METABOLIC PANEL
BUN: 80 mg/dL — ABNORMAL HIGH (ref 6–23)
CO2: 24 mEq/L (ref 19–32)
Calcium: 8.5 mg/dL (ref 8.4–10.5)
Chloride: 103 mEq/L (ref 96–112)
Creatinine, Ser: 2.03 mg/dL — ABNORMAL HIGH (ref 0.50–1.10)
GFR, EST AFRICAN AMERICAN: 25 mL/min — AB (ref >90)
GFR, EST NON AFRICAN AMERICAN: 21 mL/min — AB (ref >90)
Glucose, Bld: 96 mg/dL (ref 70–99)
Potassium: 3.9 mEq/L (ref 3.7–5.3)
Sodium: 142 mEq/L (ref 137–147)

## 2014-04-18 MED ORDER — FUROSEMIDE 40 MG PO TABS
40.0000 mg | ORAL_TABLET | Freq: Two times a day (BID) | ORAL | Status: DC
  Administered 2014-04-18 – 2014-04-21 (×7): 40 mg via ORAL
  Filled 2014-04-18 (×9): qty 1

## 2014-04-18 MED ORDER — SENNA 8.6 MG PO TABS
2.0000 | ORAL_TABLET | Freq: Every day | ORAL | Status: DC
  Administered 2014-04-18 – 2014-04-19 (×2): 17.2 mg via ORAL
  Filled 2014-04-18 (×4): qty 2

## 2014-04-18 MED ORDER — DOCUSATE SODIUM 100 MG PO CAPS
100.0000 mg | ORAL_CAPSULE | Freq: Two times a day (BID) | ORAL | Status: DC
  Administered 2014-04-18 – 2014-04-20 (×5): 100 mg via ORAL
  Filled 2014-04-18 (×8): qty 1

## 2014-04-18 NOTE — Progress Notes (Signed)
[facility] PROGRESS NOTE  [name] NWG:[id number] DOB: 06-02-1927 DOA: 04/15/2014 PCP: Simona Huh, MD  Assessment/Plan  Acute on chronic respiratory failure with hypoxia secondary to acute on chronic diastolic CHF, grade 1/2, last ECHO 03/10/14.  BUN and creatinine rising -  Telemetry: PVCs, sinus rhythm -  Troponins negative -  D/c IV lasix and start lasix 80mg  po BID  -  Wt down 3kg  84.2 > 81.2kg > 83.6 -  I/O -1.16L -  Patient still appears hypervolemic, labs and VS not suggesting euvolemia or nearing hypovolemia either.  Hyperkalemia due to acute on chronic kidney injury -  Resolved after Kayexalate X 1  COPD Stable. On Singulair and Claritin Rx.   CKD stage III, and BUN and creatinine rising -  Daily BMP -  Minimize nephrotoxins and renally dose medications   DM2- Hold oral DM meds due to Renal Insufficiency -  Continue SSI coverage    HTN, blood pressure is elevated but has not received her blood pressure medications -  Continue Amlodipine, Hydralazine, Lasix  CAD- Continue Imdur, ASA, BB, and Zetia RX  Hyperlipidemia- On Zetia Rx.   Microcytic anemia, secondary to iron deficiency but no recent blood work -  Iron studies consistent with anemia of chronic inflammation, B12 511, folate 783, TSH as below -  Continue iron supplement -  Procrit 20K today  Sick euthyroid, TSH 4.92 but free T4 is 1.15 and free T3-2 0.3, within normal limits. Slightly sick euthyroid, repeat TFTs in approximately 4 weeks.  Depression/anxiety, stable, continue citalopram  Gout, stable, continue allopurinol, renally dosed  Right shoulder pain, likely due to arthritis and contusion from recent fall -  XR right shoulder:  Osteoarthritis, no fracture -  Capsaicin cream -  Tylenol prn  Falls, patient will need home health PT, OT, RN, social work, hospital bed  Diet:  Diabetic, healthy heart Access:  PIV IVF:  Off Proph:  Lovenox  Code Status: DO NOT RESUSCITATE Family  Communication: patient alone Disposition Plan:  Possibly home tomorrow  Consultants:  None  Procedures:  Chest x-ray  Antibiotics:  None   HPI/Subjective:  Breathing improving.  Less cough.  Shoulder and foot not bothering her as much  Objective: Filed Vitals:   04/17/14 1915 04/17/14 2024 04/18/14 0545 04/18/14 0818  BP:  151/53 146/51 140/60  Pulse:  67 64 68  Temp:  99 F (37.2 C) 98.6 F (37 C)   TempSrc:  Oral Oral   Resp:  16 16   Height:      Weight:   83.689 kg (184 lb 8 oz)   SpO2: 93% 93% 93%     Intake/Output Summary (Last 24 hours) at 04/18/14 0935 Last data filed at 04/18/14 0546  Gross per 24 hour  Intake      0 ml  Output   1000 ml  Net  -1000 ml   Filed Weights   04/16/14 0238 04/17/14 0444 04/18/14 0545  Weight: 84.233 kg (185 lb 11.2 oz) 81.285 kg (179 lb 3.2 oz) 83.689 kg (184 lb 8 oz)    Exam:   General:   Caucasian female, No acute distress, nasal cannula in place  HEENT:  NCAT, MMM  Cardiovascular:  RRR, nl S1, S2, 2/6 systolic murmur in the right upper sternal border, radiation to the left sternal border, 2+ pulses, warm extremities  Respiratory:  Rales at bases, faint wheeze, no rhonchi, no increased WOB  Abdomen:   NABS, soft, NT/ND  MSK:  Decreased tone and bulk,  trace left LEE, right AKA, right shoulder with significant crepitus, tender palpation along the clavicle and supraspinatus muscle, inability to raise arm above head secondary to pain.  Left foot heel with mildly tender callous, big toe tender at nail tip, no erythema or pus  Neuro:  Grossly intact  Data Reviewed: Basic Metabolic Panel:  Recent Labs Lab 04/15/14 2355 04/16/14 0745 04/17/14 0532 04/18/14 0500  NA 140 144 139 142  K 5.7* 4.6 3.9 3.9  CL 108 108 102 103  CO2 18* 19 22 24   GLUCOSE 128* 131* 85 96  BUN 77* 74* 69* 80*  CREATININE 2.02* 1.98* 1.93* 2.03*  CALCIUM 9.0 8.9 9.0 8.5   Liver Function Tests:  Recent Labs Lab 04/15/14 2355   AST 38*  ALT 24  ALKPHOS 119*  BILITOT 0.3  PROT 7.3  ALBUMIN 3.6   No results found for this basename: LIPASE, AMYLASE,  in the last 168 hours No results found for this basename: AMMONIA,  in the last 168 hours CBC:  Recent Labs Lab 04/15/14 2355  WBC 8.7  NEUTROABS 6.9  HGB 10.1*  HCT 31.6*  MCV 72.1*  PLT 207   Cardiac Enzymes: No results found for this basename: CKTOTAL, CKMB, CKMBINDEX, TROPONINI,  in the last 168 hours BNP (last 3 results)  Recent Labs  03/09/14 2013 04/15/14 2359  PROBNP 4610.0* 3805.0*   CBG:  Recent Labs Lab 04/17/14 1639 04/17/14 2016 04/18/14 0007 04/18/14 0411 04/18/14 0749  GLUCAP 161* 235* 73 101* 96    No results found for this or any previous visit (from the past 240 hour(s)).   Studies: Dg Shoulder Right  04/16/2014   CLINICAL DATA:  Pain with recent trauma  EXAM: RIGHT SHOULDER - 2+ VIEW  COMPARISON:  None.  FINDINGS: Frontal and Y scapular images were obtained. There is no apparent acute fracture or dislocation. There is marked narrowing of the glenohumeral joint with subchondral sclerosis on both sides of the glenohumeral joint. There is mild osteoarthritic change in the acromioclavicular joint. No erosive change or bony destruction.  IMPRESSION: Severe osteoarthritic change in the glenohumeral joint. Mild osteoarthritic change in the acromioclavicular joint. No fracture or dislocation appreciable.   Electronically Signed   By: Lowella Grip M.D.   On: 04/16/2014 11:14    Scheduled Meds: . allopurinol  100 mg Oral Daily  . amLODipine  10 mg Oral Daily  . capsaicin   Topical BID  . carvedilol  3.125 mg Oral BID WC  . citalopram  10 mg Oral Daily  . enoxaparin (LOVENOX) injection  30 mg Subcutaneous Q24H  . ezetimibe  10 mg Oral Daily  . famotidine  20 mg Oral Daily  . furosemide  40 mg Oral BID  . hydrALAZINE  100 mg Oral TID  . insulin aspart  0-9 Units Subcutaneous 6 times per day  . iron polysaccharides  150 mg  Oral BID  . isosorbide mononitrate  120 mg Oral Daily  . loratadine  10 mg Oral Daily  . montelukast  10 mg Oral QHS  . sodium chloride  3 mL Intravenous Q12H  . timolol  1 drop Both Eyes BID   Continuous Infusions:   Active Problems:   Acute diastolic CHF (congestive heart failure)   DM2 (diabetes mellitus, type 2)   CKD (chronic kidney disease), stage III   HTN (hypertension)   COPD (chronic obstructive pulmonary disease)   CHF (congestive heart failure)    Time  spent: 30 min    [facility] Pager [phone]. If 7PM-7AM, please contact night-coverage at [email or web address], password [facility] 04/18/2014, 9:35 AM  LOS: 3 days

## 2014-04-19 LAB — BASIC METABOLIC PANEL
BUN: 84 mg/dL — AB (ref 6–23)
CO2: 23 mEq/L (ref 19–32)
Calcium: 8.6 mg/dL (ref 8.4–10.5)
Chloride: 102 mEq/L (ref 96–112)
Creatinine, Ser: 2.19 mg/dL — ABNORMAL HIGH (ref 0.50–1.10)
GFR calc Af Amer: 22 mL/min — ABNORMAL LOW (ref >90)
GFR, EST NON AFRICAN AMERICAN: 19 mL/min — AB (ref >90)
GLUCOSE: 109 mg/dL — AB (ref 70–99)
Potassium: 4 mEq/L (ref 3.7–5.3)
Sodium: 139 mEq/L (ref 137–147)

## 2014-04-19 LAB — GLUCOSE, CAPILLARY
GLUCOSE-CAPILLARY: 109 mg/dL — AB (ref 70–99)
GLUCOSE-CAPILLARY: 132 mg/dL — AB (ref 70–99)
Glucose-Capillary: 118 mg/dL — ABNORMAL HIGH (ref 70–99)
Glucose-Capillary: 152 mg/dL — ABNORMAL HIGH (ref 70–99)
Glucose-Capillary: 209 mg/dL — ABNORMAL HIGH (ref 70–99)
Glucose-Capillary: 99 mg/dL (ref 70–99)

## 2014-04-19 LAB — OCCULT BLOOD X 1 CARD TO LAB, STOOL: FECAL OCCULT BLD: NEGATIVE

## 2014-04-19 MED ORDER — ISOSORBIDE MONONITRATE ER 120 MG PO TB24: 120.0000 mg | ORAL_TABLET | Freq: Every day | ORAL | Status: DC

## 2014-04-19 MED ORDER — CAPSAICIN 0.025 % EX CREA
TOPICAL_CREAM | Freq: Two times a day (BID) | CUTANEOUS | Status: DC
Start: 2014-04-19 — End: 2014-05-01

## 2014-04-19 MED ORDER — WHITE PETROLATUM GEL
Status: DC | PRN
  Administered 2014-04-20: 01:00:00 via TOPICAL
  Filled 2014-04-19: qty 5

## 2014-04-19 MED ORDER — SALINE SPRAY 0.65 % NA SOLN
1.0000 | NASAL | Status: DC | PRN
  Administered 2014-04-20: 1 via NASAL
  Filled 2014-04-19: qty 44

## 2014-04-19 MED ORDER — FUROSEMIDE 40 MG PO TABS: 40.0000 mg | ORAL_TABLET | Freq: Two times a day (BID) | ORAL | Status: DC

## 2014-04-19 NOTE — Progress Notes (Signed)
[facility] PROGRESS NOTE  [name] QJJ:[id number] DOB: 06-11-1927 DOA: 04/15/2014 PCP: Simona Huh, MD  Assessment/Plan  Acute on chronic respiratory failure with hypoxia secondary to acute on chronic diastolic CHF, grade 1/2, last ECHO 03/10/14.  BUN and creatinine still rising despite cutting back on lasix yesterday -  Telemetry: PVCs, sinus rhythm -  Troponins negative -  Continue lasix 40mg  po BID  -  Wt down 3kg  84.2 > 81.2kg > 83.6 > 82.5 -  I/O +783 mL -  Patient still appears hypervolemic, labs and VS not suggesting euvolemia or nearing hypovolemia either.  CKD stage III, and BUN and creatinine rising, apparently she was recently cut back to once daily lasix bc creatinine trended up to > 3, but then she developed heart failure exacerbation requiring admission.   -  Daily BMP -  Minimize nephrotoxins and renally dose medications -  Nephrology assistance with diuretic management  Hyperkalemia due to acute on chronic kidney injury -  Resolved after Kayexalate X 1  COPD Stable. On Singulair and Claritin Rx.    DM2- Hold oral DM meds due to Renal Insufficiency -  Continue SSI coverage    HTN, blood pressure is elevated but has not received her blood pressure medications -  Continue Amlodipine, Hydralazine, Lasix  CAD- Continue Imdur, ASA, BB, and Zetia RX  Hyperlipidemia- On Zetia Rx.   Microcytic anemia, secondary to iron deficiency but no recent blood work -  Iron studies consistent with anemia of chronic inflammation, B12 511, folate 783, TSH as below -  Continue iron supplement -  Procrit 20K equivalent given 6/6.  Sick euthyroid, TSH 4.92 but free T4 is 1.15 and free T3-2 0.3, within normal limits. Slightly sick euthyroid, repeat TFTs in approximately 4 weeks.  Depression/anxiety, stable, continue citalopram  Gout, stable, continue allopurinol, renally dosed  Right shoulder pain, likely due to arthritis and contusion from recent fall -  XR right  shoulder:  Osteoarthritis, no fracture -  Capsaicin cream -  Tylenol prn  Falls, patient will need home health PT, OT, RN, social work, hospital bed  Diet:  Diabetic, healthy heart Access:  PIV IVF:  Off Proph:  Lovenox  Code Status: DO NOT RESUSCITATE Family Communication: patient alone Disposition Plan:  Will discuss the possibility of rehabilitation with patient and her granddaughter. 2 a conversation with nephrology regarding her diuretic dose.  Consultants:  None  Procedures:  Chest x-ray  Antibiotics:  None   HPI/Subjective:  Breathing improved, she is off her oxygen. She is overall feeling well this morning.  Objective: Filed Vitals:   04/18/14 1409 04/18/14 2100 04/19/14 0513 04/19/14 0736  BP: 118/61 139/44 143/58 168/55  Pulse: 57 55 61 82  Temp: 98.2 F (36.8 C) 98.3 F (36.8 C) 98.1 F (36.7 C)   TempSrc:   Oral   Resp: 18 15 16    Height:      Weight:   82.555 kg (182 lb)   SpO2: 97% 98% 93%     Intake/Output Summary (Last 24 hours) at 04/19/14 1021 Last data filed at 04/19/14 0956  Gross per 24 hour  Intake    966 ml  Output    300 ml  Net    666 ml   Filed Weights   04/17/14 0444 04/18/14 0545 04/19/14 0513  Weight: 81.285 kg (179 lb 3.2 oz) 83.689 kg (184 lb 8 oz) 82.555 kg (182 lb)    Exam:   General:   Caucasian female, No acute  distress  HEENT:  NCAT, MMM  Cardiovascular:  RRR, nl S1, S2, 2/6 systolic murmur in the right upper sternal border, radiation to the left sternal border, 2+ pulses, warm extremities  Respiratory:  Clear to auscultation bilaterally, no rhonchi, no increased WOB  Abdomen:   NABS, soft, NT/ND  MSK:   Decreased tone and bulk,  trace left LEE, right AKA  Neuro:  Grossly intact  Data Reviewed: Basic Metabolic Panel:  Recent Labs Lab 04/15/14 2355 04/16/14 0745 04/17/14 0532 04/18/14 0500 04/19/14 0405  NA 140 144 139 142 139  K 5.7* 4.6 3.9 3.9 4.0  CL 108 108 102 103 102  CO2 18* 19 22 24 23    GLUCOSE 128* 131* 85 96 109*  BUN 77* 74* 69* 80* 84*  CREATININE 2.02* 1.98* 1.93* 2.03* 2.19*  CALCIUM 9.0 8.9 9.0 8.5 8.6   Liver Function Tests:  Recent Labs Lab 04/15/14 2355  AST 38*  ALT 24  ALKPHOS 119*  BILITOT 0.3  PROT 7.3  ALBUMIN 3.6   No results found for this basename: LIPASE, AMYLASE,  in the last 168 hours No results found for this basename: AMMONIA,  in the last 168 hours CBC:  Recent Labs Lab 04/15/14 2355  WBC 8.7  NEUTROABS 6.9  HGB 10.1*  HCT 31.6*  MCV 72.1*  PLT 207   Cardiac Enzymes: No results found for this basename: CKTOTAL, CKMB, CKMBINDEX, TROPONINI,  in the last 168 hours BNP (last 3 results)  Recent Labs  03/09/14 2013 04/15/14 2359  PROBNP 4610.0* 3805.0*   CBG:  Recent Labs Lab 04/18/14 1556 04/18/14 2005 04/19/14 0023 04/19/14 0403 04/19/14 0725  GLUCAP 180* 137* 99 109* 118*    No results found for this or any previous visit (from the past 240 hour(s)).   Studies: Dg Chest Port 1 View  04/18/2014   CLINICAL DATA:  Followup of heart failure after diuresis. coronary artery disease. COPD.  EXAM: PORTABLE CHEST - 1 VIEW  COMPARISON:  04/16/2014  FINDINGS: Prior median sternotomy. Advanced right greater than left glenohumeral joint osteoarthritis. Mild cardiomegaly with a tortuous thoracic aorta. No pleural effusion or pneumothorax. Improved mild interstitial prominence and indistinctness. Mild bibasilar volume loss and atelectasis.  IMPRESSION: Cardiomegaly with improved, mild congestive heart failure.   Electronically Signed   By: Abigail Miyamoto M.D.   On: 04/18/2014 10:09    Scheduled Meds: . allopurinol  100 mg Oral Daily  . amLODipine  10 mg Oral Daily  . capsaicin   Topical BID  . carvedilol  3.125 mg Oral BID WC  . citalopram  10 mg Oral Daily  . docusate sodium  100 mg Oral BID  . enoxaparin (LOVENOX) injection  30 mg Subcutaneous Q24H  . ezetimibe  10 mg Oral Daily  . famotidine  20 mg Oral Daily  .  furosemide  40 mg Oral BID  . hydrALAZINE  100 mg Oral TID  . insulin aspart  0-9 Units Subcutaneous 6 times per day  . iron polysaccharides  150 mg Oral BID  . isosorbide mononitrate  120 mg Oral Daily  . loratadine  10 mg Oral Daily  . montelukast  10 mg Oral QHS  . senna  2 tablet Oral QHS  . sodium chloride  3 mL Intravenous Q12H  . timolol  1 drop Both Eyes BID   Continuous Infusions:   Active Problems:   Acute diastolic CHF (congestive heart failure)   DM2 (diabetes mellitus, type 2)   CKD (chronic  kidney disease), stage III   HTN (hypertension)   COPD (chronic obstructive pulmonary disease)   CHF (congestive heart failure)    Time spent: 30 min    [facility] Pager [phone]. If 7PM-7AM, please contact night-coverage at [email or web address], password [email or web address] 04/19/2014, 10:21 AM  LOS: 4 days

## 2014-04-19 NOTE — Discharge Summary (Signed)
Physician Discharge Summary  [name] JJH:[id number] DOB: [birth year] DOA: 04/15/2014  PCP: Simona Huh, MD  Admit date: 04/15/2014 Discharge date:  pending  Recommendations for Outpatient Follow-up:  1. Followup with primary care doctor in one week for BMP.  Nephrology recommends tolerating a higher creatinine given her easily the patient developed acute on chronic heart failure.  Changed to Lasix 40 mg po twice a day which may be adjusted to keep weight near 82kg.  Continue daily weights and if she gains more than 3-lbs in one day or 5-lbs in one week, call nephrology clinic.   2. Followup with nephrology within one month 3. Please set up for home oxygen at discharge if still desaturating with exertion at time of discharge.   Discharge Diagnoses:  Active Problems:   Acute diastolic CHF (congestive heart failure)   DM2 (diabetes mellitus, type 2)   CKD (chronic kidney disease), stage III   HTN (hypertension)   COPD (chronic obstructive pulmonary disease)   CHF (congestive heart failure)   Discharge Condition: Stable, improved  Diet recommendation: Diabetic/healthy heart  Wt Readings from Last 3 Encounters:  04/19/14 82.555 kg (182 lb)  03/13/14 83.4 kg (183 lb 13.8 oz)  04/28/13 79.379 kg (175 lb)    History of present illness:  [name] is a 80 y.o. female with a history of Diastolic CHF, CAD, HTN, CKD Stage III, DM2, Hyperlipidemia, and PVD who presents to the ED with complaints of worsening SOB over the past 2 days. She reports having orthopnea, denies having chest pain, and she has a non-productive cough. She was found to have hypoxemia of 88% in the ED and placed on 3 liters NCO2. A chest X-ray was performed and she was found to have pulmonary edema and small bilateral pleural effusions. Her Pro-BNP was 3805.0. She was administered 40 mg IV Lasix x 1 and given Kayexalate x1 for hyperkalemia of 5.7. She was referred for medical admission.    Hospital Course:    Acute on chronic respiratory failure with hypoxia secondary to acute on chronic diastolic CHF, grade 1/2, last ECHO 03/10/14.  Her pro BNP was 3805 and she had pulmonary edema and chest x-ray. She was diureses with Lasix 80 mg IV twice a day. Because her ins and outs were not strictly recorded, it is unclear how much fluid she lost. Her weight trended down to approximately 82 kg.  Repeat chest x-ray demonstrated improvement in her pulmonary edema. Her lungs were clear and she did not have lower extremity edema at the time of discharge. She had a mild rise in her creatinine indicating that she was euvolemic.  After discussion with nephrology, recommended that she be discharged on Lasix 40 mg by mouth twice a day and tolerate a higher creatinine to prevent respiratory failure from heart failure.  Filed Weights   04/17/14 0444 04/18/14 0545 04/19/14 0513  Weight: 81.285 kg (179 lb 3.2 oz) 83.689 kg (184 lb 8 oz) 82.555 kg (182 lb)   Hyperkalemia due to acute on chronic any injury. Peak potassium of 5.7. This trended down with one dose of Kayexalate.  CKD stage IV, creatinine initially trended down with diuresis and then trended up slightly. She should have a repeat BMP done in one week. Minimize nephrotoxins and continued to renal dose medications. She should followup with nephrology within one month.  Diabetes mellitus type 2, her oral medications were held at admission. She was started on sliding scale insulin. She may resume her home medications  at discharge.  Hypertension, blood pressure was slightly elevated, however given her age and comorbidities it may be okay to tolerate slowly higher blood pressures. She continued amlodipine, hydralazine, and Lasix.  Her imdur was increased to 120mg .  Coronary artery disease, stable, continued Imdur, aspirin, beta blocker, zetia.  Microcytic anemia, secondary to iron deficiency but no recent blood work. Her iron studies were consistent with anemia of  chronic inflammation, B12 511, folate 73, TSH is below. She continued her iron supplement was given one dose of Procrit.  Sick euthyroid, TSH was 4.92 but free T4 of 1.15 and free T3 of 2.03 were within normal limits.  COPD, stable, continue Singulair and Claritin.  Depression/anxiety, stable, continue citalopram   Gout, stable, continue allopurinol, renally dosed   Right shoulder pain, likely due to arthritis and contusion from recent fall.  XR right shoulder: Osteoarthritis, no fracture.  Started Capsaicin cream and tylenol prn.  Falls, PT recommended CIR.  To rehab in AM.    Consultants:  None Procedures:  Chest x-ray Antibiotics:  None    Discharge Exam: Filed Vitals:   04/20/14 1403  BP: 144/57  Pulse: 57  Temp: 97.9 F (36.6 C)  Resp: 17   Filed Vitals:   04/19/14 2100 04/20/14 0427 04/20/14 1302 04/20/14 1403  BP: 135/57 147/52  144/57  Pulse: 64 66 65 57  Temp: 99.1 F (37.3 C) 98.7 F (37.1 C)  97.9 F (36.6 C)  TempSrc:  Oral  Oral  Resp: 16 14  17   Height:      Weight:      SpO2: 92% 100% 90% 96%    General:  Caucasian female, No acute distress, sitting up, feeling well HEENT:  NCAT, MMM  Cardiovascular:  RRR, nl S1, S2, 2/6 systolic murmur in the right upper sternal border, radiation to the left sternal border, 2+ pulses, warm extremities  Respiratory:  Clear to auscultation bilaterally, no rhonchi, no increased WOB  Abdomen:  NABS, soft, NT/ND  MSK:  Decreased tone and bulk, trace left LEE, right AKA  Neuro:  Grossly intact    Discharge Instructions      Discharge Instructions   (HEART FAILURE PATIENTS) Call MD:  Anytime you have any of the following symptoms: 1) 3 pound weight gain in 24 hours or 5 pounds in 1 week 2) shortness of breath, with or without a dry hacking cough 3) swelling in the hands, feet or stomach 4) if you have to sleep on extra pillows at night in order to breathe.    Complete by:  As directed      Call MD for:   difficulty breathing, headache or visual disturbances    Complete by:  As directed      Call MD for:  extreme fatigue    Complete by:  As directed      Call MD for:  hives    Complete by:  As directed      Call MD for:  persistant dizziness or light-headedness    Complete by:  As directed      Call MD for:  persistant nausea and vomiting    Complete by:  As directed      Call MD for:  severe uncontrolled pain    Complete by:  As directed      Call MD for:  temperature >100.4    Complete by:  As directed      Diet - low sodium heart healthy    Complete by:  As directed      Diet Carb Modified    Complete by:  As directed      Discharge instructions    Complete by:  As directed   You were hospitalized with heart failure exacerbation.  Please try to limit your salt to 2000mg  or less per day and drink about five 8-oz glasses of water per day.  Take your weight daily and record the numbers and if you gain more than 3-lbs in one day or 5-lbs in 7 days call the nephrology office.  You will need bloodwork done in 1 week to check your kidney function and then follow up with nephrology in 1 month or sooner as needed.  If you become more Yohann Curl of breath, please return to the hospital.     Increase activity slowly    Complete by:  As directed             Medication List         acetaminophen 500 MG tablet  Commonly known as:  TYLENOL  Take 1,000 mg by mouth 2 (two) times daily as needed for mild pain or fever.     allopurinol 100 MG tablet  Commonly known as:  ZYLOPRIM  Take 100 mg by mouth at bedtime.     amLODipine 10 MG tablet  Commonly known as:  NORVASC  Take 10 mg by mouth daily.     aspirin EC 81 MG tablet  Take 81 mg by mouth daily.     capsaicin 0.025 % cream  Commonly known as:  ZOSTRIX  Apply topically 2 (two) times daily.     carvedilol 3.125 MG tablet  Commonly known as:  COREG  Take 3.125 mg by mouth 2 (two) times daily with a meal.     citalopram 10 MG tablet   Commonly known as:  CELEXA  Take 10 mg by mouth daily.     ezetimibe 10 MG tablet  Commonly known as:  ZETIA  Take 10 mg by mouth daily.     famotidine 20 MG tablet  Commonly known as:  PEPCID  Take 20 mg by mouth daily.     furosemide 40 MG tablet  Commonly known as:  LASIX  Take 1 tablet (40 mg total) by mouth 2 (two) times daily.     hydrALAZINE 100 MG tablet  Commonly known as:  APRESOLINE  Take 100 mg by mouth 3 (three) times daily.     iron polysaccharides 150 MG capsule  Commonly known as:  NIFEREX  Take 150 mg by mouth 2 (two) times daily.     isosorbide mononitrate 120 MG 24 hr tablet  Commonly known as:  IMDUR  Take 1 tablet (120 mg total) by mouth daily.     loratadine 10 MG tablet  Commonly known as:  CLARITIN  Take 10 mg by mouth daily.     montelukast 10 MG tablet  Commonly known as:  SINGULAIR  Take 10 mg by mouth at bedtime.     nystatin cream  Commonly known as:  MYCOSTATIN  Apply 1 application topically 2 (two) times daily.     polyethylene glycol packet  Commonly known as:  MIRALAX / GLYCOLAX  Take 17 g by mouth daily as needed for mild constipation.     timolol 0.5 % ophthalmic solution  Commonly known as:  BETIMOL  Place 1 drop into both eyes 2 (two) times daily.     VOL-CARE RX PO  Take 1 tablet by  mouth daily.       Follow-up Information   Follow up with Simona Huh, MD. Schedule an appointment as soon as possible for a visit in 1 week.   Specialty:  Family Medicine   Contact information:   [address], [place] [address] [phone]       Follow up with [facility]. Schedule an appointment as soon as possible for a visit in 1 month.   Contact information:   [place] [address] [phone]       The results of significant diagnostics from this hospitalization (including imaging, microbiology, ancillary and laboratory) are listed below for reference.    Significant  Diagnostic Studies: Dg Chest 2 View  04/16/2014   CLINICAL DATA:  Rashia Mckesson of breath.  EXAM: CHEST  2 VIEW  COMPARISON:  03/11/2014  FINDINGS: Hazy bilateral lung opacities with irregular interstitial opacities, consistent with pulmonary edema. There is more confluent lung base opacity likely additional atelectasis associated with small effusions.  Cardiac silhouette is mildly enlarged. Changes from CABG surgery are stable.  No pneumothorax.  Bony thorax is diffusely demineralized. There are stable arthropathic changes of the right shoulder.  IMPRESSION: Congestive heart failure with pulmonary edema and small effusions.   Electronically Signed   By: Lajean Manes M.D.   On: 04/16/2014 00:26   Dg Shoulder Right  04/16/2014   CLINICAL DATA:  Pain with recent trauma  EXAM: RIGHT SHOULDER - 2+ VIEW  COMPARISON:  None.  FINDINGS: Frontal and Y scapular images were obtained. There is no apparent acute fracture or dislocation. There is marked narrowing of the glenohumeral joint with subchondral sclerosis on both sides of the glenohumeral joint. There is mild osteoarthritic change in the acromioclavicular joint. No erosive change or bony destruction.  IMPRESSION: Severe osteoarthritic change in the glenohumeral joint. Mild osteoarthritic change in the acromioclavicular joint. No fracture or dislocation appreciable.   Electronically Signed   By: Lowella Grip M.D.   On: 04/16/2014 11:14   Dg Chest Port 1 View  04/18/2014   CLINICAL DATA:  Followup of heart failure after diuresis. coronary artery disease. COPD.  EXAM: PORTABLE CHEST - 1 VIEW  COMPARISON:  04/16/2014  FINDINGS: Prior median sternotomy. Advanced right greater than left glenohumeral joint osteoarthritis. Mild cardiomegaly with a tortuous thoracic aorta. No pleural effusion or pneumothorax. Improved mild interstitial prominence and indistinctness. Mild bibasilar volume loss and atelectasis.  IMPRESSION: Cardiomegaly with improved, mild congestive heart  failure.   Electronically Signed   By: Abigail Miyamoto M.D.   On: 04/18/2014 10:09    Microbiology: No results found for this or any previous visit (from the past 240 hour(s)).   Labs: Basic Metabolic Panel:  Recent Labs Lab 04/16/14 0745 04/17/14 0532 04/18/14 0500 04/19/14 0405 04/20/14 0338  NA 144 139 142 139 139  K 4.6 3.9 3.9 4.0 4.1  CL 108 102 103 102 101  CO2 19 22 24 23 24   GLUCOSE 131* 85 96 109* 103*  BUN 74* 69* 80* 84* 76*  CREATININE 1.98* 1.93* 2.03* 2.19* 2.02*  CALCIUM 8.9 9.0 8.5 8.6 9.0   Liver Function Tests:  Recent Labs Lab 04/15/14 2355  AST 38*  ALT 24  ALKPHOS 119*  BILITOT 0.3  PROT 7.3  ALBUMIN 3.6   No results found for this basename: LIPASE, AMYLASE,  in the last 168 hours No results found for this basename: AMMONIA,  in the last 168 hours CBC:  Recent Labs  Lab 04/15/14 2355  WBC 8.7  NEUTROABS 6.9  HGB 10.1*  HCT 31.6*  MCV 72.1*  PLT 207   Cardiac Enzymes: No results found for this basename: CKTOTAL, CKMB, CKMBINDEX, TROPONINI,  in the last 168 hours BNP: BNP (last 3 results)  Recent Labs  03/09/14 2013 04/15/14 2359  PROBNP 4610.0* 3805.0*   CBG:  Recent Labs Lab 04/19/14 2026 04/20/14 0027 04/20/14 0413 04/20/14 0734 04/20/14 1159  GLUCAP 132* 123* 108* 134* 165*    Time coordinating discharge: 45 minutes  Signed:  Janece Canterbury  [facility] 04/20/2014, 3:53 PM

## 2014-04-20 DIAGNOSIS — I509 Heart failure, unspecified: Secondary | ICD-10-CM

## 2014-04-20 DIAGNOSIS — R5381 Other malaise: Secondary | ICD-10-CM

## 2014-04-20 LAB — BASIC METABOLIC PANEL
BUN: 76 mg/dL — AB (ref 6–23)
CALCIUM: 9 mg/dL (ref 8.4–10.5)
CO2: 24 meq/L (ref 19–32)
Chloride: 101 mEq/L (ref 96–112)
Creatinine, Ser: 2.02 mg/dL — ABNORMAL HIGH (ref 0.50–1.10)
GFR calc Af Amer: 25 mL/min — ABNORMAL LOW (ref >90)
GFR calc non Af Amer: 21 mL/min — ABNORMAL LOW (ref >90)
GLUCOSE: 103 mg/dL — AB (ref 70–99)
Potassium: 4.1 mEq/L (ref 3.7–5.3)
SODIUM: 139 meq/L (ref 137–147)

## 2014-04-20 LAB — GLUCOSE, CAPILLARY
GLUCOSE-CAPILLARY: 134 mg/dL — AB (ref 70–99)
GLUCOSE-CAPILLARY: 156 mg/dL — AB (ref 70–99)
Glucose-Capillary: 123 mg/dL — ABNORMAL HIGH (ref 70–99)
Glucose-Capillary: 108 mg/dL — ABNORMAL HIGH (ref 70–99)
Glucose-Capillary: 165 mg/dL — ABNORMAL HIGH (ref 70–99)
Glucose-Capillary: 164 mg/dL — ABNORMAL HIGH (ref 70–99)

## 2014-04-20 MED ORDER — HYDROCERIN EX CREA
TOPICAL_CREAM | Freq: Two times a day (BID) | CUTANEOUS | Status: DC
  Administered 2014-04-20 – 2014-04-21 (×2): via TOPICAL
  Filled 2014-04-20: qty 113

## 2014-04-20 NOTE — Progress Notes (Signed)
Physical Therapy Treatment Patient Details Name: [name] MRN: [id number] DOB: Jul 13, 1927 Today's Date: 04/20/2014    History of Present Illness [name] is a 80 y.o. female with a history of Diastolic CHF, CAD, HTN, CKD Stage III, DM2, Hyperlipidemia, and PVD who presents to the ED with complaints of worsening SOB over the past 2 days. She was found to have hypoxemia of 88% in the ED and placed on 3 liters NCO2.  Chest x-ray revealed pulmonary edema and small pleural effusions.   PT with h/o Rt. AKA.      PT Comments    Pt progressing with mobility able to perform squat pivots x 3 this session with mod assist and perform own pericare. Pt reports when she initially has trouble with her heart she tends to give up but feels better after moving today. Pt required setup for all transfers and assist with maneuvering WC and brakes end of session but reports home chair is power. Will continue to follow and encouraged pt to be OOb for all meals. Pt also educated for pressure relief as sitting in WC end of session without pressure relieving cushion and RN notified.   Follow Up Recommendations  Home health PT;Supervision for mobility/OOB (pt states she prefers HHPT over CIR)     Equipment Recommendations       Recommendations for Other Services       Precautions / Restrictions Precautions Precautions: Fall Precaution Comments: R AKA, doesn't stand or walk Restrictions Weight Bearing Restrictions: No    Mobility  Bed Mobility Overal bed mobility: Needs Assistance Bed Mobility: Sit to Supine       Sit to supine: Min assist   General bed mobility comments: pt able to transfer supine to sit with HOB 10degrees and minor use of rail  Transfers Overall transfer level: Needs assistance Equipment used: None Transfers: Set designer Transfers;Lateral/Scoot Transfers     Squat pivot transfers: Mod assist    Lateral/Scoot Transfers: Mod assist General transfer comment: Cueing to  weight shift sufficiently forward to move mass off bed, left/right  Ambulation/Gait                 Stairs            Wheelchair Mobility    Modified Rankin (Stroke Patients Only)       Balance Overall balance assessment: Needs assistance Sitting-balance support: No upper extremity supported Sitting balance-Leahy Scale: Good   Postural control: Posterior lean (Able to self correct)   Standing balance-Leahy Scale: Zero                      Cognition Arousal/Alertness: Awake/alert Behavior During Therapy: WFL for tasks assessed/performed Overall Cognitive Status: Within Functional Limits for tasks assessed                      Exercises      General Comments        Pertinent Vitals/Pain No pain sats 88-91% on RA with activity HR 65 sats 92% on 2L end of session    Home Living                      Prior Function            PT Goals (current goals can now be found in the care plan section) Acute Rehab PT Goals Patient Stated Goal: To stay as independent as possible.  Progress towards PT goals: Progressing  toward goals    Frequency       PT Plan Discharge plan needs to be updated    Co-evaluation             End of Session Equipment Utilized During Treatment: Gait belt Activity Tolerance: Patient tolerated treatment well Patient left: in chair;with call bell/phone within reach     Time: 1249-1315 PT Time Calculation (min): 26 min  Charges:  $Therapeutic Activity: 23-37 mins                    G Codes:      Maili Shutters B Gustavo Dispenza 05/09/2014, 1:19 PM Elwyn Reach, [place]

## 2014-04-20 NOTE — Progress Notes (Signed)
04-20-14 1009  Medicare IM signed by patient's granddaughter and signed copy left on chart. Ocie Cornfield [place], RN,BSN [phone]

## 2014-04-20 NOTE — Progress Notes (Signed)
Occupational Therapy Treatment Patient Details Name: [name] MRN: [id number] DOB: 04-14-1927 Today's Date: 04/20/2014    History of present illness CLEDA IMEL is a 80 y.o. female with a history of Diastolic CHF, CAD, HTN, CKD Stage III, DM2, Hyperlipidemia, and PVD who presents to the ED with complaints of worsening SOB over the past 2 days. She was found to have hypoxemia of 88% in the ED and placed on 3 liters NCO2.  Chest x-ray revealed pulmonary edema and small pleural effusions.   PT with h/o Rt. AKA.     OT comments  Patient seen this am for OT intervention to address level surface transfers via squat pivot and lateral scoot method.  Patient short of breath with activity, but quickly recovered with 3-5 second rest.  Patient needing cues to weight shift forward to scoot or squat as needed for toilet transfer.  Patient with improved carryover after 2-3 repetitions of same weight shift.  Progressed from mod to min assist with practice.  Needs increased emphasis on lateral scoot, to aide in bed mobility, toilet transfers / hygiene.    Follow Up Recommendations  Home health OT;Supervision/Assistance - 24 hour    Equipment Recommendations  Hospital bed    Recommendations for Other Services      Precautions / Restrictions Precautions Precautions: Fall Precaution Comments: R AKA, doesn't stand or walk Restrictions Weight Bearing Restrictions: No       Mobility Bed Mobility Overal bed mobility: Needs Assistance Bed Mobility: Sit to Supine       Sit to supine: Min assist   General bed mobility comments: pt able to transfer supine to sit with HOB 10degrees and minor use of rail  Transfers Overall transfer level: Needs assistance Equipment used: None Transfers: Set designer Transfers;Lateral/Scoot Transfers     Squat pivot transfers: Mod assist    Lateral/Scoot Transfers: Mod assist General transfer comment: Cueing to weight shift sufficiently forward to move mass  off bed, left/right    Balance Overall balance assessment: Needs assistance Sitting-balance support: No upper extremity supported Sitting balance-Leahy Scale: Good   Postural control: Posterior lean (Able to self correct)   Standing balance-Leahy Scale: Zero                     ADL Overall ADL's : Needs assistance/impaired                         Toilet Transfer: Moderate assistance;Squat-pivot                    Vision                     Perception     Praxis      Cognition   Behavior During Therapy: WFL for tasks assessed/performed Overall Cognitive Status: Within Functional Limits for tasks assessed                       Extremity/Trunk Assessment               Exercises     Shoulder Instructions       General Comments      Pertinent Vitals/ Pain       No pain / VSS  Home Living  Prior Functioning/Environment              Frequency Min 2X/week     Progress Toward Goals  OT Goals(current goals can now be found in the care plan section)  Progress towards OT goals: Progressing toward goals  Acute Rehab OT Goals Patient Stated Goal: To stay as independent as possible.  OT Goal Formulation: With patient Potential to Achieve Goals: Good  Plan Discharge plan remains appropriate;Frequency remains appropriate    Co-evaluation                 End of Session     Activity Tolerance Patient tolerated treatment well   Patient Left in bed;with call bell/phone within reach   Nurse Communication          Time: 1130-1153 OT Time Calculation (min): 23 min  Charges: OT General Charges $OT Visit: 1 Procedure OT Treatments $Self Care/Home Management : 23-37 mins  Mariah Milling 04/20/2014, 1:15 PM

## 2014-04-20 NOTE — Consult Note (Signed)
Physical Medicine and Rehabilitation Consult Reason for Consult: Deconditioning/CHF exacerbation Referring Physician: [facility]   HPI: [name] is a 80 y.o. right-handed female with history of diastolic congestive heart failure, CAD, hypertension and chronic renal insufficiency with creatinine baseline 2.02 and history of right AKA September 2012 and received inpatient rehabilitation services and prosthesis provided by [employer] [facility]. Patient primarily wheelchair bound since January and limited use of prosthesis and assistance of her granddaughter and hired caregivers. Presented 04/16/2014 with complaints of worsening shortness of breath for 2 days as well as nonproductive cough. Noted hypoxemia of 88% in the ED was placed on 3 L oxygen. Chest x-ray showed pulmonary edema small bilateral pleural effusions. She was administered intravenous Lasix. Noted hyperkalemia 5.7 received Kayexalate. Complaints of right shoulder pain with x-rays negative for fracture but did show severe osteoarthritic changes in the glenohumeral joint. Subcutaneous Lovenox for DVT prophylaxis. Latest chest x-ray improved mild congestive heart failure. Physical and occupational therapy evaluations completed 04/17/2014 with recommendations for physical medicine rehabilitation consult   Review of Systems  Respiratory: Positive for cough and shortness of breath.   Gastrointestinal:       GERD  Musculoskeletal: Positive for myalgias.  Psychiatric/Behavioral: Positive for depression.  All other systems reviewed and are negative.  Past Medical History  Diagnosis Date  . DM (diabetes mellitus) type II controlled peripheral vascular disorder     A1C = 5.8  . HTN (hypertension)   . CHF (congestive heart failure)     EF = 29-92%, grade 2 diastolic dysfunction  . CAD (coronary artery disease)   . COPD (chronic obstructive pulmonary disease)   . Cataracts, bilateral   . Hyperlipidemia   . Peripheral vascular  disease bilateral    S/P femoral-popliteal bypass surgery  . Glaucoma   . TIA (transient ischemic attack)   . Shortness of breath   . Pneumonia     HX OF PNA  . Depression   . GERD (gastroesophageal reflux disease)   . Arthritis     RA  . Anemia   . CKD (chronic kidney disease)    Past Surgical History  Procedure Laterality Date  . Coronary artery bypass graft    . Spine surgery    . Eye surgery  cataract  . Carpal tunnel release  left  . Breast surgery  lumpectomy right  . Aortogram w/ runoff  06/06/11    right leg  . Above knee leg amputation  07/27/11    Right AKA   Family History  Problem Relation Age of Onset  . Hypertension Mother   . Peripheral vascular disease Mother   . Diabetes Father   . Kidney disease Sister   . Cancer Brother     lung  . Lung disease Sister   . Lung disease Sister   . Kidney disease Daughter     On dialysis, died in February 25, 2006   Social History:  reports that she quit smoking about 37 years ago. Her smoking use included Cigarettes. She has a 40 pack-year smoking history. She has never used smokeless tobacco. She reports that she does not drink alcohol or use illicit drugs. Allergies:  Allergies  Allergen Reactions  . Codeine Other (See Comments)    REACTION: unknown  . Omnipaque [Iohexol] Other (See Comments)    REACTION: unknown  . Celebrex [Celecoxib] Rash  . Iodine Rash   Medications Prior to Admission  Medication Sig Dispense Refill  . acetaminophen (TYLENOL) 500 MG tablet Take  1,000 mg by mouth 2 (two) times daily as needed for mild pain or fever.      Marland Kitchen allopurinol (ZYLOPRIM) 100 MG tablet Take 100 mg by mouth at bedtime.       Marland Kitchen amLODipine (NORVASC) 10 MG tablet Take 10 mg by mouth daily.      Marland Kitchen aspirin EC 81 MG tablet Take 81 mg by mouth daily.      . B Complex-C-Folic Acid (VOL-CARE RX PO) Take 1 tablet by mouth daily.      . carvedilol (COREG) 3.125 MG tablet Take 3.125 mg by mouth 2 (two) times daily with a meal.      .  citalopram (CELEXA) 10 MG tablet Take 10 mg by mouth daily.       Marland Kitchen ezetimibe (ZETIA) 10 MG tablet Take 10 mg by mouth daily.      . famotidine (PEPCID) 20 MG tablet Take 20 mg by mouth daily.      . hydrALAZINE (APRESOLINE) 100 MG tablet Take 100 mg by mouth 3 (three) times daily.      . iron polysaccharides (NIFEREX) 150 MG capsule Take 150 mg by mouth 2 (two) times daily.      . isosorbide mononitrate (IMDUR) 60 MG 24 hr tablet Take 1 tablet (60 mg total) by mouth daily.  30 tablet  0  . loratadine (CLARITIN) 10 MG tablet Take 10 mg by mouth daily.      . montelukast (SINGULAIR) 10 MG tablet Take 10 mg by mouth at bedtime.      Marland Kitchen nystatin cream (MYCOSTATIN) Apply 1 application topically 2 (two) times daily.      . polyethylene glycol (MIRALAX / GLYCOLAX) packet Take 17 g by mouth daily as needed for mild constipation.      . timolol (BETIMOL) 0.5 % ophthalmic solution Place 1 drop into both eyes 2 (two) times daily.      . [DISCONTINUED] furosemide (LASIX) 20 MG tablet Take 20 mg by mouth daily.        Home: Home Living Family/patient expects to be discharged to:: Private residence Living Arrangements: Other (Comment) (granddaughter who is RN on [name]) Available Help at Discharge: Family;Personal care attendant;Available PRN/intermittently Type of Home: House Home Access: Ramped entrance Home Layout: One level Home Equipment: Shower seat;Bedside commode;Wheelchair - power;Wheelchair - manual (Drop arm BSC) Additional Comments: Pt had PC 9-3 M-F; 9-2 Sat; 9-1 on Sun.   Functional History: Prior Function Level of Independence: Needs assistance Gait / Transfers Assistance Needed: Pt in non ambulatory.  Only pivots to w/c and BSC.  Has not been able to walk for 3 years.  She reports she scoots to electric WC and scoots to drop arm BSC ADL's / Homemaking Assistance Needed: Pt able to bathe UB, mod A with LB; dons sock with min - mod A (dependeng on day).  Requires assist with grooming  and UB dressing due to limitations with shoulders.  Pt with h/o falls at home Functional Status:  Mobility: Bed Mobility General bed mobility comments: Pt sitting EOB upon arrival  Transfers Overall transfer level: Needs assistance Equipment used: None Transfers: Sit to/from Stand Sit to Stand: Max assist Squat pivot transfers: Max assist General transfer comment: Attempted x 3 from low recliner chair.  Unable with gait belt or with bed pad in chair.  Pt pushes posteriorly when attempting to stand from low recliner.  Ambulation/Gait General Gait Details: unable, non ambulatory x 3 years Wheelchair Mobility Wheelchair mobility:  (uses a power  chair at home)  ADL: ADL Overall ADL's : Needs assistance/impaired Eating/Feeding: Independent;Sitting Grooming: Wash/dry hands;Set up;Sitting Upper Body Bathing: Moderate assistance;Sitting Lower Body Bathing: Maximal assistance;Sit to/from stand Upper Body Dressing : Moderate assistance;Sitting Lower Body Dressing: Maximal assistance;Sit to/from stand Lower Body Dressing Details (indicate cue type and reason): Pt was able to don sock with min A in long sitting  Toilet Transfer: Maximal assistance;Squat-pivot;BSC Toileting- Clothing Manipulation and Hygiene: Total assistance;Sit to/from stand Functional mobility during ADLs: Maximal assistance General ADL Comments: Spoke with pt re: wearing shoe at home to protect Lt. foot during transfers.  she reports she is not able to pivot as easily with shoe on, but is willing to try when she gets home.  [place] daughter present during eval and is aware of pt current status.  Discussed options of performing sliding board or scoot transfers at hom  Cognition: Cognition Overall Cognitive Status: Within Functional Limits for tasks assessed Cognition Arousal/Alertness: Awake/alert Behavior During Therapy: WFL for tasks assessed/performed Overall Cognitive Status: Within Functional Limits for tasks  assessed Memory:  (possibly mild STM deficits, had difficulty with situation)  Blood pressure 147/52, pulse 66, temperature 98.7 F (37.1 C), temperature source Oral, resp. rate 14, height 5\' 2"  (1.575 m), weight 82.555 kg (182 lb), SpO2 100.00%. Physical Exam  Constitutional: She is oriented to person, place, and time.  HENT:  Head: Normocephalic.  Eyes: EOM are normal.  Neck: Normal range of motion. Neck supple. No thyromegaly present.  Cardiovascular: Normal rate and regular rhythm.   Respiratory:  Decreased breath sounds clear to auscultation  GI: Soft. Bowel sounds are normal. She exhibits no distension.  Neurological: She is alert and oriented to person, place, and time.  Follows commands. UE's 4+/ RLE 3+HF, LLE 3+ HF, 4KE and ADF/APF. Decreased LT below the left knee and in the hands to a lesser extent.  Skin:  Right AKA well healed  Psychiatric: She has a normal mood and affect. Her behavior is normal. Thought content normal.    Results for orders placed during the hospital encounter of 04/15/14 (from the past 24 hour(s))  GLUCOSE, CAPILLARY     Status: Abnormal   Collection Time    04/19/14  7:25 AM      Result Value Ref Range   Glucose-Capillary 118 (*) 70 - 99 mg/dL  GLUCOSE, CAPILLARY     Status: Abnormal   Collection Time    04/19/14 11:46 AM      Result Value Ref Range   Glucose-Capillary 209 (*) 70 - 99 mg/dL  OCCULT BLOOD X 1 CARD TO LAB, STOOL     Status: None   Collection Time    04/19/14  1:58 PM      Result Value Ref Range   Fecal Occult Bld NEGATIVE  NEGATIVE  GLUCOSE, CAPILLARY     Status: Abnormal   Collection Time    04/19/14  4:41 PM      Result Value Ref Range   Glucose-Capillary 152 (*) 70 - 99 mg/dL  GLUCOSE, CAPILLARY     Status: Abnormal   Collection Time    04/19/14  8:26 PM      Result Value Ref Range   Glucose-Capillary 132 (*) 70 - 99 mg/dL  GLUCOSE, CAPILLARY     Status: Abnormal   Collection Time    04/20/14 12:27 AM      Result  Value Ref Range   Glucose-Capillary 123 (*) 70 - 99 mg/dL  BASIC METABOLIC PANEL  Status: Abnormal   Collection Time    04/20/14  3:38 AM      Result Value Ref Range   Sodium 139  137 - 147 mEq/L   Potassium 4.1  3.7 - 5.3 mEq/L   Chloride 101  96 - 112 mEq/L   CO2 24  19 - 32 mEq/L   Glucose, Bld 103 (*) 70 - 99 mg/dL   BUN 76 (*) 6 - 23 mg/dL   Creatinine, Ser 2.02 (*) 0.50 - 1.10 mg/dL   Calcium 9.0  8.4 - 10.5 mg/dL   GFR calc non Af Amer 21 (*) >90 mL/min   GFR calc Af Amer 25 (*) >90 mL/min  GLUCOSE, CAPILLARY     Status: Abnormal   Collection Time    04/20/14  4:13 AM      Result Value Ref Range   Glucose-Capillary 108 (*) 70 - 99 mg/dL   Dg Chest Port 1 View  04/18/2014   CLINICAL DATA:  Followup of heart failure after diuresis. coronary artery disease. COPD.  EXAM: PORTABLE CHEST - 1 VIEW  COMPARISON:  04/16/2014  FINDINGS: Prior median sternotomy. Advanced right greater than left glenohumeral joint osteoarthritis. Mild cardiomegaly with a tortuous thoracic aorta. No pleural effusion or pneumothorax. Improved mild interstitial prominence and indistinctness. Mild bibasilar volume loss and atelectasis.  IMPRESSION: Cardiomegaly with improved, mild congestive heart failure.   Electronically Signed   By: Abigail Miyamoto M.D.   On: 04/18/2014 10:09    Assessment/Plan: Diagnosis: deconditioning after multiple medical issues. Previous right AKA 1. Does the need for close, 24 hr/day medical supervision in concert with the patient's rehab needs make it unreasonable for this patient to be served in a less intensive setting? Yes 2. Co-Morbidities requiring supervision/potential complications: dm, copd, chf, htn 3. Due to bladder management, bowel management, safety, skin/wound care, disease management, medication administration, pain management and patient education, does the patient require 24 hr/day rehab nursing? Yes 4. Does the patient require coordinated care of a physician, rehab  nurse, PT (1-2 hrs/day, 5 days/week) and OT (1-2 hrs/day, 5 days/week) to address physical and functional deficits in the context of the above medical diagnosis(es)? Yes Addressing deficits in the following areas: balance, endurance, locomotion, strength, transferring, bowel/bladder control, bathing, dressing, feeding, grooming, toileting and psychosocial support 5. Can the patient actively participate in an intensive therapy program of at least 3 hrs of therapy per day at least 5 days per week? Yes 6. The potential for patient to make measurable gains while on inpatient rehab is excellent 7. Anticipated functional outcomes upon discharge from inpatient rehab are modified independent and supervision  with PT, modified independent and min assist with OT, n/a with SLP. 8. Estimated rehab length of stay to reach the above functional goals is: 12-14 days 9. Does the patient have adequate social supports to accommodate these discharge functional goals? Yes 10. Anticipated D/C setting: Home 11. Anticipated post D/C treatments: [place] therapy 12. Overall Rehab/Functional Prognosis: good  RECOMMENDATIONS: This patient's condition is appropriate for continued rehabilitative care in the following setting: CIR Patient has agreed to participate in recommended program. Yes Note that insurance prior authorization may be required for reimbursement for recommended care.  Comment: Rehab Admissions Coordinator to follow up.  Thanks,  Meredith Staggers, MD, Mellody Drown     04/20/2014

## 2014-04-21 ENCOUNTER — Encounter (HOSPITAL_COMMUNITY): Payer: Self-pay | Admitting: *Deleted

## 2014-04-21 ENCOUNTER — Inpatient Hospital Stay (HOSPITAL_COMMUNITY)
Admission: RE | Admit: 2014-04-21 | Discharge: 2014-05-01 | DRG: 945 | Disposition: A | Discharge status: Home Health | Payer: Medicare Other | Source: Intra-hospital | Attending: Physical Medicine & Rehabilitation | Admitting: Physical Medicine & Rehabilitation

## 2014-04-21 DIAGNOSIS — I5032 Chronic diastolic (congestive) heart failure: Secondary | ICD-10-CM

## 2014-04-21 DIAGNOSIS — I129 Hypertensive chronic kidney disease with stage 1 through stage 4 chronic kidney disease, or unspecified chronic kidney disease: Secondary | ICD-10-CM

## 2014-04-21 DIAGNOSIS — E0781 Sick-euthyroid syndrome: Secondary | ICD-10-CM

## 2014-04-21 DIAGNOSIS — E785 Hyperlipidemia, unspecified: Secondary | ICD-10-CM

## 2014-04-21 DIAGNOSIS — Z8673 Personal history of transient ischemic attack (TIA), and cerebral infarction without residual deficits: Secondary | ICD-10-CM

## 2014-04-21 DIAGNOSIS — I5031 Acute diastolic (congestive) heart failure: Secondary | ICD-10-CM

## 2014-04-21 DIAGNOSIS — F3289 Other specified depressive episodes: Secondary | ICD-10-CM

## 2014-04-21 DIAGNOSIS — Z79899 Other long term (current) drug therapy: Secondary | ICD-10-CM

## 2014-04-21 DIAGNOSIS — E875 Hyperkalemia: Secondary | ICD-10-CM

## 2014-04-21 DIAGNOSIS — Z89619 Acquired absence of unspecified leg above knee: Secondary | ICD-10-CM

## 2014-04-21 DIAGNOSIS — I798 Other disorders of arteries, arterioles and capillaries in diseases classified elsewhere: Secondary | ICD-10-CM

## 2014-04-21 DIAGNOSIS — Z5189 Encounter for other specified aftercare: Principal | ICD-10-CM

## 2014-04-21 DIAGNOSIS — N189 Chronic kidney disease, unspecified: Secondary | ICD-10-CM

## 2014-04-21 DIAGNOSIS — I2510 Atherosclerotic heart disease of native coronary artery without angina pectoris: Secondary | ICD-10-CM

## 2014-04-21 DIAGNOSIS — Z6833 Body mass index (BMI) 33.0-33.9, adult: Secondary | ICD-10-CM

## 2014-04-21 DIAGNOSIS — N184 Chronic kidney disease, stage 4 (severe): Secondary | ICD-10-CM

## 2014-04-21 DIAGNOSIS — H409 Unspecified glaucoma: Secondary | ICD-10-CM

## 2014-04-21 DIAGNOSIS — D649 Anemia, unspecified: Secondary | ICD-10-CM

## 2014-04-21 DIAGNOSIS — E1159 Type 2 diabetes mellitus with other circulatory complications: Secondary | ICD-10-CM

## 2014-04-21 DIAGNOSIS — E1149 Type 2 diabetes mellitus with other diabetic neurological complication: Secondary | ICD-10-CM

## 2014-04-21 DIAGNOSIS — Z7982 Long term (current) use of aspirin: Secondary | ICD-10-CM

## 2014-04-21 DIAGNOSIS — R5381 Other malaise: Secondary | ICD-10-CM

## 2014-04-21 DIAGNOSIS — I509 Heart failure, unspecified: Secondary | ICD-10-CM

## 2014-04-21 DIAGNOSIS — J4489 Other specified chronic obstructive pulmonary disease: Secondary | ICD-10-CM

## 2014-04-21 DIAGNOSIS — K219 Gastro-esophageal reflux disease without esophagitis: Secondary | ICD-10-CM

## 2014-04-21 DIAGNOSIS — S78119A Complete traumatic amputation at level between unspecified hip and knee, initial encounter: Secondary | ICD-10-CM

## 2014-04-21 DIAGNOSIS — E119 Type 2 diabetes mellitus without complications: Secondary | ICD-10-CM

## 2014-04-21 DIAGNOSIS — Z993 Dependence on wheelchair: Secondary | ICD-10-CM

## 2014-04-21 DIAGNOSIS — F329 Major depressive disorder, single episode, unspecified: Secondary | ICD-10-CM

## 2014-04-21 DIAGNOSIS — Z951 Presence of aortocoronary bypass graft: Secondary | ICD-10-CM

## 2014-04-21 DIAGNOSIS — J449 Chronic obstructive pulmonary disease, unspecified: Secondary | ICD-10-CM

## 2014-04-21 DIAGNOSIS — M109 Gout, unspecified: Secondary | ICD-10-CM

## 2014-04-21 DIAGNOSIS — M19019 Primary osteoarthritis, unspecified shoulder: Secondary | ICD-10-CM

## 2014-04-21 DIAGNOSIS — Z87891 Personal history of nicotine dependence: Secondary | ICD-10-CM

## 2014-04-21 DIAGNOSIS — I10 Essential (primary) hypertension: Secondary | ICD-10-CM

## 2014-04-21 DIAGNOSIS — E1142 Type 2 diabetes mellitus with diabetic polyneuropathy: Secondary | ICD-10-CM

## 2014-04-21 LAB — BASIC METABOLIC PANEL
BUN: 74 mg/dL — ABNORMAL HIGH (ref 6–23)
CO2: 25 meq/L (ref 19–32)
Calcium: 8.9 mg/dL (ref 8.4–10.5)
Chloride: 98 mEq/L (ref 96–112)
Creatinine, Ser: 2.04 mg/dL — ABNORMAL HIGH (ref 0.50–1.10)
GFR calc Af Amer: 24 mL/min — ABNORMAL LOW (ref >90)
GFR calc non Af Amer: 21 mL/min — ABNORMAL LOW (ref >90)
Glucose, Bld: 157 mg/dL — ABNORMAL HIGH (ref 70–99)
Potassium: 4.3 mEq/L (ref 3.7–5.3)
SODIUM: 139 meq/L (ref 137–147)

## 2014-04-21 LAB — CBC
HEMATOCRIT: 28.4 % — AB (ref 36.0–46.0)
Hemoglobin: 9.1 g/dL — ABNORMAL LOW (ref 12.0–15.0)
MCH: 22.9 pg — ABNORMAL LOW (ref 26.0–34.0)
MCHC: 32 g/dL (ref 30.0–36.0)
MCV: 71.4 fL — ABNORMAL LOW (ref 78.0–100.0)
Platelets: 230 10*3/uL (ref 150–400)
RBC: 3.98 MIL/uL (ref 3.87–5.11)
RDW: 17.7 % — ABNORMAL HIGH (ref 11.5–15.5)
WBC: 8.1 10*3/uL (ref 4.0–10.5)

## 2014-04-21 LAB — CREATININE, SERUM
Creatinine, Ser: 2.05 mg/dL — ABNORMAL HIGH (ref 0.50–1.10)
GFR calc Af Amer: 24 mL/min — ABNORMAL LOW (ref >90)
GFR, EST NON AFRICAN AMERICAN: 21 mL/min — AB (ref >90)

## 2014-04-21 LAB — GLUCOSE, CAPILLARY
GLUCOSE-CAPILLARY: 197 mg/dL — AB (ref 70–99)
GLUCOSE-CAPILLARY: 157 mg/dL — AB (ref 70–99)
GLUCOSE-CAPILLARY: 122 mg/dL — AB (ref 70–99)
Glucose-Capillary: 136 mg/dL — ABNORMAL HIGH (ref 70–99)
Glucose-Capillary: 137 mg/dL — ABNORMAL HIGH (ref 70–99)
Glucose-Capillary: 166 mg/dL — ABNORMAL HIGH (ref 70–99)
Glucose-Capillary: 75 mg/dL (ref 70–99)

## 2014-04-21 MED ORDER — ENOXAPARIN SODIUM 30 MG/0.3ML ~~LOC~~ SOLN
30.0000 mg | SUBCUTANEOUS | Status: DC
  Administered 2014-04-22 – 2014-05-01 (×10): 30 mg via SUBCUTANEOUS
  Filled 2014-04-21 (×10): qty 0.3

## 2014-04-21 MED ORDER — ONDANSETRON HCL 4 MG PO TABS
4.0000 mg | ORAL_TABLET | Freq: Four times a day (QID) | ORAL | Status: DC | PRN
  Administered 2014-04-24 – 2014-04-28 (×2): 4 mg via ORAL
  Filled 2014-04-21 (×2): qty 1

## 2014-04-21 MED ORDER — FAMOTIDINE 20 MG PO TABS
20.0000 mg | ORAL_TABLET | Freq: Every day | ORAL | Status: DC
  Administered 2014-04-22 – 2014-05-01 (×10): 20 mg via ORAL
  Filled 2014-04-21 (×12): qty 1

## 2014-04-21 MED ORDER — ONDANSETRON HCL 4 MG/2ML IJ SOLN: 4.0000 mg | Freq: Four times a day (QID) | INTRAMUSCULAR | Status: DC | PRN

## 2014-04-21 MED ORDER — FUROSEMIDE 20 MG PO TABS
20.0000 mg | ORAL_TABLET | Freq: Once | ORAL | Status: AC
  Administered 2014-04-21: 20 mg via ORAL
  Filled 2014-04-21: qty 1

## 2014-04-21 MED ORDER — INSULIN ASPART 100 UNIT/ML ~~LOC~~ SOLN
0.0000 [IU] | SUBCUTANEOUS | Status: DC
  Administered 2014-04-21: 2 [IU] via SUBCUTANEOUS
  Administered 2014-04-21 – 2014-04-22 (×3): 1 [IU] via SUBCUTANEOUS
  Administered 2014-04-22: 2 [IU] via SUBCUTANEOUS
  Administered 2014-04-23 (×3): 1 [IU] via SUBCUTANEOUS

## 2014-04-21 MED ORDER — TIMOLOL MALEATE 0.5 % OP SOLN
1.0000 [drp] | Freq: Two times a day (BID) | OPHTHALMIC | Status: DC
  Administered 2014-04-21 – 2014-05-01 (×18): 1 [drp] via OPHTHALMIC
  Filled 2014-04-21 (×2): qty 5

## 2014-04-21 MED ORDER — DOCUSATE SODIUM 100 MG PO CAPS
100.0000 mg | ORAL_CAPSULE | Freq: Two times a day (BID) | ORAL | Status: DC
  Administered 2014-04-21 – 2014-04-29 (×15): 100 mg via ORAL
  Filled 2014-04-21 (×18): qty 1

## 2014-04-21 MED ORDER — AMLODIPINE BESYLATE 10 MG PO TABS
10.0000 mg | ORAL_TABLET | Freq: Every day | ORAL | Status: DC
  Administered 2014-04-22 – 2014-05-01 (×10): 10 mg via ORAL
  Filled 2014-04-21 (×12): qty 1

## 2014-04-21 MED ORDER — LORATADINE 10 MG PO TABS
10.0000 mg | ORAL_TABLET | Freq: Every day | ORAL | Status: DC
  Administered 2014-04-22 – 2014-05-01 (×10): 10 mg via ORAL
  Filled 2014-04-21 (×12): qty 1

## 2014-04-21 MED ORDER — ENOXAPARIN SODIUM 30 MG/0.3ML ~~LOC~~ SOLN: 30.0000 mg | SUBCUTANEOUS | Status: DC

## 2014-04-21 MED ORDER — MONTELUKAST SODIUM 10 MG PO TABS
10.0000 mg | ORAL_TABLET | Freq: Every day | ORAL | Status: DC
  Administered 2014-04-21 – 2014-04-30 (×10): 10 mg via ORAL
  Filled 2014-04-21 (×11): qty 1

## 2014-04-21 MED ORDER — CITALOPRAM HYDROBROMIDE 10 MG PO TABS
10.0000 mg | ORAL_TABLET | Freq: Every day | ORAL | Status: DC
  Administered 2014-04-22 – 2014-05-01 (×10): 10 mg via ORAL
  Filled 2014-04-21 (×12): qty 1

## 2014-04-21 MED ORDER — SORBITOL 70 % SOLN: 30.0000 mL | Freq: Every day | Status: DC | PRN

## 2014-04-21 MED ORDER — CAPSAICIN 0.025 % EX CREA
TOPICAL_CREAM | Freq: Two times a day (BID) | CUTANEOUS | Status: DC
  Administered 2014-04-21 – 2014-04-26 (×11): via TOPICAL
  Administered 2014-04-27: 1 g via TOPICAL
  Administered 2014-04-27 – 2014-05-01 (×8): via TOPICAL
  Filled 2014-04-21: qty 56.6

## 2014-04-21 MED ORDER — ACETAMINOPHEN 325 MG PO TABS
650.0000 mg | ORAL_TABLET | Freq: Four times a day (QID) | ORAL | Status: DC | PRN
  Administered 2014-04-22 – 2014-04-29 (×12): 650 mg via ORAL
  Filled 2014-04-21 (×11): qty 2

## 2014-04-21 MED ORDER — WHITE PETROLATUM GEL: Status: DC | PRN

## 2014-04-21 MED ORDER — EZETIMIBE 10 MG PO TABS
10.0000 mg | ORAL_TABLET | Freq: Every day | ORAL | Status: DC
  Administered 2014-04-22 – 2014-05-01 (×10): 10 mg via ORAL
  Filled 2014-04-21 (×12): qty 1

## 2014-04-21 MED ORDER — SALINE SPRAY 0.65 % NA SOLN
1.0000 | NASAL | Status: DC | PRN
  Filled 2014-04-21: qty 44

## 2014-04-21 MED ORDER — POLYSACCHARIDE IRON COMPLEX 150 MG PO CAPS
150.0000 mg | ORAL_CAPSULE | Freq: Two times a day (BID) | ORAL | Status: DC
  Administered 2014-04-21 – 2014-05-01 (×20): 150 mg via ORAL
  Filled 2014-04-21 (×24): qty 1

## 2014-04-21 MED ORDER — ALLOPURINOL 100 MG PO TABS
100.0000 mg | ORAL_TABLET | Freq: Every day | ORAL | Status: DC
  Administered 2014-04-22 – 2014-05-01 (×10): 100 mg via ORAL
  Filled 2014-04-21 (×12): qty 1

## 2014-04-21 MED ORDER — CARVEDILOL 3.125 MG PO TABS
3.1250 mg | ORAL_TABLET | Freq: Two times a day (BID) | ORAL | Status: DC
  Administered 2014-04-21 – 2014-05-01 (×20): 3.125 mg via ORAL
  Filled 2014-04-21 (×23): qty 1

## 2014-04-21 MED ORDER — HYDRALAZINE HCL 50 MG PO TABS
100.0000 mg | ORAL_TABLET | Freq: Three times a day (TID) | ORAL | Status: DC
  Administered 2014-04-21 – 2014-05-01 (×29): 100 mg via ORAL
  Filled 2014-04-21 (×32): qty 2

## 2014-04-21 MED ORDER — ISOSORBIDE MONONITRATE ER 60 MG PO TB24
120.0000 mg | ORAL_TABLET | Freq: Every day | ORAL | Status: DC
  Administered 2014-04-22 – 2014-05-01 (×10): 120 mg via ORAL
  Filled 2014-04-21 (×12): qty 2

## 2014-04-21 MED ORDER — FUROSEMIDE 40 MG PO TABS
60.0000 mg | ORAL_TABLET | Freq: Two times a day (BID) | ORAL | Status: DC
  Filled 2014-04-21 (×2): qty 1

## 2014-04-21 MED ORDER — HYDROCERIN EX CREA
TOPICAL_CREAM | Freq: Two times a day (BID) | CUTANEOUS | Status: DC
  Administered 2014-04-21 – 2014-05-01 (×19): via TOPICAL
  Filled 2014-04-21: qty 113

## 2014-04-21 MED ORDER — FUROSEMIDE 40 MG PO TABS
60.0000 mg | ORAL_TABLET | Freq: Two times a day (BID) | ORAL | Status: DC
  Administered 2014-04-21 – 2014-05-01 (×20): 60 mg via ORAL
  Filled 2014-04-21 (×23): qty 1

## 2014-04-21 MED ORDER — SENNA 8.6 MG PO TABS
2.0000 | ORAL_TABLET | Freq: Every day | ORAL | Status: DC
  Administered 2014-04-23 – 2014-04-30 (×8): 17.2 mg via ORAL
  Filled 2014-04-21 (×12): qty 2

## 2014-04-21 MED ORDER — FUROSEMIDE 40 MG PO TABS: 60.0000 mg | ORAL_TABLET | Freq: Two times a day (BID) | ORAL | Status: DC

## 2014-04-21 NOTE — H&P (Signed)
Physical Medicine and Rehabilitation Admission H&P  Chief Complaint   Patient presents with   .  Shortness of Breath   :  HPI: [name] is a 80 y.o. right-handed female with history of diabetes mellitus with peripheral neuropathy, diastolic congestive heart failure, CAD, hypertension and chronic renal insufficiency with creatinine baseline 2.02 and history of right AKA September 2012 and received inpatient rehabilitation services and prosthesis provided by [employer] [facility]. Patient primarily wheelchair bound since January and limited use of prosthesis and assistance of her granddaughter and hired caregivers. Presented 04/16/2014 with complaints of worsening shortness of breath for 2 days as well as nonproductive cough. Noted hypoxemia of 88% in the ED was placed on 3 L oxygen. Chest x-ray showed pulmonary edema small bilateral pleural effusions. She was administered intravenous Lasix. Noted hyperkalemia 5.7 received Kayexalate. Complaints of right shoulder pain with x-rays negative for fracture but did show severe osteoarthritic changes in the glenohumeral joint. Subcutaneous Lovenox for DVT prophylaxis. Latest chest x-ray improved mild congestive heart failure. Physical and occupational therapy evaluations completed 04/17/2014 with recommendations for physical medicine rehabilitation consult. Admitted for comprehensive rehabilitation program    ROS Review of Systems  Respiratory: Positive for cough and shortness of breath.  Gastrointestinal:  GERD  Musculoskeletal: Positive for myalgias.  Psychiatric/Behavioral: Positive for depression.  All other systems reviewed and are negative  Past Medical History   Diagnosis  Date   .  DM (diabetes mellitus) type II controlled peripheral vascular disorder      A1C = 5.8   .  HTN (hypertension)    .  CHF (congestive heart failure)      EF = 81-19%, grade 2 diastolic dysfunction   .  CAD (coronary artery disease)    .  COPD (chronic obstructive  pulmonary disease)    .  Cataracts, bilateral    .  Hyperlipidemia    .  Peripheral vascular disease  bilateral     S/P femoral-popliteal bypass surgery   .  Glaucoma    .  TIA (transient ischemic attack)    .  Shortness of breath    .  Pneumonia      HX OF PNA   .  Depression    .  GERD (gastroesophageal reflux disease)    .  Arthritis      RA   .  Anemia    .  CKD (chronic kidney disease)     Past Surgical History   Procedure  Laterality  Date   .  Coronary artery bypass graft     .  Spine surgery     .  Eye surgery   cataract   .  Carpal tunnel release   left   .  Breast surgery   lumpectomy right   .  Aortogram w/ runoff   06/06/11     right leg   .  Above knee leg amputation   07/27/11     Right AKA    Family History   Problem  Relation  Age of Onset   .  Hypertension  Mother    .  Peripheral vascular disease  Mother    .  Diabetes  Father    .  Kidney disease  Sister    .  Cancer  Brother      lung   .  Lung disease  Sister    .  Lung disease  Sister    .  Kidney disease  Daughter  On dialysis, died in 2007    Social History: reports that she quit smoking about 37 years ago. Her smoking use included Cigarettes. She has a 40 pack-year smoking history. She has never used smokeless tobacco. She reports that she does not drink alcohol or use illicit drugs.  Allergies:  Allergies   Allergen  Reactions   .  Codeine  Other (See Comments)     REACTION: unknown   .  Omnipaque [Iohexol]  Other (See Comments)     REACTION: unknown   .  Celebrex [Celecoxib]  Rash   .  Iodine  Rash    Medications Prior to Admission   Medication  Sig  Dispense  Refill   .  acetaminophen (TYLENOL) 500 MG tablet  Take 1,000 mg by mouth 2 (two) times daily as needed for mild pain or fever.     Marland Kitchen  allopurinol (ZYLOPRIM) 100 MG tablet  Take 100 mg by mouth at bedtime.     Marland Kitchen  amLODipine (NORVASC) 10 MG tablet  Take 10 mg by mouth daily.     Marland Kitchen  aspirin EC 81 MG tablet  Take 81 mg by  mouth daily.     .  B Complex-C-Folic Acid (VOL-CARE RX PO)  Take 1 tablet by mouth daily.     .  carvedilol (COREG) 3.125 MG tablet  Take 3.125 mg by mouth 2 (two) times daily with a meal.     .  citalopram (CELEXA) 10 MG tablet  Take 10 mg by mouth daily.     Marland Kitchen  ezetimibe (ZETIA) 10 MG tablet  Take 10 mg by mouth daily.     .  famotidine (PEPCID) 20 MG tablet  Take 20 mg by mouth daily.     .  hydrALAZINE (APRESOLINE) 100 MG tablet  Take 100 mg by mouth 3 (three) times daily.     .  iron polysaccharides (NIFEREX) 150 MG capsule  Take 150 mg by mouth 2 (two) times daily.     .  isosorbide mononitrate (IMDUR) 60 MG 24 hr tablet  Take 1 tablet (60 mg total) by mouth daily.  30 tablet  0   .  loratadine (CLARITIN) 10 MG tablet  Take 10 mg by mouth daily.     .  montelukast (SINGULAIR) 10 MG tablet  Take 10 mg by mouth at bedtime.     Marland Kitchen  nystatin cream (MYCOSTATIN)  Apply 1 application topically 2 (two) times daily.     .  polyethylene glycol (MIRALAX / GLYCOLAX) packet  Take 17 g by mouth daily as needed for mild constipation.     .  timolol (BETIMOL) 0.5 % ophthalmic solution  Place 1 drop into both eyes 2 (two) times daily.     .  [DISCONTINUED] furosemide (LASIX) 20 MG tablet  Take 20 mg by mouth daily.      Home:  Home Living  Family/patient expects to be discharged to:: Private residence  Living Arrangements: Other (Comment) (granddaughter who is RN on [name])  Available Help at Discharge: Family;Personal care attendant;Available PRN/intermittently  Type of Home: House  Home Access: Ramped entrance  Home Layout: One level  Home Equipment: Shower seat;Bedside commode;Wheelchair - power;Wheelchair - manual (Drop arm BSC)  Additional Comments: Pt had PC 9-3 M-F; 9-2 Sat; 9-1 on Sun.  Functional History:  Prior Function  Level of Independence: Needs assistance  Gait / Transfers Assistance Needed: Pt in non ambulatory. Only pivots to w/c and BSC. Has not been  able to walk for 3 years.  She reports she scoots to electric WC and scoots to drop arm BSC  ADL's / Homemaking Assistance Needed: Pt able to bathe UB, mod A with LB; dons sock with min - mod A (dependeng on day). Requires assist with grooming and UB dressing due to limitations with shoulders. Pt with h/o falls at home  Functional Status:  Mobility:  Bed Mobility  Overal bed mobility: Needs Assistance  Bed Mobility: Sit to Supine  Sit to supine: Min assist  General bed mobility comments: pt able to transfer supine to sit with HOB 10degrees and minor use of rail  Transfers  Overall transfer level: Needs assistance  Equipment used: None  Transfers: Set designer Transfers;Lateral/Scoot Transfers  Sit to Stand: Max assist  Squat pivot transfers: Mod assist  Lateral/Scoot Transfers: Mod assist  General transfer comment: Cueing to weight shift sufficiently forward to move mass off bed, left/right  Ambulation/Gait  General Gait Details: unable, non ambulatory x 3 years  Wheelchair Mobility  Wheelchair mobility: (uses a power chair at home)  ADL:  ADL  Overall ADL's : Needs assistance/impaired  Eating/Feeding: Independent;Sitting  Grooming: Wash/dry hands;Set up;Sitting  Upper Body Bathing: Moderate assistance;Sitting  Lower Body Bathing: Maximal assistance;Sit to/from stand  Upper Body Dressing : Moderate assistance;Sitting  Lower Body Dressing: Maximal assistance;Sit to/from stand  Lower Body Dressing Details (indicate cue type and reason): Pt was able to don sock with min A in long sitting  Toilet Transfer: Moderate assistance;Squat-pivot  Toileting- Clothing Manipulation and Hygiene: Total assistance;Sit to/from stand  Functional mobility during ADLs: Maximal assistance  General ADL Comments: Spoke with pt re: wearing shoe at home to protect Lt. foot during transfers. she reports she is not able to pivot as easily with shoe on, but is willing to try when she gets home. [place] daughter present during eval and is  aware of pt current status. Discussed options of performing sliding board or scoot transfers at hom  Cognition:  Cognition  Overall Cognitive Status: Within Functional Limits for tasks assessed  Cognition  Arousal/Alertness: Awake/alert  Behavior During Therapy: WFL for tasks assessed/performed  Overall Cognitive Status: Within Functional Limits for tasks assessed  Memory: (possibly mild STM deficits, had difficulty with situation)    Physical Exam:  Blood pressure 148/50, pulse 59, temperature 98.4 F (36.9 C), temperature source Oral, resp. rate 18, height 5' 2"  (1.575 m), weight 83.643 kg (184 lb 6.4 oz), SpO2 98.00%.  Constitutional: She is oriented to person, place, and time.  HENT: oral mucosa pink and moist Head: Normocephalic.  Eyes: EOM are normal.  Neck: Normal range of motion. Neck supple. No thyromegaly present.  Cardiovascular: Normal rate and regular rhythm. No murmurs  Respiratory:  Decreased breath sounds as a whole but otherwise clear to auscultation  GI: Soft. Bowel sounds are normal. She exhibits no distension.  Neurological: She is alert and oriented to person, place, and time. CN exam normal Follows commands. UE's 4+ deltoid, bicep, tricep, HI. RLE 3+HF, LLE 3+ HF, 4KE and ADF/APF. Decreased LT/PP below the left knee and in the hands to a lesser extent.  Skin:  Right AKA well healed and shaped. Psychiatric: She has a normal mood and affect. Her behavior is normal. Thought content normal    Results for orders placed during the hospital encounter of 04/15/14 (from the past 48 hour(s))   GLUCOSE, CAPILLARY Status: Abnormal    Collection Time    04/19/14 11:46 AM   Result  Value  Ref Range    Glucose-Capillary  209 (*)  70 - 99 mg/dL   OCCULT BLOOD X 1 CARD TO LAB, STOOL Status: None    Collection Time    04/19/14 1:58 PM   Result  Value  Ref Range    Fecal Occult Bld  NEGATIVE  NEGATIVE   GLUCOSE, CAPILLARY Status: Abnormal    Collection Time    04/19/14  4:41 PM   Result  Value  Ref Range    Glucose-Capillary  152 (*)  70 - 99 mg/dL   GLUCOSE, CAPILLARY Status: Abnormal    Collection Time    04/19/14 8:26 PM   Result  Value  Ref Range    Glucose-Capillary  132 (*)  70 - 99 mg/dL   GLUCOSE, CAPILLARY Status: Abnormal    Collection Time    04/20/14 12:27 AM   Result  Value  Ref Range    Glucose-Capillary  123 (*)  70 - 99 mg/dL   BASIC METABOLIC PANEL Status: Abnormal    Collection Time    04/20/14 3:38 AM   Result  Value  Ref Range    Sodium  139  137 - 147 mEq/L    Potassium  4.1  3.7 - 5.3 mEq/L    Chloride  101  96 - 112 mEq/L    CO2  24  19 - 32 mEq/L    Glucose, Bld  103 (*)  70 - 99 mg/dL    BUN  76 (*)  6 - 23 mg/dL    Creatinine, Ser  2.02 (*)  0.50 - 1.10 mg/dL    Calcium  9.0  8.4 - 10.5 mg/dL    GFR calc non Af Amer  21 (*)  >90 mL/min    GFR calc Af Amer  25 (*)  >90 mL/min    Comment:  (NOTE)     The eGFR has been calculated using the CKD EPI equation.     This calculation has not been validated in all clinical situations.     eGFR's persistently <90 mL/min signify possible Chronic Kidney     Disease.   GLUCOSE, CAPILLARY Status: Abnormal    Collection Time    04/20/14 4:13 AM   Result  Value  Ref Range    Glucose-Capillary  108 (*)  70 - 99 mg/dL   GLUCOSE, CAPILLARY Status: Abnormal    Collection Time    04/20/14 7:34 AM   Result  Value  Ref Range    Glucose-Capillary  134 (*)  70 - 99 mg/dL    Comment 1  Documented in Chart     Comment 2  Notify RN    GLUCOSE, CAPILLARY Status: Abnormal    Collection Time    04/20/14 11:59 AM   Result  Value  Ref Range    Glucose-Capillary  165 (*)  70 - 99 mg/dL    Comment 1  Documented in Chart     Comment 2  Notify RN    GLUCOSE, CAPILLARY Status: Abnormal    Collection Time    04/20/14 4:45 PM   Result  Value  Ref Range    Glucose-Capillary  164 (*)  70 - 99 mg/dL    Comment 1  Documented in Chart     Comment 2  Notify RN    GLUCOSE, CAPILLARY Status:  Abnormal    Collection Time    04/20/14 8:31 PM   Result  Value  Ref Range    Glucose-Capillary  156 (*)  70 - 99 mg/dL   GLUCOSE, CAPILLARY Status: None    Collection Time    04/21/14 12:57 AM   Result  Value  Ref Range    Glucose-Capillary  75  70 - 99 mg/dL   GLUCOSE, CAPILLARY Status: Abnormal    Collection Time    04/21/14 1:44 AM   Result  Value  Ref Range    Glucose-Capillary  137 (*)  70 - 99 mg/dL   BASIC METABOLIC PANEL Status: Abnormal    Collection Time    04/21/14 4:27 AM   Result  Value  Ref Range    Sodium  139  137 - 147 mEq/L    Potassium  4.3  3.7 - 5.3 mEq/L    Chloride  98  96 - 112 mEq/L    CO2  25  19 - 32 mEq/L    Glucose, Bld  157 (*)  70 - 99 mg/dL    BUN  74 (*)  6 - 23 mg/dL    Creatinine, Ser  2.04 (*)  0.50 - 1.10 mg/dL    Calcium  8.9  8.4 - 10.5 mg/dL    GFR calc non Af Amer  21 (*)  >90 mL/min    GFR calc Af Amer  24 (*)  >90 mL/min    Comment:  (NOTE)     The eGFR has been calculated using the CKD EPI equation.     This calculation has not been validated in all clinical situations.     eGFR's persistently <90 mL/min signify possible Chronic Kidney     Disease.   GLUCOSE, CAPILLARY Status: Abnormal    Collection Time    04/21/14 4:44 AM   Result  Value  Ref Range    Glucose-Capillary  166 (*)  70 - 99 mg/dL   GLUCOSE, CAPILLARY Status: Abnormal    Collection Time    04/21/14 7:51 AM   Result  Value  Ref Range    Glucose-Capillary  136 (*)  70 - 99 mg/dL      Medical Problem List and Plan:  1. Functional deficits secondary to Multi-medical/Deconditioning/history right AKA September 2012  2. DVT Prophylaxis/Anticoagulation: Subcutaneous Lovenox. Monitor platelet counts any signs of bleeding  3. Pain Management: Tylenol as needed  4. Mood/depression: Celexa 10 mg daily. Provide emotional support  5. Neuropsych: This patient is capable of making decisions on her own behalf.  6. Diastolic congestive heart failure. Lasix 60 mg twice a day.  Monitor for any signs of fluid overload  7. Hypertension. Norvasc 10 mg daily, Coreg 3.125 mg twice a day, hydralazine 100 mg 3 times a day, Imdur 120 mg daily. Monitor with increased mobility  8. Diabetes mellitus with peripheral neuropathy. Hemoglobin A1c 6.4. Continue sliding scale insulin. Check blood sugars a.c. and at bedtime. Patient diet controlled prior to admission.  9. Chronic renal insufficiency. Baseline creatinine 2.02. Followup chemistries  10. Chronic anemia. Continue Niferex. Followup CBC  11. Hyperlipidemia. Zetia  12. History of gout. Zyloprim. Monitor for any gout flare ups  13. GERD. Pepcid  14. COPD. Singulair 10 mg daily    Post Admission Physician Evaluation:  1. Functional deficits secondary to deconditioning related to multiple medical issues. Has hx of right AKA. 2. Patient is admitted to receive collaborative, interdisciplinary care between the physiatrist, rehab nursing staff, and therapy team. 3. Patient's level of medical complexity and substantial therapy needs in context of that medical necessity cannot be provided at a lesser  intensity of care such as a SNF. 4. Patient has experienced substantial functional loss from his/her baseline which was documented above under the "Functional History" and "Functional Status" headings. Judging by the patient's diagnosis, physical exam, and functional history, the patient has potential for functional progress which will result in measurable gains while on inpatient rehab. These gains will be of substantial and practical use upon discharge in facilitating mobility and self-care at the household level. 5. Physiatrist will provide 24 hour management of medical needs as well as oversight of the therapy plan/treatment and provide guidance as appropriate regarding the interaction of the two. 6. 24 hour rehab nursing will assist with bladder management, bowel management, safety, skin/wound care, disease management, medication  administration and patient education and help integrate therapy concepts, techniques,education, etc. 7. PT will assess and treat for/with: Lower extremity strength, range of motion, stamina, balance, functional mobility, safety, adaptive techniques and equipment, cardiopulmonary stamina, caregiver education. Goals are: supervision to min assist at the wheelchair level. 8. OT will assess and treat for/with: ADL's, functional mobility, safety, upper extremity strength, adaptive techniques and equipment, cardiopulmonary stamina, caregiver education. Goals are: supervision to min assist at a wheelchair level. 9. SLP will assess and treat for/with: n/a. Goals are: n/a. 10. Case Management and Social Worker will assess and treat for psychological issues and discharge planning. 11. Team conference will be held weekly to assess progress toward goals and to determine barriers to discharge. 12. Patient will receive at least 3 hours of therapy per day at least 5 days per week. 13. ELOS: 10-14 days  14. Prognosis: good.   Meredith Staggers, MD, [facility]   04/21/2014

## 2014-04-21 NOTE — PMR Pre-admission (Signed)
PMR Admission Coordinator Pre-Admission Assessment  Patient: [name] is an 80 y.o., female MRN: [id number] DOB: 06-26-1927 Height: 5\' 2"  (157.5 cm) Weight: 83.643 kg (184 lb 6.4 oz)              Insurance Information HMO:      PPO:       PCP:       IPA:       80/20:       OTHER:   PRIMARY: Medicare A/B      Policy#: [id number]      Subscriber: Sherrye Payor  CM Name:        Phone#:       Fax#:   Pre-Cert#:        Employer: Retired Benefits:  Phone #:       Name: Checked in [place]. Date: 02/11/94     Deduct: $1260      Out of Pocket Max: none      Life Max: unlimited CIR: 100%      SNF: 100 days Outpatient: 80%     Co-Pay: 20% Home Health: 100%      Co-Pay: none DME: 80%     Co-Pay: 20% Providers: patient's choice  SECONDARY: Medicaid [place] access      Policy#: [id number]      Subscriber: Sherrye Payor CM Name:        Phone#:       Fax#:   Pre-Cert#:        Employer: Retired Benefits:  Phone #: [phone]     Name:   Eff. Date: 04/21/14 eligible      Deduct:        Out of Pocket Max:        Life Max:   CIR:        SNF:   Outpatient:       Co-Pay:   Home Health:        Co-Pay:  DME:       Co-Pay:    Emergency Contact Information Contact Information   Name Relation Home Work [place] Grandaughter [phone]  [phone]   Marsh,Cynthia Daughter [phone]     Crickett, Abbett [phone]       Current Medical History  Patient Admitting Diagnosis: deconditioning after multiple medical issues. Previous right AKA   History of Present Illness:  An 80 y.o. right-handed female with history of diastolic congestive heart failure, CAD, hypertension and chronic renal insufficiency with creatinine baseline 2.02 and history of right AKA September 2012 and received inpatient rehabilitation services and prosthesis provided by [employer] prost[facility]. Patient primarily wheelchair bound since January and limited use of prosthesis and assistance of her  granddaughter and hired caregivers. Presented 04/16/2014 with complaints of worsening shortness of breath for 2 days as well as nonproductive cough. Noted hypoxemia of 88% in the ED was placed on 3 L oxygen. Chest x-ray showed pulmonary edema small bilateral pleural effusions. She was administered intravenous Lasix. Noted hyperkalemia 5.7 received Kayexalate. Complaints of right shoulder pain with x-rays negative for fracture but did show severe osteoarthritic changes in the glenohumeral joint. Subcutaneous Lovenox for DVT prophylaxis. Latest chest x-ray improved mild congestive heart failure. Physical and occupational therapy evaluations completed 04/17/2014 with recommendations for physical medicine rehabilitation consult.    Past Medical History  Past Medical History  Diagnosis Date  . DM (diabetes mellitus) type II controlled peripheral vascular disorder     A1C = 5.8  . HTN (hypertension)   .  CHF (congestive heart failure)     EF = 06-23%, grade 2 diastolic dysfunction  . CAD (coronary artery disease)   . COPD (chronic obstructive pulmonary disease)   . Cataracts, bilateral   . Hyperlipidemia   . Peripheral vascular disease bilateral    S/P femoral-popliteal bypass surgery  . Glaucoma   . TIA (transient ischemic attack)   . Shortness of breath   . Pneumonia     HX OF PNA  . Depression   . GERD (gastroesophageal reflux disease)   . Arthritis     RA  . Anemia   . CKD (chronic kidney disease)     Family History  family history includes Cancer in her brother; Diabetes in her father; Hypertension in her mother; Kidney disease in her daughter and sister; Lung disease in her sister and sister; Peripheral vascular disease in her mother.  Prior Rehab/Hospitalizations:  Came to CIR 2 yrs ago.  Recently had [place] [facility] 2 weeks ago through [facility].   Current Medications  Current facility-administered medications:0.9 %  sodium chloride infusion, 250 mL, Intravenous, PRN, Theressa Millard,  MD;  acetaminophen (TYLENOL) tablet 650 mg, 650 mg, Oral, Q6H PRN, Janece Canterbury, MD, 650 mg at 04/18/14 1423;  allopurinol (ZYLOPRIM) tablet 100 mg, 100 mg, Oral, Daily, Janece Canterbury, MD, 100 mg at 04/21/14 7628;  amLODipine (NORVASC) tablet 10 mg, 10 mg, Oral, Daily, Janece Canterbury, MD, 10 mg at 04/21/14 3151 capsaicin (ZOSTRIX) 0.025 % cream, , Topical, BID, Janece Canterbury, MD;  carvedilol (COREG) tablet 3.125 mg, 3.125 mg, Oral, BID WC, Janece Canterbury, MD, 3.125 mg at 04/21/14 0801;  citalopram (CELEXA) tablet 10 mg, 10 mg, Oral, Daily, Janece Canterbury, MD, 10 mg at 04/21/14 7616;  docusate sodium (COLACE) capsule 100 mg, 100 mg, Oral, BID, Janece Canterbury, MD, 100 mg at 04/20/14 1037 enoxaparin (LOVENOX) injection 30 mg, 30 mg, Subcutaneous, Q24H, Theressa Millard, MD, 30 mg at 04/21/14 0737;  ezetimibe (ZETIA) tablet 10 mg, 10 mg, Oral, Daily, Janece Canterbury, MD, 10 mg at 04/21/14 1062;  famotidine (PEPCID) tablet 20 mg, 20 mg, Oral, Daily, Janece Canterbury, MD, 20 mg at 04/21/14 6948;  furosemide (LASIX) tablet 60 mg, 60 mg, Oral, BID, Janece Canterbury, MD hydrALAZINE (APRESOLINE) tablet 100 mg, 100 mg, Oral, TID, Janece Canterbury, MD, 100 mg at 04/21/14 5462;  hydrocerin (EUCERIN) cream, , Topical, BID, Janece Canterbury, MD;  insulin aspart (novoLOG) injection 0-9 Units, 0-9 Units, Subcutaneous, 6 times per day, Theressa Millard, MD, 1 Units at 04/21/14 0801;  iron polysaccharides (NIFEREX) capsule 150 mg, 150 mg, Oral, BID, Janece Canterbury, MD, 150 mg at 04/21/14 7035 isosorbide mononitrate (IMDUR) 24 hr tablet 120 mg, 120 mg, Oral, Daily, Janece Canterbury, MD, 120 mg at 04/21/14 0093;  loratadine (CLARITIN) tablet 10 mg, 10 mg, Oral, Daily, Janece Canterbury, MD, 10 mg at 04/21/14 0952;  montelukast (SINGULAIR) tablet 10 mg, 10 mg, Oral, QHS, Janece Canterbury, MD, 10 mg at 04/20/14 2255;  polyethylene glycol (MIRALAX / GLYCOLAX) packet 17 g, 17 g, Oral, BID PRN, Janece Canterbury, MD, 17 g at  04/19/14 0955 senna (SENOKOT) tablet 17.2 mg, 2 tablet, Oral, QHS, Janece Canterbury, MD, 17.2 mg at 04/19/14 2235;  sodium chloride (OCEAN) 0.65 % nasal spray 1 spray, 1 spray, Each Nare, PRN, Janece Canterbury, MD, 1 spray at 04/20/14 1044;  sodium chloride 0.9 % injection 3 mL, 3 mL, Intravenous, Q12H, Harvette C Jenkins, MD, 3 mL at 04/21/14 1000;  sodium chloride 0.9 % injection 3 mL, 3 mL,  Intravenous, PRN, Theressa Millard, MD timolol (TIMOPTIC) 0.5 % ophthalmic solution 1 drop, 1 drop, Both Eyes, BID, Janece Canterbury, MD, 1 drop at 04/21/14 [id number];  white petrolatum (VASELINE) gel, , Topical, PRN, Janece Canterbury, MD  Patients Current Diet: Heart healthy/Carb mod, 2GM Na, thin liquids  Precautions / Restrictions Precautions Precautions: Fall Precaution Comments: R AKA, doesn't stand or walk Restrictions Weight Bearing Restrictions: No   Prior Activity Level Limited Community (1-2x/wk): Went out 1 X a week to MD for labs.  Uses a motorized wheel chair.  Home Assistive Devices / Equipment Home Assistive Devices/Equipment: Wheelchair;Bedside commode/3-in-1;CBG Meter;Grab bars in shower;Shower chair with back Home Equipment: Shower seat;Bedside commode;Wheelchair - power;Wheelchair - manual (Drop arm BSC)  Prior Functional Level Prior Function Level of Independence: Needs assistance Gait / Transfers Assistance Needed: Pt in non ambulatory.  Only pivots to w/c and BSC.  Has not been able to walk for 3 years.  She reports she scoots to electric WC and scoots to drop arm BSC ADL's / Homemaking Assistance Needed: Pt able to bathe UB, mod A with LB; dons sock with min - mod A (dependeng on day).  Requires assist with grooming and UB dressing due to limitations with shoulders.  Pt with h/o falls at home  Current Functional Level Cognition  Overall Cognitive Status: Within Functional Limits for tasks assessed    Extremity Assessment (includes Sensation/Coordination)  Upper Extremity  Assessment: Defer to OT evaluation  RUE Deficits / Details: Pt with h/o RA. Shoulders limite to ~60* elevation. Rt hand with swan neck deformities and limited flexion  LUE Deficits / Details: Pt with h/o RA. Shoulder limitation limited to 50-60* elevation  Lower Extremity Assessment: RLE deficits/detail;LLE deficits/detail  RLE Deficits / Details: R AKA  LLE Deficits / Details: Left leg weak at least 3/5 strength ankle, knee, and hip, but she is unable to support her weight over that leg without quite a bit of assist.    ADLs  Overall ADL's : Needs assistance/impaired Eating/Feeding: Independent;Sitting Grooming: Wash/dry hands;Set up;Sitting Upper Body Bathing: Moderate assistance;Sitting Lower Body Bathing: Maximal assistance;Sit to/from stand Upper Body Dressing : Moderate assistance;Sitting Lower Body Dressing: Maximal assistance;Sit to/from stand Lower Body Dressing Details (indicate cue type and reason): Pt was able to don sock with min A in long sitting  Toilet Transfer: Moderate assistance;Squat-pivot Toileting- Clothing Manipulation and Hygiene: Total assistance;Sit to/from stand Functional mobility during ADLs: Maximal assistance General ADL Comments: Spoke with pt re: wearing shoe at home to protect Lt. foot during transfers.  she reports she is not able to pivot as easily with shoe on, but is willing to try when she gets home.  [place] daughter present during eval and is aware of pt current status.  Discussed options of performing sliding board or scoot transfers at hom    Mobility  Overal bed mobility: Needs Assistance Bed Mobility: Sit to Supine Sit to supine: Min assist General bed mobility comments: pt able to transfer supine to sit with HOB 10degrees and minor use of rail    Transfers  Overall transfer level: Needs assistance Equipment used: None Transfers: Set designer Transfers;Lateral/Scoot Transfers Sit to Stand: Max assist Squat pivot transfers: Mod assist   Lateral/Scoot Transfers: Mod assist General transfer comment: Cueing to weight shift sufficiently forward to move mass off bed, left/right    Ambulation / Gait / Stairs / Wheelchair Mobility  Ambulation/Gait General Gait Details: unable, non ambulatory x 3 years Wheelchair Mobility Wheelchair mobility:  (uses a power chair  at home)    Posture / Balance Overall balance assessment: Needs assistance  Sitting-balance support: No upper extremity supported  Sitting balance-Leahy Scale: Good  Standing balance support: Bilateral upper extremity supported  Standing balance-Leahy Scale: Zero    Special needs/care consideration BiPAP/CPAP No CPM No Continuous Drip IV No Dialysis No        Life Vest No Oxygen None at home Special Bed No Trach Size No Wound Vac (area) No     Skin Has bruising/hematoma R buttock.  Feet dry with calluses.  Left big toe tender.  Has yeast under abdominal skin folds                            Bowel mgmt: Last BM 04/20/14, loose Bladder mgmt: Voiding on bedpan and up on [facility] Diabetic mgmt Yes, holding oral diabetic meds due to drop in blood sugar.    Previous Home Environment Living Arrangements: Other (Comment) (granddaughter who is RN on [name]) Available Help at Discharge: Family;Personal care attendant;Available PRN/intermittently Type of Home: House Home Layout: One level Home Access: Ramped entrance Bathroom Shower/Tub: Chiropodist: [place]: Yes Type of Home Care Services: [place] (if known): [place] Additional Comments: Pt had PC 9-3 M-F; 9-2 Sat; 9-1 on Sun.   Discharge Living Setting Plans for Discharge Living Setting: House;Lives with (comment) (Will go home with grand daughter.) Type of Home at Discharge: House Discharge Home Layout: One level Discharge Home Access: Ramped entrance Does the patient have any problems obtaining your  medications?: No  Social/Family/Support Systems Patient Roles: Parent Contact Information: Hubert Azure - grand daughter Anticipated Caregiver: [name], great Anticipated Caregiver's Contact Information: [name] (h) [phone] (c) [phone] Ability/Limitations of Caregiver: Rozanna Boer daughter, [name], works FT 12 hr shifts.  An aide comes in 7 days a week, M_F 9a to 3p, sat 9a-2p and sun 9ato1p.  Great grandson assists when granddaughter is working. Caregiver Availability: 24/7 Discharge Plan Discussed with Primary Caregiver: Yes Is Caregiver In Agreement with Plan?: Yes Does Caregiver/Family have Issues with Lodging/Transportation while Pt is in Rehab?: No  Goals/Additional Needs Patient/Family Goal for Rehab: PT mod I/Supervision, OT mod I to min Assist goals Expected length of stay: 12-14 days Cultural Considerations: None Dietary Needs: Heart Healthy/Cab mod, 2 GM Na, thin liquid diet Equipment Needs: TBD Pt/Family Agrees to Admission and willing to participate: Yes Program Orientation Provided & Reviewed with Pt/Caregiver Including Roles  & Responsibilities: Yes  Decrease burden of Care through IP rehab admission:  N/A  Possible need for SNF placement upon discharge: Not planned  Patient Condition: This patient's condition remains as documented in the consult dated 04/20/14, in which the Rehabilitation Physician determined and documented that the patient's condition is appropriate for intensive rehabilitative care in an inpatient rehabilitation facility. Will admit to inpatient rehab today.  Preadmission Screen Completed By:  Retta Diones, 04/21/2014 11:31 AM ______________________________________________________________________   Discussed status with Dr. Naaman Plummer on 04/21/14 at 1156 and received telephone approval for admission today.  Admission Coordinator:  Retta Diones, time1156/Date06/09/15

## 2014-04-21 NOTE — Progress Notes (Signed)
Rehab admissions - Evaluated for possible admission.  I spoke with patient and her grand daughter, [name].  Patient has been to inpatient rehab previously.  They are in agreement to inpatient rehab admission.  Bed available and will admit to acute inpatient rehab today.  Call me for questions.  #[phone]

## 2014-04-21 NOTE — Progress Notes (Signed)
PMR Admission Coordinator Pre-Admission Assessment  Patient: [name] is an 80 y.o., female  MRN: [id number]  DOB: 24-Nov-1926  Height: 5\' 2"  (157.5 cm)  Weight: 83.643 kg (184 lb 6.4 oz)  Insurance Information  HMO: PPO: PCP: IPA: 80/20: OTHER:  PRIMARY: Medicare A/B Policy#: [id number] d Subscriber: Sherrye Payor  CM Name: Phone#: Fax#:  Pre-Cert#: Employer: Retired  Benefits: Phone #: Name: Checked in [place]. Date: 02/11/94 Deduct: $1260 Out of Pocket Max: none Life Max: unlimited  CIR: 100% SNF: 100 days  Outpatient: 80% Co-Pay: 20%  Home Health: 100% Co-Pay: none  DME: 80% Co-Pay: 20%  Providers: patient's choice   SECONDARY: Medicaid [place] access Policy#: [id number] n Subscriber: Sherrye Payor  CM Name: Phone#: Fax#:  Pre-Cert#: Employer: Retired  Benefits: Phone #: [phone] Name:  Eff. Date: 04/21/14 eligible Deduct: Out of Pocket Max: Life Max:  CIR: SNF:  Outpatient: Co-Pay:  Home Health: Co-Pay:  DME: Co-Pay:  Emergency Contact Information  Contact Information    Name  Relation  Home  Work  [place]  Grandaughter  [phone]   [phone]    Marsh,Cynthia  Daughter  [phone]      Ainhoa, Rallo  [phone]        Current Medical History  Patient Admitting Diagnosis: deconditioning after multiple medical issues. Previous right AKA  History of Present Illness: An 80 y.o. right-handed female with history of diastolic congestive heart failure, CAD, hypertension and chronic renal insufficiency with creatinine baseline 2.02 and history of right AKA September 2012 and received inpatient rehabilitation services and prosthesis provided by [employer] [facility]. Patient primarily wheelchair bound since January and limited use of prosthesis and assistance of her granddaughter and hired caregivers. Presented 04/16/2014 with complaints of worsening shortness of breath for 2 days as well as nonproductive cough. Noted hypoxemia of 88%  in the ED was placed on 3 L oxygen. Chest x-ray showed pulmonary edema small bilateral pleural effusions. She was administered intravenous Lasix. Noted hyperkalemia 5.7 received Kayexalate. Complaints of right shoulder pain with x-rays negative for fracture but did show severe osteoarthritic changes in the glenohumeral joint. Subcutaneous Lovenox for DVT prophylaxis. Latest chest x-ray improved mild congestive heart failure. Physical and occupational therapy evaluations completed 04/17/2014 with recommendations for physical medicine rehabilitation consult.  Past Medical History  Past Medical History   Diagnosis  Date   .  DM (diabetes mellitus) type II controlled peripheral vascular disorder      A1C = 5.8   .  HTN (hypertension)    .  CHF (congestive heart failure)      EF = 76-22%, grade 2 diastolic dysfunction   .  CAD (coronary artery disease)    .  COPD (chronic obstructive pulmonary disease)    .  Cataracts, bilateral    .  Hyperlipidemia    .  Peripheral vascular disease  bilateral     S/P femoral-popliteal bypass surgery   .  Glaucoma    .  TIA (transient ischemic attack)    .  Shortness of breath    .  Pneumonia      HX OF PNA   .  Depression    .  GERD (gastroesophageal reflux disease)    .  Arthritis      RA   .  Anemia    .  CKD (chronic kidney disease)     Family History  family history includes Cancer in her brother; Diabetes in her father; Hypertension in her  mother; Kidney disease in her daughter and sister; Lung disease in her sister and sister; Peripheral vascular disease in her mother.  Prior Rehab/Hospitalizations: Came to CIR 2 yrs ago. Recently had [place] therpies 2 weeks ago through [facility].  Current Medications  Current facility-administered medications:0.9 % sodium chloride infusion, 250 mL, Intravenous, PRN, Theressa Millard, MD; acetaminophen (TYLENOL) tablet 650 mg, 650 mg, Oral, Q6H PRN, Janece Canterbury, MD, 650 mg at 04/18/14 1423; allopurinol (ZYLOPRIM) tablet  100 mg, 100 mg, Oral, Daily, Janece Canterbury, MD, 100 mg at 04/21/14 7829; amLODipine (NORVASC) tablet 10 mg, 10 mg, Oral, Daily, Janece Canterbury, MD, 10 mg at 04/21/14 5621  capsaicin (ZOSTRIX) 0.025 % cream, , Topical, BID, Janece Canterbury, MD; carvedilol (COREG) tablet 3.125 mg, 3.125 mg, Oral, BID WC, Janece Canterbury, MD, 3.125 mg at 04/21/14 0801; citalopram (CELEXA) tablet 10 mg, 10 mg, Oral, Daily, Janece Canterbury, MD, 10 mg at 04/21/14 3086; docusate sodium (COLACE) capsule 100 mg, 100 mg, Oral, BID, Janece Canterbury, MD, 100 mg at 04/20/14 1037  enoxaparin (LOVENOX) injection 30 mg, 30 mg, Subcutaneous, Q24H, Theressa Millard, MD, 30 mg at 04/21/14 5784; ezetimibe (ZETIA) tablet 10 mg, 10 mg, Oral, Daily, Janece Canterbury, MD, 10 mg at 04/21/14 6962; famotidine (PEPCID) tablet 20 mg, 20 mg, Oral, Daily, Janece Canterbury, MD, 20 mg at 04/21/14 9528; furosemide (LASIX) tablet 60 mg, 60 mg, Oral, BID, Janece Canterbury, MD  hydrALAZINE (APRESOLINE) tablet 100 mg, 100 mg, Oral, TID, Janece Canterbury, MD, 100 mg at 04/21/14 4132; hydrocerin (EUCERIN) cream, , Topical, BID, Janece Canterbury, MD; insulin aspart (novoLOG) injection 0-9 Units, 0-9 Units, Subcutaneous, 6 times per day, Theressa Millard, MD, 1 Units at 04/21/14 0801; iron polysaccharides (NIFEREX) capsule 150 mg, 150 mg, Oral, BID, Janece Canterbury, MD, 150 mg at 04/21/14 4401  isosorbide mononitrate (IMDUR) 24 hr tablet 120 mg, 120 mg, Oral, Daily, Janece Canterbury, MD, 120 mg at 04/21/14 0272; loratadine (CLARITIN) tablet 10 mg, 10 mg, Oral, Daily, Janece Canterbury, MD, 10 mg at 04/21/14 0952; montelukast (SINGULAIR) tablet 10 mg, 10 mg, Oral, QHS, Janece Canterbury, MD, 10 mg at 04/20/14 2255; polyethylene glycol (MIRALAX / GLYCOLAX) packet 17 g, 17 g, Oral, BID PRN, Janece Canterbury, MD, 17 g at 04/19/14 0955  senna (SENOKOT) tablet 17.2 mg, 2 tablet, Oral, QHS, Janece Canterbury, MD, 17.2 mg at 04/19/14 2235; sodium chloride (OCEAN) 0.65 % nasal spray  1 spray, 1 spray, Each Nare, PRN, Janece Canterbury, MD, 1 spray at 04/20/14 1044; sodium chloride 0.9 % injection 3 mL, 3 mL, Intravenous, Q12H, Theressa Millard, MD, 3 mL at 04/21/14 1000; sodium chloride 0.9 % injection 3 mL, 3 mL, Intravenous, PRN, Theressa Millard, MD  timolol (TIMOPTIC) 0.5 % ophthalmic solution 1 drop, 1 drop, Both Eyes, BID, Janece Canterbury, MD, 1 drop at 04/21/14 5366; white petrolatum (VASELINE) gel, , Topical, PRN, Janece Canterbury, MD  Patients Current Diet: Heart healthy/Carb mod, 2GM Na, thin liquids  Precautions / Restrictions  Precautions  Precautions: Fall  Precaution Comments: R AKA, doesn't stand or walk  Restrictions  Weight Bearing Restrictions: No  Prior Activity Level  Limited Community (1-2x/wk): Went out 1 X a week to [place] for labs. Uses a motorized wheel chair.  Home Assistive Devices / Equipment  Home Assistive Devices/Equipment: Wheelchair;Bedside commode/3-in-1;CBG Meter;Grab bars in shower;Shower chair with back  Home Equipment: Shower seat;Bedside commode;Wheelchair - power;Wheelchair - manual (Drop arm BSC)  Prior Functional Level  Prior Function  Level of Independence: Needs assistance  Gait / Transfers  Assistance Needed: Pt in non ambulatory. Only pivots to w/c and BSC. Has not been able to walk for 3 years. She reports she scoots to electric WC and scoots to drop arm BSC  ADL's / Homemaking Assistance Needed: Pt able to bathe UB, mod A with LB; dons sock with min - mod A (dependeng on day). Requires assist with grooming and UB dressing due to limitations with shoulders. Pt with h/o falls at home  Current Functional Level  Cognition  Overall Cognitive Status: Within Functional Limits for tasks assessed   Extremity Assessment  (includes Sensation/Coordination)  Upper Extremity Assessment: Defer to OT evaluation  RUE Deficits / Details: Pt with h/o RA. Shoulders limite to ~60* elevation. Rt hand with swan neck deformities and limited flexion   LUE Deficits / Details: Pt with h/o RA. Shoulder limitation limited to 50-60* elevation  Lower Extremity Assessment: RLE deficits/detail;LLE deficits/detail  RLE Deficits / Details: R AKA  LLE Deficits / Details: Left leg weak at least 3/5 strength ankle, knee, and hip, but she is unable to support her weight over that leg without quite a bit of assist.   ADLs  Overall ADL's : Needs assistance/impaired  Eating/Feeding: Independent;Sitting  Grooming: Wash/dry hands;Set up;Sitting  Upper Body Bathing: Moderate assistance;Sitting  Lower Body Bathing: Maximal assistance;Sit to/from stand  Upper Body Dressing : Moderate assistance;Sitting  Lower Body Dressing: Maximal assistance;Sit to/from stand  Lower Body Dressing Details (indicate cue type and reason): Pt was able to don sock with min A in long sitting  Toilet Transfer: Moderate assistance;Squat-pivot  Toileting- Clothing Manipulation and Hygiene: Total assistance;Sit to/from stand  Functional mobility during ADLs: Maximal assistance  General ADL Comments: Spoke with pt re: wearing shoe at home to protect Lt. foot during transfers. she reports she is not able to pivot as easily with shoe on, but is willing to try when she gets home. [place] daughter present during eval and is aware of pt current status. Discussed options of performing sliding board or scoot transfers at hom   Mobility  Overal bed mobility: Needs Assistance  Bed Mobility: Sit to Supine  Sit to supine: Min assist  General bed mobility comments: pt able to transfer supine to sit with HOB 10degrees and minor use of rail   Transfers  Overall transfer level: Needs assistance  Equipment used: None  Transfers: Set designer Transfers;Lateral/Scoot Transfers  Sit to Stand: Max assist  Squat pivot transfers: Mod assist  Lateral/Scoot Transfers: Mod assist  General transfer comment: Cueing to weight shift sufficiently forward to move mass off bed, left/right   Ambulation / Gait / Stairs  / Wheelchair Mobility  Ambulation/Gait  General Gait Details: unable, non ambulatory x 3 years  Wheelchair Mobility  Wheelchair mobility: (uses a power chair at home)   Posture / Balance  Overall balance assessment: Needs assistance  Sitting-balance support: No upper extremity supported  Sitting balance-Leahy Scale: Good  Standing balance support: Bilateral upper extremity supported  Standing balance-Leahy Scale: Zero   Special needs/care consideration  BiPAP/CPAP No  CPM No  Continuous Drip IV No  Dialysis No  Life Vest No  Oxygen None at home  Special Bed No  Trach Size No  Wound Vac (area) No  Skin Has bruising/hematoma R buttock. Feet dry with calluses. Left big toe tender. Has yeast under abdominal skin folds  Bowel mgmt: Last BM 04/20/14, loose  Bladder mgmt: Voiding on bedpan and up on [facility]  Diabetic mgmt Yes, holding oral diabetic meds due to drop  in blood sugar.   Previous Home Environment  Living Arrangements: Other (Comment) (granddaughter who is RN on [name])  Available Help at Discharge: Family;Personal care attendant;Available PRN/intermittently  Type of Home: House  Home Layout: One level  Home Access: Ramped entrance  Bathroom Shower/Tub: Administrator, Civil Service: Drain: Yes  Type of Home Care Services: [place] (if known): [place]  Additional Comments: Pt had PC 9-3 M-F; 9-2 Sat; 9-1 on Sun.  Discharge Living Setting  Plans for Discharge Living Setting: House;Lives with (comment) (Will go home with grand daughter.)  Type of Home at Discharge: House  Discharge Home Layout: One level  Discharge Home Access: Ramped entrance  Does the patient have any problems obtaining your medications?: No  Social/Family/Support Systems  Patient Roles: Parent  Contact Information: Hubert Azure - grand daughter  Anticipated Caregiver: [name], great  Anticipated Caregiver's Contact  Information: [name] (h) [phone] (c) [phone]  Ability/Limitations of Caregiver: Rozanna Boer daughter, [name], works FT 12 hr shifts. An aide comes in 7 days a week, M_F 9a to 3p, sat 9a-2p and sun 9ato1p. Great grandson assists when granddaughter is working.  Caregiver Availability: 24/7  Discharge Plan Discussed with Primary Caregiver: Yes  Is Caregiver In Agreement with Plan?: Yes  Does Caregiver/Family have Issues with Lodging/Transportation while Pt is in Rehab?: No  Goals/Additional Needs  Patient/Family Goal for Rehab: PT mod I/Supervision, OT mod I to min Assist goals  Expected length of stay: 12-14 days  Cultural Considerations: None  Dietary Needs: Heart Healthy/Cab mod, 2 GM Na, thin liquid diet  Equipment Needs: TBD  Pt/Family Agrees to Admission and willing to participate: Yes  Program Orientation Provided & Reviewed with Pt/Caregiver Including Roles & Responsibilities: Yes  Decrease burden of Care through IP rehab admission: N/A  Possible need for SNF placement upon discharge: Not planned  Patient Condition: This patient's condition remains as documented in the consult dated 04/20/14, in which the Rehabilitation Physician determined and documented that the patient's condition is appropriate for intensive rehabilitative care in an inpatient rehabilitation facility. Will admit to inpatient rehab today.  Preadmission Screen Completed By: Retta Diones, 04/21/2014 11:31 AM  ______________________________________________________________________  Discussed status with Dr. Naaman Plummer on 04/21/14 at 1156 and received telephone approval for admission today.  Admission Coordinator: Retta Diones, time1156/Date06/09/15  Cosigned by: Meredith Staggers, MD [04/21/2014 1:11 PM]

## 2014-04-21 NOTE — Progress Notes (Signed)
Patient arrived around 1630 to our unit. Patient has been admitted to rehab in the past. Reoriented patient to the unit and what to expect, patient verbalized understanding and explained the safety plan agreement to the patient [name] verbalized understanding and demonstrated the use of the call bell. Continue plan of care.  [name] Emiliana Blaize

## 2014-04-21 NOTE — Discharge Summary (Addendum)
Physician Discharge Summary  [name] YWV:[id number] DOB: 1927-04-14 DOA: 04/15/2014  PCP: Simona Huh, MD  Admit date: 04/15/2014 Discharge date:  04/21/2014  Recommendations for Outpatient Follow-up:  1. Followup with primary care doctor in one week for BMP.  Nephrology recommends tolerating a higher creatinine given her easily the patient developed acute on chronic heart failure.  Changed to Lasix 60 mg po twice a day which may be adjusted to keep weight near 82kg.  Continue daily weights and if she gains more than 3-lbs in one day or 5-lbs in one week, call nephrology clinic.  Repeat TFTs in 1 month. 2. Followup with nephrology within one month 3. Please set up for home oxygen at discharge if still desaturating with exertion at time of discharge.   Discharge Diagnoses:  Active Problems:   Acute diastolic CHF (congestive heart failure)   DM2 (diabetes mellitus, type 2)   CKD (chronic kidney disease), stage IV   HTN (hypertension)   COPD (chronic obstructive pulmonary disease)   CHF (congestive heart failure)   Hyperkalemia   Sick-euthyroid syndrome   Discharge Condition: Stable, improved  Diet recommendation: Diabetic/healthy heart  Wt Readings from Last 3 Encounters:  04/21/14 83.643 kg (184 lb 6.4 oz)  03/13/14 83.4 kg (183 lb 13.8 oz)  04/28/13 79.379 kg (175 lb)    History of present illness:  [name] is a 80 y.o. female with a history of Diastolic CHF, CAD, HTN, CKD Stage III, DM2, Hyperlipidemia, and PVD who presents to the ED with complaints of worsening SOB over the past 2 days. She reports having orthopnea, denies having chest pain, and she has a non-productive cough. She was found to have hypoxemia of 88% in the ED and placed on 3 liters NCO2. A chest X-ray was performed and she was found to have pulmonary edema and small bilateral pleural effusions. Her Pro-BNP was 3805.0. She was administered 40 mg IV Lasix x 1 and given Kayexalate x1 for hyperkalemia of  5.7. She was referred for medical admission.    Hospital Course:   Acute on chronic respiratory failure with hypoxia secondary to acute on chronic diastolic CHF, grade 1/2, last ECHO 03/10/14.  Her pro BNP was 3805 and she had pulmonary edema and chest x-ray. She was diureses with Lasix 80 mg IV twice a day. Because her ins and outs were not strictly recorded, it is unclear how much fluid she lost. Her weight trended down to approximately 82 kg.  Repeat chest x-ray demonstrated improvement in her pulmonary edema. Her lungs were clear and she did not have lower extremity edema at the time of discharge. She had a mild rise in her creatinine indicating that she was euvolemic.  After discussion with nephrology, recommended that she be discharged on Lasix 40 mg by mouth twice a day and tolerate a higher creatinine to prevent respiratory failure from heart failure, however, she gained some weight and developed crackles at the bases.  Increased lasix to 60mg  BID and will need close outpatient monitoring of her creatinine and weight.  Filed Weights   04/18/14 0545 04/19/14 0513 04/21/14 0450  Weight: 83.689 kg (184 lb 8 oz) 82.555 kg (182 lb) 83.643 kg (184 lb 6.4 oz)   Hyperkalemia due to acute on chronic any injury. Peak potassium of 5.7. This trended down with one dose of Kayexalate.  CKD stage IV, creatinine initially trended down with diuresis and then trended up slightly. She should have a repeat BMP done in one week.  Minimize nephrotoxins and continued to renal dose medications. She should followup with nephrology within one month.  Diabetes mellitus type 2, her oral medications were held at admission. She was started on sliding scale insulin. She may resume her home medications at discharge.  Hypertension, blood pressure was slightly elevated, however given her age and comorbidities it may be okay to tolerate slowly higher blood pressures. She continued amlodipine, hydralazine, and Lasix.  Her imdur  was increased to 120mg .  Coronary artery disease, stable, continued Imdur, aspirin, beta blocker, zetia.  Microcytic anemia, secondary to iron deficiency but no recent blood work. Her iron studies were consistent with anemia of chronic inflammation, B12 511, folate 73, TSH is below. She continued her iron supplement was given one dose of Procrit.  Sick euthyroid, TSH was 4.92 but free T4 of 1.15 and free T3 of 2.03 were within normal limits.  COPD, stable, continue Singulair and Claritin.  Depression/anxiety, stable, continue citalopram   Gout, stable, continue allopurinol, renally dosed   Right shoulder pain, likely due to arthritis and contusion from recent fall.  XR right shoulder: Osteoarthritis, no fracture.  Started Capsaicin cream and tylenol prn.  Falls, PT recommended CIR.  To rehab..    Consultants:  None Procedures:  Chest x-ray Antibiotics:  None    Discharge Exam: Filed Vitals:   04/21/14 0450  BP: 148/50  Pulse: 59  Temp: 98.4 F (36.9 C)  Resp: 18   Filed Vitals:   04/20/14 1403 04/20/14 1838 04/20/14 2106 04/21/14 0450  BP: 144/57 146/52 148/49 148/50  Pulse: 57 59 64 59  Temp: 97.9 F (36.6 C)  98 F (36.7 C) 98.4 F (36.9 C)  TempSrc: Oral  Oral Oral  Resp: 17  18 18   Height:      Weight:    83.643 kg (184 lb 6.4 oz)  SpO2: 96%  98% 98%    General:  Caucasian female, No acute distress, eating breakfast.  Increased cough. HEENT:  NCAT, MMM  Cardiovascular:  RRR, nl S1, S2, 2/6 systolic murmur in the right upper sternal border, radiation to the left sternal border, 2+ pulses, warm extremities  Respiratory:  Rales at bilateral bases, no rhonchi or wheeze, no increased WOB  Abdomen:  NABS, soft, NT/ND   MSK:  Decreased tone and bulk, trace left LEE, right AKA  Neuro:  Grossly intact    Discharge Instructions      Discharge Instructions   (HEART FAILURE PATIENTS) Call MD:  Anytime you have any of the following symptoms: 1) 3 pound weight  gain in 24 hours or 5 pounds in 1 week 2) shortness of breath, with or without a dry hacking cough 3) swelling in the hands, feet or stomach 4) if you have to sleep on extra pillows at night in order to breathe.    Complete by:  As directed      Call MD for:  difficulty breathing, headache or visual disturbances    Complete by:  As directed      Call MD for:  extreme fatigue    Complete by:  As directed      Call MD for:  hives    Complete by:  As directed      Call MD for:  persistant dizziness or light-headedness    Complete by:  As directed      Call MD for:  persistant nausea and vomiting    Complete by:  As directed      Call MD for:  severe uncontrolled pain    Complete by:  As directed      Call MD for:  temperature >100.4    Complete by:  As directed      Diet - low sodium heart healthy    Complete by:  As directed      Diet Carb Modified    Complete by:  As directed      Discharge instructions    Complete by:  As directed   You were hospitalized with heart failure exacerbation.  Please try to limit your salt to 2000mg  or less per day and drink about five 8-oz glasses of water per day.  Take your weight daily and record the numbers and if you gain more than 3-lbs in one day or 5-lbs in 7 days call the nephrology office.  You will need bloodwork done in 1 week to check your kidney function and then follow up with nephrology in 1 month or sooner as needed.  If you become more Sokha Craker of breath, please return to the hospital.     Increase activity slowly    Complete by:  As directed             Medication List         acetaminophen 500 MG tablet  Commonly known as:  TYLENOL  Take 1,000 mg by mouth 2 (two) times daily as needed for mild pain or fever.     allopurinol 100 MG tablet  Commonly known as:  ZYLOPRIM  Take 100 mg by mouth at bedtime.     amLODipine 10 MG tablet  Commonly known as:  NORVASC  Take 10 mg by mouth daily.     aspirin EC 81 MG tablet  Take 81 mg by  mouth daily.     capsaicin 0.025 % cream  Commonly known as:  ZOSTRIX  Apply topically 2 (two) times daily.     carvedilol 3.125 MG tablet  Commonly known as:  COREG  Take 3.125 mg by mouth 2 (two) times daily with a meal.     citalopram 10 MG tablet  Commonly known as:  CELEXA  Take 10 mg by mouth daily.     ezetimibe 10 MG tablet  Commonly known as:  ZETIA  Take 10 mg by mouth daily.     famotidine 20 MG tablet  Commonly known as:  PEPCID  Take 20 mg by mouth daily.     furosemide 40 MG tablet  Commonly known as:  LASIX  Take 1.5 tablets (60 mg total) by mouth 2 (two) times daily.     hydrALAZINE 100 MG tablet  Commonly known as:  APRESOLINE  Take 100 mg by mouth 3 (three) times daily.     iron polysaccharides 150 MG capsule  Commonly known as:  NIFEREX  Take 150 mg by mouth 2 (two) times daily.     isosorbide mononitrate 120 MG 24 hr tablet  Commonly known as:  IMDUR  Take 1 tablet (120 mg total) by mouth daily.     loratadine 10 MG tablet  Commonly known as:  CLARITIN  Take 10 mg by mouth daily.     montelukast 10 MG tablet  Commonly known as:  SINGULAIR  Take 10 mg by mouth at bedtime.     nystatin cream  Commonly known as:  MYCOSTATIN  Apply 1 application topically 2 (two) times daily.     polyethylene glycol packet  Commonly known as:  MIRALAX / GLYCOLAX  Take 17  g by mouth daily as needed for mild constipation.     timolol 0.5 % ophthalmic solution  Commonly known as:  BETIMOL  Place 1 drop into both eyes 2 (two) times daily.     VOL-CARE RX PO  Take 1 tablet by mouth daily.       Follow-up Information   Follow up with Simona Huh, MD. Schedule an appointment as soon as possible for a visit in 1 week.   Specialty:  Family Medicine   Contact information:   [address], [place] [address] [phone]       Follow up with [facility]. Schedule an appointment as soon as possible for a visit in 1 month.    Contact information:   [place] [id number] [phone]       The results of significant diagnostics from this hospitalization (including imaging, microbiology, ancillary and laboratory) are listed below for reference.    Significant Diagnostic Studies: Dg Chest 2 View  04/16/2014   CLINICAL DATA:  Kerstyn Coryell of breath.  EXAM: CHEST  2 VIEW  COMPARISON:  03/11/2014  FINDINGS: Hazy bilateral lung opacities with irregular interstitial opacities, consistent with pulmonary edema. There is more confluent lung base opacity likely additional atelectasis associated with small effusions.  Cardiac silhouette is mildly enlarged. Changes from CABG surgery are stable.  No pneumothorax.  Bony thorax is diffusely demineralized. There are stable arthropathic changes of the right shoulder.  IMPRESSION: Congestive heart failure with pulmonary edema and small effusions.   Electronically Signed   By: Lajean Manes M.D.   On: 04/16/2014 00:26   Dg Shoulder Right  04/16/2014   CLINICAL DATA:  Pain with recent trauma  EXAM: RIGHT SHOULDER - 2+ VIEW  COMPARISON:  None.  FINDINGS: Frontal and Y scapular images were obtained. There is no apparent acute fracture or dislocation. There is marked narrowing of the glenohumeral joint with subchondral sclerosis on both sides of the glenohumeral joint. There is mild osteoarthritic change in the acromioclavicular joint. No erosive change or bony destruction.  IMPRESSION: Severe osteoarthritic change in the glenohumeral joint. Mild osteoarthritic change in the acromioclavicular joint. No fracture or dislocation appreciable.   Electronically Signed   By: Lowella Grip M.D.   On: 04/16/2014 11:14   Dg Chest Port 1 View  04/18/2014   CLINICAL DATA:  Followup of heart failure after diuresis. coronary artery disease. COPD.  EXAM: PORTABLE CHEST - 1 VIEW  COMPARISON:  04/16/2014  FINDINGS: Prior median sternotomy. Advanced right greater than left glenohumeral joint  osteoarthritis. Mild cardiomegaly with a tortuous thoracic aorta. No pleural effusion or pneumothorax. Improved mild interstitial prominence and indistinctness. Mild bibasilar volume loss and atelectasis.  IMPRESSION: Cardiomegaly with improved, mild congestive heart failure.   Electronically Signed   By: Abigail Miyamoto M.D.   On: 04/18/2014 10:09    Microbiology: No results found for this or any previous visit (from the past 240 hour(s)).   Labs: Basic Metabolic Panel:  Recent Labs Lab 04/17/14 0532 04/18/14 0500 04/19/14 0405 04/20/14 0338 04/21/14 0427  NA 139 142 139 139 139  K 3.9 3.9 4.0 4.1 4.3  CL 102 103 102 101 98  CO2 22 24 23 24 25   GLUCOSE 85 96 109* 103* 157*  BUN 69* 80* 84* 76* 74*  CREATININE 1.93* 2.03* 2.19* 2.02* 2.04*  CALCIUM 9.0 8.5 8.6 9.0 8.9   Liver Function Tests:  Recent Labs Lab 04/15/14 2355  AST 38*  ALT 24  ALKPHOS 119*  BILITOT 0.3  PROT 7.3  ALBUMIN 3.6   No results found for this basename: LIPASE, AMYLASE,  in the last 168 hours No results found for this basename: AMMONIA,  in the last 168 hours CBC:  Recent Labs Lab 04/15/14 2355  WBC 8.7  NEUTROABS 6.9  HGB 10.1*  HCT 31.6*  MCV 72.1*  PLT 207   Cardiac Enzymes: No results found for this basename: CKTOTAL, CKMB, CKMBINDEX, TROPONINI,  in the last 168 hours BNP: BNP (last 3 results)  Recent Labs  03/09/14 2013 04/15/14 2359  PROBNP 4610.0* 3805.0*   CBG:  Recent Labs Lab 04/20/14 2031 04/21/14 0057 04/21/14 0144 04/21/14 0444 04/21/14 0751  GLUCAP 156* 75 137* 166* 136*    Time coordinating discharge: 45 minutes  Signed:  Janece Canterbury  [facility] 04/21/2014, 8:38 AM

## 2014-04-21 NOTE — Progress Notes (Signed)
Physical Medicine and Rehabilitation Consult  Reason for Consult: Deconditioning/CHF exacerbation  Referring Physician: [facility]  HPI: [name] is a 80 y.o. right-handed female with history of diastolic congestive heart failure, CAD, hypertension and chronic renal insufficiency with creatinine baseline 2.02 and history of right AKA September 2012 and received inpatient rehabilitation services and prosthesis provided by [employer] [facility]. Patient primarily wheelchair bound since January and limited use of prosthesis and assistance of her granddaughter and hired caregivers. Presented 04/16/2014 with complaints of worsening shortness of breath for 2 days as well as nonproductive cough. Noted hypoxemia of 88% in the ED was placed on 3 L oxygen. Chest x-ray showed pulmonary edema small bilateral pleural effusions. She was administered intravenous Lasix. Noted hyperkalemia 5.7 received Kayexalate. Complaints of right shoulder pain with x-rays negative for fracture but did show severe osteoarthritic changes in the glenohumeral joint. Subcutaneous Lovenox for DVT prophylaxis. Latest chest x-ray improved mild congestive heart failure. Physical and occupational therapy evaluations completed 04/17/2014 with recommendations for physical medicine rehabilitation consult  Review of Systems  Respiratory: Positive for cough and shortness of breath.  Gastrointestinal:  GERD  Musculoskeletal: Positive for myalgias.  Psychiatric/Behavioral: Positive for depression.  All other systems reviewed and are negative.   Past Medical History   Diagnosis  Date   .  DM (diabetes mellitus) type II controlled peripheral vascular disorder      A1C = 5.8   .  HTN (hypertension)    .  CHF (congestive heart failure)      EF = 16-10%, grade 2 diastolic dysfunction   .  CAD (coronary artery disease)    .  COPD (chronic obstructive pulmonary disease)    .  Cataracts, bilateral    .  Hyperlipidemia    .  Peripheral vascular  disease  bilateral     S/P femoral-popliteal bypass surgery   .  Glaucoma    .  TIA (transient ischemic attack)    .  Shortness of breath    .  Pneumonia      HX OF PNA   .  Depression    .  GERD (gastroesophageal reflux disease)    .  Arthritis      RA   .  Anemia    .  CKD (chronic kidney disease)     Past Surgical History   Procedure  Laterality  Date   .  Coronary artery bypass graft     .  Spine surgery     .  Eye surgery   cataract   .  Carpal tunnel release   left   .  Breast surgery   lumpectomy right   .  Aortogram w/ runoff   06/06/11     right leg   .  Above knee leg amputation   07/27/11     Right AKA    Family History   Problem  Relation  Age of Onset   .  Hypertension  Mother    .  Peripheral vascular disease  Mother    .  Diabetes  Father    .  Kidney disease  Sister    .  Cancer  Brother      lung   .  Lung disease  Sister    .  Lung disease  Sister    .  Kidney disease  Daughter      On dialysis, died in 11-Mar-2006    Social History: reports that she quit smoking about 37 years  ago. Her smoking use included Cigarettes. She has a 40 pack-year smoking history. She has never used smokeless tobacco. She reports that she does not drink alcohol or use illicit drugs.  Allergies:  Allergies   Allergen  Reactions   .  Codeine  Other (See Comments)     REACTION: unknown   .  Omnipaque [Iohexol]  Other (See Comments)     REACTION: unknown   .  Celebrex [Celecoxib]  Rash   .  Iodine  Rash    Medications Prior to Admission   Medication  Sig  Dispense  Refill   .  acetaminophen (TYLENOL) 500 MG tablet  Take 1,000 mg by mouth 2 (two) times daily as needed for mild pain or fever.     Marland Kitchen  allopurinol (ZYLOPRIM) 100 MG tablet  Take 100 mg by mouth at bedtime.     Marland Kitchen  amLODipine (NORVASC) 10 MG tablet  Take 10 mg by mouth daily.     Marland Kitchen  aspirin EC 81 MG tablet  Take 81 mg by mouth daily.     .  B Complex-C-Folic Acid (VOL-CARE RX PO)  Take 1 tablet by mouth daily.     .   carvedilol (COREG) 3.125 MG tablet  Take 3.125 mg by mouth 2 (two) times daily with a meal.     .  citalopram (CELEXA) 10 MG tablet  Take 10 mg by mouth daily.     Marland Kitchen  ezetimibe (ZETIA) 10 MG tablet  Take 10 mg by mouth daily.     .  famotidine (PEPCID) 20 MG tablet  Take 20 mg by mouth daily.     .  hydrALAZINE (APRESOLINE) 100 MG tablet  Take 100 mg by mouth 3 (three) times daily.     .  iron polysaccharides (NIFEREX) 150 MG capsule  Take 150 mg by mouth 2 (two) times daily.     .  isosorbide mononitrate (IMDUR) 60 MG 24 hr tablet  Take 1 tablet (60 mg total) by mouth daily.  30 tablet  0   .  loratadine (CLARITIN) 10 MG tablet  Take 10 mg by mouth daily.     .  montelukast (SINGULAIR) 10 MG tablet  Take 10 mg by mouth at bedtime.     Marland Kitchen  nystatin cream (MYCOSTATIN)  Apply 1 application topically 2 (two) times daily.     .  polyethylene glycol (MIRALAX / GLYCOLAX) packet  Take 17 g by mouth daily as needed for mild constipation.     .  timolol (BETIMOL) 0.5 % ophthalmic solution  Place 1 drop into both eyes 2 (two) times daily.     .  [DISCONTINUED] furosemide (LASIX) 20 MG tablet  Take 20 mg by mouth daily.      Home:  Home Living  Family/patient expects to be discharged to:: Private residence  Living Arrangements: Other (Comment) (granddaughter who is RN on [name])  Available Help at Discharge: Family;Personal care attendant;Available PRN/intermittently  Type of Home: House  Home Access: Ramped entrance  Home Layout: One level  Home Equipment: Shower seat;Bedside commode;Wheelchair - power;Wheelchair - manual (Drop arm BSC)  Additional Comments: Pt had PC 9-3 M-F; 9-2 Sat; 9-1 on Sun.  Functional History:  Prior Function  Level of Independence: Needs assistance  Gait / Transfers Assistance Needed: Pt in non ambulatory. Only pivots to w/c and BSC. Has not been able to walk for 3 years. She reports she scoots to electric WC and scoots to drop arm  BSC  ADL's / Homemaking Assistance  Needed: Pt able to bathe UB, mod A with LB; dons sock with min - mod A (dependeng on day). Requires assist with grooming and UB dressing due to limitations with shoulders. Pt with h/o falls at home  Functional Status:  Mobility:  Bed Mobility  General bed mobility comments: Pt sitting EOB upon arrival  Transfers  Overall transfer level: Needs assistance  Equipment used: None  Transfers: Sit to/from Stand  Sit to Stand: Max assist  Squat pivot transfers: Max assist  General transfer comment: Attempted x 3 from low recliner chair. Unable with gait belt or with bed pad in chair. Pt pushes posteriorly when attempting to stand from low recliner.  Ambulation/Gait  General Gait Details: unable, non ambulatory x 3 years  Wheelchair Mobility  Wheelchair mobility: (uses a power chair at home)  ADL:  ADL  Overall ADL's : Needs assistance/impaired  Eating/Feeding: Independent;Sitting  Grooming: Wash/dry hands;Set up;Sitting  Upper Body Bathing: Moderate assistance;Sitting  Lower Body Bathing: Maximal assistance;Sit to/from stand  Upper Body Dressing : Moderate assistance;Sitting  Lower Body Dressing: Maximal assistance;Sit to/from stand  Lower Body Dressing Details (indicate cue type and reason): Pt was able to don sock with min A in long sitting  Toilet Transfer: Maximal assistance;Squat-pivot;BSC  Toileting- Clothing Manipulation and Hygiene: Total assistance;Sit to/from stand  Functional mobility during ADLs: Maximal assistance  General ADL Comments: Spoke with pt re: wearing shoe at home to protect Lt. foot during transfers. she reports she is not able to pivot as easily with shoe on, but is willing to try when she gets home. [place] daughter present during eval and is aware of pt current status. Discussed options of performing sliding board or scoot transfers at hom  Cognition:  Cognition  Overall Cognitive Status: Within Functional Limits for tasks assessed  Cognition  Arousal/Alertness:  Awake/alert  Behavior During Therapy: WFL for tasks assessed/performed  Overall Cognitive Status: Within Functional Limits for tasks assessed  Memory: (possibly mild STM deficits, had difficulty with situation)  Blood pressure 147/52, pulse 66, temperature 98.7 F (37.1 C), temperature source Oral, resp. rate 14, height 5\' 2"  (1.575 m), weight 82.555 kg (182 lb), SpO2 100.00%.  Physical Exam  Constitutional: She is oriented to person, place, and time.  HENT:  Head: Normocephalic.  Eyes: EOM are normal.  Neck: Normal range of motion. Neck supple. No thyromegaly present.  Cardiovascular: Normal rate and regular rhythm.  Respiratory:  Decreased breath sounds clear to auscultation  GI: Soft. Bowel sounds are normal. She exhibits no distension.  Neurological: She is alert and oriented to person, place, and time.  Follows commands. UE's 4+/ RLE 3+HF, LLE 3+ HF, 4KE and ADF/APF. Decreased LT below the left knee and in the hands to a lesser extent.  Skin:  Right AKA well healed  Psychiatric: She has a normal mood and affect. Her behavior is normal. Thought content normal.   Results for orders placed during the hospital encounter of 04/15/14 (from the past 24 hour(s))   GLUCOSE, CAPILLARY Status: Abnormal    Collection Time    04/19/14 7:25 AM   Result  Value  Ref Range    Glucose-Capillary  118 (*)  70 - 99 mg/dL   GLUCOSE, CAPILLARY Status: Abnormal    Collection Time    04/19/14 11:46 AM   Result  Value  Ref Range    Glucose-Capillary  209 (*)  70 - 99 mg/dL   OCCULT BLOOD X 1 CARD TO  LAB, STOOL Status: None    Collection Time    04/19/14 1:58 PM   Result  Value  Ref Range    Fecal Occult Bld  NEGATIVE  NEGATIVE   GLUCOSE, CAPILLARY Status: Abnormal    Collection Time    04/19/14 4:41 PM   Result  Value  Ref Range    Glucose-Capillary  152 (*)  70 - 99 mg/dL   GLUCOSE, CAPILLARY Status: Abnormal    Collection Time    04/19/14 8:26 PM   Result  Value  Ref Range     Glucose-Capillary  132 (*)  70 - 99 mg/dL   GLUCOSE, CAPILLARY Status: Abnormal    Collection Time    04/20/14 12:27 AM   Result  Value  Ref Range    Glucose-Capillary  123 (*)  70 - 99 mg/dL   BASIC METABOLIC PANEL Status: Abnormal    Collection Time    04/20/14 3:38 AM   Result  Value  Ref Range    Sodium  139  137 - 147 mEq/L    Potassium  4.1  3.7 - 5.3 mEq/L    Chloride  101  96 - 112 mEq/L    CO2  24  19 - 32 mEq/L    Glucose, Bld  103 (*)  70 - 99 mg/dL    BUN  76 (*)  6 - 23 mg/dL    Creatinine, Ser  2.02 (*)  0.50 - 1.10 mg/dL    Calcium  9.0  8.4 - 10.5 mg/dL    GFR calc non Af Amer  21 (*)  >90 mL/min    GFR calc Af Amer  25 (*)  >90 mL/min   GLUCOSE, CAPILLARY Status: Abnormal    Collection Time    04/20/14 4:13 AM   Result  Value  Ref Range    Glucose-Capillary  108 (*)  70 - 99 mg/dL    Dg Chest Port 1 View  04/18/2014 CLINICAL DATA: Followup of heart failure after diuresis. coronary artery disease. COPD. EXAM: PORTABLE CHEST - 1 VIEW COMPARISON: 04/16/2014 FINDINGS: Prior median sternotomy. Advanced right greater than left glenohumeral joint osteoarthritis. Mild cardiomegaly with a tortuous thoracic aorta. No pleural effusion or pneumothorax. Improved mild interstitial prominence and indistinctness. Mild bibasilar volume loss and atelectasis. IMPRESSION: Cardiomegaly with improved, mild congestive heart failure. Electronically Signed By: Abigail Miyamoto M.D. On: 04/18/2014 10:09   Assessment/Plan:  Diagnosis: deconditioning after multiple medical issues. Previous right AKA  1. Does the need for close, 24 hr/day medical supervision in concert with the patient's rehab needs make it unreasonable for this patient to be served in a less intensive setting? Yes 2. Co-Morbidities requiring supervision/potential complications: dm, copd, chf, htn 3. Due to bladder management, bowel management, safety, skin/wound care, disease management, medication administration, pain management and  patient education, does the patient require 24 hr/day rehab nursing? Yes 4. Does the patient require coordinated care of a physician, rehab nurse, PT (1-2 hrs/day, 5 days/week) and OT (1-2 hrs/day, 5 days/week) to address physical and functional deficits in the context of the above medical diagnosis(es)? Yes Addressing deficits in the following areas: balance, endurance, locomotion, strength, transferring, bowel/bladder control, bathing, dressing, feeding, grooming, toileting and psychosocial support 5. Can the patient actively participate in an intensive therapy program of at least 3 hrs of therapy per day at least 5 days per week? Yes 6. The potential for patient to make measurable gains while on inpatient rehab is excellent 7. Anticipated functional outcomes  upon discharge from inpatient rehab are modified independent and supervision with PT, modified independent and min assist with OT, n/a with SLP. 8. Estimated rehab length of stay to reach the above functional goals is: 12-14 days 9. Does the patient have adequate social supports to accommodate these discharge functional goals? Yes 10. Anticipated D/C setting: Home 11. Anticipated post D/C treatments: [place] therapy 12. Overall Rehab/Functional Prognosis: good RECOMMENDATIONS:  This patient's condition is appropriate for continued rehabilitative care in the following setting: CIR  Patient has agreed to participate in recommended program. Yes  Note that insurance prior authorization may be required for reimbursement for recommended care.  Comment: Rehab Admissions Coordinator to follow up.  Thanks,  Meredith Staggers, MD, Mellody Drown  04/20/2014  Revision History...      Date/Time User Action    04/20/2014 3:55 PM Meredith Staggers, MD Sign    04/20/2014 6:59 AM Cathlyn Parsons, PA-C [place]   View Details Report    Routing History.Marland KitchenMarland Kitchen

## 2014-04-22 ENCOUNTER — Inpatient Hospital Stay (HOSPITAL_COMMUNITY): Payer: Medicare Other | Admitting: Occupational Therapy

## 2014-04-22 ENCOUNTER — Inpatient Hospital Stay (HOSPITAL_COMMUNITY): Payer: Medicare Other | Admitting: Physical Therapy

## 2014-04-22 DIAGNOSIS — R5381 Other malaise: Secondary | ICD-10-CM

## 2014-04-22 DIAGNOSIS — N184 Chronic kidney disease, stage 4 (severe): Secondary | ICD-10-CM

## 2014-04-22 DIAGNOSIS — I5031 Acute diastolic (congestive) heart failure: Secondary | ICD-10-CM

## 2014-04-22 DIAGNOSIS — I10 Essential (primary) hypertension: Secondary | ICD-10-CM

## 2014-04-22 DIAGNOSIS — E119 Type 2 diabetes mellitus without complications: Secondary | ICD-10-CM

## 2014-04-22 LAB — GLUCOSE, CAPILLARY
Glucose-Capillary: 102 mg/dL — ABNORMAL HIGH (ref 70–99)
Glucose-Capillary: 129 mg/dL — ABNORMAL HIGH (ref 70–99)
Glucose-Capillary: 142 mg/dL — ABNORMAL HIGH (ref 70–99)
Glucose-Capillary: 155 mg/dL — ABNORMAL HIGH (ref 70–99)
Glucose-Capillary: 96 mg/dL (ref 70–99)
Glucose-Capillary: 98 mg/dL (ref 70–99)

## 2014-04-22 LAB — CBC WITH DIFFERENTIAL/PLATELET
Basophils Absolute: 0 10*3/uL (ref 0.0–0.1)
Basophils Relative: 0 % (ref 0–1)
Eosinophils Absolute: 0.3 10*3/uL (ref 0.0–0.7)
Eosinophils Relative: 3 % (ref 0–5)
HCT: 30.6 % — ABNORMAL LOW (ref 36.0–46.0)
HEMOGLOBIN: 9.6 g/dL — AB (ref 12.0–15.0)
LYMPHS ABS: 1.4 10*3/uL (ref 0.7–4.0)
LYMPHS PCT: 15 % (ref 12–46)
MCH: 22.7 pg — AB (ref 26.0–34.0)
MCHC: 31.4 g/dL (ref 30.0–36.0)
MCV: 72.5 fL — ABNORMAL LOW (ref 78.0–100.0)
MONOS PCT: 6 % (ref 3–12)
Monocytes Absolute: 0.5 10*3/uL (ref 0.1–1.0)
NEUTROS PCT: 76 % (ref 43–77)
Neutro Abs: 6.8 10*3/uL (ref 1.7–7.7)
Platelets: 267 10*3/uL (ref 150–400)
RBC: 4.22 MIL/uL (ref 3.87–5.11)
RDW: 17.6 % — ABNORMAL HIGH (ref 11.5–15.5)
WBC: 9 10*3/uL (ref 4.0–10.5)

## 2014-04-22 LAB — COMPREHENSIVE METABOLIC PANEL
ALK PHOS: 104 U/L (ref 39–117)
ALT: 13 U/L (ref 0–35)
AST: 21 U/L (ref 0–37)
Albumin: 3.3 g/dL — ABNORMAL LOW (ref 3.5–5.2)
BILIRUBIN TOTAL: 0.2 mg/dL — AB (ref 0.3–1.2)
BUN: 75 mg/dL — AB (ref 6–23)
CHLORIDE: 102 meq/L (ref 96–112)
CO2: 26 meq/L (ref 19–32)
Calcium: 9.4 mg/dL (ref 8.4–10.5)
Creatinine, Ser: 2.01 mg/dL — ABNORMAL HIGH (ref 0.50–1.10)
GFR, EST AFRICAN AMERICAN: 25 mL/min — AB (ref >90)
GFR, EST NON AFRICAN AMERICAN: 21 mL/min — AB (ref >90)
GLUCOSE: 161 mg/dL — AB (ref 70–99)
POTASSIUM: 4.4 meq/L (ref 3.7–5.3)
Sodium: 144 mEq/L (ref 137–147)
TOTAL PROTEIN: 7.1 g/dL (ref 6.0–8.3)

## 2014-04-22 NOTE — Evaluation (Signed)
Physical Therapy Assessment and Plan  Patient Details  Name: [name] MRN: [id number] Date of Birth: September 13, 1927  PT Diagnosis: Abnormal posture, Difficulty walking, Impaired sensation, Muscle weakness, Pain in joint and Pain in LLE, Decreased endurance Rehab Potential: Good ELOS: 10-14 days   Today's Date: 04/22/2014 Time: 0830-0930  Time Calculation (min): 60 min  Problem List:  Patient Active Problem List   Diagnosis Date Noted  . Hyperkalemia 04/21/2014  . Sick-euthyroid syndrome 04/21/2014  . CHF (congestive heart failure) 04/16/2014  . C. difficile colitis 06/26/2012  . Acute on chronic renal failure 06/24/2012  . Pneumonia 06/23/2012  . Acute diastolic CHF (congestive heart failure) 06/22/2012  . SIRS (systemic inflammatory response syndrome) 06/22/2012  . DM2 (diabetes mellitus, type 2) 06/22/2012  . CKD (chronic kidney disease), stage IV 06/22/2012  . HTN (hypertension) 06/22/2012  . Breast cancer 06/22/2012  . PVD (peripheral vascular disease) 06/22/2012  . COPD (chronic obstructive pulmonary disease) 06/22/2012  . S/P AKA (above knee amputation) 08/28/2011  . Atherosclerosis of native arteries of the extremities with ulceration 07/24/2011    Past Medical History:  Past Medical History  Diagnosis Date  . DM (diabetes mellitus) type II controlled peripheral vascular disorder     A1C = 5.8  . HTN (hypertension)   . CHF (congestive heart failure)     EF = 50-55%, grade 2 diastolic dysfunction  . CAD (coronary artery disease)   . COPD (chronic obstructive pulmonary disease)   . Cataracts, bilateral   . Hyperlipidemia   . Peripheral vascular disease bilateral    S/P femoral-popliteal bypass surgery  . Glaucoma   . TIA (transient ischemic attack)   . Shortness of breath   . Pneumonia     HX OF PNA  . Depression   . GERD (gastroesophageal reflux disease)   . Arthritis     RA  . Anemia   . CKD (chronic kidney disease)    Past Surgical History:  Past  Surgical History  Procedure Laterality Date  . Coronary artery bypass graft    . Spine surgery    . Eye surgery  cataract  . Carpal tunnel release  left  . Breast surgery  lumpectomy right  . Aortogram w/ runoff  06/06/11    right leg  . Above knee leg amputation  07/27/11    Right AKA    Assessment & Plan Clinical Impression:  [name] is a 80 y.o. right-handed female with history of diabetes mellitus with peripheral neuropathy, diastolic congestive heart failure, CAD, hypertension and chronic renal insufficiency with creatinine baseline 2.02 and history of right AKA September 2012 and received inpatient rehabilitation services and prosthesis provided by [employer] [facility]. Patient primarily wheelchair bound since January and limited use of prosthesis and assistance of her granddaughter and hired caregivers. Presented 04/16/2014 with complaints of worsening shortness of breath for 2 days as well as nonproductive cough. Noted hypoxemia of 88% in the ED was placed on 3 L oxygen. Chest x-ray showed pulmonary edema small bilateral pleural effusions. She was administered intravenous Lasix. Noted hyperkalemia 5.7 received Kayexalate. Complaints of right shoulder pain with x-rays negative for fracture but did show severe osteoarthritic changes in the glenohumeral joint. Subcutaneous Lovenox for DVT prophylaxis. Latest chest x-ray improved mild congestive heart failure. Patient transferred to CIR on 04/21/2014 .   Patient currently requires mod -max A with mobility secondary to muscle weakness, decreased cardiorespiratory endurance, BUE (R>L) shoulder OA and decreased ROM, and decreased joint mobility. Prior to  hospitalization, patient was modified independent  to min assist with mobility and lived with Family (Granddaughter [name]) in a House home.  Home access is  Ramped entrance.  Patient will benefit from skilled PT intervention to maximize safe functional mobility, minimize fall risk and  decrease caregiver burden for planned discharge home with intermittent assist-Supervision.  Anticipate patient will benefit from follow up [place] at discharge.  PT - End of Session Activity Tolerance: Decreased this session;Tolerates 30+ min activity with multiple rests Endurance Deficit: Yes Endurance Deficit Description: Fatigues easily, monitor 02 sats, pt 88-89% on RA with activity PT Assessment Rehab Potential: Good PT Plan PT Intensity: Minimum of 1-2 x/day ,45 to 90 minutes PT Frequency: 5 out of 7 days PT Duration Estimated Length of Stay: 10-14 days PT Treatment/Interventions: Balance/vestibular training;DME/adaptive equipment instruction;Functional mobility training;Neuromuscular re-education;Pain management;Patient/family education;Therapeutic Activities;Therapeutic Exercise;UE/LE Strength taining/ROM;UE/LE Coordination activities;Wheelchair propulsion/positioning PT Recommendation Follow Up Recommendations: Home health PT;24 hour supervision/assistance Patient destination: Home Equipment Details: pt has scooter and BSC, reports having manual w/c that is too small and old walker  Skilled Therapeutic Intervention Skilled therapeutic intervention initiated after completion of evaluation. Discussed falls risk, safety within room, and focus of therapy during stay. Discussed possible LOS, goals, and f/u therapy. Pt verbalized understanding and familiar to CIR. Pt initially on 1 L 02 via [place] with 02 sats >97%. Pt performed remainder of evaluation/treatment with 02 sats 88-90% on RA with activity. Pt with no c/o SOB and 02 sats increased with instruction in pursed lip breathing quickly to > 90%.   PT Evaluation Precautions/Restrictions Precautions Precautions: Fall;Other (comment) (DNR) Precaution Comments: R AKA, doesn't stand or walk Restrictions Weight Bearing Restrictions: No General Chart Reviewed: Yes Family/Caregiver Present: No  Vital SignsTherapy Vitals Temp: 98.2 F (36.8  C) Temp src: Oral Pulse Rate: 60 Resp: 18 BP: 150/56 mmHg Patient Position (if appropriate): Sitting Oxygen Therapy SpO2: 94 % O2 Device: None (Room air) Pulse Oximetry Type: Intermittent Pain Pain Assessment Pain Assessment: No/denies pain Home Living/Prior Functioning Home Living Living Arrangements: Other relatives Available Help at Discharge: Family;Personal care attendant;Available PRN/intermittently Type of Home: House Home Access: Ramped entrance Home Layout: One level Additional Comments: Pt had PC 9-3 M-F; 9-2 Sat; 9-1 Sun  Lives With: Family (Granddaughter [name]) Prior Function Level of Independence: Needs assistance with homemaking;Needs assistance with ADLs;Needs assistance with tranfers (Family and personal care attendants provided assist PTA)  Able to Take Stairs?: No Driving: No Vocation: Retired Comments: watch TV, crossword puzzles Vision/Perception  Wears reading glasses, Left eye blurry at times per patient   Vision impaired- to be further tested in functional context per OT Cognition Overall Cognitive Status: Within Functional Limits for tasks assessed Arousal/Alertness: Awake/alert Orientation Level: Oriented X4 Sensation Sensation Light Touch: Impaired by gross assessment Additional Comments: numbness/tingling in RUE, decreased sensation RLE vs LLE, phantom pain in RLE Coordination Gross Motor Movements are Fluid and Coordinated: No Fine Motor Movements are Fluid and Coordinated: No Coordination and Movement Description: R & L w/ fine motor impairments, right hand w/ arthritis, paresthesias/impaired sensibility and inability to perform full fist. R UE AROM for shoulder flexion approximately 80* at best, ~95-100* LUE. Significant crepitus bilateral UE's, R > L impacting WB and ability to assist w/ ADL's and transfers. Severe osteoarthritic changes in the glenohumeral joint right.  Motor  Motor Motor: Other (comment);Abnormal postural alignment and  control (Right severe osteoarthritic changes in the glenohumeral joint.  L AKA 2012, does not ambulate or stand (x3 yrs) transfers from EOB to  w/c/scooter, wide 3:1 etc.w/ squat pivot or lateral scoot.) Motor - Skilled Clinical Observations: R severe OA in glenohumeral joint. R AKA s/p 2012, pt has been non-ambulatory for past 3 yrs, limited/no ability to use RUE to assist with transfers, pt performed squat pivot/stand pivot or lateral scoot transfers  Mobility Bed Mobility Bed Mobility: Rolling Left;Supine to Sit;Sit to Supine Rolling Left: 2: Max assist Rolling Left Details: Verbal cues for technique;Tactile cues for placement;Other (comment);Tactile cues for initiation;Tactile cues for sequencing (Assist for RUE due to OA) Supine to Sit: HOB flat;2: Max assist (mat table) Supine to Sit Details: Manual facilitation for weight shifting;Manual facilitation for placement;Verbal cues for technique Sit to Supine: 2: Max assist Sit to Supine - Details: Manual facilitation for weight shifting;Manual facilitation for placement Transfers Transfers: Yes Sit to Stand: Other (comment) (attempted by pulling up on // bars, unable to perform with max A x 1 from w/c) Squat Pivot Transfers: 3: Mod assist;2: Max assist;With armrests Squat Pivot Transfer Details: Manual facilitation for weight shifting;Verbal cues for sequencing;Verbal cues for technique;Manual facilitation for placement Locomotion  Ambulation Ambulation: No (pt non-ambulatory x 3 yrs) Gait Gait: No (pt non-ambulatory x 3 yrs) Stairs / Additional Locomotion Stairs: No Architect: Yes Wheelchair Assistance: 2: Max Technical sales engineer Details: Verbal cues for Marketing executive: Left upper extremity (unable to reach ground with LLE and unable to use R UE 2/2 pain and OA) Wheelchair Parts Management: Supervision/cueing (brakes and arm rests) Distance: 25  Trunk/Postural Assessment   Cervical Assessment Cervical Assessment: Within Functional Limits Thoracic Assessment Thoracic Assessment: Within Functional Limits Lumbar Assessment Lumbar Assessment: Within Functional Limits Postural Control Postural Control: Within Functional Limits  Balance Balance Balance Assessed: Yes Static Sitting Balance Static Sitting - Balance Support:  (LLE supported) Dynamic Sitting Balance Dynamic Sitting - Balance Support: No upper extremity supported (LLE supported) Dynamic Sitting - Level of Assistance: 5: Stand by assistance Dynamic Sitting - Balance Activities: Lateral lean/weight shifting;Forward lean/weight shifting;Reaching for objects Extremity Assessment  RUE Assessment RUE Assessment: Exceptions to [facility] RUE AROM (degrees) Overall AROM Right Upper Extremity: Deficits;Due to premorbid status;Other (comment) (severe osteoarthritic changes in the glenohumeral joint, significant crepeitus, shoulder flexion ~80* at best. Impaired fine motor coordination as well w/ h/o paresthesias and inability to fully make fist. ) LUE Assessment LUE Assessment: Exceptions to [facility] (Impaired, AROM shoulder flexion ~90-100*) RLE Assessment RLE Assessment: Exceptions to [facility] RLE AROM (degrees) Overall AROM Right Lower Extremity: Deficits RLE Overall AROM Comments: unable to perform hip ext to neutral, unable to isolate hip abduction, hip flex/add limited by body habitus RLE Strength RLE Overall Strength: Deficits RLE Overall Strength Comments: 4/5 hip flex, unable to isolate hip abduction without hip flexor compensation, unable to complete hip extension to neutral and weak LLE Assessment LLE Assessment: Exceptions to [facility] LLE Strength LLE Overall Strength: Deficits LLE Overall Strength Comments: overall 4/5 throughout except PF (weak, not formally tested)  FIM:  FIM - Control and instrumentation engineer Devices: Arm rests Bed/Chair Transfer: 2: Supine > Sit: Max A (lifting  assist/Pt. 25-49%);2: Chair or W/C > Bed: Max A (lift and lower assist);2: Bed > Chair or W/C: Max A (lift and lower assist);2: Sit > Supine: Max A (lifting assist/Pt. 25-49%) FIM - Locomotion: Wheelchair Distance: 25 Locomotion: Wheelchair: 1: Travels less than 50 ft with maximal assistance (Pt: 25 - 49%) (LUE only) FIM - Locomotion: Ambulation Locomotion: Ambulation: 0: Activity did not occur (pt non-ambulatory) FIM - Locomotion: Stairs Locomotion: Stairs:  0: Activity did not occur (pt non-ambulatory)   Refer to Care Plan for Long Term Goals  Recommendations for other services: None  Discharge Criteria: Patient will be discharged from PT if patient refuses treatment 3 consecutive times without medical reason, if treatment goals not met, if there is a change in medical status, if patient makes no progress towards goals or if patient is discharged from hospital.  The above assessment, treatment plan, treatment alternatives and goals were discussed and mutually agreed upon: by patient  Laretta Alstrom 04/22/2014, 5:02 PM

## 2014-04-22 NOTE — Evaluation (Signed)
Occupational Therapy Assessment and Plan  Patient Details  Name: [name] MRN: [id number] Date of Birth: 12-Jun-1927  OT Diagnosis: muscle weakness (generalized), pain in joint and pulmonary edema small bilateral pleural effusions, decreased endurance, decreased activity tolerance, h/o falls, SOB. Rehab Potential: Rehab Potential: Good ELOS: 10-14 days   Today's Date: 04/22/2014 Time: 0725-0830 Time Calculation (min): 65 min  Problem List:  Patient Active Problem List   Diagnosis Date Noted  . Hyperkalemia 04/21/2014  . Sick-euthyroid syndrome 04/21/2014  . CHF (congestive heart failure) 04/16/2014  . C. difficile colitis 06/26/2012  . Acute on chronic renal failure 06/24/2012  . Pneumonia 06/23/2012  . Acute diastolic CHF (congestive heart failure) 06/22/2012  . SIRS (systemic inflammatory response syndrome) 06/22/2012  . DM2 (diabetes mellitus, type 2) 06/22/2012  . CKD (chronic kidney disease), stage IV 06/22/2012  . HTN (hypertension) 06/22/2012  . Breast cancer 06/22/2012  . PVD (peripheral vascular disease) 06/22/2012  . COPD (chronic obstructive pulmonary disease) 06/22/2012  . S/P AKA (above knee amputation) 08/28/2011  . Atherosclerosis of native arteries of the extremities with ulceration 07/24/2011    Past Medical History:  Past Medical History  Diagnosis Date  . DM (diabetes mellitus) type II controlled peripheral vascular disorder     A1C = 5.8  . HTN (hypertension)   . CHF (congestive heart failure)     EF = 25-95%, grade 2 diastolic dysfunction  . CAD (coronary artery disease)   . COPD (chronic obstructive pulmonary disease)   . Cataracts, bilateral   . Hyperlipidemia   . Peripheral vascular disease bilateral    S/P femoral-popliteal bypass surgery  . Glaucoma   . TIA (transient ischemic attack)   . Shortness of breath   . Pneumonia     HX OF PNA  . Depression   . GERD (gastroesophageal reflux disease)   . Arthritis     RA  . Anemia   . CKD  (chronic kidney disease)    Past Surgical History:  Past Surgical History  Procedure Laterality Date  . Coronary artery bypass graft    . Spine surgery    . Eye surgery  cataract  . Carpal tunnel release  left  . Breast surgery  lumpectomy right  . Aortogram w/ runoff  06/06/11    right leg  . Above knee leg amputation  07/27/11    Right AKA    Assessment & Plan Clinical Impression: [name] is a 80 y.o. right-handed female with history of diabetes mellitus with peripheral neuropathy, diastolic congestive heart failure, CAD, hypertension and chronic renal insufficiency with creatinine baseline 2.02 and history of right AKA September 2012 and received inpatient rehabilitation services and prosthesis provided by [employer] [facility]. Patient primarily wheelchair bound since January and limited use of prosthesis and assistance of her granddaughter and hired caregivers. Presented/admitted on 04/16/2014 with complaints of worsening shortness of breath for 2 days as well as nonproductive cough. Noted hypoxemia of 88% in the ED was placed on 3 L oxygen. Chest x-ray showed pulmonary edema small bilateral pleural effusions. She was administered intravenous Lasix. Noted hyperkalemia 5.7 received Kayexalate. Complaints of right shoulder pain with x-rays negative for fracture but did show severe osteoarthritic changes in the glenohumeral joint. Subcutaneous Lovenox for DVT prophylaxis. Latest chest x-ray improved mild congestive heart failure. Acute Physical and occupational therapy evaluations were completed 04/17/2014 with recommendations for physical medicine rehabilitation consult. Admitted for comprehensive rehabilitation program. Patient transferred to CIR on 04/21/2014 .    Patient  currently requires Mod - total assistance with basic self-care skills and IADL secondary to muscle weakness, muscle joint tightness, pulmonary edema small bilateral pleural effusions, severe right UE glenohumeral joint  arthritis and decreased cardiorespiratoy endurance/cardiopulmonary stamina.  Prior to hospitalization, patient could complete ADL's with min A-supervision.  Patient will benefit from skilled intervention to increase independence with basic self-care skills and increase level of independence with iADL prior to discharge home with care partner/family & personal care attendants.  Anticipate patient will require intermittent supervision and minimal physical assistance and follow up home health OT.  OT - End of Session Activity Tolerance: Tolerates 10 - 20 min activity with multiple rests Endurance Deficit: Yes Endurance Deficit Description:  (Fatigues easily, monitor O2 Sats (range during assessment 92-98% on and off of 2 L O2 via nasal cannula)) OT Assessment Rehab Potential: Good OT Patient demonstrates impairments in the following area(s): Balance;Endurance;Motor;Safety;Sensory OT Basic ADL's Functional Problem(s): Eating;Grooming;Bathing;Dressing;Toileting OT Advanced ADL's Functional Problem(s): Simple Meal Preparation (Simple snack prep from w/c level) OT Transfers Functional Problem(s): Toilet;Tub/Shower OT Additional Impairment(s): Fuctional Use of Upper Extremity OT Plan OT Intensity: Minimum of 1-2 x/day, 45 to 90 minutes OT Frequency: Total of 15 hours over 7 days OT Duration/Estimated Length of Stay: 10-14 days OT Treatment/Interventions: Balance/vestibular training;Discharge planning;Functional mobility training;DME/adaptive equipment instruction;Patient/family education;Therapeutic Activities;Therapeutic Exercise;Self Care/advanced ADL retraining;UE/LE Strength taining/ROM;UE/LE Coordination activities;Wheelchair propulsion/positioning OT Self Feeding Anticipated Outcome(s): Mod I w/set up w/ A/E PRN OT Basic Self-Care Anticipated Outcome(s): Supervision-Min A w/ set up & A/E PRN OT Toileting Anticipated Outcome(s): Min Assist w/ A/E PRN OT Bathroom Transfers Anticipated Outcome(s):  Min Assist squat pivot/lateral scoot transfer w/c-cahir/wide drop arm 3:1 OT Recommendation Patient destination: Home Follow Up Recommendations: Home health OT;24 hour supervision/assistance Equipment Recommended: To be determined   Skilled Therapeutic Intervention Pt was seen today for OT assessment followed by individual ADL retraining session with focus on functional mobility (squat pivot/lateral scoot transfer & bed mobility), safety, cardiopulmonary endurance, patient education and weight shifting/static sitting balance while performing bathing and dressing sitting EOB and bed level. O2 SATs throughout session were 92-98%. Pt was initially on 2L O2 via nasal cannula at 93% which was removed during bathing sitting EOB and transfer EOB to w/c, pt was noted to be 98% after transfer w/o O2 on, continue to monitor. Pt required overall total A for LB dressing and bathing and Mod A for UB bathing and dressing today. She benefits from vc's and tc's to weight shift as she had a tendency to lean posteriorly and to the left during ADL's. Pt with significant crepitus bilateral UE's, R > L impacting WB and ability to assist w/ ADL's and transfers due to severe osteoarthritic changes in the glenohumeral joint right.   OT Evaluation Precautions/Restrictions  Precautions Precautions: Fall;Other (comment) (DNR) Precaution Comments: R AKA, doesn't stand or walk Restrictions Weight Bearing Restrictions: No General Chart Reviewed: Yes Family/Caregiver Present: No Vital Signs Therapy Vitals Temp: 98.7 F (37.1 C) Temp src: Oral Pulse Rate: 61 Resp: 18 BP: 146/49 mmHg Patient Position (if appropriate): Lying Oxygen Therapy SpO2: 95 % O2 Device: Nasal cannula O2 Flow Rate (L/min): 2 L/min Pain Pain Assessment Pain Assessment: No/denies pain Pain Score: 0-No pain Multiple Pain Sites: No Home Living/Prior Functioning Home Living Living Arrangements: Other (Comment) (with grand  daughter) Available Help at Discharge: Family;Personal care attendant;Available PRN/intermittently Type of Home: House Home Access: Ramped entrance Home Layout: One level Additional Comments: Pt had PC 9-3 M-F; 9-2 Sat; 9-1 Sun  Lives With:  Family (Granddaughter IT sales professional) IADL History Homemaking Responsibilities: Yes Meal Prep Responsibility: No (Sometimes gets snack) Laundry Responsibility: No Cleaning Responsibility: No Bill Paying/Finance Responsibility: No Shopping Responsibility: No Child Care Responsibility: No Current License: No Occupation: Retired IADL Comments: Has personal care attendents during day M-F 9-3; Sat 9-2 and Sun 9-1 Prior Function Level of Independence: Needs assistance with homemaking;Needs assistance with ADLs;Needs assistance with tranfers (Family and personal care attendants provided assist PTA) Homemaking Assistance Comments: Pt has assistance for homemaking tasks as stated above w/ family and personal care attendents.  Able to Take Stairs?: No Driving: No Vocation: Retired Comments: watch TV, crossword puzzles ADL   Vision/Perception  Vision- History Baseline Vision/History: Wears glasses Wears Glasses: Reading only Patient Visual Report: Other (comment) (Left eye can be blurry at times per pt.) Vision- Assessment Vision Assessment?: Vision impaired- to be further tested in functional context  Cognition Overall Cognitive Status: Within Functional Limits for tasks assessed Arousal/Alertness: Awake/alert Orientation Level: Oriented X4 Sensation Sensation Light Touch: Impaired by gross assessment (numbness/tingling in RUE) Additional Comments: phantom pain in RLE Coordination Gross Motor Movements are Fluid and Coordinated: No Fine Motor Movements are Fluid and Coordinated: No Coordination and Movement Description: R & L w/ fine motor impairments, right hand w/ arthritis, paresthesias/impaired sensibility and inability to perform full fist. R UE  AROM for shoulder flexion approximately 80* at best, ~95-100* LUE. Significant crepitus bilateral UE's, R > L impacting WB and ability to assist w/ ADL's and transfers. Severe osteoarthritic changes in the glenohumeral joint right.  Motor  Motor Motor: Other (comment) (Right severe osteoarthritic changes in the glenohumeral joint.  L AKA 2012, does not ambulate or stand (x3 yrs) transfers from EOB to w/c/scooter, wide 3:1 etc.w/ squat pivot or lateral scoot.) Mobility  Bed Mobility Bed Mobility: Rolling Right;Rolling Left;Supine to Sit;Sitting - Scoot to Edge of Bed Rolling Right: 2: Max assist Rolling Right Details: Tactile cues for weight shifting;Tactile cues for placement;Verbal cues for technique Rolling Left: 1: +1 Total assist Rolling Left Details: Verbal cues for technique;Tactile cues for placement;Other (comment) (Pt unable to use R UE to assist in rolling left secondary to severe osteoarthritic changes in the glenohumeral joint. ) Supine to Sit: HOB elevated;3: Mod assist Supine to Sit Details: Manual facilitation for weight shifting;Manual facilitation for placement Supine to Sit Details (indicate cue type and reason):  (Pt reaches forward w/ LUE, faciliation from back for weight shift and lean forward.) Sitting - Scoot to Edge of Bed: 3: Mod assist Sitting - Scoot to Edge of Bed Details: Manual facilitation for weight shifting;Manual facilitation for placement Transfers Transfers: Not assessed Sit to Stand:  (Pt performed lateral scoot/squat pivot transfer from EOB to W/c w/ Mod A)  Trunk/Postural Assessment     Balance Balance Balance Assessed: Yes Static Sitting Balance Static Sitting - Balance Support: Left upper extremity supported (L LE supported) Extremity/Trunk Assessment RUE Assessment RUE Assessment: Exceptions to [facility] RUE AROM (degrees) Overall AROM Right Upper Extremity: Deficits;Due to premorbid status;Other (comment) (severe osteoarthritic changes in the  glenohumeral joint, significant crepeitus, shoulder flexion ~80* at best. Impaired fine motor coordination as well w/ h/o paresthesias and inability to fully make fist. ) LUE Assessment LUE Assessment: Exceptions to [facility] (Impaired, AROM shoulder flexion ~90-100*)  FIM:  FIM - Eating Eating Activity: 5: Set-up assist for cut food;5: Set-up assist for open containers FIM - Grooming Grooming Steps: Wash, rinse, dry face;Wash, rinse, dry hands;Oral care, brush teeth, clean dentures;Brush, comb hair Grooming: 4: Patient completes 3  of 4 or 4 of 5 steps FIM - Bathing Bathing Steps Patient Completed: Chest;Right Arm;Abdomen;Front perineal area;Right upper leg;Left upper leg Bathing: 2: Max-Patient completes 3-4 6f10 parts or 25-49% FIM - Upper Body Dressing/Undressing Upper body dressing/undressing steps patient completed: Thread/unthread right sleeve of pullover shirt/dresss;Thread/unthread left sleeve of pullover shirt/dress Upper body dressing/undressing: 2: Max-Patient completed 25-49% of tasks FIM - Lower Body Dressing/Undressing Lower body dressing/undressing steps patient completed: Thread/unthread right underwear leg;Thread/unthread left underwear leg;Thread/unthread right pants leg;Thread/unthread left pants leg Lower body dressing/undressing: 1: Total-Patient completed less than 25% of tasks FIM - Toileting Toileting: 0: Activity did not occur FIM - Bed/Chair Transfer Bed/Chair Transfer: 3: Supine > Sit: Mod A (lifting assist/Pt. 50-74%/lift 2 legs;3: Bed > Chair or W/C: Mod A (lift or lower assist) FIM - TAir cabin crewTransfers: 0-Activity did not occur FIM - Tub/Shower Transfers Tub/shower Transfers: 0-Activity did not occur or was simulated   Refer to Care Plan for Long Term Goals  Recommendations for other services: None  Discharge Criteria: Patient will be discharged from OT if patient refuses treatment 3 consecutive times without medical reason, if treatment goals  not met, if there is a change in medical status, if patient makes no progress towards goals or if patient is discharged from hospital.  The above assessment, treatment plan, treatment alternatives and goals were discussed and mutually agreed upon: by patient, no family present during assessment today.  BJosephine IgoDixon 04/22/2014, 9:29 AM

## 2014-04-22 NOTE — Progress Notes (Signed)
Physical Therapy Session Note  Patient Details  Name: [name] MRN: [id number] Date of Birth: [birth year]  Today's Date: 04/22/2014 Time: 1445-1530 Time Calculation (min): 45 min  Skilled Therapeutic Interventions/Progress Updates:   Pt received sitting in w/c, requesting need for bathroom. Therapist unable to locate wide drop arm commode for patient to use. Therefore, therapist assisted patient in sit <> stand with use of grab bar with max A while NT managed clothing and performed stand pivot transfer w/c <> toilet with +2 assist. Pt performed hygiene independently and washed hands from w/c level. Transfer training w/c <> mat table with slideboard and squat pivot transfers requiring mod-max A; will continue to assess for most appropriate transfer technique. Pt unable to adequately use UEs for transfer due to OA/pain. Sitting EOM, pt performed LE therex for strengthening/endurance: L LAQ and ankle pumps, B hip flex, x 15 each exercise. Pt education provided regarding pressure relief techniques. Pt able to report that she needed to "move around" to perform pressure relief while sitting up in w/c; reinforced pressure relief schedule and had patient demo forward and lateral weightshifts for adequate pressure relief. Pt left sitting in w/c with all needs within reach.    Therapy Documentation Precautions:  Precautions Precautions: Fall;Other (comment) (DNR) Precaution Comments: R AKA, doesn't stand or walk Restrictions Weight Bearing Restrictions: No Pain: Pain Assessment Pain Assessment: No/denies painDenied pain  See FIM for current functional status  Therapy/Group: Individual Therapy  Laretta Alstrom 04/22/2014, 3:52 PM

## 2014-04-22 NOTE — Progress Notes (Signed)
Social Work Patient ID: [name], female   DOB: [birth year], 80 y.o.   MRN: [id number] Met with pt and [name] to inform of team conference goals-mod/i transfers and min level with ADL's.  Targeted discharge date is 6/19. Aware first day here and evaluations done today, so estimation.  Will work on discharge plans and resume CAP and [place] services upon discharge.

## 2014-04-22 NOTE — Progress Notes (Signed)
Social Work Elease Hashimoto, LCSW Social Worker Signed  Patient Care Conference Service date: 04/22/2014 2:05 PM  Inpatient RehabilitationTeam Conference and Plan of Care Update Date: 04/22/2014   Time: 11:40 AM     Patient Name: [name]       Medical Record Number: [id number]   Date of Birth: 07-30-1927 Sex: Female         Room/Bed: 4W07C/4W07C-01 Payor Info: Payor: MEDICARE / Plan: MEDICARE PART A AND B / Product Type: *No Product type* /   Admitting Diagnosis: Deconditioned   Admit Date/Time:  04/21/2014  4:34 PM Admission Comments: No comment available   Primary Diagnosis:  <principal problem not specified> Principal Problem: <principal problem not specified>    Patient Active Problem List     Diagnosis  Date Noted   .  Hyperkalemia  04/21/2014   .  Sick-euthyroid syndrome  04/21/2014   .  CHF (congestive heart failure)  04/16/2014   .  C. difficile colitis  06/26/2012   .  Acute on chronic renal failure  06/24/2012   .  Pneumonia  06/23/2012   .  Acute diastolic CHF (congestive heart failure)  06/22/2012   .  SIRS (systemic inflammatory response syndrome)  06/22/2012   .  DM2 (diabetes mellitus, type 2)  06/22/2012   .  CKD (chronic kidney disease), stage IV  06/22/2012   .  HTN (hypertension)  06/22/2012   .  Breast cancer  06/22/2012   .  PVD (peripheral vascular disease)  06/22/2012   .  COPD (chronic obstructive pulmonary disease)  06/22/2012   .  S/P AKA (above knee amputation)  08/28/2011   .  Atherosclerosis of native arteries of the extremities with ulceration  07/24/2011     Expected Discharge Date: Expected Discharge Date: 05/01/14  Team Members Present: Physician leading conference: Dr. Alysia Penna Social Worker Present: Ovidio Kin, LCSW Nurse Present: Elliot Cousin, RN PT Present: Raylene Everts, PT OT Present: Clyda Greener, OT SLP Present: Gunnar Fusi, SLP PPS Coordinator present : Ileana Ladd, Lelan Pons, RN, CRRN        Current  Status/Progress  Goal  Weekly Team Focus   Medical     Deconditioning acute on chronic CHF  maintain medical stability during rehab stay monitor for fluid overload  Monitor tolerance of more intensive therapy program   Bowel/Bladder     continent of bowel and bladder, calls for assistance, LBM 6/8 colace 100 mg BID and Senokot 17.2mg  HS  remain continent with no accidents of incontinent episodes   continue to encourage continent toileting   Swallow/Nutrition/ Hydration     na       ADL's     eval pending       Mobility     eval pending       Communication     na       Safety/Cognition/ Behavioral Observations    no unsafe behaviors       Pain     mild generalized pain relieved with tylenol 650mg   pain less than 3, remain controlled with mile analgesic   assess pain q shift and prn    Skin     MASD to right abdominal folds microguard powder applied, bunion L great toe painful to touch   skin issues will be resolved and no new breakdown during rehab stay   assess skin q shift, keep folds dry and free from breakdown      *See Care Plan  and progress notes for long and short-term goals.    Barriers to Discharge:  Prior Hx AKA      Possible Resolutions to Barriers:    Cont rehab, family training prior to D/C      Discharge Planning/Teaching Needs:    Home with granddaughter and CAP worker.  Someone is always with her.  Pt plans to eventually get back to her home    Team Discussion:    New eval-doing well, checking O2 sats during therapy and at rest.  Arthritis limits her movement.  Wants to get back to baseline before going home.   Revisions to Treatment Plan:    New Eval    Continued Need for Acute Rehabilitation Level of Care: The patient requires daily medical management by a physician with specialized training in physical medicine and rehabilitation for the following conditions: Daily direction of a multidisciplinary physical rehabilitation program to ensure safe treatment  while eliciting the highest outcome that is of practical value to the patient.: Yes Daily medical management of patient stability for increased activity during participation in an intensive rehabilitation regime.: Yes Daily analysis of laboratory values and/or radiology reports with any subsequent need for medication adjustment of medical intervention for : Neurological problems;Other  Elease Hashimoto 04/22/2014, 2:05 PM          Patient ID: [name], female   DOB: 02-17-1927, 80 y.o.   MRN: [id number]

## 2014-04-22 NOTE — Progress Notes (Signed)
Social Work Assessment and Plan Social Work Assessment and Plan  Patient Details  Name: [name] MRN: [id number] Date of Birth: 16-Sep-1927  Today's Date: 04/22/2014  Problem List:  Patient Active Problem List   Diagnosis Date Noted  . Hyperkalemia 04/21/2014  . Sick-euthyroid syndrome 04/21/2014  . CHF (congestive heart failure) 04/16/2014  . C. difficile colitis 06/26/2012  . Acute on chronic renal failure 06/24/2012  . Pneumonia 06/23/2012  . Acute diastolic CHF (congestive heart failure) 06/22/2012  . SIRS (systemic inflammatory response syndrome) 06/22/2012  . DM2 (diabetes mellitus, type 2) 06/22/2012  . CKD (chronic kidney disease), stage IV 06/22/2012  . HTN (hypertension) 06/22/2012  . Breast cancer 06/22/2012  . PVD (peripheral vascular disease) 06/22/2012  . COPD (chronic obstructive pulmonary disease) 06/22/2012  . S/P AKA (above knee amputation) 08/28/2011  . Atherosclerosis of native arteries of the extremities with ulceration 07/24/2011   Past Medical History:  Past Medical History  Diagnosis Date  . DM (diabetes mellitus) type II controlled peripheral vascular disorder     A1C = 5.8  . HTN (hypertension)   . CHF (congestive heart failure)     EF = 02-63%, grade 2 diastolic dysfunction  . CAD (coronary artery disease)   . COPD (chronic obstructive pulmonary disease)   . Cataracts, bilateral   . Hyperlipidemia   . Peripheral vascular disease bilateral    S/P femoral-popliteal bypass surgery  . Glaucoma   . TIA (transient ischemic attack)   . Shortness of breath   . Pneumonia     HX OF PNA  . Depression   . GERD (gastroesophageal reflux disease)   . Arthritis     RA  . Anemia   . CKD (chronic kidney disease)    Past Surgical History:  Past Surgical History  Procedure Laterality Date  . Coronary artery bypass graft    . Spine surgery    . Eye surgery  cataract  . Carpal tunnel release  left  . Breast surgery  lumpectomy right  . Aortogram  w/ runoff  06/06/11    right leg  . Above knee leg amputation  07/27/11    Right AKA   Social History:  reports that she quit smoking about 37 years ago. Her smoking use included Cigarettes. She has a 40 pack-year smoking history. She has never used smokeless tobacco. She reports that she does not drink alcohol or use illicit drugs.  Family / Support Systems Marital Status: Widow/Widower Patient Roles: Parent Children: [name]  [phone] Other Supports: [name]  [phone]  [phone]    [name]  [phone] Anticipated Caregiver: [name] and CAP worker Ability/Limitations of Caregiver: [name] works three days 12 hours, but has CAP Insurance underwriter and great grandson stays with when [name] Caregiver Availability: 24/7 Family Dynamics: Close knit family who are there for one another, have always been.  Have assisted with pt's care since 2012 when she had AKA.  They feel family take care of family.  Pt would have done the same thing if it were her.  She reports: " I am gratful to have them."  Social History Preferred language: English Religion: Baptist Cultural Background: No issues Education: [employer] Read: Yes Write: Yes Employment Status: Retired Freight forwarder Issues: No issues Guardian/Conservator: None-according to MD pt is capable of making her own decisions while here.  Family very involved and will assist with    Abuse/Neglect Physical Abuse: Denies Verbal Abuse: Denies Sexual Abuse: Denies Exploitation of patient/patient's resources:  Denies Self-Neglect: Denies  Emotional Status Pt's affect, behavior adn adjustment status: Pt has always relied upon herself and her ability to adapt to any situation.  She wants to remain as independent as possible, not wanting to burden others or put them out.  She prides herself on doing what she can for herself.  She has doen well in the past she is hopeful  again. Recent Psychosocial Issues: Other medical issues and limitations Pyschiatric History: History of depression takes medicine and finds this helpful.  She feels the depression screen is not necessary at this time, she feels she is doing better and on the road to recovery.  She is feeling much better physically which is hleping her.  Will monitor while here and intervene if necessary Substance Abuse History: No issues  Patient / Family Perceptions, Expectations & Goals Pt/Family understanding of illness & functional limitations: Pt and granddaughter are able to explain pt's condition and reason for hospitalization.  Granddaughter is a Therapist, sports here and has been caring for pt since 2012.  Both have a good understanding of her hospitialization and pleased with how well she is doing now, just needs to build her strength back. Premorbid pt/family roles/activities: Grandmother, Mother, Retiree, [facility] member, etc Anticipated changes in roles/activities/participation: resume Pt/family expectations/goals: Pt states; " I want ot get back to the level I was at before this."  [name] states; " I hope she does well here but we will do whatever is necessary to assist her."  [employer]: Other (Comment) (Has CAP services) Premorbid Home Care/DME Agencies: Other (Comment) (Active [facility] patient) Transportation available at discharge: Family Resource referrals recommended: Support group (specify) (Amputee Support group)  Discharge Planning Living Arrangements: Other relatives Support Systems: Children;Other relatives;Friends/neighbors;Church/faith community Type of Residence: Private residence Insurance Resources: Medicare;Medicaid (specify county) ([place]) Museum/gallery curator Resources: Radio broadcast assistant Screen Referred: No Living Expenses: Lives with family Money Management: Family Does the patient have any problems obtaining your medications?: No Home Management: Family  members Patient/Family Preliminary Plans: Return home with [name] and hopefully eventually back home, to her apartment.  Main goal is to get back to her baseline level of functioning so she can manage at home with CAp and family member.  She should do well here and be a short length of stay. Social Work Anticipated Follow Up Needs: HH/OP;Support Group;Other (comment) (resume CAP services)  Clinical Impression Pleasant female who is well known to rehab and aware of the process.  She wants to do well and return home soon.  Her family is very committed to her and her care and are here daily To provide emotional support.  Regaining her strength is the main focus while here.  Should do well here and go home soon.  Elease Hashimoto 04/22/2014, 2:57 PM

## 2014-04-22 NOTE — Progress Notes (Signed)
80 y.o. right-handed female with history of diabetes mellitus with peripheral neuropathy, diastolic congestive heart failure, CAD, hypertension and chronic renal insufficiency with creatinine baseline 2.02 and history of right AKA September 2012 and received inpatient rehabilitation services and prosthesis provided by [employer] prosthetics. Patient primarily wheelchair bound since January and limited use of prosthesis and assistance of her granddaughter and hired caregivers. Presented 04/16/2014 with complaints of worsening shortness of breath for 2 days as well as nonproductive cough. Noted hypoxemia of 88% in the ED was placed on 3 L oxygen. Chest x-ray showed pulmonary edema small bilateral pleural effusions. She was administered intravenous Lasix. Noted hyperkalemia 5.7 received Kayexalate. Complaints of right shoulder pain with x-rays negative for fracture but did show severe osteoarthritic changes in the glenohumeral joint  Subjective/Complaints: No new c/os Chronic shoulder pain  Review of Systems - Negative except joint pain  Objective: Vital Signs: Blood pressure 146/49, pulse 61, temperature 98.7 F (37.1 C), temperature source Oral, resp. rate 18, height 5' 2"  (1.575 m), weight 84.2 kg (185 lb 10 oz), SpO2 95.00%. No results found. Results for orders placed during the hospital encounter of 04/21/14 (from the past 72 hour(s))  CBC     Status: Abnormal   Collection Time    04/21/14  4:59 PM      Result Value Ref Range   WBC 8.1  4.0 - 10.5 K/uL   RBC 3.98  3.87 - 5.11 MIL/uL   Hemoglobin 9.1 (*) 12.0 - 15.0 g/dL   HCT 28.4 (*) 36.0 - 46.0 %   MCV 71.4 (*) 78.0 - 100.0 fL   MCH 22.9 (*) 26.0 - 34.0 pg   MCHC 32.0  30.0 - 36.0 g/dL   RDW 17.7 (*) 11.5 - 15.5 %   Platelets 230  150 - 400 K/uL  CREATININE, SERUM     Status: Abnormal   Collection Time    04/21/14  4:59 PM      Result Value Ref Range   Creatinine, Ser 2.05 (*) 0.50 - 1.10 mg/dL   GFR calc non Af Amer 21 (*) >90  mL/min   GFR calc Af Amer 24 (*) >90 mL/min   Comment: (NOTE)     The eGFR has been calculated using the CKD EPI equation.     This calculation has not been validated in all clinical situations.     eGFR's persistently <90 mL/min signify possible Chronic Kidney     Disease.  GLUCOSE, CAPILLARY     Status: Abnormal   Collection Time    04/21/14  9:16 PM      Result Value Ref Range   Glucose-Capillary 122 (*) 70 - 99 mg/dL  GLUCOSE, CAPILLARY     Status: None   Collection Time    04/22/14 12:01 AM      Result Value Ref Range   Glucose-Capillary 98  70 - 99 mg/dL  GLUCOSE, CAPILLARY     Status: None   Collection Time    04/22/14  4:12 AM      Result Value Ref Range   Glucose-Capillary 96  70 - 99 mg/dL  GLUCOSE, CAPILLARY     Status: Abnormal   Collection Time    04/22/14  7:53 AM      Result Value Ref Range   Glucose-Capillary 102 (*) 70 - 99 mg/dL     HEENT: normal Cardio: RRR and no murmur Resp: CTA B/L and unlabored GI: BS positive and non distended Extremity:  Pulses positive and Edema R  AKA distal Skin:   Intact Neuro: Alert/Oriented and Abnormal Motor 3-/5 Bilateral deltoid, 5/5 Biceps and trips and grip, 4/5 HF, Left KE and Ankle Musc/Skel:  Other crepitus bilateral shoulder Gen NAD, Morbidly obese   Assessment/Plan: 1. Functional deficits secondary to Deconditioning after exacerbation of CHF which require 3+ hours per day of interdisciplinary therapy in a comprehensive inpatient rehab setting. Physiatrist is providing close team supervision and 24 hour management of active medical problems listed below. Physiatrist and rehab team continue to assess barriers to discharge/monitor patient progress toward functional and medical goals. FIM:                                  Medical Problem List and Plan:  1. Functional deficits secondary to Multi-medical/Deconditioning/history right AKA September 2012  2. DVT Prophylaxis/Anticoagulation:  Subcutaneous Lovenox. Monitor platelet counts any signs of bleeding  3. Pain Management: Tylenol as needed  4. Mood/depression: Celexa 10 mg daily. Provide emotional support  5. Neuropsych: This patient is capable of making decisions on her own behalf.  6. Diastolic congestive heart failure. Lasix 60 mg twice a day. Monitor for any signs of fluid overload  7. Hypertension. Norvasc 10 mg daily, Coreg 3.125 mg twice a day, hydralazine 100 mg 3 times a day, Imdur 120 mg daily. Monitor with increased mobility  8. Diabetes mellitus with peripheral neuropathy. Hemoglobin A1c 6.4. Continue sliding scale insulin. Check blood sugars a.c. and at bedtime. Patient diet controlled prior to admission.  9. Chronic renal insufficiency. Baseline creatinine 2.02. Followup chemistries  10. Chronic anemia. Continue Niferex. Followup CBC  11. Hyperlipidemia. Zetia  12. History of gout. Zyloprim. Monitor for any gout flare ups  13. GERD. Pepcid  14. COPD. Singulair 10 mg daily    LOS (Days) 1 A FACE TO FACE EVALUATION WAS PERFORMED  KIRSTEINS,ANDREW E 04/22/2014, 8:06 AM

## 2014-04-22 NOTE — Patient Care Conference (Signed)
Inpatient RehabilitationTeam Conference and Plan of Care Update Date: 04/22/2014   Time: 11:40 AM    Patient Name: [name]      Medical Record Number: [id number]  Date of Birth: April 23, 1927 Sex: Female         Room/Bed: 4W07C/4W07C-01 Payor Info: Payor: MEDICARE / Plan: MEDICARE PART A AND B / Product Type: *No Product type* /    Admitting Diagnosis: Deconditioned   Admit Date/Time:  04/21/2014  4:34 PM Admission Comments: No comment available   Primary Diagnosis:  <principal problem not specified> Principal Problem: <principal problem not specified>  Patient Active Problem List   Diagnosis Date Noted  . Hyperkalemia 04/21/2014  . Sick-euthyroid syndrome 04/21/2014  . CHF (congestive heart failure) 04/16/2014  . C. difficile colitis 06/26/2012  . Acute on chronic renal failure 06/24/2012  . Pneumonia 06/23/2012  . Acute diastolic CHF (congestive heart failure) 06/22/2012  . SIRS (systemic inflammatory response syndrome) 06/22/2012  . DM2 (diabetes mellitus, type 2) 06/22/2012  . CKD (chronic kidney disease), stage IV 06/22/2012  . HTN (hypertension) 06/22/2012  . Breast cancer 06/22/2012  . PVD (peripheral vascular disease) 06/22/2012  . COPD (chronic obstructive pulmonary disease) 06/22/2012  . S/P AKA (above knee amputation) 08/28/2011  . Atherosclerosis of native arteries of the extremities with ulceration 07/24/2011    Expected Discharge Date: Expected Discharge Date: 05/01/14  Team Members Present: Physician leading conference: Dr. Alysia Penna Social Worker Present: Ovidio Kin, LCSW Nurse Present: Elliot Cousin, RN PT Present: Raylene Everts, PT OT Present: Clyda Greener, OT SLP Present: Gunnar Fusi, SLP PPS Coordinator present : Ileana Ladd, Lelan Pons, RN, CRRN     Current Status/Progress Goal Weekly Team Focus  Medical   Deconditioning acute on chronic CHF  maintain medical stability during rehab stay monitor for fluid overload  Monitor  tolerance of more intensive therapy program   Bowel/Bladder   continent of bowel and bladder, calls for assistance, LBM 6/8 colace 100 mg BID and Senokot 17.2mg  HS  remain continent with no accidents of incontinent episodes   continue to encourage continent toileting   Swallow/Nutrition/ Hydration     na        ADL's     eval pending        Mobility     eval pending        Communication     na        Safety/Cognition/ Behavioral Observations    no unsafe behaviors        Pain   mild generalized pain relieved with tylenol 650mg   pain less than 3, remain controlled with mile analgesic   assess pain q shift and prn    Skin   MASD to right abdominal folds microguard powder applied, bunion L great toe painful to touch   skin issues will be resolved and no new breakdown during rehab stay   assess skin q shift, keep folds dry and free from breakdown       *See Care Plan and progress notes for long and short-term goals.  Barriers to Discharge: Prior Hx AKA    Possible Resolutions to Barriers:  Cont rehab, family training prior to D/C    Discharge Planning/Teaching Needs:    Home with granddaughter and CAP worker.  Someone is always with her.  Pt plans to eventually get back to her home     Team Discussion:  New eval-doing well, checking O2 sats during therapy and at rest.  Arthritis limits her  movement.  Wants to get back to baseline before going home.  Revisions to Treatment Plan:  New Eval   Continued Need for Acute Rehabilitation Level of Care: The patient requires daily medical management by a physician with specialized training in physical medicine and rehabilitation for the following conditions: Daily direction of a multidisciplinary physical rehabilitation program to ensure safe treatment while eliciting the highest outcome that is of practical value to the patient.: Yes Daily medical management of patient stability for increased activity during participation in an  intensive rehabilitation regime.: Yes Daily analysis of laboratory values and/or radiology reports with any subsequent need for medication adjustment of medical intervention for : Neurological problems;Other  Iceis Knab, Gardiner Rhyme 04/22/2014, 2:05 PM

## 2014-04-22 NOTE — Progress Notes (Signed)
Patient information reviewed and entered into eRehab system by Dejuana Weist, RN, CRRN, PPS Coordinator.  Information including medical coding and functional independence measure will be reviewed and updated through discharge.     Per nursing patient was given "Data Collection Information Summary for Patients in Inpatient Rehabilitation Facilities with attached "Privacy Act Statement-Health Care Records" upon admission.  

## 2014-04-22 NOTE — Care Management Note (Signed)
Inpatient [facility] Individual Statement of Services  Patient Name:  [name]  Date:  04/22/2014  Welcome to the [facility].  Our goal is to provide you with an individualized program based on your diagnosis and situation, designed to meet your specific needs.  With this comprehensive rehabilitation program, you will be expected to participate in at least 3 hours of rehabilitation therapies Monday-Friday, with modified therapy programming on the weekends.  Your rehabilitation program will include the following services:  Physical Therapy (PT), Occupational Therapy (OT), 24 hour per day rehabilitation nursing, Case Management (Social Worker), Rehabilitation Medicine, Nutrition Services and Pharmacy Services  Weekly team conferences will be held on Wednesday to discuss your progress.  Your Social Worker will talk with you frequently to get your input and to update you on team discussions.  Team conferences with you and your family in attendance may also be held.  Expected length of stay: 10-14 days  Overall anticipated outcome: supervision/min level  Depending on your progress and recovery, your program may change. Your Social Worker will coordinate services and will keep you informed of any changes. Your Social Worker's name and contact numbers are listed  below.  The following services may also be recommended but are not provided by the [facility]:  [place] will be made to provide these services after discharge if needed.  Arrangements include referral to agencies that provide these services.  Your insurance has been verified to be:  Medicare & Medicaid Your primary doctor is:  Dr Gaynelle Arabian  Pertinent information will be shared with your doctor and your insurance company.  Social Worker:  Ovidio Kin, [place] or [phone]  Information discussed with and copy given to patient by: Elease Hashimoto, 04/22/2014, 9:26 AM

## 2014-04-22 NOTE — Progress Notes (Signed)
Occupational Therapy Session Note  Patient Details  Name: KATALEYAH CARDUCCI MRN: [id number] Date of Birth: [birth year]  Today's Date: 04/22/2014 Time: 1331-1400 Time Calculation (min): 29 min  Skilled Therapeutic Interventions/Progress Updates:    Pt performed bed to mat transfers with mod assist using the sliding board.  She needs total assist for positioning of the board for pt to scoot across.  Max demonstrational cueing for small scoots with emphasis on forward weightshift transition in order to utilize the LLE better to assist with the transfer.  Pt only able to take small scoots on the mat with max assist to work on this secondary to increased friction.      Therapy Documentation Precautions:  Precautions Precautions: Fall;Other (comment) (DNR) Precaution Comments: R AKA, doesn't stand or walk Restrictions Weight Bearing Restrictions: No  Pain: Pain Assessment Pain Assessment: No/denies pain ADL: See FIM for current functional status  Therapy/Group: Individual Therapy  Courtlyn Aki OTR/L 04/22/2014, 3:32 PM

## 2014-04-23 ENCOUNTER — Inpatient Hospital Stay (HOSPITAL_COMMUNITY): Payer: Medicare Other | Admitting: Rehabilitation

## 2014-04-23 ENCOUNTER — Encounter (HOSPITAL_COMMUNITY): Payer: Medicare Other

## 2014-04-23 LAB — GLUCOSE, CAPILLARY
GLUCOSE-CAPILLARY: 94 mg/dL (ref 70–99)
GLUCOSE-CAPILLARY: 147 mg/dL — AB (ref 70–99)
Glucose-Capillary: 108 mg/dL — ABNORMAL HIGH (ref 70–99)
Glucose-Capillary: 115 mg/dL — ABNORMAL HIGH (ref 70–99)
Glucose-Capillary: 122 mg/dL — ABNORMAL HIGH (ref 70–99)
Glucose-Capillary: 137 mg/dL — ABNORMAL HIGH (ref 70–99)

## 2014-04-23 MED ORDER — INSULIN ASPART 100 UNIT/ML ~~LOC~~ SOLN: 0.0000 [IU] | Freq: Three times a day (TID) | SUBCUTANEOUS | Status: DC

## 2014-04-23 MED ORDER — INSULIN ASPART 100 UNIT/ML ~~LOC~~ SOLN
0.0000 [IU] | Freq: Three times a day (TID) | SUBCUTANEOUS | Status: DC
  Administered 2014-04-24: 2 [IU] via SUBCUTANEOUS
  Administered 2014-04-24 – 2014-04-25 (×3): 1 [IU] via SUBCUTANEOUS
  Administered 2014-04-26: 2 [IU] via SUBCUTANEOUS
  Administered 2014-04-26 (×2): 1 [IU] via SUBCUTANEOUS
  Administered 2014-04-27: 2 [IU] via SUBCUTANEOUS
  Administered 2014-04-28 (×2): 1 [IU] via SUBCUTANEOUS
  Administered 2014-04-28: 2 [IU] via SUBCUTANEOUS
  Administered 2014-04-29 – 2014-04-30 (×4): 1 [IU] via SUBCUTANEOUS
  Administered 2014-04-30: 2 [IU] via SUBCUTANEOUS

## 2014-04-23 NOTE — IPOC Note (Signed)
Overall Plan of Care [facility]) Patient Details Name: [name] MRN: [id number] DOB: Jan 30, 1927  Admitting Diagnosis: Deconditioned   Hospital Problems: Active Problems:   S/P AKA (above knee amputation)     Functional Problem List: Nursing Bladder;Bowel;Endurance;Medication Management;Motor;Nutrition;Safety;Pain;Sensory;Skin Integrity  PT Endurance;Motor;Pain;Safety;Sensory;Balance  OT Balance;Endurance;Motor;Safety;Sensory  SLP    TR         Basic ADL's: OT Eating;Grooming;Bathing;Dressing;Toileting     Advanced  ADL's: OT Simple Meal Preparation (Simple snack prep from w/c level)     Transfers: PT Bed Mobility;Bed to Chair;Car;Furniture  OT Toilet;Tub/Shower     Locomotion: PT Wheelchair Mobility (non-ambulatory)     Additional Impairments: OT Fuctional Use of Upper Extremity  SLP        TR      Anticipated Outcomes Item Anticipated Outcome  Self Feeding Mod I w/set up w/ A/E PRN  Swallowing      Basic self-care  Supervision-Min A w/ set up & A/E PRN  Toileting  Min Assist w/ A/E PRN   Bathroom Transfers Min Assist squat pivot/lateral scoot transfer w/c-cahir/wide drop arm 3:1  Bowel/Bladder  MOD I   Transfers  supervision-minA  Locomotion  pt anticipated to be w/c level at discharge, limited ability to propel w/c due to BUE OA and limited ROM/pain  Communication     Cognition     Pain  Pain level less than 2/10 pain scale  Safety/Judgment  Remain safe while admitted on the rehab unit   Therapy Plan: PT Intensity: Minimum of 1-2 x/day ,45 to 90 minutes PT Frequency: 5 out of 7 days PT Duration Estimated Length of Stay: 10-14 days OT Intensity: Minimum of 1-2 x/day, 45 to 90 minutes OT Frequency: Total of 15 hours over 7 days OT Duration/Estimated Length of Stay: 10-14 days         Team Interventions: Nursing Interventions Patient/Family Education;Bladder Management;Bowel Management;Disease Management/Prevention;Pain Management;Medication  Management;Skin Care/Wound Management;Discharge Planning  PT interventions Balance/vestibular training;DME/adaptive equipment instruction;Functional mobility training;Neuromuscular re-education;Pain management;Patient/family education;Therapeutic Activities;Therapeutic Exercise;UE/LE Strength taining/ROM;UE/LE Coordination activities;Wheelchair propulsion/positioning  OT Interventions Balance/vestibular training;Discharge planning;Functional mobility training;DME/adaptive equipment instruction;Patient/family education;Therapeutic Activities;Therapeutic Exercise;Self Care/advanced ADL retraining;UE/LE Strength taining/ROM;UE/LE Coordination activities;Wheelchair propulsion/positioning  SLP Interventions    TR Interventions    SW/CM Interventions Discharge Planning;Psychosocial Support;Patient/Family Education    Team Discharge Planning: Destination: PT-Home ,OT- Home , SLP-  Projected Follow-up: PT-Home health PT;24 hour supervision/assistance, OT-  Home health OT;24 hour supervision/assistance, SLP-  Projected Equipment Needs: PT- , OT- To be determined, SLP-  Equipment Details: PT-pt has scooter and BSC, reports having manual w/c that is too small and old walker, OT-  Patient/family involved in discharge planning: PT- Patient,  OT-Patient, SLP-   MD ELOS: 10-14 days Medical Rehab Prognosis:  Good Assessment: 80 y.o. right-handed female with history of diabetes mellitus with peripheral neuropathy, diastolic congestive heart failure, CAD, hypertension and chronic renal insufficiency with creatinine baseline 2.02 and history of right AKA September 2012 and received inpatient rehabilitation services and prosthesis provided by [employer] prost[facility]. Patient primarily wheelchair bound since January and limited use of prosthesis and assistance of her granddaughter and hired caregivers. Presented 04/16/2014 with complaints of worsening shortness of breath for 2 days as well as nonproductive cough. Noted  hypoxemia of 88% in the ED was placed on 3 L oxygen.   Now requiring 24/7 Rehab RN,MD, as well as CIR level PT, OT   Treatment team will focus on ADLs and mobility with goals set at [facility]  See Team Conference Notes for weekly updates  to the plan of care

## 2014-04-23 NOTE — Progress Notes (Signed)
Occupational Therapy Session Note  Patient Details  Name: [name] MRN: [id number] Date of Birth: 1927/01/11  Today's Date: 04/23/2014 Time: 1000-1055 Time Calculation (min): 55 min  Short Term Goals: Week 1:  OT Short Term Goal 1 (Week 1): UB bathing/dressing w/ Min A seated  OT Short Term Goal 1 - Progress (Week 1): Progressing toward goal OT Short Term Goal 2 (Week 1): LB bathe/dress Mod A w/ A/E PRN OT Short Term Goal 2 - Progress (Week 1): Progressing toward goal OT Short Term Goal 3 (Week 1): Toilet transfer w/ Min A w/c to drop arm 3:1 OT Short Term Goal 3 - Progress (Week 1): Progressing toward goal OT Short Term Goal 4 (Week 1): Pt will be Min A w/ A/E use during ADL's and compensatory techniques secondary to decreased functional use/arthritis bilateral UE's (R>L). OT Short Term Goal 4 - Progress (Week 1): Progressing toward goal OT Short Term Goal 5 (Week 1): Lateral scoot/squat pivot transfers Min A OT Short Term Goal 5 - Progress (Week 1): Progressing toward goal  Skilled Therapeutic Interventions/Progress Updates:    Pt sitting win w/c upon arrival.  Pt declined bathing and dressing this morning stating that she did not want a female assisting with BADLs.  Pt transitioned to therapy gym and practiced transfers w/c<>mat.  Pt stated she only used sliding board at home to get in and out of car. Pt practiced scoot transfer w/c<>mat X 2 requiring mod A and max verbal cues for positioning.  Pt engaged in BUE exercises while seated unsupported on mat.  Pt used 3# weight bar and 1# dumb bells.  Pt also used pushup blocks to perform mat pushups (10 X 3).  Pt required multiple rest breaks throughout session.  Focus on transfers, sitting balance, and BUE exercises to increase UE strength, activity tolerance, and independence with BADLs.  Therapy Documentation Precautions:  Precautions Precautions: Fall;Other (comment) (DNR) Precaution Comments: R AKA, doesn't stand or  walk Restrictions Weight Bearing Restrictions: No General:   Vital Signs: Therapy Vitals Pulse Rate: 69 BP: 152/72 mmHg Pain: Pain Assessment Pain Assessment: No/denies pain  See FIM for current functional status  Therapy/Group: Individual Therapy  Leroy Libman 04/23/2014, 10:59 AM

## 2014-04-23 NOTE — Progress Notes (Signed)
Physical Therapy Session Note  Patient Details  Name: [name] MRN: [id number] Date of Birth: 17-Feb-1927  Today's Date: 04/23/2014 Time: 0930-1000 and 1100-1158 Time Calculation (min): 30 min and 58 mins  Short Term Goals: Week 1:  PT Short Term Goal 1 (Week 1): = LTGS due to anticipated LOS  Skilled Therapeutic Interventions/Progress Updates:   First AM session:  Pt received lying in bed, agreeable to therapy.  Pt getting meds from RN.  Note pt states increased pain in L great toe area.  Also note area of pressure where bunion is on L foot.  Donned Alyvan dressing for protection (okayed per RN).  Assisted pt to EOB with max assist with HOB flat but with use of rails, as pt states she will have hospital bed at home.  Requires assist for shifting hips closer to EOB and also to elevate trunk into sitting.  Skilled session focused on functional transfers with sliding board and also working on w/c mobility.  Performed slideboard transfer with mod assist.  Good use of forward weight shift and UEs to push into chair.  Also good use of WB through LLE during transfer.  Performed approx 40' w/c mobiltiy with BUEs and LLE.  Cues for continued use of LLE to propel, however requires mod assist at times due to decreased shoulder function.  Assisted back to room and left in w/c with all needs in reach.   Second AM session:  Pt received sitting in w/c in room, agreeable to session.  As per pt request earlier in day, skilled session focused on bathing/dressing while seated in w/c with back unsupported to further challenge balance and trunk control during task.  Pt states that she received assist at home with some lower body and upper body bathing and dressing PTA.  Pt able to wash/dry face, R UE, LLE (upper leg), trunk and under axilla.  Therapist assisted with remainder of bathing.  She was able to perform forward trunk lean with UE supported and WB through LLE to elevate buttocks to assist with cleaning and  pulling up underwear and pants.  Pt also has specific way that she cleaned peri area, therefore pulled pt up to bed side so LLE could be supported while performing peri cleaning.  Remainder of session focused on functional transfers with and without slideboard at mod assist level and supine therex for LLE/R residual limb with hip abd x 10 reps, SLR x 10 reps BLE.  Pt returned to w/c and assisted back to room.  Left in w/c with all needs in reach and RN notified of request for pain meds.    Therapy Documentation Precautions:  Precautions Precautions: Fall;Other (comment) (DNR) Precaution Comments: R AKA, doesn't stand or walk Restrictions Weight Bearing Restrictions: No   Vital Signs: Therapy Vitals Pulse Rate: 69 BP: 152/72 mmHg Pain: Pain Assessment Pain Assessment: No/denies pain   Locomotion : Wheelchair Mobility Distance: 40   See FIM for current functional status  Therapy/Group: Individual Therapy  Denice Bors 04/23/2014, 12:20 PM

## 2014-04-23 NOTE — Progress Notes (Signed)
80 y.o. right-handed female with history of diabetes mellitus with peripheral neuropathy, diastolic congestive heart failure, CAD, hypertension and chronic renal insufficiency with creatinine baseline 2.02 and history of right AKA September 2012 and received inpatient rehabilitation services and prosthesis provided by [employer] [facility]. Patient primarily wheelchair bound since January and limited use of prosthesis and assistance of her granddaughter and hired caregivers. Presented 04/16/2014 with complaints of worsening shortness of breath for 2 days as well as nonproductive cough. Noted hypoxemia of 88% in the ED was placed on 3 L oxygen. Chest x-ray showed pulmonary edema small bilateral pleural effusions. She was administered intravenous Lasix. Noted hyperkalemia 5.7 received Kayexalate. Complaints of right shoulder pain with x-rays negative for fracture but did show severe osteoarthritic changes in the glenohumeral joint  Subjective/Complaints: Sinus congestion and drainage, pt states she's had this problem in the past,has been on claritin, no frontal HA  Review of Systems - Negative except joint pain and as above  Objective: Vital Signs: Blood pressure 156/75, pulse 70, temperature 98.9 F (37.2 C), temperature source Oral, resp. rate 16, height $RemoveBe'5\' 2"'UTeolClBs$  (1.575 m), weight 80.939 kg (178 lb 7 oz), SpO2 94.00%. No results found. Results for orders placed during the hospital encounter of 04/21/14 (from the past 72 hour(s))  CBC     Status: Abnormal   Collection Time    04/21/14  4:59 PM      Result Value Ref Range   WBC 8.1  4.0 - 10.5 K/uL   RBC 3.98  3.87 - 5.11 MIL/uL   Hemoglobin 9.1 (*) 12.0 - 15.0 g/dL   HCT 28.4 (*) 36.0 - 46.0 %   MCV 71.4 (*) 78.0 - 100.0 fL   MCH 22.9 (*) 26.0 - 34.0 pg   MCHC 32.0  30.0 - 36.0 g/dL   RDW 17.7 (*) 11.5 - 15.5 %   Platelets 230  150 - 400 K/uL  CREATININE, SERUM     Status: Abnormal   Collection Time    04/21/14  4:59 PM      Result Value Ref  Range   Creatinine, Ser 2.05 (*) 0.50 - 1.10 mg/dL   GFR calc non Af Amer 21 (*) >90 mL/min   GFR calc Af Amer 24 (*) >90 mL/min   Comment: (NOTE)     The eGFR has been calculated using the CKD EPI equation.     This calculation has not been validated in all clinical situations.     eGFR's persistently <90 mL/min signify possible Chronic Kidney     Disease.  GLUCOSE, CAPILLARY     Status: Abnormal   Collection Time    04/21/14  9:16 PM      Result Value Ref Range   Glucose-Capillary 122 (*) 70 - 99 mg/dL  GLUCOSE, CAPILLARY     Status: None   Collection Time    04/22/14 12:01 AM      Result Value Ref Range   Glucose-Capillary 98  70 - 99 mg/dL  GLUCOSE, CAPILLARY     Status: None   Collection Time    04/22/14  4:12 AM      Result Value Ref Range   Glucose-Capillary 96  70 - 99 mg/dL  GLUCOSE, CAPILLARY     Status: Abnormal   Collection Time    04/22/14  7:53 AM      Result Value Ref Range   Glucose-Capillary 102 (*) 70 - 99 mg/dL  CBC WITH DIFFERENTIAL     Status: Abnormal   Collection  Time    04/22/14  9:35 AM      Result Value Ref Range   WBC 9.0  4.0 - 10.5 K/uL   RBC 4.22  3.87 - 5.11 MIL/uL   Hemoglobin 9.6 (*) 12.0 - 15.0 g/dL   HCT 30.6 (*) 36.0 - 46.0 %   MCV 72.5 (*) 78.0 - 100.0 fL   MCH 22.7 (*) 26.0 - 34.0 pg   MCHC 31.4  30.0 - 36.0 g/dL   RDW 17.6 (*) 11.5 - 15.5 %   Platelets 267  150 - 400 K/uL   Neutrophils Relative % 76  43 - 77 %   Neutro Abs 6.8  1.7 - 7.7 K/uL   Lymphocytes Relative 15  12 - 46 %   Lymphs Abs 1.4  0.7 - 4.0 K/uL   Monocytes Relative 6  3 - 12 %   Monocytes Absolute 0.5  0.1 - 1.0 K/uL   Eosinophils Relative 3  0 - 5 %   Eosinophils Absolute 0.3  0.0 - 0.7 K/uL   Basophils Relative 0  0 - 1 %   Basophils Absolute 0.0  0.0 - 0.1 K/uL  COMPREHENSIVE METABOLIC PANEL     Status: Abnormal   Collection Time    04/22/14  9:35 AM      Result Value Ref Range   Sodium 144  137 - 147 mEq/L   Potassium 4.4  3.7 - 5.3 mEq/L   Chloride  102  96 - 112 mEq/L   CO2 26  19 - 32 mEq/L   Glucose, Bld 161 (*) 70 - 99 mg/dL   BUN 75 (*) 6 - 23 mg/dL   Creatinine, Ser 2.01 (*) 0.50 - 1.10 mg/dL   Calcium 9.4  8.4 - 10.5 mg/dL   Total Protein 7.1  6.0 - 8.3 g/dL   Albumin 3.3 (*) 3.5 - 5.2 g/dL   AST 21  0 - 37 U/L   ALT 13  0 - 35 U/L   Alkaline Phosphatase 104  39 - 117 U/L   Total Bilirubin 0.2 (*) 0.3 - 1.2 mg/dL   GFR calc non Af Amer 21 (*) >90 mL/min   GFR calc Af Amer 25 (*) >90 mL/min   Comment: (NOTE)     The eGFR has been calculated using the CKD EPI equation.     This calculation has not been validated in all clinical situations.     eGFR's persistently <90 mL/min signify possible Chronic Kidney     Disease.  GLUCOSE, CAPILLARY     Status: Abnormal   Collection Time    04/22/14 12:06 PM      Result Value Ref Range   Glucose-Capillary 155 (*) 70 - 99 mg/dL   Comment 1 Notify RN    GLUCOSE, CAPILLARY     Status: Abnormal   Collection Time    04/22/14  3:54 PM      Result Value Ref Range   Glucose-Capillary 142 (*) 70 - 99 mg/dL  GLUCOSE, CAPILLARY     Status: Abnormal   Collection Time    04/22/14  8:29 PM      Result Value Ref Range   Glucose-Capillary 129 (*) 70 - 99 mg/dL  GLUCOSE, CAPILLARY     Status: Abnormal   Collection Time    04/22/14 11:58 PM      Result Value Ref Range   Glucose-Capillary 122 (*) 70 - 99 mg/dL   Comment 1 Notify RN    GLUCOSE, CAPILLARY  Status: None   Collection Time    04/23/14  4:11 AM      Result Value Ref Range   Glucose-Capillary 94  70 - 99 mg/dL  GLUCOSE, CAPILLARY     Status: Abnormal   Collection Time    04/23/14  7:54 AM      Result Value Ref Range   Glucose-Capillary 108 (*) 70 - 99 mg/dL   Comment 1 Notify RN       HEENT: normal Cardio: RRR and no murmur Resp: CTA B/L and unlabored GI: BS positive and non distended Extremity:  Pulses positive and Edema R AKA distal Skin:   Intact Neuro: Alert/Oriented and Abnormal Motor 3-/5 Bilateral deltoid,  5/5 Biceps and trips and grip, 4/5 HF, Left KE and Ankle Musc/Skel:  Other crepitus bilateral shoulder Gen NAD, Morbidly obese   Assessment/Plan: 1. Functional deficits secondary to Deconditioning after exacerbation of CHF which require 3+ hours per day of interdisciplinary therapy in a comprehensive inpatient rehab setting. Physiatrist is providing close team supervision and 24 hour management of active medical problems listed below. Physiatrist and rehab team continue to assess barriers to discharge/monitor patient progress toward functional and medical goals. FIM: FIM - Bathing Bathing Steps Patient Completed: Chest;Right Arm;Abdomen;Front perineal area;Right upper leg;Left upper leg Bathing: 2: Max-Patient completes 3-4 14f 10 parts or 25-49%  FIM - Upper Body Dressing/Undressing Upper body dressing/undressing steps patient completed: Thread/unthread right sleeve of pullover shirt/dresss;Thread/unthread left sleeve of pullover shirt/dress Upper body dressing/undressing: 2: Max-Patient completed 25-49% of tasks FIM - Lower Body Dressing/Undressing Lower body dressing/undressing steps patient completed: Thread/unthread right underwear leg;Thread/unthread left underwear leg;Thread/unthread right pants leg;Thread/unthread left pants leg Lower body dressing/undressing: 1: Total-Patient completed less than 25% of tasks  FIM - Toileting Toileting steps completed by patient: Performs perineal hygiene Toileting: 1: Two helpers  FIM - Radio producer Devices: Product manager Transfers: 1-Two helpers  FIM - Control and instrumentation engineer Devices: Arm rests Bed/Chair Transfer: 2: Supine > Sit: Max A (lifting assist/Pt. 25-49%);2: Chair or W/C > Bed: Max A (lift and lower assist);2: Bed > Chair or W/C: Max A (lift and lower assist);2: Sit > Supine: Max A (lifting assist/Pt. 25-49%)  FIM - Locomotion: Wheelchair Distance: 25 Locomotion:  Wheelchair: 1: Travels less than 50 ft with maximal assistance (Pt: 25 - 49%) (LUE only) FIM - Locomotion: Ambulation Locomotion: Ambulation: 0: Activity did not occur (pt non-ambulatory)  Comprehension Comprehension Mode: Auditory Comprehension: 5-Follows basic conversation/direction: With no assist  Expression Expression Mode: Verbal Expression: 5-Expresses basic needs/ideas: With no assist  Social Interaction Social Interaction: 6-Interacts appropriately with others with medication or extra time (anti-anxiety, antidepressant).  Problem Solving Problem Solving: 5-Solves basic 90% of the time/requires cueing < 10% of the time  Memory Memory: 5-Recognizes or recalls 90% of the time/requires cueing < 10% of the time  Medical Problem List and Plan:  1. Functional deficits secondary to Multi-medical/Deconditioning/history right AKA September 2012  2. DVT Prophylaxis/Anticoagulation: Subcutaneous Lovenox. Monitor platelet counts any signs of bleeding  3. Pain Management: Tylenol as needed  4. Mood/depression: Celexa 10 mg daily. Provide emotional support  5. Neuropsych: This patient is capable of making decisions on her own behalf.  6. Diastolic congestive heart failure. Lasix 60 mg twice a day. Monitor for any signs of fluid overload  7. Hypertension. Norvasc 10 mg daily, Coreg 3.125 mg twice a day, hydralazine 100 mg 3 times a day, Imdur 120 mg daily. Monitor with increased mobility  8. Diabetes mellitus with peripheral neuropathy. Hemoglobin A1c 6.4. Continue sliding scale insulin. Check blood sugars a.c. and at bedtime. Patient diet controlled prior to admission.  9. Chronic renal insufficiency. Baseline creatinine 2.02. Followup chemistries  10. Chronic anemia. Continue Niferex. Followup CBC  11. Hyperlipidemia. Zetia  12. History of gout. Zyloprim. Monitor for any gout flare ups  13. GERD. Pepcid  14. COPD. Compensated has some rhinitis Singulair 10 mg daily    LOS (Days) 2 A  FACE TO FACE EVALUATION WAS PERFORMED  KIRSTEINS,ANDREW E 04/23/2014, 8:25 AM

## 2014-04-23 NOTE — Progress Notes (Signed)
Physical Therapy Session Note  Patient Details  Name: [name] MRN: [id number] Date of Birth: 01/15/1927  Today's Date: 04/23/2014 Time: 2637-8588 Time Calculation (min): 46 min  Short Term Goals: Week 1:  PT Short Term Goal 1 (Week 1): = LTGS due to anticipated LOS  Skilled Therapeutic Interventions/Progress Updates:   Pt received transferring to commode with nurse tech.  Assisted to toilet via squat pivot transfer at +2 assist in order to manage clothing prior to toileting.  Pt able to void in toilet and perform peri care.  Transferred back to w/c again with +2 assist (pt assist 40%).  Continue to provide verbal cues for increased forward weight shift and forward trunk lean.  Once in chair, assisted to/from therapy gym via w/c at total assist.  Skilled session focused on functional transfers without SB, bed mobility and LE stretching/strengthening for improved transfers.  Performed lateral scoot transfer with mod/max assist.  Pt requires facilitation and cues for increased forward weight shift and forward trunk lean for improved buttock clearance.  Once on mat assisted into supine with min assist.  Performed LLE bridging x 10 reps, SL RLE hip abd and hip ext.  Rolling R and L with min assist.  Supine>SL>sit at min/mod assist.  Note marked improvement from morning session.  Worked on lateral scooting on mat for increased forward trunk lean to carryover to transfers.  Transferred back to w/c as stated above and back to room.   Left in w/c with all needs in reach.   Discussed pressure relief at home and in w/c.  Pt able to verbalize lateral leans, however unable to state how long to hold.  Educated to hold lateral leans for at least 1 min each side for adequate circulation.  Pt verbalized understanding.   Therapy Documentation Precautions:  Precautions Precautions: Fall;Other (comment) (DNR) Precaution Comments: R AKA, doesn't stand or walk Restrictions Weight Bearing Restrictions: No    Vital Signs: Therapy Vitals Temp: 98.5 F (36.9 C) Temp src: Oral Pulse Rate: 69 Resp: 18 BP: 136/50 mmHg Patient Position (if appropriate): Sitting Oxygen Therapy SpO2: 95 % O2 Device: None (Room air) Pain: No pain during therapy, except mild in L toe      See FIM for current functional status  Therapy/Group: Individual Therapy  Denice Bors 04/23/2014, 4:22 PM

## 2014-04-24 ENCOUNTER — Encounter (HOSPITAL_COMMUNITY): Payer: Medicare Other | Admitting: Occupational Therapy

## 2014-04-24 ENCOUNTER — Inpatient Hospital Stay (HOSPITAL_COMMUNITY): Payer: Medicare Other | Admitting: Rehabilitation

## 2014-04-24 ENCOUNTER — Inpatient Hospital Stay (HOSPITAL_COMMUNITY): Payer: Medicare Other | Admitting: Occupational Therapy

## 2014-04-24 LAB — GLUCOSE, CAPILLARY
GLUCOSE-CAPILLARY: 104 mg/dL — AB (ref 70–99)
GLUCOSE-CAPILLARY: 140 mg/dL — AB (ref 70–99)
Glucose-Capillary: 111 mg/dL — ABNORMAL HIGH (ref 70–99)
Glucose-Capillary: 160 mg/dL — ABNORMAL HIGH (ref 70–99)

## 2014-04-24 NOTE — Progress Notes (Signed)
Occupational Therapy Session Note  Patient Details  Name: [name] MRN: [id number] Date of Birth: 08-06-1927  Today's Date: 04/24/2014 Time: 5830-9407 Time Calculation (min): 43 min  Skilled Therapeutic Interventions/Progress Updates:    Pt practiced bed transfers with max assist squat pivot to the regular bed.  She needed 3 attempts for sit to squat in order to transfer as she demonstrates increased posterior push when attempting to scoot.  Decreased forward weight shift with decreased placement of the LLE when attempting to scoot.  She transitioned to supine with supervision using grab bar on the side of the bed for support.  In supine had pt perform 1 set of 15 repetitions for left shoulder flexion and 1 set of 15 repetitions elbow flexion for the right.  Attempted shoulder flexion on the left however pt with severe decrepitus and pain so did not attempt.  Pt transitioned back to sitting with supervision and then transferred squat pivot to the left back to the chair with mod assist.  She was able to transfer slightly better to the left than the right.  Finished session with pt transferring to the therapy mat with max assist and completing 2 sets of 10 repetitions for elbow extension for each arm, using the orange light resistance band.    Therapy Documentation Precautions:  Precautions Precautions: Fall;Other (comment) Precaution Comments: R AKA, doesn't stand or walk Restrictions Weight Bearing Restrictions: No  Pain: Pain Assessment Pain Assessment: No/denies pain ADL: See FIM for current functional status  Therapy/Group: Individual Therapy  Nadean Montanaro OTR/L 04/24/2014, 4:22 PM

## 2014-04-24 NOTE — Progress Notes (Signed)
Physical Therapy Session Note  Patient Details  Name: [name] MRN: [id number] Date of Birth: 1927/10/10  Today's Date: 04/24/2014 Time: 0832-0928 Time Calculation (min): 56 min  Short Term Goals: Week 1:  PT Short Term Goal 1 (Week 1): = LTGS due to anticipated LOS  Skilled Therapeutic Interventions/Progress Updates:   Pt received lying in bed this morning, agreeable to therapy.  Pt able to get to EOB with HOB elevated and with use of rails as she will have hospital bed at home.  Pt able to perform at S level with increased time needed to scoot EOB with mod cues for technique.  Once at EOB, performed lateral scoot transfer/squat pivot transfer bed>w/c<>nustep at mod/max assist level.  Pt continues to have increased difficulty with forward lean for increased buttock clearance or if good clearance, note difficulty with trunk shortening for lateral scoot.  Assisted to/from therapy gym at total assist level.  Performed seated nustep x 6 mins at level 1 resistance with BUEs and LLE to increase BUE/LE strengthening and overall endurance.  Tolerated well with single rest break.  Assisted to ADL apt in order to perform transfer and bed mobility to regular bed in order to determine need for hospital bed and also to increase pts independence from regular bed.  Attempted transfer, however pt unable to perform due to fatigue and no rail to grab onto (pt states she normally has rail at home).  Ended session with 80' w/c mobility for continued strengthening and endurance with mod assist.  Assisted back to room and left in w/c.  RN notified of request for pain meds.  All needs in reach.   Therapy Documentation Precautions:  Precautions Precautions: Fall;Other (comment) (DNR) Precaution Comments: R AKA, doesn't stand or walk Restrictions Weight Bearing Restrictions: No     Pain: 4/10 pain in toe Wheelchair Mobility Distance: 35   See FIM for current functional status  Therapy/Group: Individual  Therapy  Denice Bors 04/24/2014, 9:28 AM

## 2014-04-24 NOTE — Progress Notes (Signed)
Physical Therapy Session Note  Patient Details  Name: [name] MRN: [id number] Date of Birth: [birth year]  Today's Date: 04/24/2014 Time: 1532-1600 Time Calculation (min): 28 min  Short Term Goals: Week 1:  PT Short Term Goal 1 (Week 1): = LTGS due to anticipated LOS  Skilled Therapeutic Interventions/Progress Updates:   Pt received supine on therapy mat in gym, having just finished OT session.  Skilled session focused on supine therex for BLE strengthening, scooting EOM and functional transfer with slideboard to increase independence at time of D/C.  Performed BLE SLR x 10 reps, glute sets x 10 reps, SL hip abd x 10 reps BLE and SL hip ext x 10 reps BLE.  Requires cues and assist for maintaining SL during exercises.  Performed SL>sit with max assist.  Note increased difficulty from flat bed due to weakness in B shoulders.  Ended session with scooting forwards on mat with cues and min assist for correct weight shift and forward trunk lean.  Transferred back to w/c using slideboard at min A level, min cues for hand placement, however note marked improvement with slideboard.  Self propelled x 20' using BUEs and LLE, however could not continue due to pain in RUE.  Assisted remainder of distance back to room and left in w/c with all needs in reach.   Therapy Documentation Precautions:  Precautions Precautions: Fall;Other (comment) Precaution Comments: R AKA, doesn't stand or walk Restrictions Weight Bearing Restrictions: No   Vital Signs: Therapy Vitals Temp: 97.7 F (36.5 C) Temp src: Oral Pulse Rate: 59 Resp: 18 BP: 145/64 mmHg Patient Position (if appropriate): Sitting Oxygen Therapy SpO2: 98 % O2 Device: None (Room air) Pain: Pain Assessment Pain Assessment: No/denies pain   Locomotion : Wheelchair Mobility Distance: 20  See FIM for current functional status  Therapy/Group: Individual Therapy  Denice Bors 04/24/2014, 4:40 PM

## 2014-04-24 NOTE — Progress Notes (Signed)
80 y.o. right-handed female with history of diabetes mellitus with peripheral neuropathy, diastolic congestive heart failure, CAD, hypertension and chronic renal insufficiency with creatinine baseline 2.02 and history of right AKA September 2012 and received inpatient rehabilitation services and prosthesis provided by [employer] [facility]. Patient primarily wheelchair bound since January and limited use of prosthesis and assistance of her granddaughter and hired caregivers. Presented 04/16/2014 with complaints of worsening shortness of breath for 2 days as well as nonproductive cough. Noted hypoxemia of 88% in the ED was placed on 3 L oxygen. Chest x-ray showed pulmonary edema small bilateral pleural effusions. She was administered intravenous Lasix. Noted hyperkalemia 5.7 received Kayexalate. Complaints of right shoulder pain with x-rays negative for fracture but did show severe osteoarthritic changes in the glenohumeral joint  Subjective/Complaints: Sinus congestion and drainage, pt states she's had this problem in the past,has been on claritin, no frontal HA  Review of Systems - Negative except joint pain and as above  Objective: Vital Signs: Blood pressure 169/82, pulse 68, temperature 98.6 F (37 C), temperature source Oral, resp. rate 18, height _0  (1.575 m), weight 81.738 kg (180 lb 3.2 oz), SpO2 95.00%. No results found. Results for orders placed during the hospital encounter of 04/21/14 (from the past 72 hour(s))  CBC     Status: Abnormal   Collection Time    04/21/14  4:59 PM      Result Value Ref Range   WBC 8.1  4.0 - 10.5 K/uL   RBC 3.98  3.87 - 5.11 MIL/uL   Hemoglobin 9.1 (*) 12.0 - 15.0 g/dL   HCT 28.4 (*) 36.0 - 46.0 %   MCV 71.4 (*) 78.0 - 100.0 fL   MCH 22.9 (*) 26.0 - 34.0 pg   MCHC 32.0  30.0 - 36.0 g/dL   RDW 17.7 (*) 11.5 - 15.5 %   Platelets 230  150 - 400 K/uL  CREATININE, SERUM     Status: Abnormal   Collection Time    04/21/14  4:59 PM      Result Value Ref  Range   Creatinine, Ser 2.05 (*) 0.50 - 1.10 mg/dL   GFR calc non Af Amer 21 (*) >90 mL/min   GFR calc Af Amer 24 (*) >90 mL/min   Comment: (NOTE)     The eGFR has been calculated using the CKD EPI equation.     This calculation has not been validated in all clinical situations.     eGFR's persistently <90 mL/min signify possible Chronic Kidney     Disease.  GLUCOSE, CAPILLARY     Status: Abnormal   Collection Time    04/21/14  9:16 PM      Result Value Ref Range   Glucose-Capillary 122 (*) 70 - 99 mg/dL  GLUCOSE, CAPILLARY     Status: None   Collection Time    04/22/14 12:01 AM      Result Value Ref Range   Glucose-Capillary 98  70 - 99 mg/dL  GLUCOSE, CAPILLARY     Status: None   Collection Time    04/22/14  4:12 AM      Result Value Ref Range   Glucose-Capillary 96  70 - 99 mg/dL  GLUCOSE, CAPILLARY     Status: Abnormal   Collection Time    04/22/14  7:53 AM      Result Value Ref Range   Glucose-Capillary 102 (*) 70 - 99 mg/dL  CBC WITH DIFFERENTIAL     Status: Abnormal   Collection  Time    04/22/14  9:35 AM      Result Value Ref Range   WBC 9.0  4.0 - 10.5 K/uL   RBC 4.22  3.87 - 5.11 MIL/uL   Hemoglobin 9.6 (*) 12.0 - 15.0 g/dL   HCT 30.6 (*) 36.0 - 46.0 %   MCV 72.5 (*) 78.0 - 100.0 fL   MCH 22.7 (*) 26.0 - 34.0 pg   MCHC 31.4  30.0 - 36.0 g/dL   RDW 17.6 (*) 11.5 - 15.5 %   Platelets 267  150 - 400 K/uL   Neutrophils Relative % 76  43 - 77 %   Neutro Abs 6.8  1.7 - 7.7 K/uL   Lymphocytes Relative 15  12 - 46 %   Lymphs Abs 1.4  0.7 - 4.0 K/uL   Monocytes Relative 6  3 - 12 %   Monocytes Absolute 0.5  0.1 - 1.0 K/uL   Eosinophils Relative 3  0 - 5 %   Eosinophils Absolute 0.3  0.0 - 0.7 K/uL   Basophils Relative 0  0 - 1 %   Basophils Absolute 0.0  0.0 - 0.1 K/uL  COMPREHENSIVE METABOLIC PANEL     Status: Abnormal   Collection Time    04/22/14  9:35 AM      Result Value Ref Range   Sodium 144  137 - 147 mEq/L   Potassium 4.4  3.7 - 5.3 mEq/L   Chloride  102  96 - 112 mEq/L   CO2 26  19 - 32 mEq/L   Glucose, Bld 161 (*) 70 - 99 mg/dL   BUN 75 (*) 6 - 23 mg/dL   Creatinine, Ser 2.01 (*) 0.50 - 1.10 mg/dL   Calcium 9.4  8.4 - 10.5 mg/dL   Total Protein 7.1  6.0 - 8.3 g/dL   Albumin 3.3 (*) 3.5 - 5.2 g/dL   AST 21  0 - 37 U/L   ALT 13  0 - 35 U/L   Alkaline Phosphatase 104  39 - 117 U/L   Total Bilirubin 0.2 (*) 0.3 - 1.2 mg/dL   GFR calc non Af Amer 21 (*) >90 mL/min   GFR calc Af Amer 25 (*) >90 mL/min   Comment: (NOTE)     The eGFR has been calculated using the CKD EPI equation.     This calculation has not been validated in all clinical situations.     eGFR's persistently <90 mL/min signify possible Chronic Kidney     Disease.  GLUCOSE, CAPILLARY     Status: Abnormal   Collection Time    04/22/14 12:06 PM      Result Value Ref Range   Glucose-Capillary 155 (*) 70 - 99 mg/dL   Comment 1 Notify RN    GLUCOSE, CAPILLARY     Status: Abnormal   Collection Time    04/22/14  3:54 PM      Result Value Ref Range   Glucose-Capillary 142 (*) 70 - 99 mg/dL  GLUCOSE, CAPILLARY     Status: Abnormal   Collection Time    04/22/14  8:29 PM      Result Value Ref Range   Glucose-Capillary 129 (*) 70 - 99 mg/dL  GLUCOSE, CAPILLARY     Status: Abnormal   Collection Time    04/22/14 11:58 PM      Result Value Ref Range   Glucose-Capillary 122 (*) 70 - 99 mg/dL   Comment 1 Notify RN    GLUCOSE, CAPILLARY  Status: None   Collection Time    04/23/14  4:11 AM      Result Value Ref Range   Glucose-Capillary 94  70 - 99 mg/dL  GLUCOSE, CAPILLARY     Status: Abnormal   Collection Time    04/23/14  7:54 AM      Result Value Ref Range   Glucose-Capillary 108 (*) 70 - 99 mg/dL   Comment 1 Notify RN    GLUCOSE, CAPILLARY     Status: Abnormal   Collection Time    04/23/14 11:39 AM      Result Value Ref Range   Glucose-Capillary 147 (*) 70 - 99 mg/dL   Comment 1 Notify RN    GLUCOSE, CAPILLARY     Status: Abnormal   Collection Time     04/23/14  4:35 PM      Result Value Ref Range   Glucose-Capillary 137 (*) 70 - 99 mg/dL  GLUCOSE, CAPILLARY     Status: Abnormal   Collection Time    04/23/14 10:01 PM      Result Value Ref Range   Glucose-Capillary 115 (*) 70 - 99 mg/dL     HEENT: normal Cardio: RRR and no murmur Resp: CTA B/L and unlabored GI: BS positive and non distended Extremity:  Pulses positive and Edema R AKA distal Skin:   Intact Neuro: Alert/Oriented and Abnormal Motor 3-/5 Bilateral deltoid, 5/5 Biceps and trips and grip, 4/5 HF, Left KE and Ankle Musc/Skel:  Other crepitus bilateral shoulder Gen NAD, Morbidly obese   Assessment/Plan: 1. Functional deficits secondary to Deconditioning after exacerbation of CHF which require 3+ hours per day of interdisciplinary therapy in a comprehensive inpatient rehab setting. Physiatrist is providing close team supervision and 24 hour management of active medical problems listed below. Physiatrist and rehab team continue to assess barriers to discharge/monitor patient progress toward functional and medical goals. FIM: FIM - Bathing Bathing Steps Patient Completed: Chest;Right Arm;Abdomen;Front perineal area;Left upper leg Bathing: 3: Mod-Patient completes 5-7 20f10 parts or 50-74%  FIM - Upper Body Dressing/Undressing Upper body dressing/undressing steps patient completed: Thread/unthread right sleeve of pullover shirt/dresss;Thread/unthread left sleeve of pullover shirt/dress;Pull shirt over trunk;Put head through opening of pull over shirt/dress Upper body dressing/undressing: 2: Max-Patient completed 25-49% of tasks FIM - Lower Body Dressing/Undressing Lower body dressing/undressing steps patient completed: Thread/unthread right underwear leg;Thread/unthread left underwear leg;Thread/unthread right pants leg;Thread/unthread left pants leg Lower body dressing/undressing: 1: Total-Patient completed less than 25% of tasks  FIM - Toileting Toileting steps completed  by patient: Performs perineal hygiene Toileting: 1: Two helpers  FIM - TRadio producerDevices: GProduct managerTransfers: 1-Two helpers  FIM - BControl and instrumentation engineerDevices: Arm rests Bed/Chair Transfer: 2: Supine > Sit: Max A (lifting assist/Pt. 25-49%);2: Sit > Supine: Max A (lifting assist/Pt. 25-49%);3: Bed > Chair or W/C: Mod A (lift or lower assist);3: Chair or W/C > Bed: Mod A (lift or lower assist)  FIM - Locomotion: Wheelchair Distance: 40 Locomotion: Wheelchair: 1: Travels less than 50 ft with moderate assistance (Pt: 50 - 74%) FIM - Locomotion: Ambulation Locomotion: Ambulation: 0: Activity did not occur  Comprehension Comprehension Mode: Auditory Comprehension: 5-Follows basic conversation/direction: With no assist  Expression Expression Mode: Verbal Expression: 5-Expresses basic needs/ideas: With no assist  Social Interaction Social Interaction: 6-Interacts appropriately with others with medication or extra time (anti-anxiety, antidepressant).  Problem Solving Problem Solving: 5-Solves basic 90% of the time/requires cueing < 10% of the time  Memory Memory: 5-Recognizes or recalls 90% of the time/requires cueing < 10% of the time  Medical Problem List and Plan:  1. Functional deficits secondary to Multi-medical/Deconditioning/history right AKA September 2012  2. DVT Prophylaxis/Anticoagulation: Subcutaneous Lovenox. Monitor platelet counts any signs of bleeding  3. Pain Management: Tylenol as needed  4. Mood/depression: Celexa 10 mg daily. Provide emotional support  5. Neuropsych: This patient is capable of making decisions on her own behalf.  6. Diastolic congestive heart failure. Lasix 60 mg twice a day. Monitor for any signs of fluid overload  7. Hypertension. Norvasc 10 mg daily, Coreg 3.125 mg twice a day, hydralazine 100 mg 3 times a day, Imdur 120 mg daily. Monitor with increased mobility  8. Diabetes  mellitus with peripheral neuropathy. Hemoglobin A1c 6.4. Continue sliding scale insulin. Check blood sugars a.c. and at bedtime. Patient diet controlled prior to admission.  9. Chronic renal insufficiency. Baseline creatinine 2.02. Followup chemistries  10. Chronic anemia. Continue Niferex.Microcytic indices Followup PCP 11. Hyperlipidemia. Zetia  12. History of gout. Zyloprim. Monitor for any gout flare ups  13. GERD. Pepcid  14. COPD. Compensated has some rhinitis Singulair 10 mg daily    LOS (Days) 3 A FACE TO FACE EVALUATION WAS PERFORMED  KIRSTEINS,ANDREW E 04/24/2014, 7:22 AM

## 2014-04-24 NOTE — Progress Notes (Signed)
Occupational Therapy Session Note  Patient Details  Name: [name] MRN: [id number] Date of Birth: [birth year]  Today's Date: 04/24/2014 Time: 1105-1200 Time Calculation (min): 55 min  Short Term Goals: Week 1:  OT Short Term Goal 1 (Week 1): UB bathing/dressing w/ Min A seated  OT Short Term Goal 1 - Progress (Week 1): Progressing toward goal OT Short Term Goal 2 (Week 1): LB bathe/dress Mod A w/ A/E PRN OT Short Term Goal 2 - Progress (Week 1): Progressing toward goal OT Short Term Goal 3 (Week 1): Toilet transfer w/ Min A w/c to drop arm 3:1 OT Short Term Goal 3 - Progress (Week 1): Progressing toward goal OT Short Term Goal 4 (Week 1): Pt will be Min A w/ A/E use during ADL's and compensatory techniques secondary to decreased functional use/arthritis bilateral UE's (R>L). OT Short Term Goal 4 - Progress (Week 1): Progressing toward goal OT Short Term Goal 5 (Week 1): Lateral scoot/squat pivot transfers Min A OT Short Term Goal 5 - Progress (Week 1): Progressing toward goal  Skilled Therapeutic Interventions/Progress Updates:    1:1 self care retraining at w/c level at the sink with bathing and dressing.  Focus on maneuvering the w/c, lateral leans on the bed for washing peri area, reciprocal scooting in the w/c forwards and backwards, toileting and toilet transfer, etc. Pt transfer to the toilet with grab bar with max A with more than extra time.  Pt with c/o stomach cramps; once on the toilet pt became nauseous and vomited RN notified and came in. Pt requested to sit on the toilet for more time. Left pt with RN tech.   Therapy Documentation Precautions:  Precautions Precautions: Fall;Other (comment) (DNR) Precaution Comments: R AKA, doesn't stand or walk Restrictions Weight Bearing Restrictions: No Pain: Stomach pain and cramps- RN aware  See FIM for current functional status  Therapy/Group: Individual Therapy  Willeen Cass [facility] 04/24/2014, 12:27 PM

## 2014-04-25 LAB — GLUCOSE, CAPILLARY
GLUCOSE-CAPILLARY: 134 mg/dL — AB (ref 70–99)
Glucose-Capillary: 101 mg/dL — ABNORMAL HIGH (ref 70–99)
Glucose-Capillary: 132 mg/dL — ABNORMAL HIGH (ref 70–99)
Glucose-Capillary: 105 mg/dL — ABNORMAL HIGH (ref 70–99)

## 2014-04-25 MED ORDER — BISACODYL 10 MG RE SUPP
10.0000 mg | Freq: Every day | RECTAL | Status: DC | PRN
  Administered 2014-04-25: 10 mg via RECTAL
  Filled 2014-04-25: qty 1

## 2014-04-25 NOTE — Plan of Care (Signed)
Problem: RH PAIN MANAGEMENT Goal: RH STG PAIN MANAGED AT OR BELOW PT'S PAIN GOAL Pain less than 3  Outcome: Not Progressing Increasing pain on L great toe. Reports 10/10.

## 2014-04-25 NOTE — Progress Notes (Signed)
80 y.o. right-handed female with history of diabetes mellitus with peripheral neuropathy, diastolic congestive heart failure, CAD, hypertension and chronic renal insufficiency with creatinine baseline 2.02 and history of right AKA September 2012 and received inpatient rehabilitation services and prosthesis provided by [employer] [facility]. Patient primarily wheelchair bound since January and limited use of prosthesis and assistance of her granddaughter and hired caregivers. Presented 04/16/2014 with complaints of worsening shortness of breath for 2 days as well as nonproductive cough. Noted hypoxemia of 88% in the ED was placed on 3 L oxygen. Chest x-ray showed pulmonary edema small bilateral pleural effusions. She was administered intravenous Lasix. Noted hyperkalemia 5.7 received Kayexalate. Complaints of right shoulder pain with x-rays negative for fracture but did show severe osteoarthritic changes in the glenohumeral joint  Subjective/Complaints: Left foot pain with prolonged shoe wear  Review of Systems - Negative except joint pain and as above  Objective: Vital Signs: Blood pressure 135/60, pulse 62, temperature 98.2 F (36.8 C), temperature source Oral, resp. rate 19, height _0  (1.575 m), weight 83.235 kg (183 lb 8 oz), SpO2 98.00%. No results found. Results for orders placed during the hospital encounter of 04/21/14 (from the past 72 hour(s))  CBC WITH DIFFERENTIAL     Status: Abnormal   Collection Time    04/22/14  9:35 AM      Result Value Ref Range   WBC 9.0  4.0 - 10.5 K/uL   RBC 4.22  3.87 - 5.11 MIL/uL   Hemoglobin 9.6 (*) 12.0 - 15.0 g/dL   HCT 30.6 (*) 36.0 - 46.0 %   MCV 72.5 (*) 78.0 - 100.0 fL   MCH 22.7 (*) 26.0 - 34.0 pg   MCHC 31.4  30.0 - 36.0 g/dL   RDW 17.6 (*) 11.5 - 15.5 %   Platelets 267  150 - 400 K/uL   Neutrophils Relative % 76  43 - 77 %   Neutro Abs 6.8  1.7 - 7.7 K/uL   Lymphocytes Relative 15  12 - 46 %   Lymphs Abs 1.4  0.7 - 4.0 K/uL   Monocytes  Relative 6  3 - 12 %   Monocytes Absolute 0.5  0.1 - 1.0 K/uL   Eosinophils Relative 3  0 - 5 %   Eosinophils Absolute 0.3  0.0 - 0.7 K/uL   Basophils Relative 0  0 - 1 %   Basophils Absolute 0.0  0.0 - 0.1 K/uL  COMPREHENSIVE METABOLIC PANEL     Status: Abnormal   Collection Time    04/22/14  9:35 AM      Result Value Ref Range   Sodium 144  137 - 147 mEq/L   Potassium 4.4  3.7 - 5.3 mEq/L   Chloride 102  96 - 112 mEq/L   CO2 26  19 - 32 mEq/L   Glucose, Bld 161 (*) 70 - 99 mg/dL   BUN 75 (*) 6 - 23 mg/dL   Creatinine, Ser 2.01 (*) 0.50 - 1.10 mg/dL   Calcium 9.4  8.4 - 10.5 mg/dL   Total Protein 7.1  6.0 - 8.3 g/dL   Albumin 3.3 (*) 3.5 - 5.2 g/dL   AST 21  0 - 37 U/L   ALT 13  0 - 35 U/L   Alkaline Phosphatase 104  39 - 117 U/L   Total Bilirubin 0.2 (*) 0.3 - 1.2 mg/dL   GFR calc non Af Amer 21 (*) >90 mL/min   GFR calc Af Amer 25 (*) >90 mL/min  Comment: (NOTE)     The eGFR has been calculated using the CKD EPI equation.     This calculation has not been validated in all clinical situations.     eGFR's persistently <90 mL/min signify possible Chronic Kidney     Disease.  GLUCOSE, CAPILLARY     Status: Abnormal   Collection Time    04/22/14 12:06 PM      Result Value Ref Range   Glucose-Capillary 155 (*) 70 - 99 mg/dL   Comment 1 Notify RN    GLUCOSE, CAPILLARY     Status: Abnormal   Collection Time    04/22/14  3:54 PM      Result Value Ref Range   Glucose-Capillary 142 (*) 70 - 99 mg/dL  GLUCOSE, CAPILLARY     Status: Abnormal   Collection Time    04/22/14  8:29 PM      Result Value Ref Range   Glucose-Capillary 129 (*) 70 - 99 mg/dL  GLUCOSE, CAPILLARY     Status: Abnormal   Collection Time    04/22/14 11:58 PM      Result Value Ref Range   Glucose-Capillary 122 (*) 70 - 99 mg/dL   Comment 1 Notify RN    GLUCOSE, CAPILLARY     Status: None   Collection Time    04/23/14  4:11 AM      Result Value Ref Range   Glucose-Capillary 94  70 - 99 mg/dL  GLUCOSE,  CAPILLARY     Status: Abnormal   Collection Time    04/23/14  7:54 AM      Result Value Ref Range   Glucose-Capillary 108 (*) 70 - 99 mg/dL   Comment 1 Notify RN    GLUCOSE, CAPILLARY     Status: Abnormal   Collection Time    04/23/14 11:39 AM      Result Value Ref Range   Glucose-Capillary 147 (*) 70 - 99 mg/dL   Comment 1 Notify RN    GLUCOSE, CAPILLARY     Status: Abnormal   Collection Time    04/23/14  4:35 PM      Result Value Ref Range   Glucose-Capillary 137 (*) 70 - 99 mg/dL  GLUCOSE, CAPILLARY     Status: Abnormal   Collection Time    04/23/14 10:01 PM      Result Value Ref Range   Glucose-Capillary 115 (*) 70 - 99 mg/dL  GLUCOSE, CAPILLARY     Status: Abnormal   Collection Time    04/24/14  7:20 AM      Result Value Ref Range   Glucose-Capillary 111 (*) 70 - 99 mg/dL   Comment 1 Notify RN    GLUCOSE, CAPILLARY     Status: Abnormal   Collection Time    04/24/14 11:15 AM      Result Value Ref Range   Glucose-Capillary 160 (*) 70 - 99 mg/dL  GLUCOSE, CAPILLARY     Status: Abnormal   Collection Time    04/24/14  4:39 PM      Result Value Ref Range   Glucose-Capillary 104 (*) 70 - 99 mg/dL  GLUCOSE, CAPILLARY     Status: Abnormal   Collection Time    04/24/14  9:33 PM      Result Value Ref Range   Glucose-Capillary 140 (*) 70 - 99 mg/dL  GLUCOSE, CAPILLARY     Status: Abnormal   Collection Time    04/25/14  7:58 AM  Result Value Ref Range   Glucose-Capillary 101 (*) 70 - 99 mg/dL     HEENT: normal Cardio: RRR and no murmur Resp: CTA B/L and unlabored GI: BS positive and non distended Extremity:  Pulses positive and Edema R AKA distal, Left hallux valgus Skin:   Intact Neuro: Alert/Oriented and Abnormal Motor 3-/5 Bilateral deltoid, 5/5 Biceps and trips and grip, 4/5 HF, Left KE and Ankle Musc/Skel:  Other crepitus bilateral shoulder Gen NAD, Morbidly obese   Assessment/Plan: 1. Functional deficits secondary to Deconditioning after exacerbation of  CHF which require 3+ hours per day of interdisciplinary therapy in a comprehensive inpatient rehab setting. Physiatrist is providing close team supervision and 24 hour management of active medical problems listed below. Physiatrist and rehab team continue to assess barriers to discharge/monitor patient progress toward functional and medical goals. FIM: FIM - Bathing Bathing Steps Patient Completed: Chest;Right Arm;Abdomen;Right upper leg;Left upper leg;Left Arm Bathing: 3: Mod-Patient completes 5-7 53f10 parts or 50-74%  FIM - Upper Body Dressing/Undressing Upper body dressing/undressing steps patient completed: Thread/unthread right sleeve of pullover shirt/dresss;Thread/unthread left sleeve of pullover shirt/dress;Thread/unthread right bra strap;Thread/unthread left bra strap;Put head through opening of pull over shirt/dress;Pull shirt over trunk Upper body dressing/undressing: 4: Min-Patient completed 75 plus % of tasks FIM - Lower Body Dressing/Undressing Lower body dressing/undressing steps patient completed: Thread/unthread right underwear leg;Thread/unthread left underwear leg;Thread/unthread right pants leg;Thread/unthread left pants leg Lower body dressing/undressing: 2: Max-Patient completed 25-49% of tasks  FIM - Toileting Toileting steps completed by patient: Performs perineal hygiene Toileting: 1: Total-Patient completed zero steps, helper did all 3  FIM - TRadio producerDevices: Grab bars Toilet Transfers: 2-From toilet/BSC: Max A (lift and lower assist);2-To toilet/BSC: Max A (lift and lower assist)  FIM - BEngineer, siteAssistive Devices: Arm rests;Sliding board Bed/Chair Transfer: 4: Bed > Chair or W/C: Min A (steadying Pt. > 75%) (mat to w/c)  FIM - Locomotion: Wheelchair Distance: 20 Locomotion: Wheelchair: 1: Travels less than 50 ft with moderate assistance (Pt: 50 - 74%) FIM - Locomotion: Ambulation Locomotion:  Ambulation: 0: Activity did not occur  Comprehension Comprehension Mode: Auditory Comprehension: 5-Follows basic conversation/direction: With no assist  Expression Expression Mode: Verbal Expression: 5-Expresses basic needs/ideas: With no assist  Social Interaction Social Interaction: 6-Interacts appropriately with others with medication or extra time (anti-anxiety, antidepressant).  Problem Solving Problem Solving: 5-Solves basic 90% of the time/requires cueing < 10% of the time  Memory Memory: 5-Recognizes or recalls 90% of the time/requires cueing < 10% of the time  Medical Problem List and Plan:  1. Functional deficits secondary to Multi-medical/Deconditioning/history right AKA September 2012  2. DVT Prophylaxis/Anticoagulation: Subcutaneous Lovenox. Monitor platelet counts any signs of bleeding  3. Pain Management: Tylenol as needed , Left foot pain hallux valgus diabetic sandal needed 4. Mood/depression: Celexa 10 mg daily. Provide emotional support  5. Neuropsych: This patient is capable of making decisions on her own behalf.  6. Diastolic congestive heart failure. Lasix 60 mg twice a day. Monitor for any signs of fluid overload  7. Hypertension. Norvasc 10 mg daily, Coreg 3.125 mg twice a day, hydralazine 100 mg 3 times a day, Imdur 120 mg daily. Monitor with increased mobility  8. Diabetes mellitus with peripheral neuropathy. Hemoglobin A1c 6.4. Continue sliding scale insulin. Check blood sugars a.c. and at bedtime. Patient diet controlled prior to admission.  9. Chronic renal insufficiency. Baseline creatinine 2.02. Followup chemistries  10. Chronic anemia. Continue Niferex.Microcytic indices Followup PCP 11.  Hyperlipidemia. Zetia  12. History of gout. Zyloprim. Monitor for any gout flare ups  13. GERD. Pepcid  14. COPD. Compensated has some rhinitis Singulair 10 mg daily    LOS (Days) 4 A FACE TO FACE EVALUATION WAS PERFORMED  Manish Ruggiero E 04/25/2014, 9:02 AM

## 2014-04-26 ENCOUNTER — Inpatient Hospital Stay (HOSPITAL_COMMUNITY): Payer: Medicare Other | Admitting: *Deleted

## 2014-04-26 LAB — GLUCOSE, CAPILLARY
GLUCOSE-CAPILLARY: 149 mg/dL — AB (ref 70–99)
Glucose-Capillary: 104 mg/dL — ABNORMAL HIGH (ref 70–99)
Glucose-Capillary: 139 mg/dL — ABNORMAL HIGH (ref 70–99)
Glucose-Capillary: 197 mg/dL — ABNORMAL HIGH (ref 70–99)

## 2014-04-26 NOTE — Progress Notes (Signed)
Physical Therapy Session Note  Patient Details  Name: [name] MRN: [id number] Date of Birth: 1927/10/15  Today's Date: 04/26/2014   Skilled Therapeutic Interventions/Progress Updates: session I 1100-1150 (50 min) Patient in w/c ,agrees to session, attempted propulsion to gym, however due to pain in R UE patient was not able to complete the distance. Transfer training with use of SB -out to mat with mod A and back to w/c with min A, patient needs increased cues for placing of board and for hand placement during transfers. In supine :SLR 2 x 15, Hip abd 2 x10 and add 2x10, attempted bridging -discontinued  due to pain in LE. In sitting EOM training in scooting R and L with use of push up props -increased rest breaks due to fatigue.  Patient complains of pain in R shoulder-hop packed applied and ROM and manual facilitation of movement performed, gentle massage to reduce pain. Patient returned to room, sitting in w/c all needs within reach.  Session II 1345-1430 Session focused to training in pressure relieving techniques when in w/c, pushing from arm rests and weight shifting when sitting.  Therapeutic exercises with orange theraband to increase LE strength, exercises for B UE with weighted ball. At the end of the session patient asked to go to the bathroom, but unable to hold and had a BM incontinence episode. Patient transferred to bed with use of SB,max A. NT notified to assist patient in changing clothes.   Therapy Documentation Precautions:  Precautions Precautions: Fall;Other (comment) Precaution Comments: R AKA, doesn't stand or walk Restrictions Weight Bearing Restrictions: No Pain: Pain Assessment Pain Assessment: 0-10 Pain Score:  (same-with therapy) Pain Type: Acute pain Pain Location: Toe (Comment which one) (great toe) Pain Orientation: Left Pain Descriptors / Indicators: Sore Pain Frequency: Intermittent Pain Onset: On-going Pain Intervention(s): Medication (See  eMAR)  See FIM for current functional status  Therapy/Group: Individual Therapy  Guadlupe Spanish 04/26/2014, 12:50 PM

## 2014-04-26 NOTE — Progress Notes (Signed)
80 y.o. right-handed female with history of diabetes mellitus with peripheral neuropathy, diastolic congestive heart failure, CAD, hypertension and chronic renal insufficiency with creatinine baseline 2.02 and history of right AKA September 2012 and received inpatient rehabilitation services and prosthesis provided by [employer] [facility]. Patient primarily wheelchair bound since January and limited use of prosthesis and assistance of her granddaughter and hired caregivers. Presented 04/16/2014 with complaints of worsening shortness of breath for 2 days as well as nonproductive cough. Noted hypoxemia of 88% in the ED was placed on 3 L oxygen. Chest x-ray showed pulmonary edema small bilateral pleural effusions. She was administered intravenous Lasix. Noted hyperkalemia 5.7 received Kayexalate. Complaints of right shoulder pain with x-rays negative for fracture but did show severe osteoarthritic changes in the glenohumeral joint  Subjective/Complaints: Plastizote shoe not delivered No foot pain in bed, no PT yesterday, full schedule today No SOB or CP or LLE swelling Review of Systems - Negative except joint pain and as above  Objective: Vital Signs: Blood pressure 154/71, pulse 64, temperature 98.7 F (37.1 C), temperature source Oral, resp. rate 18, height 5\' 2"  (1.575 m), weight 83.054 kg (183 lb 1.6 oz), SpO2 94.00%. No results found. Results for orders placed during the hospital encounter of 04/21/14 (from the past 72 hour(s))  GLUCOSE, CAPILLARY     Status: Abnormal   Collection Time    04/23/14  7:54 AM      Result Value Ref Range   Glucose-Capillary 108 (*) 70 - 99 mg/dL   Comment 1 Notify RN    GLUCOSE, CAPILLARY     Status: Abnormal   Collection Time    04/23/14 11:39 AM      Result Value Ref Range   Glucose-Capillary 147 (*) 70 - 99 mg/dL   Comment 1 Notify RN    GLUCOSE, CAPILLARY     Status: Abnormal   Collection Time    04/23/14  4:35 PM      Result Value Ref Range   Glucose-Capillary 137 (*) 70 - 99 mg/dL  GLUCOSE, CAPILLARY     Status: Abnormal   Collection Time    04/23/14 10:01 PM      Result Value Ref Range   Glucose-Capillary 115 (*) 70 - 99 mg/dL  GLUCOSE, CAPILLARY     Status: Abnormal   Collection Time    04/24/14  7:20 AM      Result Value Ref Range   Glucose-Capillary 111 (*) 70 - 99 mg/dL   Comment 1 Notify RN    GLUCOSE, CAPILLARY     Status: Abnormal   Collection Time    04/24/14 11:15 AM      Result Value Ref Range   Glucose-Capillary 160 (*) 70 - 99 mg/dL  GLUCOSE, CAPILLARY     Status: Abnormal   Collection Time    04/24/14  4:39 PM      Result Value Ref Range   Glucose-Capillary 104 (*) 70 - 99 mg/dL  GLUCOSE, CAPILLARY     Status: Abnormal   Collection Time    04/24/14  9:33 PM      Result Value Ref Range   Glucose-Capillary 140 (*) 70 - 99 mg/dL  GLUCOSE, CAPILLARY     Status: Abnormal   Collection Time    04/25/14  7:58 AM      Result Value Ref Range   Glucose-Capillary 101 (*) 70 - 99 mg/dL  GLUCOSE, CAPILLARY     Status: Abnormal   Collection Time    04/25/14 11:31  AM      Result Value Ref Range   Glucose-Capillary 134 (*) 70 - 99 mg/dL   Comment 1 Notify RN    GLUCOSE, CAPILLARY     Status: Abnormal   Collection Time    04/25/14  4:47 PM      Result Value Ref Range   Glucose-Capillary 132 (*) 70 - 99 mg/dL  GLUCOSE, CAPILLARY     Status: Abnormal   Collection Time    04/25/14 10:15 PM      Result Value Ref Range   Glucose-Capillary 105 (*) 70 - 99 mg/dL   Comment 1 Notify RN       HEENT: normal Cardio: RRR and no murmur Resp: CTA B/L and unlabored GI: BS positive and non distended Extremity:  Pulses positive and Edema R AKA distal, Left hallux valgus Skin:   Intact Neuro: Alert/Oriented and Abnormal Motor 3-/5 Bilateral deltoid, 5/5 Biceps and trips and grip, 4/5 HF, Left KE and Ankle Musc/Skel:  Other crepitus bilateral shoulder Gen NAD, Morbidly obese   Assessment/Plan: 1. Functional  deficits secondary to Deconditioning after exacerbation of CHF which require 3+ hours per day of interdisciplinary therapy in a comprehensive inpatient rehab setting. Physiatrist is providing close team supervision and 24 hour management of active medical problems listed below. Physiatrist and rehab team continue to assess barriers to discharge/monitor patient progress toward functional and medical goals. FIM: FIM - Bathing Bathing Steps Patient Completed: Chest;Right Arm;Abdomen;Right upper leg;Left upper leg;Left Arm Bathing: 3: Mod-Patient completes 5-7 87f 10 parts or 50-74%  FIM - Upper Body Dressing/Undressing Upper body dressing/undressing steps patient completed: Thread/unthread right sleeve of pullover shirt/dresss;Thread/unthread left sleeve of pullover shirt/dress;Thread/unthread right bra strap;Thread/unthread left bra strap;Put head through opening of pull over shirt/dress;Pull shirt over trunk Upper body dressing/undressing: 4: Min-Patient completed 75 plus % of tasks FIM - Lower Body Dressing/Undressing Lower body dressing/undressing steps patient completed: Thread/unthread right underwear leg;Thread/unthread left underwear leg;Thread/unthread right pants leg;Thread/unthread left pants leg Lower body dressing/undressing: 2: Max-Patient completed 25-49% of tasks  FIM - Toileting Toileting steps completed by patient: Performs perineal hygiene Toileting: 1: Total-Patient completed zero steps, helper did all 3  FIM - Radio producer Devices: Grab bars Toilet Transfers: 2-From toilet/BSC: Max A (lift and lower assist);2-To toilet/BSC: Max A (lift and lower assist)  FIM - Engineer, site Assistive Devices: Arm rests;Sliding board Bed/Chair Transfer: 4: Bed > Chair or W/C: Min A (steadying Pt. > 75%) (mat to w/c)  FIM - Locomotion: Wheelchair Distance: 20 Locomotion: Wheelchair: 1: Travels less than 50 ft with moderate assistance  (Pt: 50 - 74%) FIM - Locomotion: Ambulation Locomotion: Ambulation: 0: Activity did not occur  Comprehension Comprehension Mode: Auditory Comprehension: 5-Follows basic conversation/direction: With no assist  Expression Expression Mode: Verbal Expression: 5-Expresses basic needs/ideas: With no assist  Social Interaction Social Interaction: 6-Interacts appropriately with others with medication or extra time (anti-anxiety, antidepressant).  Problem Solving Problem Solving: 5-Solves basic 90% of the time/requires cueing < 10% of the time  Memory Memory: 5-Recognizes or recalls 90% of the time/requires cueing < 10% of the time  Medical Problem List and Plan:  1. Functional deficits secondary to Multi-medical/Deconditioning/history right AKA September 2012  2. DVT Prophylaxis/Anticoagulation: Subcutaneous Lovenox. Monitor platelet counts any signs of bleeding  3. Pain Management: Tylenol as needed , Left foot pain hallux valgus diabetic sandal needed 4. Mood/depression: Celexa 10 mg daily. Provide emotional support  5. Neuropsych: This patient is capable of making decisions on  her own behalf.  6. Diastolic congestive heart failure. Lasix 60 mg twice a day. Monitor for any signs of fluid overload  7. Hypertension. Norvasc 10 mg daily, Coreg 3.125 mg twice a day, hydralazine 100 mg 3 times a day, Imdur 120 mg daily. Monitor with increased mobility  8. Diabetes mellitus with peripheral neuropathy. Hemoglobin A1c 6.4. Continue sliding scale insulin. Check blood sugars a.c. and at bedtime. Patient diet controlled prior to admission.  9. Chronic renal insufficiency. Baseline creatinine 2.02. Followup chemistries  10. Chronic anemia. Continue Niferex.Microcytic indices Followup PCP 11. Hyperlipidemia. Zetia  12. History of gout. Zyloprim. Monitor for any gout flare ups  13. GERD. Pepcid  14. COPD. Compensated has some rhinitis Singulair 10 mg daily    LOS (Days) 5 A FACE TO FACE EVALUATION  WAS PERFORMED  Sejla Marzano E 04/26/2014, 7:11 AM

## 2014-04-26 NOTE — Progress Notes (Addendum)
Occupational Therapy Session Note  Patient Details  Name: [name] MRN: [id number] Date of Birth: 04/28/1927  Today's Date: 04/26/2014 Time:  -   0730-0830   (60 min)  1st session    Short Term Goals: Week 1:  OT Short Term Goal 1 (Week 1): UB bathing/dressing w/ Min A seated  OT Short Term Goal 1 - Progress (Week 1): Progressing toward goal OT Short Term Goal 2 (Week 1): LB bathe/dress Mod A w/ A/E PRN OT Short Term Goal 2 - Progress (Week 1): Progressing toward goal OT Short Term Goal 3 (Week 1): Toilet transfer w/ Min A w/c to drop arm 3:1 OT Short Term Goal 3 - Progress (Week 1): Progressing toward goal OT Short Term Goal 4 (Week 1): Pt will be Min A w/ A/E use during ADL's and compensatory techniques secondary to decreased functional use/arthritis bilateral UE's (R>L). OT Short Term Goal 4 - Progress (Week 1): Progressing toward goal OT Short Term Goal 5 (Week 1): Lateral scoot/squat pivot transfers Min A OT Short Term Goal 5 - Progress (Week 1): Progressing toward goal :     Skilled Therapeutic Interventions/Progress Updates:      1st session:  Pt lying in bed upon OT arrival.  Pt. Rolled to the right and pushed up with minimal assist with facilitation at hips and scapula.   Sat on  EOB and performed UB bathing and dressing.  Did lateral leans and rolling for LB with mod assist.  Pt. complained of ingrown toenail hurting on left foot.  Applied Hydrogen peroxide and will keep in room for pt to apply after washing feet on daily basis.  Pt. Transferred to wc with sliding board and mod assist.   Pt. Left in wc with call bell and phone in reach.    Therapy Documentation Precautions:  Precautions Precautions: Fall;Other (comment) Precaution Comments: R AKA, doesn't stand or walk Restrictions Weight Bearing Restrictions: No   Pain:1st session:  None except with left toe when touched then pain went to 10/10.   Other Treatments:    2nd session:  Time:  1300-1330   (30 min) Pain:  4/10 in shoulders with movement Individual session;  Addressed UE PROM, scapular mobs in all planes.  Able to  reach  0-45 degrees shoulder flex or abduction due to pt having pain and max decrepitus in shoulder.  Performed pendulum exercises for decreased pain and pt able to do with minimal cueing.  Addressed scapular movements with pt assisting with scapula adduction and depression.  She had no pain or decrepitus with these exercises.     See FIM for current functional status  Therapy/Group: Individual Therapy  Lisa Roca 04/26/2014, 8:07 AM

## 2014-04-27 ENCOUNTER — Inpatient Hospital Stay (HOSPITAL_COMMUNITY): Payer: Medicare Other | Admitting: Rehabilitation

## 2014-04-27 ENCOUNTER — Inpatient Hospital Stay (HOSPITAL_COMMUNITY): Payer: Medicare Other | Admitting: Occupational Therapy

## 2014-04-27 ENCOUNTER — Inpatient Hospital Stay (HOSPITAL_COMMUNITY): Payer: Medicare Other | Admitting: *Deleted

## 2014-04-27 DIAGNOSIS — I5031 Acute diastolic (congestive) heart failure: Secondary | ICD-10-CM

## 2014-04-27 DIAGNOSIS — R5381 Other malaise: Secondary | ICD-10-CM

## 2014-04-27 DIAGNOSIS — N184 Chronic kidney disease, stage 4 (severe): Secondary | ICD-10-CM

## 2014-04-27 DIAGNOSIS — E119 Type 2 diabetes mellitus without complications: Secondary | ICD-10-CM

## 2014-04-27 DIAGNOSIS — I10 Essential (primary) hypertension: Secondary | ICD-10-CM

## 2014-04-27 LAB — GLUCOSE, CAPILLARY
Glucose-Capillary: 107 mg/dL — ABNORMAL HIGH (ref 70–99)
Glucose-Capillary: 128 mg/dL — ABNORMAL HIGH (ref 70–99)
Glucose-Capillary: 117 mg/dL — ABNORMAL HIGH (ref 70–99)
Glucose-Capillary: 156 mg/dL — ABNORMAL HIGH (ref 70–99)

## 2014-04-27 NOTE — Progress Notes (Signed)
80 y.o. right-handed female with history of diabetes mellitus with peripheral neuropathy, diastolic congestive heart failure, CAD, hypertension and chronic renal insufficiency with creatinine baseline 2.02 and history of right AKA September 2012 and received inpatient rehabilitation services and prosthesis provided by [employer] [facility]. Patient primarily wheelchair bound since January and limited use of prosthesis and assistance of her granddaughter and hired caregivers. Presented 04/16/2014 with complaints of worsening shortness of breath for 2 days as well as nonproductive cough. Noted hypoxemia of 88% in the ED was placed on 3 L oxygen. Chest x-ray showed pulmonary edema small bilateral pleural effusions. She was administered intravenous Lasix. Noted hyperkalemia 5.7 received Kayexalate. Complaints of right shoulder pain with x-rays negative for fracture but did show severe osteoarthritic changes in the glenohumeral joint  Subjective/Complaints: Denies pain, sob, cough.  Review of Systems - Negative except joint pain   Objective: Vital Signs: Blood pressure 161/70, pulse 77, temperature 98.8 F (37.1 C), temperature source Oral, resp. rate 19, height 5\' 2"  (1.575 m), weight 83.054 kg (183 lb 1.6 oz), SpO2 90.00%. No results found. Results for orders placed during the hospital encounter of 04/21/14 (from the past 72 hour(s))  GLUCOSE, CAPILLARY     Status: Abnormal   Collection Time    04/24/14 11:15 AM      Result Value Ref Range   Glucose-Capillary 160 (*) 70 - 99 mg/dL  GLUCOSE, CAPILLARY     Status: Abnormal   Collection Time    04/24/14  4:39 PM      Result Value Ref Range   Glucose-Capillary 104 (*) 70 - 99 mg/dL  GLUCOSE, CAPILLARY     Status: Abnormal   Collection Time    04/24/14  9:33 PM      Result Value Ref Range   Glucose-Capillary 140 (*) 70 - 99 mg/dL  GLUCOSE, CAPILLARY     Status: Abnormal   Collection Time    04/25/14  7:58 AM      Result Value Ref Range   Glucose-Capillary 101 (*) 70 - 99 mg/dL  GLUCOSE, CAPILLARY     Status: Abnormal   Collection Time    04/25/14 11:31 AM      Result Value Ref Range   Glucose-Capillary 134 (*) 70 - 99 mg/dL   Comment 1 Notify RN    GLUCOSE, CAPILLARY     Status: Abnormal   Collection Time    04/25/14  4:47 PM      Result Value Ref Range   Glucose-Capillary 132 (*) 70 - 99 mg/dL  GLUCOSE, CAPILLARY     Status: Abnormal   Collection Time    04/25/14 10:15 PM      Result Value Ref Range   Glucose-Capillary 105 (*) 70 - 99 mg/dL   Comment 1 Notify RN    GLUCOSE, CAPILLARY     Status: Abnormal   Collection Time    04/26/14  7:18 AM      Result Value Ref Range   Glucose-Capillary 104 (*) 70 - 99 mg/dL   Comment 1 Notify RN    GLUCOSE, CAPILLARY     Status: Abnormal   Collection Time    04/26/14 12:12 PM      Result Value Ref Range   Glucose-Capillary 139 (*) 70 - 99 mg/dL  GLUCOSE, CAPILLARY     Status: Abnormal   Collection Time    04/26/14  4:41 PM      Result Value Ref Range   Glucose-Capillary 149 (*) 70 - 99 mg/dL  GLUCOSE, CAPILLARY     Status: Abnormal   Collection Time    04/26/14  9:18 PM      Result Value Ref Range   Glucose-Capillary 197 (*) 70 - 99 mg/dL   Comment 1 Notify RN    GLUCOSE, CAPILLARY     Status: Abnormal   Collection Time    04/27/14  8:05 AM      Result Value Ref Range   Glucose-Capillary 107 (*) 70 - 99 mg/dL     HEENT: normal Cardio: RRR and no murmur Resp: CTA B/L and unlabored GI: BS positive and non distended Extremity:  Pulses positive and Edema R AKA distal, Left hallux valgus Skin:   Intact Neuro: Alert/Oriented and Abnormal Motor 3-/5 Bilateral deltoid, 5/5 Biceps and trips and grip, 4/5 HF, Left KE and Ankle Musc/Skel:  Other crepitus bilateral shoulder Gen NAD, Morbidly obese   Assessment/Plan: 1. Functional deficits secondary to Deconditioning after exacerbation of CHF which require 3+ hours per day of interdisciplinary therapy in a  comprehensive inpatient rehab setting. Physiatrist is providing close team supervision and 24 hour management of active medical problems listed below. Physiatrist and rehab team continue to assess barriers to discharge/monitor patient progress toward functional and medical goals. FIM: FIM - Bathing Bathing Steps Patient Completed: Chest;Right Arm;Abdomen;Right upper leg;Left upper leg;Left Arm Bathing: 3: Mod-Patient completes 5-7 89f 10 parts or 50-74%  FIM - Upper Body Dressing/Undressing Upper body dressing/undressing steps patient completed: Thread/unthread right sleeve of pullover shirt/dresss;Thread/unthread left sleeve of pullover shirt/dress;Thread/unthread right bra strap;Thread/unthread left bra strap;Put head through opening of pull over shirt/dress;Pull shirt over trunk Upper body dressing/undressing: 4: Min-Patient completed 75 plus % of tasks FIM - Lower Body Dressing/Undressing Lower body dressing/undressing steps patient completed: Thread/unthread left pants leg;Thread/unthread right pants leg Lower body dressing/undressing: 2: Max-Patient completed 25-49% of tasks  FIM - Toileting Toileting steps completed by patient: Performs perineal hygiene Toileting: 1: Total-Patient completed zero steps, helper did all 3  FIM - Radio producer Devices: Grab bars Toilet Transfers: 2-To toilet/BSC: Max A (lift and lower assist);2-From toilet/BSC: Max A (lift and lower assist)  FIM - Control and instrumentation engineer Devices: Sliding board;Arm rests Bed/Chair Transfer: 2: Bed > Chair or W/C: Max A (lift and lower assist);2: Chair or W/C > Bed: Max A (lift and lower assist)  FIM - Locomotion: Wheelchair Distance: 20 Locomotion: Wheelchair: 1: Travels less than 50 ft with moderate assistance (Pt: 50 - 74%) FIM - Locomotion: Ambulation Locomotion: Ambulation: 0: Activity did not occur  Comprehension Comprehension Mode: Auditory Comprehension:  5-Follows basic conversation/direction: With no assist  Expression Expression Mode: Verbal Expression: 5-Expresses basic needs/ideas: With no assist  Social Interaction Social Interaction: 6-Interacts appropriately with others with medication or extra time (anti-anxiety, antidepressant).  Problem Solving Problem Solving: 5-Solves basic 90% of the time/requires cueing < 10% of the time  Memory Memory: 5-Recognizes or recalls 90% of the time/requires cueing < 10% of the time  Medical Problem List and Plan:  1. Functional deficits secondary to Multi-medical/Deconditioning/history right AKA September 2012  2. DVT Prophylaxis/Anticoagulation: Subcutaneous Lovenox. Monitor platelet counts any signs of bleeding  3. Pain Management: Tylenol as needed , Left foot pain hallux valgus diabetic sandal needed 4. Mood/depression: Celexa 10 mg daily. Provide emotional support  5. Neuropsych: This patient is capable of making decisions on her own behalf.  6. Diastolic congestive heart failure. Lasix 60 mg twice a day. Monitor for any signs of fluid overload  7. Hypertension.  Norvasc 10 mg daily, Coreg 3.125 mg twice a day, hydralazine 100 mg 3 times a day, Imdur 120 mg daily. Monitor with increased mobility  8. Diabetes mellitus with peripheral neuropathy. Hemoglobin A1c 6.4. Continue sliding scale insulin. Sugars under fair control at present 9. Chronic renal insufficiency. Baseline creatinine 2.02. Followup chemistries  10. Chronic anemia. Continue Niferex.Microcytic indices Followup PCP 11. Hyperlipidemia. Zetia  12. History of gout. Zyloprim. Monitor for any gout flare ups  13. GERD. Pepcid  14. COPD. Compensated has some rhinitis Singulair 10 mg daily    LOS (Days) 6 A FACE TO FACE EVALUATION WAS PERFORMED  SWARTZ,ZACHARY T 04/27/2014, 9:39 AM

## 2014-04-27 NOTE — Progress Notes (Signed)
Physical Therapy Session Note  Patient Details  Name: [name] MRN: [id number] Date of Birth: [birth year]  Today's Date: 04/27/2014 Time: 1300-1400 Time Calculation (min): 60 min  Short Term Goals: Week 1:  PT Short Term Goal 1 (Week 1): = LTGS due to anticipated LOS  Skilled Therapeutic Interventions/Progress Updates:   Pt received sitting in w/c in room, agreeable to therapy.  Assisted to therapy gym in w/c at total A level.   Performed SB transfer to/from nustep at min/mod A level.  Continued cues for increased forward trunk lean for improved buttocks clearance and also for hand placement to maintain beside of trunk during transfer.  Performed seated nustep x 6 mins with LLE and BUEs at level 2 resistance to increase activity tolerance and overall strengthening.  Pt unable to continue with activity due to increased L knee pain and therapist wanting to attempt standing again in // bars.  Performed 4 reps of "lift offs" in // bars, however not able to perform full stand due to weakness and pain in LLE/BUEs.  Ended session with transfer to/from therapy mat in order to perform supine therex; SLR x 10 reps BLEs, glute sets x 10 reps, SL hip abd x 10 reps BLE, SL hip ext x 10 reps BLE.  Transferred back to chair as stated above with SB. Assisted back to room and left in w/c with all needs in reach.   Therapy Documentation Precautions:  Precautions Precautions: Fall;Other (comment) Precaution Comments: R AKA, doesn't stand or walk Restrictions Weight Bearing Restrictions: No   Pain: Pt with improved pain in L knee from am session.      See FIM for current functional status  Therapy/Group: Individual Therapy  Denice Bors 04/27/2014, 1:56 PM

## 2014-04-27 NOTE — Progress Notes (Signed)
Occupational Therapy Session Note  Patient Details  Name: [name] MRN: [id number] Date of Birth: May 22, 1927  Today's Date: 04/27/2014 Time:  -   0900-1000  (60 min)    Short Term Goals: Week 1:  OT Short Term Goal 1 (Week 1): UB bathing/dressing w/ Min A seated  OT Short Term Goal 1 - Progress (Week 1): Progressing toward goal OT Short Term Goal 2 (Week 1): LB bathe/dress Mod A w/ A/E PRN OT Short Term Goal 2 - Progress (Week 1): Progressing toward goal OT Short Term Goal 3 (Week 1): Toilet transfer w/ Min A w/c to drop arm 3:1 OT Short Term Goal 3 - Progress (Week 1): Progressing toward goal OT Short Term Goal 4 (Week 1): Pt will be Min A w/ A/E use during ADL's and compensatory techniques secondary to decreased functional use/arthritis bilateral UE's (R>L). OT Short Term Goal 4 - Progress (Week 1): Progressing toward goal OT Short Term Goal 5 (Week 1): Lateral scoot/squat pivot transfers Min A OT Short Term Goal 5 - Progress (Week 1): Progressing toward goal Week 2:     R AKA    Skilled therapeutic Interventions/Progress updates:   Pt. Lying in bed upon OT arrival.  Rolled to right and came up to EOB with minimal assist.  Pt. Propped left foot on wc while OT washed and cleaned toenail of left foot.  Pt. Went from sitting to supine with min assist to wash panniculeus and apply powder.  Pt was mod assist with this due to extensive folds.  Pt sat EOB and transferred to wc with mod assist with sliding board .  Left in wc with RN providing care.      Therapy Documentation Precautions:  Precautions Precautions: Fall;Other (comment) Precaution Comments: R AKA, doesn't stand or walk Restrictions Weight Bearing Restrictions: No   Pain:  Bilateral shoulder pain  And right knee pain  4/10    See FIM for current functional status  Therapy/Group: Individual Therapy  Lisa Roca 04/27/2014, 10:22 AM

## 2014-04-27 NOTE — Progress Notes (Signed)
Orthopedic Tech Progress Note Patient Details:  [name] [birth year] [id number] Advanced called to place brace order Patient ID: [name], female   DOB: 1926-11-26, 80 y.o.   MRN: [id number]   Fenton Foy 04/27/2014, 10:33 AM

## 2014-04-27 NOTE — Progress Notes (Signed)
Occupational Therapy Session Note  Patient Details  Name: [name] MRN: [id number] Date of Birth: May 04, 1927  Today's Date: 04/27/2014 Time: 4580-9983 Time Calculation (min): 31 min  Skilled Therapeutic Interventions/Progress Updates:    Pt went down to the gym and worked on [employer] transfers from wheelchair to mat with max assist, at a slight incline.  She need max demonstrational cueing for sequencing transfer with emphasis on forward trunk flexion to assist with sliding board transfers and to increase use of the LLE.  Pt tends to position UEs too far outside of her base to use effectively so needs constant cueing to bring them in closer to her.  Also worked on scooting up and down the mat and then back to the wheelchair.  She is able to transfer to the wheelchair, slightly downhill with min assist but needs max assist for slightly uphill transfer.    Therapy Documentation Precautions:  Precautions Precautions: Fall;Other (comment) Precaution Comments: R AKA, doesn't stand or walk Restrictions Weight Bearing Restrictions: No  Pain: Pain Assessment Pain Assessment: No/denies pain ADL:  See FIM for current functional status  Therapy/Group: Individual Therapy  Tewana Bohlen OTR/L 04/27/2014, 4:10 PM

## 2014-04-27 NOTE — Progress Notes (Signed)
Physical Therapy Session Note  Patient Details  Name: [name] MRN: [id number] Date of Birth: [birth year]  Today's Date: 04/27/2014 Time: 1120-1201 Time Calculation (min): 41 min  Short Term Goals: Week 1:  PT Short Term Goal 1 (Week 1): = LTGS due to anticipated LOS  Skilled Therapeutic Interventions/Progress Updates:   Pt received sitting in w/c in room, agreeable to therapy.  Note Darco boot on LLE to protect foot.  Pt verbalizes that pain is improved with this shoe.  Assisted to/from therapy gym in w/c with total A.  Attempted small lift offs with use of // bars, however pt with increased knee pain, therefore unable to fully clear buttocks.  Remainder of session focused on SB transfers to varying level surfaces to better simulate transfers in home environment.  Discussed during session that if transferring to/from higher surfaces, may need to place book under foot to allow for increase push through LLE and also will provide dysum to prevent sliding.  Performed SB transfer x 4 reps with min to mod A.  Mod A for uphill to the R and downhill to the L.  Continue to recommend that she use SB at home for increased safety and independence.  Pt assisted back to w/c and back to room.  Assisted with rubbing pain relieving cream on L knee to assist with pain.  Pt left in w/c with all needs in reach.   Therapy Documentation Precautions:  Precautions Precautions: Fall;Other (comment) Precaution Comments: R AKA, doesn't stand or walk Restrictions Weight Bearing Restrictions: No   Pain: Pt with mod c/o pain in L knee during SB transfers.     See FIM for current functional status  Therapy/Group: Individual Therapy  Denice Bors 04/27/2014, 12:23 PM

## 2014-04-28 ENCOUNTER — Inpatient Hospital Stay (HOSPITAL_COMMUNITY): Payer: Medicare Other | Admitting: Rehabilitation

## 2014-04-28 ENCOUNTER — Inpatient Hospital Stay (HOSPITAL_COMMUNITY): Payer: Medicare Other | Admitting: Occupational Therapy

## 2014-04-28 ENCOUNTER — Inpatient Hospital Stay (HOSPITAL_COMMUNITY): Payer: Medicare Other

## 2014-04-28 LAB — GLUCOSE, CAPILLARY
GLUCOSE-CAPILLARY: 137 mg/dL — AB (ref 70–99)
Glucose-Capillary: 96 mg/dL (ref 70–99)
Glucose-Capillary: 129 mg/dL — ABNORMAL HIGH (ref 70–99)
Glucose-Capillary: 172 mg/dL — ABNORMAL HIGH (ref 70–99)

## 2014-04-28 LAB — CREATININE, SERUM
Creatinine, Ser: 1.98 mg/dL — ABNORMAL HIGH (ref 0.50–1.10)
GFR calc non Af Amer: 22 mL/min — ABNORMAL LOW (ref >90)
GFR, EST AFRICAN AMERICAN: 25 mL/min — AB (ref >90)

## 2014-04-28 MED ORDER — GUAIFENESIN 100 MG/5ML PO SYRP
200.0000 mg | ORAL_SOLUTION | ORAL | Status: DC | PRN
  Administered 2014-05-01: 100 mg via ORAL
  Filled 2014-04-28: qty 10

## 2014-04-28 NOTE — Progress Notes (Signed)
80 y.o. right-handed female with history of diabetes mellitus with peripheral neuropathy, diastolic congestive heart failure, CAD, hypertension and chronic renal insufficiency with creatinine baseline 2.02 and history of right AKA September 2012 and received inpatient rehabilitation services and prosthesis provided by [employer] [facility]. Patient primarily wheelchair bound since January and limited use of prosthesis and assistance of her granddaughter and hired caregivers. Presented 04/16/2014 with complaints of worsening shortness of breath for 2 days as well as nonproductive cough. Noted hypoxemia of 88% in the ED was placed on 3 L oxygen. Chest x-ray showed pulmonary edema small bilateral pleural effusions. She was administered intravenous Lasix. Noted hyperkalemia 5.7 received Kayexalate. Complaints of right shoulder pain with x-rays negative for fracture but did show severe osteoarthritic changes in the glenohumeral joint  Subjective/Complaints: Complains of a little cough, slightly productive Review of Systems - Negative except joint pain   Objective: Vital Signs: Blood pressure 170/63, pulse 70, temperature 98.9 F (37.2 C), temperature source Oral, resp. rate 18, height 5' 2"  (1.575 m), weight 81.784 kg (180 lb 4.8 oz), SpO2 94.00%. No results found. Results for orders placed during the hospital encounter of 04/21/14 (from the past 72 hour(s))  GLUCOSE, CAPILLARY     Status: Abnormal   Collection Time    04/25/14 11:31 AM      Result Value Ref Range   Glucose-Capillary 134 (*) 70 - 99 mg/dL   Comment 1 Notify RN    GLUCOSE, CAPILLARY     Status: Abnormal   Collection Time    04/25/14  4:47 PM      Result Value Ref Range   Glucose-Capillary 132 (*) 70 - 99 mg/dL  GLUCOSE, CAPILLARY     Status: Abnormal   Collection Time    04/25/14 10:15 PM      Result Value Ref Range   Glucose-Capillary 105 (*) 70 - 99 mg/dL   Comment 1 Notify RN    GLUCOSE, CAPILLARY     Status: Abnormal    Collection Time    04/26/14  7:18 AM      Result Value Ref Range   Glucose-Capillary 104 (*) 70 - 99 mg/dL   Comment 1 Notify RN    GLUCOSE, CAPILLARY     Status: Abnormal   Collection Time    04/26/14 12:12 PM      Result Value Ref Range   Glucose-Capillary 139 (*) 70 - 99 mg/dL  GLUCOSE, CAPILLARY     Status: Abnormal   Collection Time    04/26/14  4:41 PM      Result Value Ref Range   Glucose-Capillary 149 (*) 70 - 99 mg/dL  GLUCOSE, CAPILLARY     Status: Abnormal   Collection Time    04/26/14  9:18 PM      Result Value Ref Range   Glucose-Capillary 197 (*) 70 - 99 mg/dL   Comment 1 Notify RN    GLUCOSE, CAPILLARY     Status: Abnormal   Collection Time    04/27/14  8:05 AM      Result Value Ref Range   Glucose-Capillary 107 (*) 70 - 99 mg/dL  GLUCOSE, CAPILLARY     Status: Abnormal   Collection Time    04/27/14 12:00 PM      Result Value Ref Range   Glucose-Capillary 128 (*) 70 - 99 mg/dL  GLUCOSE, CAPILLARY     Status: Abnormal   Collection Time    04/27/14  5:03 PM      Result Value  Ref Range   Glucose-Capillary 117 (*) 70 - 99 mg/dL  GLUCOSE, CAPILLARY     Status: Abnormal   Collection Time    04/27/14 10:14 PM      Result Value Ref Range   Glucose-Capillary 156 (*) 70 - 99 mg/dL   Comment 1 Notify RN    CREATININE, SERUM     Status: Abnormal   Collection Time    04/28/14  6:05 AM      Result Value Ref Range   Creatinine, Ser 1.98 (*) 0.50 - 1.10 mg/dL   GFR calc non Af Amer 22 (*) >90 mL/min   GFR calc Af Amer 25 (*) >90 mL/min   Comment: (NOTE)     The eGFR has been calculated using the CKD EPI equation.     This calculation has not been validated in all clinical situations.     eGFR's persistently <90 mL/min signify possible Chronic Kidney     Disease.  GLUCOSE, CAPILLARY     Status: None   Collection Time    04/28/14  7:48 AM      Result Value Ref Range   Glucose-Capillary 96  70 - 99 mg/dL     HEENT: normal Cardio: RRR and no murmur Resp: CTA  B/L and unlabored. No wheezes GI: BS positive and non distended Extremity:  Pulses positive and Edema R AKA distal, Left hallux valgus Skin:   Intact Neuro: Alert/Oriented and Abnormal Motor 3-/5 Bilateral deltoid, 5/5 Biceps and trips and grip, 4/5 HF, Left KE and Ankle Musc/Skel:  Other crepitus bilateral shoulder Gen NAD, Morbidly obese   Assessment/Plan: 1. Functional deficits secondary to Deconditioning after exacerbation of CHF which require 3+ hours per day of interdisciplinary therapy in a comprehensive inpatient rehab setting. Physiatrist is providing close team supervision and 24 hour management of active medical problems listed below. Physiatrist and rehab team continue to assess barriers to discharge/monitor patient progress toward functional and medical goals. FIM: FIM - Bathing Bathing Steps Patient Completed: Chest;Right Arm;Abdomen;Right upper leg;Left upper leg;Left Arm Bathing: 3: Mod-Patient completes 5-7 27f10 parts or 50-74%  FIM - Upper Body Dressing/Undressing Upper body dressing/undressing steps patient completed: Thread/unthread right sleeve of pullover shirt/dresss;Thread/unthread left sleeve of pullover shirt/dress;Thread/unthread right bra strap;Thread/unthread left bra strap;Put head through opening of pull over shirt/dress;Pull shirt over trunk Upper body dressing/undressing: 4: Min-Patient completed 75 plus % of tasks FIM - Lower Body Dressing/Undressing Lower body dressing/undressing steps patient completed: Thread/unthread left pants leg;Thread/unthread right pants leg Lower body dressing/undressing: 1: Total-Patient completed less than 25% of tasks  FIM - Toileting Toileting steps completed by patient: Performs perineal hygiene Toileting: 0: Activity did not occur  FIM - TRadio producerDevices: GProduct managerTransfers: 0-Activity did not occur  FIM - BControl and instrumentation engineerDevices: Sliding  board;Arm rests Bed/Chair Transfer: 3: Sit > Supine: Mod A (lifting assist/Pt. 50-74%/lift 2 legs);3: Bed > Chair or W/C: Mod A (lift or lower assist)  FIM - Locomotion: Wheelchair Distance: 20 Locomotion: Wheelchair: 0: Activity did not occur FIM - Locomotion: Ambulation Locomotion: Ambulation: 0: Activity did not occur  Comprehension Comprehension Mode: Auditory Comprehension: 5-Follows basic conversation/direction: With no assist  Expression Expression Mode: Verbal Expression: 5-Expresses basic needs/ideas: With no assist  Social Interaction Social Interaction: 6-Interacts appropriately with others with medication or extra time (anti-anxiety, antidepressant).  Problem Solving Problem Solving: 5-Solves basic 90% of the time/requires cueing < 10% of the time  Memory Memory: 5-Recognizes or recalls 90%  of the time/requires cueing < 10% of the time  Medical Problem List and Plan:  1. Functional deficits secondary to Multi-medical/Deconditioning/history right AKA September 2012  2. DVT Prophylaxis/Anticoagulation: Subcutaneous Lovenox. Monitor platelet counts any signs of bleeding  3. Pain Management: Tylenol as needed , Left foot pain hallux valgus diabetic sandal needed 4. Mood/depression: Celexa 10 mg daily. Provide emotional support  5. Neuropsych: This patient is capable of making decisions on her own behalf.  6. Diastolic congestive heart failure. Lasix 60 mg twice a day. Weight ok. I's and O's +  -follow up cxr today given increased cough. 7. Hypertension. Norvasc 10 mg daily, Coreg 3.125 mg twice a day, hydralazine 100 mg 3 times a day, Imdur 120 mg daily. Monitor with increased mobility  8. Diabetes mellitus with peripheral neuropathy. Hemoglobin A1c 6.4. Continue sliding scale insulin. Sugars under better control at present 9. Chronic renal insufficiency. Baseline creatinine 2.02. Followup chemistries  10. Chronic anemia. Continue Niferex.Microcytic indices Followup  PCP 11. Hyperlipidemia. Zetia  12. History of gout. Zyloprim. Monitor for any gout flare ups  13. GERD. Pepcid  14. COPD. Compensated has some rhinitis Singulair 10 mg daily    LOS (Days) 7 A FACE TO FACE EVALUATION WAS PERFORMED  Ellenora Talton T 04/28/2014, 9:21 AM

## 2014-04-28 NOTE — Progress Notes (Signed)
Social Work Patient ID: [name], female   DOB: 06-Jun-1927, 80 y.o.   MRN: [id number] Left message with [name] to try to schedule family education prior to pt's discharge.  Will await return call.

## 2014-04-28 NOTE — Progress Notes (Signed)
Physical Therapy Session Note  Patient Details  Name: [name] MRN: [id number] Date of Birth: May 02, 1927  Today's Date: 04/28/2014 Time: 2263-3354 Time Calculation (min): 45 min  Short Term Goals: Week 1:  PT Short Term Goal 1 (Week 1): = LTGS due to anticipated LOS  Skilled Therapeutic Interventions/Progress Updates:   Pt received in restroom, nurse tech in room.  Pt able to perform peri care on her own with S.  Performed stand with use of grab bar and max A x 45 sec in order to don new brief, then pivoted into w/c with max A and therapist blocking LLE for increased safety and control with cues for technique and hand placement.  Assisted pt to/from therapy gym at total A level in order to work on functional transfers and scooting in order to carryover to transfers for increase independence and trunk strength.  Performed SB w/c<>mat with mod/max a and increased time to allow pt to work through amount of forward trunk lean needed to complete transfer.  While seated on EOM, had pt work on lateral scooting to carryover to improved transfers.  Performed with mod A progressing to min A.  Transferred back to w/c via SB with min A this time, however continues to require increased time to complete and verbal cues for hand placement and forward trunk lean.  Assisted back into room and left in w/c with all needs in reach.   Therapy Documentation Precautions:  Precautions Precautions: Fall;Other (comment) Precaution Comments: R AKA, doesn't stand or walk Restrictions Weight Bearing Restrictions: No   Vital Signs: Therapy Vitals Pulse Rate: 73 BP: 166/68 mmHg Pain: Pt with min c/o pain in L knee     See FIM for current functional status  Therapy/Group: Individual Therapy  Denice Bors 04/28/2014, 12:21 PM

## 2014-04-28 NOTE — Progress Notes (Signed)
Occupational Therapy Session Note  Patient Details  Name: [name] MRN: [id number] Date of Birth: 12-Sep-1927  Today's Date: 04/28/2014 Time: 1300-1330 Time Calculation (min): 30 min   Skilled Therapeutic Interventions/Progress Updates:    1:1 focus on basic transfers via slide board. Bed mobility with mod A for supine to sit without bed rail.  Transfer training via Slide board bed<w/c><mat with mod A with more than reasonable amt of time. Applied Dycem to the bottom of the Stanley for increased traction on the floor. Also practiced lateral scoots with increased bottom clearance to improve transfers.   Therapy Documentation Precautions:  Precautions Precautions: Fall;Other (comment) Precaution Comments: R AKA, doesn't stand or walk Restrictions Weight Bearing Restrictions: No Pain:  left great toe is sore- RN aware- donned new DAR[employer] shoe  See FIM for current functional status  Therapy/Group: Individual Therapy  Willeen Cass [facility] 04/28/2014, 2:13 PM

## 2014-04-28 NOTE — Progress Notes (Signed)
Occupational Therapy Session Note  Patient Details  Name: [name] MRN: [id number] Date of Birth: Jan 26, 1927  Today's Date: 04/28/2014 Time: 1100-1155 Time Calculation (min): 55 min  Short Term Goals: Week 1:  OT Short Term Goal 1 (Week 1): UB bathing/dressing w/ Min A seated  OT Short Term Goal 2 (Week 1): LB bathe/dress Mod A w/ A/E PRN OT Short Term Goal 3 (Week 1): Toilet transfer w/ Min A w/c to drop arm 3:1 OT Short Term Goal 4 (Week 1): Pt will be Min A w/ A/E use during ADL's and compensatory techniques secondary to decreased functional use/arthritis bilateral UE's (R>L). OT Short Term Goal 5 (Week 1): Lateral scoot/squat pivot transfers Min A  Skilled Therapeutic Interventions/Progress Updates:  Patient resting in w/c upon arrival and stating that she needed to use the restroom.  Performed scoot/squat pivot transfer w/c>commode with total assist (patient in a hurry secondary to loose stools) and use of grab bar.  While on commode, patient performed UB bath and dress as well as bathed peri area and buttocks following BM except she continued with loose stool several times following cleanup.  Patient reported right residual limb pain from sitting on commode as well as nausea therefore performed +2 (NT assisted) scoot/squat pivot to w/c then slide board to bed.  LB dressing not completed secondary to patient reporting continued nausea.  NT aware and to complete dressing after rest break.  NT to tell RN of nausea.  Therapy Documentation Precautions:  Precautions Precautions: Fall;Other (comment) Precaution Comments: R AKA, doesn't stand or walk Restrictions Weight Bearing Restrictions: No Pain: No report of pain ADL: See FIM for current functional status  Therapy/Group: Individual Therapy  SHAFFER, CHRISTINA 04/28/2014, 3:58 PM

## 2014-04-28 NOTE — Progress Notes (Signed)
Social Work Patient ID: [name], female   DOB: December 06, 1926, 80 y.o.   MRN: [id number] Spoke with [name] via telephone to discuss discharge plan of grandmother.  She reports pt is going to her home with her aunts. So they would need to come in For teaching she feels she and brother already know how to transfer her and care for her.  Have contacted Cynthia-pt's daughter to try to schedule family education prior To pt's discharge this Friday.  Await her return call.  [name] feels pt needs to go home via [place].  Will continue to work on discharge plans.

## 2014-04-28 NOTE — Progress Notes (Signed)
Physical Therapy Session Note  Patient Details  Name: [name] MRN: [id number] Date of Birth: [birth year]  Today's Date: 04/28/2014 Time: 4235-3614 Time Calculation (min): 57 min  Short Term Goals: Week 1:  PT Short Term Goal 1 (Week 1): = LTGS due to anticipated LOS  Skilled Therapeutic Interventions/Progress Updates:   Pt received sitting in room in w/c, agreeable to therapy session.  Skilled session focused on functional SB transfers w/c<> car for safe D/C, functional transfers w/c<> varying level of surfaces and scooting on EOM in order to improve SB transfers.  Also worked on forward weight shifts with small bench placed in front of pt to work on "lift off's."  Assisted pt to/from therapy gym in w/c at total A level.  Performed slideboard transfer w/c <>car at mod A.  Pt states home car will be about same height as w/c, therefore this is what was simulated during session.  Performed functional transfer to higher level surface at max A due to decreased ability to keep weight forward.  Once on EOM, worked on lateral scooting at min A level.  Note marked improvement with this task.  Also worked on 4 reps of lift offs at [employer] A. Pt initially did very well raises buttocks and almost stood all the way up, however as she fatigued and had increased knee pain, she was unable to lift as much.  Transferred back to w/c as stated above and back to room.  Left in w/c with all needs in reach.   Therapy Documentation Precautions:  Precautions Precautions: Fall;Other (comment) Precaution Comments: R AKA, doesn't stand or walk Restrictions Weight Bearing Restrictions: No Vital Signs: Therapy Vitals Temp: 97 F (36.1 C) Temp src: Oral Pulse Rate: 63 BP: 153/72 mmHg Patient Position (if appropriate): Lying Pain: Mild c/o pain in L knee, better with rest.        See FIM for current functional status  Therapy/Group: Individual Therapy  Denice Bors 04/28/2014, 4:59 PM

## 2014-04-29 ENCOUNTER — Inpatient Hospital Stay (HOSPITAL_COMMUNITY): Payer: Medicare Other

## 2014-04-29 ENCOUNTER — Inpatient Hospital Stay (HOSPITAL_COMMUNITY): Payer: Medicare Other | Admitting: Rehabilitation

## 2014-04-29 ENCOUNTER — Encounter (HOSPITAL_COMMUNITY): Payer: Medicare Other | Admitting: Occupational Therapy

## 2014-04-29 ENCOUNTER — Inpatient Hospital Stay (HOSPITAL_COMMUNITY): Payer: Medicare Other | Admitting: Physical Therapy

## 2014-04-29 DIAGNOSIS — I5031 Acute diastolic (congestive) heart failure: Secondary | ICD-10-CM

## 2014-04-29 DIAGNOSIS — R5381 Other malaise: Secondary | ICD-10-CM

## 2014-04-29 DIAGNOSIS — I10 Essential (primary) hypertension: Secondary | ICD-10-CM

## 2014-04-29 DIAGNOSIS — N184 Chronic kidney disease, stage 4 (severe): Secondary | ICD-10-CM

## 2014-04-29 DIAGNOSIS — E119 Type 2 diabetes mellitus without complications: Secondary | ICD-10-CM

## 2014-04-29 LAB — GLUCOSE, CAPILLARY
GLUCOSE-CAPILLARY: 129 mg/dL — AB (ref 70–99)
GLUCOSE-CAPILLARY: 130 mg/dL — AB (ref 70–99)
Glucose-Capillary: 92 mg/dL (ref 70–99)
Glucose-Capillary: 150 mg/dL — ABNORMAL HIGH (ref 70–99)

## 2014-04-29 MED ORDER — DOCUSATE SODIUM 100 MG PO CAPS
100.0000 mg | ORAL_CAPSULE | Freq: Every day | ORAL | Status: DC
  Administered 2014-04-30 – 2014-05-01 (×2): 100 mg via ORAL
  Filled 2014-04-29 (×3): qty 1

## 2014-04-29 MED ORDER — TRAMADOL HCL 50 MG PO TABS
50.0000 mg | ORAL_TABLET | Freq: Two times a day (BID) | ORAL | Status: DC | PRN
  Administered 2014-04-29 – 2014-04-30 (×2): 50 mg via ORAL
  Filled 2014-04-29 (×2): qty 1

## 2014-04-29 NOTE — Progress Notes (Signed)
Physical Therapy Session Note  Patient Details  Name: [name] MRN: [id number] Date of Birth: 26-Aug-1927  Today's Date: 04/29/2014 Time: 1884-1660 Time Calculation (min): 41 min  Short Term Goals: Week 1:  PT Short Term Goal 1 (Week 1): = LTGS due to anticipated LOS  Skilled Therapeutic Interventions/Progress Updates:   Pt received leaning over in bed, noted to be in some distress.  She states that she has had a bowel movement in the bed while waiting on staff to assist to bedside commode. Assisted with cleaning pt in preparation to transfer to bedside commode.  Pt then able to sit at EOB with min assist for scooting hips out to EOB.  Performed stand pivot transfer to bedside commode with granddaughter present to stabilize commode chair.  Pt with no further success on bedside commode following several mins, therefore stood with pt at max A while granddaughter assisted in peri care.  Pt then stood again in order to switch bedside commode for w/c.  Returned to w/c and assisted with donning DAR[employer] shoe.  Had lengthy discussion with granddaughter regarding PT's concerns with D/C.  Discussed that we need to have caregiver or pts daughters come in for family training (whomever will be assisting physically) as she is more assist now and with use of SB.  Also discussed pts scooter, as this is what she sits in most of day.  Granddaughter able to call family in order to have them bring it to hospital.  Discussed whether or not pt has grab bar in restroom.  Also discussed that if pt going to perform standing for peri care or bathing/dressing that it would need to be with 2 people (granddaughter is nurse and is aware of how to manage pt).  Also discussed form of transportation home, will continue to assess with CSW.  Pt left in w/c with all needs in reach.    Therapy Documentation Precautions:  Precautions Precautions: Fall;Other (comment) Precaution Comments: R AKA, doesn't stand or  walk Restrictions Weight Bearing Restrictions: No   Pain:No pain stated during session.      See FIM for current functional status  Therapy/Group: Individual Therapy  Denice Bors 04/29/2014, 12:22 PM

## 2014-04-29 NOTE — Progress Notes (Signed)
Occupational Therapy Note  Patient Details  Name: [name] MRN: [id number] Date of Birth: 1927/11/04 Today's Date: 04/29/2014  Time: 1445-1520 Pt c/o bilateral shoulder discomfort (unrated); RN aware and pt stated that "cream" applied before therapy that helped Individual Therapy  Pt seated in w/c upon arrival.  Pt engaged in BUE therex with 1kg weighted ball (PNF, shoulder presses) 3 sets of 10 each with rest breaks and biceps/triceps exercises with 1.5# dumbbells. Focus on increased BUE strength and activity tolerance.  [name] [facility] 04/29/2014, 3:46 PM

## 2014-04-29 NOTE — Plan of Care (Signed)
Problem: RH Bed to Chair Transfers Goal: LTG Patient will perform bed/chair transfers w/assist (PT) LTG: Patient will perform bed/chair transfers with assistance, with/without cues (PT).  Downgraded all mobility goals to mod A, as pt demonstrates varying levels of assist needed during SB transfers.

## 2014-04-29 NOTE — Progress Notes (Signed)
Occupational Therapy Note  Patient Details  Name: [name] MRN: [id number] Date of Birth: 1927-03-31 Today's Date: 04/29/2014  Time In:  1030  Time Out: 1110.  Individual session, no c/o pain.  Treatment session focused on ADL session with emphasis on increasing initiation and independence, increasing activity tolerance, sit to stand x2, sitting balance.  Pt's daughter present for last 10 minutes of session and provided update to daughter on patient's status.   Quay Burow 04/29/2014, 11:28 AM

## 2014-04-29 NOTE — Progress Notes (Signed)
Social Work Patient ID: [name], female   DOB: Aug 21, 1927, 80 y.o.   MRN: [id number] Met with pt and [name] to discuss pt's care at home and how she has been doing it, but her Elenor Legato has not and would need to come in For family education.  Plan for pt to go to her handicapped accessible apartment with her CAP worker and her daughter's assisting. Discussing options of [facility] or [facility] to transport home on Friday.  Awaiting pt's daughter to contact me regarding scheduling family education for tomorrow.

## 2014-04-29 NOTE — Progress Notes (Signed)
80 y.o. right-handed female with history of diabetes mellitus with peripheral neuropathy, diastolic congestive heart failure, CAD, hypertension and chronic renal insufficiency with creatinine baseline 2.02 and history of right AKA September 2012 and received inpatient rehabilitation services and prosthesis provided by [employer] [facility]. Patient primarily wheelchair bound since January and limited use of prosthesis and assistance of her granddaughter and hired caregivers. Presented 04/16/2014 with complaints of worsening shortness of breath for 2 days as well as nonproductive cough. Noted hypoxemia of 88% in the ED was placed on 3 L oxygen. Chest x-ray showed pulmonary edema small bilateral pleural effusions. She was administered intravenous Lasix. Noted hyperkalemia 5.7 received Kayexalate. Complaints of right shoulder pain with x-rays negative for fracture but did show severe osteoarthritic changes in the glenohumeral joint  Subjective/Complaints: 3 loose stools yest No bowel or bladder inc, no abd pain, no SOB Standing less painful in Darco sandal Review of Systems - Negative except joint pain left knee and Left foot  Objective: Vital Signs: Blood pressure 131/60, pulse 69, temperature 98.9 F (37.2 C), temperature source Oral, resp. rate 18, height _0  (1.575 m), weight 81.829 kg (180 lb 6.4 oz), SpO2 95.00%. Dg Chest 2 View  04/28/2014   CLINICAL DATA:  Increased productive cough, hypertension, diabetes, CHF, COPD, coronary artery disease  EXAM: CHEST  2 VIEW  COMPARISON:  04/18/2014  FINDINGS: Enlargement of cardiac silhouette post CABG.  Calcified tortuous thoracic aorta.  Slight rotation to the RIGHT.  Pulmonary vascular congestion.  Improved pulmonary edema since previous exam.  Bibasilar atelectasis.  No pleural effusion or pneumothorax.  Advanced BILATERAL glenohumeral degenerative changes and LEFT chronic rotator cuff tear.  IMPRESSION: Improved CHF with persistent mild edema and  bibasilar atelectasis.   Electronically Signed   By: Lavonia Dana M.D.   On: 04/28/2014 15:18   Results for orders placed during the hospital encounter of 04/21/14 (from the past 72 hour(s))  GLUCOSE, CAPILLARY     Status: Abnormal   Collection Time    04/26/14 12:12 PM      Result Value Ref Range   Glucose-Capillary 139 (*) 70 - 99 mg/dL  GLUCOSE, CAPILLARY     Status: Abnormal   Collection Time    04/26/14  4:41 PM      Result Value Ref Range   Glucose-Capillary 149 (*) 70 - 99 mg/dL  GLUCOSE, CAPILLARY     Status: Abnormal   Collection Time    04/26/14  9:18 PM      Result Value Ref Range   Glucose-Capillary 197 (*) 70 - 99 mg/dL   Comment 1 Notify RN    GLUCOSE, CAPILLARY     Status: Abnormal   Collection Time    04/27/14  8:05 AM      Result Value Ref Range   Glucose-Capillary 107 (*) 70 - 99 mg/dL  GLUCOSE, CAPILLARY     Status: Abnormal   Collection Time    04/27/14 12:00 PM      Result Value Ref Range   Glucose-Capillary 128 (*) 70 - 99 mg/dL  GLUCOSE, CAPILLARY     Status: Abnormal   Collection Time    04/27/14  5:03 PM      Result Value Ref Range   Glucose-Capillary 117 (*) 70 - 99 mg/dL  GLUCOSE, CAPILLARY     Status: Abnormal   Collection Time    04/27/14 10:14 PM      Result Value Ref Range   Glucose-Capillary 156 (*) 70 - 99 mg/dL  Comment 1 Notify RN    CREATININE, SERUM     Status: Abnormal   Collection Time    04/28/14  6:05 AM      Result Value Ref Range   Creatinine, Ser 1.98 (*) 0.50 - 1.10 mg/dL   GFR calc non Af Amer 22 (*) >90 mL/min   GFR calc Af Amer 25 (*) >90 mL/min   Comment: (NOTE)     The eGFR has been calculated using the CKD EPI equation.     This calculation has not been validated in all clinical situations.     eGFR's persistently <90 mL/min signify possible Chronic Kidney     Disease.  GLUCOSE, CAPILLARY     Status: None   Collection Time    04/28/14  7:48 AM      Result Value Ref Range   Glucose-Capillary 96  70 - 99 mg/dL   GLUCOSE, CAPILLARY     Status: Abnormal   Collection Time    04/28/14 11:44 AM      Result Value Ref Range   Glucose-Capillary 129 (*) 70 - 99 mg/dL   Comment 1 Notify RN    GLUCOSE, CAPILLARY     Status: Abnormal   Collection Time    04/28/14  5:11 PM      Result Value Ref Range   Glucose-Capillary 137 (*) 70 - 99 mg/dL  GLUCOSE, CAPILLARY     Status: Abnormal   Collection Time    04/28/14  8:41 PM      Result Value Ref Range   Glucose-Capillary 172 (*) 70 - 99 mg/dL  GLUCOSE, CAPILLARY     Status: None   Collection Time    04/29/14  7:39 AM      Result Value Ref Range   Glucose-Capillary 92  70 - 99 mg/dL     HEENT: normal Cardio: RRR and no murmur Resp: CTA B/L and unlabored. No wheezes GI: BS positive and non distended Extremity:  Pulses positive and Edema R AKA distal, Left hallux valgus Skin:   Intact Neuro: Alert/Oriented and Abnormal Motor 3-/5 Bilateral deltoid, 5/5 Biceps and trips and grip, 4/5 HF, Left KE and Ankle Musc/Skel:  Other crepitus bilateral shoulder Gen NAD, Morbidly obese   Assessment/Plan: 1. Functional deficits secondary to Deconditioning after exacerbation of CHF which require 3+ hours per day of interdisciplinary therapy in a comprehensive inpatient rehab setting. Physiatrist is providing close team supervision and 24 hour management of active medical problems listed below. Physiatrist and rehab team continue to assess barriers to discharge/monitor patient progress toward functional and medical goals. FIM: FIM - Bathing Bathing Steps Patient Completed: Chest;Right Arm;Abdomen;Right upper leg;Left upper leg;Left Arm Bathing: 3: Mod-Patient completes 5-7 21f10 parts or 50-74%  FIM - Upper Body Dressing/Undressing Upper body dressing/undressing steps patient completed: Thread/unthread right sleeve of pullover shirt/dresss;Thread/unthread left sleeve of pullover shirt/dress;Put head through opening of pull over shirt/dress Upper body  dressing/undressing: 2: Max-Patient completed 25-49% of tasks FIM - Lower Body Dressing/Undressing Lower body dressing/undressing steps patient completed: Thread/unthread left pants leg;Thread/unthread right pants leg Lower body dressing/undressing:  (not completed due to nausea and bowel issues)  FIM - Toileting Toileting steps completed by patient: Performs perineal hygiene Toileting Assistive Devices: Grab bar or rail for support Toileting: 2: Max-Patient completed 1 of 3 steps  FIM - TRadio producerDevices: GProduct managerTransfers: 1-Two helpers  FIM - BControl and instrumentation engineerDevices: Sliding board;Arm rests Bed/Chair Transfer: 3: Bed >  Chair or W/C: Mod A (lift or lower assist);3: Chair or W/C > Bed: Mod A (lift or lower assist)  FIM - Locomotion: Wheelchair Distance: 20 Locomotion: Wheelchair: 0: Activity did not occur FIM - Locomotion: Ambulation Locomotion: Ambulation: 0: Activity did not occur  Comprehension Comprehension Mode: Auditory Comprehension: 5-Follows basic conversation/direction: With extra time/assistive device  Expression Expression Mode: Verbal Expression: 7-Expresses complex ideas: With no assist  Social Interaction Social Interaction: 6-Interacts appropriately with others with medication or extra time (anti-anxiety, antidepressant).  Problem Solving Problem Solving: 5-Solves basic 90% of the time/requires cueing < 10% of the time  Memory Memory: 5-Recognizes or recalls 90% of the time/requires cueing < 10% of the time  Medical Problem List and Plan:  1. Functional deficits secondary to Multi-medical/Deconditioning/history right AKA September 2012  2. DVT Prophylaxis/Anticoagulation: Subcutaneous Lovenox. Monitor platelet counts any signs of bleeding  3. Pain Management: Tylenol as needed , Left foot pain hallux valgus diabetic sandal needed, knee OA voltaren gel 4. Mood/depression: Celexa 10  mg daily. Provide emotional support  5. Neuropsych: This patient is capable of making decisions on her own behalf.  6. Diastolic congestive heart failure. Lasix 60 mg twice a day. Weight ok. I's and O's +  -follow up cxr today given increased cough. 7. Hypertension. Norvasc 10 mg daily, Coreg 3.125 mg twice a day, hydralazine 100 mg 3 times a day, Imdur 120 mg daily. Monitor with increased mobility  8. Diabetes mellitus with peripheral neuropathy. Hemoglobin A1c 6.4. Continue sliding scale insulin. Sugars under better control at present 9. Chronic renal insufficiency. Baseline creatinine 2.02. Followup chemistries  10. Chronic anemia. Continue Niferex.Microcytic indices Followup PCP 11. Hyperlipidemia. Zetia  12. History of gout. Zyloprim. Monitor for any gout flare ups  13. GERD. Pepcid  14. COPD. Compensated has some rhinitis Singulair 10 mg daily  15.  Loose stools none since yest, check Meds   LOS (Days) 8 A FACE TO FACE EVALUATION WAS PERFORMED  KIRSTEINS,ANDREW E 04/29/2014, 10:25 AM

## 2014-04-29 NOTE — Progress Notes (Signed)
Physical Therapy Session Note  Patient Details  Name: [name] MRN: [id number] Date of Birth: 09-21-1927  Today's Date: 04/29/2014 Time: 3716-9678 Time Calculation (min): 53 min  Short Term Goals: Week 1:  PT Short Term Goal 1 (Week 1): = LTGS due to anticipated LOS  Skilled Therapeutic Interventions/Progress Updates:   Pt received in w/c.  Pt fatigued but willing to participate.  Pt transported to gym in w/c total A.  Performed uneven slideboard transfer training w/c <> mat uphill to L and R with max A overall over the back technique to facilitate anterior/lateral lean for use of head hips relationship to laterally scoot.  Once on mat attempted sit > stand from elevated mat x 4 reps with UE support on back of w/c; pt only able to achieve full L knee extension on 2 attempts but unable to fully extend hip and trunk to stand fully upright; pt felt off balance with DARCO shoe on.  Continued lateral scooting training around edge of mat with focus on full anterior weight shift, transition to squat prior to scooting with max A.  Returned to room and set up with a snack.    Therapy Documentation Precautions:  Precautions Precautions: Fall;Other (comment) Precaution Comments: R AKA, doesn't stand or walk Restrictions Weight Bearing Restrictions: No Vital Signs: Therapy Vitals Temp: 98.1 F (36.7 C) Temp src: Oral Pulse Rate: 58 Resp: 18 BP: 144/66 mmHg Patient Position (if appropriate): Sitting Oxygen Therapy SpO2: 97 % O2 Device: None (Room air) Pain: Pain Assessment Pain Assessment: 0-10 Pain Score: 4   See FIM for current functional status  Therapy/Group: Individual Therapy  Raylene Everts [facility] 04/29/2014, 4:52 PM

## 2014-04-29 NOTE — Patient Care Conference (Signed)
Inpatient RehabilitationTeam Conference and Plan of Care Update Date: 04/29/2014   Time: 11;50 Am    Patient Name: [name]      Medical Record Number: [id number]  Date of Birth: 03-03-1927 Sex: Female         Room/Bed: 4W07C/4W07C-01 Payor Info: Payor: MEDICARE / Plan: MEDICARE PART A AND B / Product Type: *No Product type* /    Admitting Diagnosis: Deconditioned   Admit Date/Time:  04/21/2014  4:34 PM Admission Comments: No comment available   Primary Diagnosis:  <principal problem not specified> Principal Problem: <principal problem not specified>  Patient Active Problem List   Diagnosis Date Noted  . Hyperkalemia 04/21/2014  . Sick-euthyroid syndrome 04/21/2014  . CHF (congestive heart failure) 04/16/2014  . C. difficile colitis 06/26/2012  . Acute on chronic renal failure 06/24/2012  . Pneumonia 06/23/2012  . Acute diastolic CHF (congestive heart failure) 06/22/2012  . SIRS (systemic inflammatory response syndrome) 06/22/2012  . DM2 (diabetes mellitus, type 2) 06/22/2012  . CKD (chronic kidney disease), stage IV 06/22/2012  . HTN (hypertension) 06/22/2012  . Breast cancer 06/22/2012  . PVD (peripheral vascular disease) 06/22/2012  . COPD (chronic obstructive pulmonary disease) 06/22/2012  . S/P AKA (above knee amputation) 08/28/2011  . Atherosclerosis of native arteries of the extremities with ulceration 07/24/2011    Expected Discharge Date: Expected Discharge Date: 05/01/14  Team Members Present: Physician leading conference: Dr. Alysia Penna Social Worker Present: Ovidio Kin, LCSW Nurse Present: Elliot Cousin, RN PT Present: Cameron Sprang, PT OT Present: Antony Salmon, OT SLP Present: Other (comment);Gunnar Fusi, SLP Elmyra Ricks Page-SP) PPS Coordinator present : Ileana Ladd, Lelan Pons, RN, [facility]     Current Status/Progress Goal Weekly Team Focus  Medical   deconditioning arm weakness, Left foot pain, Left knee pain  downgrade goals to Mod A   Left knee pain, left foot pain   Bowel/Bladder   Continent of bowel and bladder, calls for assistance LBM 04/28/2014 colace 100mg  and Senokot 1702  Remain continent of bowel and bladder, remain free of skin breakdown and infection.  Assist with regular tolileting. Monitor for constipation.   Swallow/Nutrition/ Hydration   wfl     ADL's   R & L w/ fine motor impairments, right hand w/ arthritis, paresthesias/impaired sensibility and inability to perform full fist. R UE AROM for shoulder flexion approximately 80* at best, ~95-100* LUE. Significant crepitus bilateral UE's, R > L impacting WB and ability to assist w/ ADL's and transfers. Severe osteoarthritic changes in the glenohumeral joint right.   max A for toileting, min A with LB done in the bed, bathing in bed withmod A, transfers with mod A  min A for toileting, min A with LB done in the bed, bathing in bed with min A, transfers with min A    Mobility   Pt is currently mod/max assist for bed mobility (better with use of bed rails and HOB elevated), min to mod with SB transfers, however if going uphill can require up to max assist.  Have done "lift off's" with UE support wtih max A.   S overall, however will need to downgrade to at least Min A.   functional SB transfers, strengthening, pt/family training   Communication     [facility]        Safety/Cognition/ Behavioral Observations    no unsafe behaviors        Pain   Tylenol 650 mg Q6h, Zostrix cream to shoulders. Pain rated 5 on scale 0-10.  Pain score <3  Assess pain Q shift and prn.   Skin   MASD to right abdominal folds microguard powder applied, bunion Left great toe.  No infection and increases skin breakdown during rehab.  Assess skin Q shift.      *See Care Plan and progress notes for long and short-term goals.  Barriers to Discharge: prior  R AKA    Possible Resolutions to Barriers:  CAP worker to be trained    Discharge Planning/Teaching Needs:  Home with daughter's and CAP  worker, aware of 24 hr care is needed.      Team Discussion:  Off O2, need family education with daughter, granddaughter feels knows her care needs, has been providing it prior to admission.  Family bringing scooter in to try with transfers, prior to discharge.  Revisions to Treatment Plan:  Downgraded goals to mod/min level.  Need family education   Continued Need for Acute Rehabilitation Level of Care: The patient requires daily medical management by a physician with specialized training in physical medicine and rehabilitation for the following conditions: Daily direction of a multidisciplinary physical rehabilitation program to ensure safe treatment while eliciting the highest outcome that is of practical value to the patient.: Yes Daily medical management of patient stability for increased activity during participation in an intensive rehabilitation regime.: Yes Daily analysis of laboratory values and/or radiology reports with any subsequent need for medication adjustment of medical intervention for : Neurological problems;Other;Pulmonary problems;Cardiac problems  Elease Hashimoto 04/30/2014, 3:50 PM

## 2014-04-30 ENCOUNTER — Inpatient Hospital Stay (HOSPITAL_COMMUNITY): Payer: Medicare Other | Admitting: Rehabilitation

## 2014-04-30 ENCOUNTER — Encounter (HOSPITAL_COMMUNITY): Payer: Medicare Other | Admitting: Occupational Therapy

## 2014-04-30 ENCOUNTER — Inpatient Hospital Stay (HOSPITAL_COMMUNITY): Payer: Medicare Other | Admitting: Occupational Therapy

## 2014-04-30 LAB — GLUCOSE, CAPILLARY
GLUCOSE-CAPILLARY: 104 mg/dL — AB (ref 70–99)
GLUCOSE-CAPILLARY: 165 mg/dL — AB (ref 70–99)
Glucose-Capillary: 144 mg/dL — ABNORMAL HIGH (ref 70–99)
Glucose-Capillary: 114 mg/dL — ABNORMAL HIGH (ref 70–99)

## 2014-04-30 NOTE — Discharge Instructions (Signed)
Inpatient Rehab Discharge Instructions  [name] Discharge date and time: No discharge date for patient encounter.   Activities/Precautions/ Functional Status: Activity: activity as tolerated Diet: diabetic diet Wound Care: none needed Functional status:  ___ No restrictions     ___ Walk up steps independently _x__ 24/7 supervision/assistance   ___ Walk up steps with assistance ___ Intermittent supervision/assistance  ___ Bathe/dress independently ___ Walk with walker     ___ Bathe/dress with assistance ___ Walk Independently    ___ Shower independently _x__ Walk with assistance    ___ Shower with assistance ___ No alcohol     ___ Return to work/school ________  Special Instructions:    COMMUNITY REFERRALS UPON DISCHARGE:    Home Health:   PT, OT, [place] EBRAX:[phone] Date of last service:05/01/2014    Medical Equipment/Items Ordered:HAS ALL EQUIPMENT NEEDS FROM PREVIOUS ADMITS   Other:RESUME CAPS-CARING HANDS  GENERAL COMMUNITY RESOURCES FOR PATIENT/FAMILY: Support [place]  My questions have been answered and I understand these instructions. I will adhere to these goals and the provided educational materials after my discharge from the hospital.  Patient/Caregiver Signature _______________________________ Date __________  Clinician Signature _______________________________________ Date __________  Please bring this form and your medication list with you to all your follow-up doctor's appointments.

## 2014-04-30 NOTE — Progress Notes (Signed)
Social Work Patient ID: [name], female   DOB: 11/18/1926, 80 y.o.   MRN: [id number] Have scheduled [facility] for 1;00 pm transport home tomorrow.  Have informed [name] and pt and both feel this is a good time for them. Pt's grandson or nephew will ride home with her tomorrow.  Ready for discharge tomorrow.

## 2014-04-30 NOTE — Progress Notes (Signed)
Social Work Patient ID: [name], female   DOB: [birth year], 80 y.o.   MRN: [id number] Spoke with [facility] pt's CAP agency and she reports her CNA's know how to transfer pts and if questions could get direction from Their RN or home health therapists.  She really can't have both of them come in for education for pt.  Will inform therapy and work toward  discharge tomorrow.

## 2014-04-30 NOTE — Progress Notes (Signed)
Occupational Therapy Discharge Summary  Patient Details  Name: [name] MRN: [id number] Date of Birth: 24-Feb-1927  Today's Date: 04/30/2014 Time: 1000-1100 Time Calculation (min): 60 min  GRAD DAY! 1:1 focus on self care retraining including bed mobility, bathing and dressing EOB/ and supine with rolling, basic transfers bed<>scooter with SB, toilet transfer to regular commode with grab bar with mod A, grooming at sink mod I and education regarding skin care with SB transfers. Pt reports she may return to wearing dresses to decr assistance needed for LB clothing especially with toileting.   Patient has met 7 of 7 long term goals due to improved activity tolerance, improved balance, postural control and functional transfers.  Patient to discharge at overall mod A and max A for LB dressing  level.  Pt can perform basic scoot pivot transfer to a drop arm commode or regular toilet with grab bar with mod A and max A for toileting (pt can complete hygiene), bathing and dressing at bed level with mod A.  Patient's care partner [facility] declined to attend education sessions reporting they know how to assist her. e at discharge.    Reasons goals not met: n/a  Recommendation:  Patient will benefit from ongoing skilled OT services in home health setting to continue to advance functional skills in the area of BADL and Reduce care partner burden.  Equipment: No equipment provided  Reasons for discharge: treatment goals met and discharge from hospital  Patient/family agrees with progress made and goals achieved: Yes  OT Discharge Precautions/Restrictions  Precautions Precautions: Fall;Other (comment) Precaution Comments: R AKA, doesn't stand or walk Restrictions Weight Bearing Restrictions: No    Vital Signs Therapy Vitals Pulse Rate: 56 BP: 136/62 mmHg Patient Position (if appropriate): Sitting Pain  toe is sore - relief and adjusted toe ADL  see FIM Vision/Perception  Vision-  History Baseline Vision/History: Wears glasses Wears Glasses: Reading only  Cognition Overall Cognitive Status: Within Functional Limits for tasks assessed Arousal/Alertness: Awake/alert Orientation Level: Oriented X4 Memory: Impaired Memory Impairment: Decreased recall of new information Awareness: Appears intact Problem Solving: Appears intact Safety/Judgment: Appears intact Sensation Sensation Light Touch: Appears Intact Stereognosis: Not tested Hot/Cold: Not tested Proprioception: Appears Intact Additional Comments: numbness/tingling in RUE, decreased sensation RLE vs LLE, phantom pain in RLE Coordination Gross Motor Movements are Fluid and Coordinated: Yes Fine Motor Movements are Fluid and Coordinated: Yes Coordination and Movement Description: R & L w/ fine motor impairments, right hand w/ arthritis, paresthesias/impaired sensibility and inability to perform full fist. R UE AROM for shoulder flexion approximately 80* at best, ~95-100* LUE. Significant crepitus bilateral UE's, R > L impacting WB and ability to assist w/ ADL's and transfers. Severe osteoarthritic changes in the glenohumeral joint right.  Motor  Motor Motor: Other (comment);Abnormal postural alignment and control Motor - Discharge Observations: R severe OA in glenohumeral joint. R AKA s/p 2012, pt has been non-ambulatory for past 3 yrs, limited/no ability to use RUE to assist with transfers, pt is safer performing SB transfers.  Mobility  Bed Mobility Bed Mobility: Supine to Sit;Sit to Supine;Rolling Right;Rolling Left Rolling Right: 5: Supervision;With rail Rolling Right Details: Verbal cues for technique;Verbal cues for precautions/safety Rolling Left: 5: Supervision Rolling Left Details: Verbal cues for technique;Verbal cues for precautions/safety Supine to Sit: 4: Min assist;HOB elevated;With rails Supine to Sit Details: Verbal cues for sequencing;Verbal cues for technique;Verbal cues for  precautions/safety;Manual facilitation for weight shifting Sit to Supine: 4: Min assist Sit to Supine - Details: Manual facilitation  for weight shifting;Verbal cues for technique;Verbal cues for precautions/safety;Verbal cues for sequencing Transfers Sit to Stand: 2: Max assist Sit to Stand Details: Verbal cues for sequencing;Verbal cues for technique;Verbal cues for precautions/safety;Manual facilitation for weight shifting;Manual facilitation for weight bearing Stand to Sit: 2: Max assist Stand to Sit Details (indicate cue type and reason): Verbal cues for sequencing;Verbal cues for technique;Verbal cues for precautions/safety;Manual facilitation for weight shifting;Manual facilitation for weight bearing  Trunk/Postural Assessment  Cervical Assessment Cervical Assessment: Exceptions to [facility] Cervical Strength Overall Cervical Strength Comments: Pt with cervical flexion Thoracic Assessment Thoracic Assessment: Exceptions to [facility] Thoracic Strength Overall Thoracic Strength Comments: Pt with thoracic kyphosis Lumbar Assessment Lumbar Assessment: Exceptions to [facility] Lumbar Strength Overall Lumbar Strength Comments: Pt sits with posterior pelic tilt Postural Control Postural Control: Within Functional Limits  Balance Balance Balance Assessed: Yes Static Sitting Balance Static Sitting - Balance Support: Feet supported;Bilateral upper extremity supported Dynamic Sitting Balance Dynamic Sitting - Balance Support: Feet supported;No upper extremity supported Dynamic Sitting - Level of Assistance: 6: Modified independent (Device/Increase time) Dynamic Sitting - Balance Activities: Lateral lean/weight shifting;Forward lean/weight shifting Extremity/Trunk Assessment RUE Assessment RUE Assessment: Exceptions to [facility] RUE AROM (degrees) Overall AROM Right Upper Extremity:  (R & L w/ fine motor impairments, right hand w/ arthritis, paresthesias/impaired sensibility and inability to perform full fist.  R UE AROM for shoulder flexion approximately 80* at best, ~95-100* LUE. Significant crepitus bilateral UE's, R > L impacting W) LUE Assessment LUE Assessment: Exceptions to [facility] (~90-100 shoulder flexion)  See FIM for current functional status  Willeen Cass [facility] 04/30/2014, 3:22 PM

## 2014-04-30 NOTE — Discharge Summary (Signed)
NAMEVICY, MEDICO                ACCOUNT NO.:  [id number]  MEDICAL RECORD NO.:  [id number]  LOCATION:  4W07C                        FACILITY:  [facility]  PHYSICIAN:  Charlett Blake, M.D.DATE OF BIRTH:  1927/01/08  DATE OF ADMISSION:  04/21/2014 DATE OF DISCHARGE:  05/01/2014                              DISCHARGE SUMMARY   DISCHARGE DIAGNOSES: 1. Functional deficits secondary to multimedical deconditioning with     history of right BKA. 2. Subcutaneous Lovenox for deep vein thrombosis prophylaxis. 3. Pain management. 4. Depression. 5. Diastolic congestive heart failure. 6. Hypertension. 7. Diabetes mellitus with peripheral neuropathy. 8. Chronic renal insufficiency with baseline creatinine 2.02. 9. Chronic anemia. 10.Hyperlipidemia. 11.History of gout. 12.Gastroesophageal reflux disease.  HISTORY OF PRESENT ILLNESS:  This is an 80 year old right-handed female with history of diabetes mellitus, peripheral neuropathy,  diastolic congestive heart failure, right AKA September 2012, of which she received inpatient rehab services as well as prosthesis.  The patient primarily wheelchair-bound since January, limited use of prosthesis and assistance of her granddaughter and hired caregivers.  Presented April 16, 2014, with complaints of worsening shortness of breath for 2 days as well as nonproductive cough.  Noted hypoxemia of 88% in the emergency room, placed on 3 L oxygen.  Chest x-ray showed pulmonary edema and small bilateral pleural effusions.  She was administered intravenous Lasix.  Noted hyperkalemia 5.7, received Kayexalate.  Complaints of right shoulder pain.  X-rays negative for fracture.  Subcutaneous Lovenox for DVT prophylaxis.  Followup chest x-ray showed improved. Physical and occupational therapy ongoing.  The patient was admitted for comprehensive rehab program due to deconditioning.  PAST MEDICAL HISTORY:  See discharge diagnoses.  SOCIAL HISTORY:  Lives with  granddaughter and hired assistance as needed.  FUNCTIONAL STATUS:  Upon admission to rehab services primarily wheelchair.  She does have a prosthesis.  Functional status upon admission to rehab services was moderate assist for squat pivot transfers, moderate assist for lateral scoot transfers, mod max assist activities of daily living.  PHYSICAL EXAMINATION:  VITAL SIGNS:  Blood pressure 148/50, pulse 59, temperature 98.4, respirations 18. GENERAL:  This was an alert female, in no acute distress.  Pupils round and reactive to light.  She was oriented x3. LUNGS:  Decreased breath sounds, but clear to auscultation. CARDIAC:  Regular rate and rhythm. ABDOMEN:  Soft, nontender.  Good bowel sounds.  Right AKA is well healed and shaped.  REHABILITATION HOSPITAL COURSE:  The patient was admitted to inpatient rehab services with therapies initiated on a 3-hour daily basis consisting of physical therapy, occupational therapy, and rehabilitation nursing.  The following issues were addressed during the patient's rehabilitation stay.  Pertaining to Ms. Simms's multimedical deconditioning related to his acute on chronic diastolic congestive heart failure, remained stable.  Oxygen saturations continued to improve.  She was maintained on Lasix therapy as advised.  Latest chest x-ray 06/16, improved CHF with some bibasilar atelectasis.  The patient did have a history of right AKA, limited use of prosthesis over the last year.  Her AKA site was well shaped as well as healed nicely.  She remained on subcutaneous Lovenox for DVT prophylaxis throughout her rehab course.  No bleeding episodes.  Blood pressures controlled with no orthostatic changes.  She would follow up with her primary MD.  She did have a history of diabetes mellitus, peripheral neuropathy.  Hemoglobin A1c of 6.4.  Diabetic teaching ongoing.  Chronic renal insufficiency, baseline creatinine 2.02 with latest creatinine 2.01.  Noted  history of depression.  She remained on Celexa with emotional support provided. She was attending full therapies.  She continued on iron supplement for chronic anemia.  The patient received weekly collaborative interdisciplinary team conferences to discuss estimated length of stay, family teaching, and any barriers to discharge.  The patient is at the edge of bed with minimal assist for scooting her hips, perform stand pivot transfers to bedside commode with granddaughter present to stabilize her commode chair.  She could again stand in order to switch bedside commode back to her wheelchair.  She was wearing a Darco shoe. Family teaching completed with granddaughter as well as caregivers.  The patient did show good progress during her rehab stay and ongoing therapies at time of discharge would be arranged.  DISCHARGE MEDICATIONS: 1. Allopurinol 100 mg p.o. daily. 2. Norvasc 10 mg p.o. daily. 3. Zostrix cream twice daily applied to affected areas. 4. Coreg 3.125 mg p.o. b.i.d. 5. Celexa 10 mg p.o. daily. 6. Colace 100 mg daily. 7. Zetia 10 mg p.o. daily. 8. Pepcid 20 mg p.o. daily. 9. Lasix 60 mg p.o. b.i.d. 10.Hydralazine 100 mg p.o. t.i.d. 11.Niferex 150 mg p.o. b.i.d. 12.Imdur 120 mg p.o. daily. 13.Claritin 10 mg p.o. daily. 14.Singulair 10 mg p.o. daily. 15.Ocean nasal spray as needed. 16.Timoptic ophthalmic solution 1 drop both eyes b.i.d. 17.Ultram 50 mg p.o. every 12 hours as needed for pain.  DIET:  Diabetic diet.  SPECIAL INSTRUCTIONS:  The patient would follow up with Dr. Alysia Penna at the outpatient rehab service office as needed; Dr. Gaynelle Arabian medical management.  Ongoing therapies dictated as per rehab services.     Lauraine Rinne, P.A.   ______________________________ Charlett Blake, M.D.    DA/MEDQ  D:  04/30/2014  T:  04/30/2014  Job:  [id number]  cc:   Charlett Blake, M.D. Modena Jansky. Marisue Humble, M.D.

## 2014-04-30 NOTE — Progress Notes (Signed)
Social Work Elease Hashimoto, LCSW Social Worker Signed  Patient Care Conference Service date: 04/29/2014 1:35 PM  Inpatient RehabilitationTeam Conference and Plan of Care Update Date: 04/29/2014   Time: 11;50 Am     Patient Name: [name]       Medical Record Number: [id number]   Date of Birth: 10-07-1927 Sex: Female         Room/Bed: 4W07C/4W07C-01 Payor Info: Payor: MEDICARE / Plan: MEDICARE PART A AND B / Product Type: *No Product type* /   Admitting Diagnosis: Deconditioned   Admit Date/Time:  04/21/2014  4:34 PM Admission Comments: No comment available   Primary Diagnosis:  <principal problem not specified> Principal Problem: <principal problem not specified>    Patient Active Problem List     Diagnosis  Date Noted   .  Hyperkalemia  04/21/2014   .  Sick-euthyroid syndrome  04/21/2014   .  CHF (congestive heart failure)  04/16/2014   .  C. difficile colitis  06/26/2012   .  Acute on chronic renal failure  06/24/2012   .  Pneumonia  06/23/2012   .  Acute diastolic CHF (congestive heart failure)  06/22/2012   .  SIRS (systemic inflammatory response syndrome)  06/22/2012   .  DM2 (diabetes mellitus, type 2)  06/22/2012   .  CKD (chronic kidney disease), stage IV  06/22/2012   .  HTN (hypertension)  06/22/2012   .  Breast cancer  06/22/2012   .  PVD (peripheral vascular disease)  06/22/2012   .  COPD (chronic obstructive pulmonary disease)  06/22/2012   .  S/P AKA (above knee amputation)  08/28/2011   .  Atherosclerosis of native arteries of the extremities with ulceration  07/24/2011     Expected Discharge Date: Expected Discharge Date: 05/01/14  Team Members Present: Physician leading conference: Dr. Alysia Penna Social Worker Present: Ovidio Kin, LCSW Nurse Present: Elliot Cousin, RN PT Present: Cameron Sprang, PT OT Present: Antony Salmon, OT SLP Present: Other (comment);Gunnar Fusi, SLP Elmyra Ricks Page-SP) PPS Coordinator present : Ileana Ladd, Lelan Pons, RN, [facility]        Current Status/Progress  Goal  Weekly Team Focus   Medical     deconditioning arm weakness, Left foot pain, Left knee pain  downgrade goals to Mod A  Left knee pain, left foot pain   Bowel/Bladder     Continent of bowel and bladder, calls for assistance LBM 04/28/2014 colace 100mg  and Senokot 1702  Remain continent of bowel and bladder, remain free of skin breakdown and infection.  Assist with regular tolileting. Monitor for constipation.   Swallow/Nutrition/ Hydration     wfl       ADL's     R & L w/ fine motor impairments, right hand w/ arthritis, paresthesias/impaired sensibility and inability to perform full fist. R UE AROM for shoulder flexion approximately 80* at best, ~95-100* LUE. Significant crepitus bilateral UE's, R > L impacting WB and ability to assist w/ ADL's and transfers. Severe osteoarthritic changes in the glenohumeral joint right.   max A for toileting, min A with LB done in the bed, bathing in bed withmod A, transfers with mod A  min A for toileting, min A with LB done in the bed, bathing in bed with min A, transfers with min A    Mobility     Pt is currently mod/max assist for bed mobility (better with use of bed rails and HOB elevated), min to mod with SB  transfers, however if going uphill can require up to max assist.  Have done "lift off's" with UE support wtih max A.   S overall, however will need to downgrade to at least Min A.   functional SB transfers, strengthening, pt/family training   Communication     [facility]       Safety/Cognition/ Behavioral Observations    no unsafe behaviors       Pain     Tylenol 650 mg Q6h, Zostrix cream to shoulders. Pain rated 5 on scale 0-10.  Pain score <3  Assess pain Q shift and prn.   Skin     MASD to right abdominal folds microguard powder applied, bunion Left great toe.  No infection and increases skin breakdown during rehab.  Assess skin Q shift.     *See Care Plan and progress notes for long and  short-term goals.    Barriers to Discharge:  prior R AKA      Possible Resolutions to Barriers:    CAP worker to be trained      Discharge Planning/Teaching Needs:    Home with daughter's and CAP worker, aware of 24 hr care is needed.      Team Discussion:    Off O2, need family education with daughter, granddaughter feels knows her care needs, has been providing it prior to admission.  Family bringing scooter in to try with transfers, prior to discharge.   Revisions to Treatment Plan:    Downgraded goals to mod/min level.  Need family education    Continued Need for Acute Rehabilitation Level of Care: The patient requires daily medical management by a physician with specialized training in physical medicine and rehabilitation for the following conditions: Daily direction of a multidisciplinary physical rehabilitation program to ensure safe treatment while eliciting the highest outcome that is of practical value to the patient.: Yes Daily medical management of patient stability for increased activity during participation in an intensive rehabilitation regime.: Yes Daily analysis of laboratory values and/or radiology reports with any subsequent need for medication adjustment of medical intervention for : Neurological problems;Other;Pulmonary problems;Cardiac problems  Elease Hashimoto 04/30/2014, 3:50 PM         Elease Hashimoto, LCSW Social Worker Signed  Patient Care Conference Service date: 04/22/2014 2:05 PM  Inpatient RehabilitationTeam Conference and Plan of Care Update Date: 04/22/2014   Time: 11:40 AM     Patient Name: [name]       Medical Record Number: [id number]   Date of Birth: 25-Jun-1927 Sex: Female         Room/Bed: 4W07C/4W07C-01 Payor Info: Payor: MEDICARE / Plan: MEDICARE PART A AND B / Product Type: *No Product type* /   Admitting Diagnosis: Deconditioned   Admit Date/Time:  04/21/2014  4:34 PM Admission Comments: No comment available   Primary Diagnosis:   <principal problem not specified> Principal Problem: <principal problem not specified>    Patient Active Problem List     Diagnosis  Date Noted   .  Hyperkalemia  04/21/2014   .  Sick-euthyroid syndrome  04/21/2014   .  CHF (congestive heart failure)  04/16/2014   .  C. difficile colitis  06/26/2012   .  Acute on chronic renal failure  06/24/2012   .  Pneumonia  06/23/2012   .  Acute diastolic CHF (congestive heart failure)  06/22/2012   .  SIRS (systemic inflammatory response syndrome)  06/22/2012   .  DM2 (diabetes mellitus, type 2)  06/22/2012   .  CKD (chronic kidney disease), stage IV  06/22/2012   .  HTN (hypertension)  06/22/2012   .  Breast cancer  06/22/2012   .  PVD (peripheral vascular disease)  06/22/2012   .  COPD (chronic obstructive pulmonary disease)  06/22/2012   .  S/P AKA (above knee amputation)  08/28/2011   .  Atherosclerosis of native arteries of the extremities with ulceration  07/24/2011     Expected Discharge Date: Expected Discharge Date: 05/01/14  Team Members Present: Physician leading conference: Dr. Alysia Penna Social Worker Present: Ovidio Kin, LCSW Nurse Present: Elliot Cousin, RN PT Present: Raylene Everts, PT OT Present: Clyda Greener, OT SLP Present: Gunnar Fusi, SLP PPS Coordinator present : Ileana Ladd, Lelan Pons, RN, CRRN        Current Status/Progress  Goal  Weekly Team Focus   Medical     Deconditioning acute on chronic CHF  maintain medical stability during rehab stay monitor for fluid overload  Monitor tolerance of more intensive therapy program   Bowel/Bladder     continent of bowel and bladder, calls for assistance, LBM 6/8 colace 100 mg BID and Senokot 17.2mg  HS  remain continent with no accidents of incontinent episodes   continue to encourage continent toileting   Swallow/Nutrition/ Hydration     na       ADL's     eval pending       Mobility     eval pending       Communication     na        Safety/Cognition/ Behavioral Observations    no unsafe behaviors       Pain     mild generalized pain relieved with tylenol 650mg   pain less than 3, remain controlled with mile analgesic   assess pain q shift and prn    Skin     MASD to right abdominal folds microguard powder applied, bunion L great toe painful to touch   skin issues will be resolved and no new breakdown during rehab stay   assess skin q shift, keep folds dry and free from breakdown      *See Care Plan and progress notes for long and short-term goals.    Barriers to Discharge:  Prior Hx AKA      Possible Resolutions to Barriers:    Cont rehab, family training prior to D/C      Discharge Planning/Teaching Needs:    Home with granddaughter and CAP worker.  Someone is always with her.  Pt plans to eventually get back to her home    Team Discussion:    New eval-doing well, checking O2 sats during therapy and at rest.  Arthritis limits her movement.  Wants to get back to baseline before going home.   Revisions to Treatment Plan:    New Eval    Continued Need for Acute Rehabilitation Level of Care: The patient requires daily medical management by a physician with specialized training in physical medicine and rehabilitation for the following conditions: Daily direction of a multidisciplinary physical rehabilitation program to ensure safe treatment while eliciting the highest outcome that is of practical value to the patient.: Yes Daily medical management of patient stability for increased activity during participation in an intensive rehabilitation regime.: Yes Daily analysis of laboratory values and/or radiology reports with any subsequent need for medication adjustment of medical intervention for : Neurological problems;Other  Dupree, Gardiner Rhyme 04/22/2014, 2:05 PM  Patient ID: [name], female   DOB: 1927-05-29, 80 y.o.   MRN: [id number]

## 2014-04-30 NOTE — Progress Notes (Signed)
80 y.o. right-handed female with history of diabetes mellitus with peripheral neuropathy, diastolic congestive heart failure, CAD, hypertension and chronic renal insufficiency with creatinine baseline 2.02 and history of right AKA September 2012 and received inpatient rehabilitation services and prosthesis provided by [employer] [facility]. Patient primarily wheelchair bound since January and limited use of prosthesis and assistance of her granddaughter and hired caregivers. Presented 04/16/2014 with complaints of worsening shortness of breath for 2 days as well as nonproductive cough. Noted hypoxemia of 88% in the ED was placed on 3 L oxygen. Chest x-ray showed pulmonary edema small bilateral pleural effusions. She was administered intravenous Lasix. Noted hyperkalemia 5.7 received Kayexalate. Complaints of right shoulder pain with x-rays negative for fracture but did show severe osteoarthritic changes in the glenohumeral joint  Subjective/Complaints: No SOB or CP, no probelems noted in PT.  Still with occ R>L Shoulder pain Standing less painful in Darco sandal Review of Systems - Negative except joint pain left knee and Left foot  Objective: Vital Signs: Blood pressure 143/76, pulse 57, temperature 98.7 F (37.1 C), temperature source Oral, resp. rate 18, height 5' 2"  (1.575 m), weight 80.65 kg (177 lb 12.8 oz), SpO2 96.00%. Dg Chest 2 View  04/28/2014   CLINICAL DATA:  Increased productive cough, hypertension, diabetes, CHF, COPD, coronary artery disease  EXAM: CHEST  2 VIEW  COMPARISON:  04/18/2014  FINDINGS: Enlargement of cardiac silhouette post CABG.  Calcified tortuous thoracic aorta.  Slight rotation to the RIGHT.  Pulmonary vascular congestion.  Improved pulmonary edema since previous exam.  Bibasilar atelectasis.  No pleural effusion or pneumothorax.  Advanced BILATERAL glenohumeral degenerative changes and LEFT chronic rotator cuff tear.  IMPRESSION: Improved CHF with persistent mild edema and  bibasilar atelectasis.   Electronically Signed   By: Lavonia Dana M.D.   On: 04/28/2014 15:18   Results for orders placed during the hospital encounter of 04/21/14 (from the past 72 hour(s))  GLUCOSE, CAPILLARY     Status: Abnormal   Collection Time    04/27/14 12:00 PM      Result Value Ref Range   Glucose-Capillary 128 (*) 70 - 99 mg/dL  GLUCOSE, CAPILLARY     Status: Abnormal   Collection Time    04/27/14  5:03 PM      Result Value Ref Range   Glucose-Capillary 117 (*) 70 - 99 mg/dL  GLUCOSE, CAPILLARY     Status: Abnormal   Collection Time    04/27/14 10:14 PM      Result Value Ref Range   Glucose-Capillary 156 (*) 70 - 99 mg/dL   Comment 1 Notify RN    CREATININE, SERUM     Status: Abnormal   Collection Time    04/28/14  6:05 AM      Result Value Ref Range   Creatinine, Ser 1.98 (*) 0.50 - 1.10 mg/dL   GFR calc non Af Amer 22 (*) >90 mL/min   GFR calc Af Amer 25 (*) >90 mL/min   Comment: (NOTE)     The eGFR has been calculated using the CKD EPI equation.     This calculation has not been validated in all clinical situations.     eGFR's persistently <90 mL/min signify possible Chronic Kidney     Disease.  GLUCOSE, CAPILLARY     Status: None   Collection Time    04/28/14  7:48 AM      Result Value Ref Range   Glucose-Capillary 96  70 - 99 mg/dL  GLUCOSE, CAPILLARY  Status: Abnormal   Collection Time    04/28/14 11:44 AM      Result Value Ref Range   Glucose-Capillary 129 (*) 70 - 99 mg/dL   Comment 1 Notify RN    GLUCOSE, CAPILLARY     Status: Abnormal   Collection Time    04/28/14  5:11 PM      Result Value Ref Range   Glucose-Capillary 137 (*) 70 - 99 mg/dL  GLUCOSE, CAPILLARY     Status: Abnormal   Collection Time    04/28/14  8:41 PM      Result Value Ref Range   Glucose-Capillary 172 (*) 70 - 99 mg/dL  GLUCOSE, CAPILLARY     Status: None   Collection Time    04/29/14  7:39 AM      Result Value Ref Range   Glucose-Capillary 92  70 - 99 mg/dL  GLUCOSE,  CAPILLARY     Status: Abnormal   Collection Time    04/29/14 11:38 AM      Result Value Ref Range   Glucose-Capillary 129 (*) 70 - 99 mg/dL  GLUCOSE, CAPILLARY     Status: Abnormal   Collection Time    04/29/14  4:33 PM      Result Value Ref Range   Glucose-Capillary 130 (*) 70 - 99 mg/dL  GLUCOSE, CAPILLARY     Status: Abnormal   Collection Time    04/29/14 10:15 PM      Result Value Ref Range   Glucose-Capillary 150 (*) 70 - 99 mg/dL  GLUCOSE, CAPILLARY     Status: Abnormal   Collection Time    04/30/14  7:12 AM      Result Value Ref Range   Glucose-Capillary 104 (*) 70 - 99 mg/dL     HEENT: normal Cardio: RRR and no murmur Resp: CTA B/L and unlabored. No wheezes GI: BS positive and non distended Extremity:  Pulses positive and Edema R AKA distal, Left hallux valgus Skin:   Intact Neuro: Alert/Oriented and Abnormal Motor 3-/5 Bilateral deltoid, 5/5 Biceps and trips and grip, 4/5 HF, Left KE and Ankle Musc/Skel:  Other crepitus bilateral shoulder Gen NAD, Morbidly obese   Assessment/Plan: 1. Functional deficits secondary to Deconditioning after exacerbation of CHF which require 3+ hours per day of interdisciplinary therapy in a comprehensive inpatient rehab setting. Physiatrist is providing close team supervision and 24 hour management of active medical problems listed below. Physiatrist and rehab team continue to assess barriers to discharge/monitor patient progress toward functional and medical goals. FIM: FIM - Bathing Bathing Steps Patient Completed: Chest;Abdomen;Right upper leg;Left upper leg Bathing: 2: Max-Patient completes 3-4 42f10 parts or 25-49% (pt states HHA assists with her bathing)  FIM - Upper Body Dressing/Undressing Upper body dressing/undressing steps patient completed: Thread/unthread right sleeve of pullover shirt/dresss;Thread/unthread left sleeve of pullover shirt/dress;Put head through opening of pull over shirt/dress Upper body  dressing/undressing: 2: Max-Patient completed 25-49% of tasks (pt states HHA assists wtih all dressing) FIM - Lower Body Dressing/Undressing Lower body dressing/undressing steps patient completed: Thread/unthread left pants leg;Thread/unthread right pants leg Lower body dressing/undressing: 1: Two helpers (pt needs max a for standing and another to hike pants.)  FIM - Toileting Toileting steps completed by patient: Performs perineal hygiene Toileting Assistive Devices: Grab bar or rail for support Toileting: 0: Activity did not occur  FIM - TRadio producerDevices: Bedside commode Toilet Transfers: 2-To toilet/BSC: Max A (lift and lower assist);2-From toilet/BSC: Max A (lift and lower  assist)  FIM - Control and instrumentation engineer Devices: Sliding board;Arm rests Bed/Chair Transfer: 4: Supine > Sit: Min A (steadying Pt. > 75%/lift 1 leg)  FIM - Locomotion: Wheelchair Distance: 25 Locomotion: Wheelchair: 0: Activity did not occur FIM - Locomotion: Ambulation Locomotion: Ambulation: 0: Activity did not occur  Comprehension Comprehension Mode: Auditory Comprehension: 5-Follows basic conversation/direction: With no assist  Expression Expression Mode: Verbal Expression: 7-Expresses complex ideas: With no assist  Social Interaction Social Interaction: 7-Interacts appropriately with others - No medications needed.  Problem Solving Problem Solving: 5-Solves complex 90% of the time/cues < 10% of the time  Memory Memory: 5-Recognizes or recalls 90% of the time/requires cueing < 10% of the time  Medical Problem List and Plan:  1. Functional deficits secondary to Multi-medical/Deconditioning/history right AKA September 2012  2. DVT Prophylaxis/Anticoagulation: Subcutaneous Lovenox. Monitor platelet counts any signs of bleeding  3. Pain Management: Tylenol as needed , Left foot pain hallux valgus diabetic sandal needed, knee OA voltaren  gel 4. Mood/depression: Celexa 10 mg daily. Provide emotional support  5. Neuropsych: This patient is capable of making decisions on her own behalf.  6. Diastolic congestive heart failure. Lasix 60 mg twice a day. Weight ok. I's and O's +  -follow up cxr today given increased cough. 7. Hypertension. Norvasc 10 mg daily, Coreg 3.125 mg twice a day, hydralazine 100 mg 3 times a day, Imdur 120 mg daily. Monitor with increased mobility  8. Diabetes mellitus with peripheral neuropathy. Hemoglobin A1c 6.4. Continue sliding scale insulin. Sugars under better control at present 9. Chronic renal insufficiency. Baseline creatinine 2.02. Followup chemistries  10. Chronic anemia. Continue Niferex.Microcytic indices Followup PCP 11. Hyperlipidemia. Zetia  12. History of gout. Zyloprim. Monitor for any gout flare ups  13. GERD. Pepcid  14. COPD. Compensated has some rhinitis Singulair 10 mg daily     LOS (Days) 9 A FACE TO FACE EVALUATION WAS PERFORMED  [name] E 04/30/2014, 8:31 AM

## 2014-04-30 NOTE — Progress Notes (Signed)
Physical Therapy Discharge Summary  Patient Details  Name: [name] MRN: [id number] Date of Birth: [birth year]  Today's Date: 04/30/2014 Time: 9211-9417 and 4081-4481 Time Calculation (min): 47 min and 44 mins  Patient has met 5 of 5 long term goals due to improved activity tolerance, improved balance, improved postural control, increased strength, decreased pain and ability to compensate for deficits.  Patient to discharge at a wheelchair level Supervision (pts personal electric scooter).   Patient's care partner is independent to provide the necessary physical assistance at discharge.  Reasons goals not met: n/a  Recommendation:  Patient will benefit from ongoing skilled PT services in home health setting to continue to advance safe functional mobility, address ongoing impairments in overall strength, decreased functional use of UEs, decreased endurance, and minimize fall risk.  Equipment: No equipment provided  Reasons for discharge: treatment goals met and discharge from hospital  Patient/family agrees with progress made and goals achieved: Yes  PT Treatment/Intervention:  AM session:  Pt received lying in bed, finishing breakfast and requesting more time to do so, therefore missed 13 mins of skilled session.  Performed bed mobility with HOB elevated but without handrail as granddaughter stated that her bedrail is full length of bed and she will not be able to use.  Performed at min A level with cues for scooting technique.  Performed slideboard transfer bed>scooter at min/mod A with continued cues for forward weight shift and head/hip relationship during transfer.  Pt able to negotiate scooter >200' at Mod I level and did very well positioning scooter when transferring.  Performed car transfer at min/mod A level with cues for hand placement and forward weight shift.  Performed another slideboard transfer scooter <> mat (at lower level to simulate furniture) at mod A level with cues  mentioned above.  Pt utilized scooter to get back to room.  Performed squat pivot transfer to toilet.  RN notified that pt in restroom.  Pt knows to pull call bell when ready to transfer back to scooter.   PM session:  Pt received sitting in scooter in room, agreeable to therapy.  Skilled session focused on transfer training with SB for continued carryover upon D/C and supine therex with provided handout for HEP.  Transferred scooter<>mat with SB at min/mod A.  Performed supine SLR BLE x 10 reps, glute sets x 10 reps, SL hip abd x 10 reps BLE, hip ext x 10 reps BLE.  Transferred back to scooter.  Remainder of session focused on scooter/wc mobility in tight spaces in ADL apt to simulate home.  Performed at Mod I level.  Pt propelled back to room and left in scooter with all needs in reach.   PT Discharge Precautions/Restrictions Restrictions Weight Bearing Restrictions: No    Cognition Orientation Level: Oriented X4 Sensation Sensation Light Touch: Appears Intact Stereognosis: Not tested Hot/Cold: Not tested Proprioception: Appears Intact Coordination Gross Motor Movements are Fluid and Coordinated: Yes (in UEs and LLE) Fine Motor Movements are Fluid and Coordinated: Yes Motor  Motor Motor: Other (comment);Abnormal postural alignment and control Motor - Discharge Observations: R severe OA in glenohumeral joint. R AKA s/p 2012, pt has been non-ambulatory for past 3 yrs, limited/no ability to use RUE to assist with transfers, pt is safer performing SB transfers.   Mobility Bed Mobility Bed Mobility: Supine to Sit;Sit to Supine;Rolling Right;Rolling Left Rolling Right: 5: Supervision;With rail Rolling Right Details: Verbal cues for technique;Verbal cues for precautions/safety Rolling Left: 5: Supervision Rolling Left Details: Verbal cues  for technique;Verbal cues for precautions/safety Supine to Sit: 4: Min assist;HOB elevated;With rails Supine to Sit Details: Verbal cues for  sequencing;Verbal cues for technique;Verbal cues for precautions/safety;Manual facilitation for weight shifting Sit to Supine: 4: Min assist Sit to Supine - Details: Manual facilitation for weight shifting;Verbal cues for technique;Verbal cues for precautions/safety;Verbal cues for sequencing Transfers Transfers: Yes Sit to Stand: 2: Max assist Sit to Stand Details: Verbal cues for sequencing;Verbal cues for technique;Verbal cues for precautions/safety;Manual facilitation for weight shifting;Manual facilitation for weight bearing Stand to Sit: 2: Max assist Stand to Sit Details (indicate cue type and reason): Verbal cues for sequencing;Verbal cues for technique;Verbal cues for precautions/safety;Manual facilitation for weight shifting;Manual facilitation for weight bearing Lateral/Scoot Transfers: With slide board;4: Min assist;3: Mod assist Lateral/Scoot Transfer Details: Verbal cues for sequencing;Verbal cues for technique;Verbal cues for precautions/safety;Manual facilitation for weight shifting;Manual facilitation for weight bearing Locomotion  Ambulation Ambulation: No (pt non-ambulatory at baseline) Gait Gait: No Stairs / Additional Locomotion Stairs: No Wheelchair Mobility Wheelchair Mobility: Yes Wheelchair Assistance: 4: Advertising account executive Details: Tactile cues for initiation;Verbal cues for sequencing;Verbal cues for technique;Verbal cues for precautions/safety Wheelchair Propulsion: Left lower extremity;Left upper extremity;Right upper extremity Wheelchair Parts Management: Supervision/cueing Distance: 200 (electric scooter)  Trunk/Postural Assessment  Cervical Assessment Cervical Assessment: Exceptions to [facility] Cervical Strength Overall Cervical Strength Comments: Pt with cervical flexion Thoracic Assessment Thoracic Assessment: Exceptions to [facility] Thoracic Strength Overall Thoracic Strength Comments: Pt with thoracic kyphosis Lumbar Assessment Lumbar  Assessment: Exceptions to [facility] Lumbar Strength Overall Lumbar Strength Comments: Pt sits with posterior pelic tilt Postural Control Postural Control: Within Functional Limits (in sitting)  Balance Balance Balance Assessed: Yes Static Sitting Balance Static Sitting - Balance Support: Feet supported;Bilateral upper extremity supported (LLE supported) Dynamic Sitting Balance Dynamic Sitting - Balance Support: Feet supported;No upper extremity supported (LLE supported) Dynamic Sitting - Level of Assistance: 6: Modified independent (Device/Increase time) Dynamic Sitting - Balance Activities: Lateral lean/weight shifting;Forward lean/weight shifting Extremity Assessment      RLE Assessment RLE Assessment: Exceptions to [facility] RLE Strength RLE Overall Strength: Deficits RLE Overall Strength Comments: unable to perform hip ext to neutral, hip flex/add limited by body habitus.  Strength grossly 2+ to 3+/5 LLE Assessment LLE Assessment: Exceptions to [facility] LLE Strength LLE Overall Strength: Deficits LLE Overall Strength Comments: overall 4/5 throughout except PF (weak, not formally tested)  See FIM for current functional status  Denice Bors 04/30/2014, 12:16 PM

## 2014-04-30 NOTE — Discharge Summary (Signed)
Discharge summary job # [id number]

## 2014-04-30 NOTE — Progress Notes (Signed)
Occupational Therapy Session Note  Patient Details  Name: [name] MRN: [id number] Date of Birth: [birth year]  Today's Date: 04/30/2014 Time: 3435-6861 Time Calculation (min): 26 min  Skilled Therapeutic Interventions/Progress Updates:    Pt performed sliding board transfers from electric wheelchair to EOM.  Pt needing mod to max assist for transfer to slightly uphill mat.  Pt with decreased integration of the LLE to assist with functional transfer at times requiring mod assist and mod instructional cueing.  Pt also needs assistance with stabilizing and inserting the board as well.  She was able to transfer back to the wheelchair with mod assist as well.    Therapy Documentation Precautions:  Precautions Precautions: Fall;Other (comment) Precaution Comments: R AKA, doesn't stand or walk Restrictions Weight Bearing Restrictions: No  Pain: Pain Assessment Pain Assessment: No/denies pain ADL: See FIM for current functional status  Therapy/Group: Individual Therapy  MCGUIRE,JAMES OTR/L 04/30/2014, 4:33 PM

## 2014-04-30 NOTE — Progress Notes (Signed)
Social Work Patient ID: [name], female   DOB: 1927/02/23, 80 y.o.   MRN: [id number] Have contacted MD office-pt's PCP to try to get pt's follow up appointment, awaiting return call from them.

## 2014-05-01 ENCOUNTER — Encounter: Payer: Self-pay | Admitting: Family

## 2014-05-01 DIAGNOSIS — R5381 Other malaise: Secondary | ICD-10-CM

## 2014-05-01 DIAGNOSIS — I5031 Acute diastolic (congestive) heart failure: Secondary | ICD-10-CM

## 2014-05-01 DIAGNOSIS — E119 Type 2 diabetes mellitus without complications: Secondary | ICD-10-CM

## 2014-05-01 DIAGNOSIS — N184 Chronic kidney disease, stage 4 (severe): Secondary | ICD-10-CM

## 2014-05-01 DIAGNOSIS — I10 Essential (primary) hypertension: Secondary | ICD-10-CM

## 2014-05-01 LAB — GLUCOSE, CAPILLARY
Glucose-Capillary: 100 mg/dL — ABNORMAL HIGH (ref 70–99)
Glucose-Capillary: 151 mg/dL — ABNORMAL HIGH (ref 70–99)

## 2014-05-01 MED ORDER — TRAMADOL HCL 50 MG PO TABS: 50.0000 mg | ORAL_TABLET | Freq: Two times a day (BID) | ORAL | Status: DC | PRN

## 2014-05-01 MED ORDER — FUROSEMIDE 40 MG PO TABS: 60.0000 mg | ORAL_TABLET | Freq: Two times a day (BID) | ORAL | Status: DC

## 2014-05-01 MED ORDER — ISOSORBIDE MONONITRATE ER 120 MG PO TB24: 120.0000 mg | ORAL_TABLET | Freq: Every day | ORAL | Status: AC

## 2014-05-01 MED ORDER — ALLOPURINOL 100 MG PO TABS: 100.0000 mg | ORAL_TABLET | Freq: Every day | ORAL | Status: DC

## 2014-05-01 MED ORDER — LORATADINE 10 MG PO TABS: 10.0000 mg | ORAL_TABLET | Freq: Every day | ORAL | Status: AC

## 2014-05-01 MED ORDER — FAMOTIDINE 20 MG PO TABS: 20.0000 mg | ORAL_TABLET | Freq: Every day | ORAL | Status: DC

## 2014-05-01 MED ORDER — CARVEDILOL 3.125 MG PO TABS: 3.1250 mg | ORAL_TABLET | Freq: Two times a day (BID) | ORAL | Status: DC

## 2014-05-01 MED ORDER — EZETIMIBE 10 MG PO TABS: 10.0000 mg | ORAL_TABLET | Freq: Every day | ORAL | Status: DC

## 2014-05-01 MED ORDER — CAPSAICIN 0.025 % EX CREA: TOPICAL_CREAM | Freq: Two times a day (BID) | CUTANEOUS | Status: DC

## 2014-05-01 MED ORDER — POLYSACCHARIDE IRON COMPLEX 150 MG PO CAPS: 150.0000 mg | ORAL_CAPSULE | Freq: Two times a day (BID) | ORAL | Status: DC

## 2014-05-01 MED ORDER — HYDRALAZINE HCL 100 MG PO TABS: 100.0000 mg | ORAL_TABLET | Freq: Three times a day (TID) | ORAL | Status: DC

## 2014-05-01 MED ORDER — AMLODIPINE BESYLATE 10 MG PO TABS: 10.0000 mg | ORAL_TABLET | Freq: Every day | ORAL | Status: AC

## 2014-05-01 MED ORDER — MONTELUKAST SODIUM 10 MG PO TABS: 10.0000 mg | ORAL_TABLET | Freq: Every day | ORAL | Status: AC

## 2014-05-01 MED ORDER — CITALOPRAM HYDROBROMIDE 10 MG PO TABS: 10.0000 mg | ORAL_TABLET | Freq: Every day | ORAL | Status: DC

## 2014-05-01 NOTE — Progress Notes (Signed)
80 y.o. right-handed female with history of diabetes mellitus with peripheral neuropathy, diastolic congestive heart failure, CAD, hypertension and chronic renal insufficiency with creatinine baseline 2.02 and history of right AKA September 2012 and received inpatient rehabilitation services and prosthesis provided by [employer] [facility]. Patient primarily wheelchair bound since January and limited use of prosthesis and assistance of her granddaughter and hired caregivers. Presented 04/16/2014 with complaints of worsening shortness of breath for 2 days as well as nonproductive cough. Noted hypoxemia of 88% in the ED was placed on 3 L oxygen. Chest x-ray showed pulmonary edema small bilateral pleural effusions. She was administered intravenous Lasix. Noted hyperkalemia 5.7 received Kayexalate. Complaints of right shoulder pain with x-rays negative for fracture but did show severe osteoarthritic changes in the glenohumeral joint  Subjective/Complaints: No SOB or CP, no problems noted in PT Excited about D/C Review of Systems - Negative except joint pain left knee and Left foot  Objective: Vital Signs: Blood pressure 144/55, pulse 62, temperature 98.7 F (37.1 C), temperature source Oral, resp. rate 18, height 5\' 2"  (1.575 m), weight 84.006 kg (185 lb 3.2 oz), SpO2 91.00%. No results found. Results for orders placed during the hospital encounter of 04/21/14 (from the past 72 hour(s))  GLUCOSE, CAPILLARY     Status: Abnormal   Collection Time    04/28/14 11:44 AM      Result Value Ref Range   Glucose-Capillary 129 (*) 70 - 99 mg/dL   Comment 1 Notify RN    GLUCOSE, CAPILLARY     Status: Abnormal   Collection Time    04/28/14  5:11 PM      Result Value Ref Range   Glucose-Capillary 137 (*) 70 - 99 mg/dL  GLUCOSE, CAPILLARY     Status: Abnormal   Collection Time    04/28/14  8:41 PM      Result Value Ref Range   Glucose-Capillary 172 (*) 70 - 99 mg/dL  GLUCOSE, CAPILLARY     Status: None    Collection Time    04/29/14  7:39 AM      Result Value Ref Range   Glucose-Capillary 92  70 - 99 mg/dL  GLUCOSE, CAPILLARY     Status: Abnormal   Collection Time    04/29/14 11:38 AM      Result Value Ref Range   Glucose-Capillary 129 (*) 70 - 99 mg/dL  GLUCOSE, CAPILLARY     Status: Abnormal   Collection Time    04/29/14  4:33 PM      Result Value Ref Range   Glucose-Capillary 130 (*) 70 - 99 mg/dL  GLUCOSE, CAPILLARY     Status: Abnormal   Collection Time    04/29/14 10:15 PM      Result Value Ref Range   Glucose-Capillary 150 (*) 70 - 99 mg/dL  GLUCOSE, CAPILLARY     Status: Abnormal   Collection Time    04/30/14  7:12 AM      Result Value Ref Range   Glucose-Capillary 104 (*) 70 - 99 mg/dL  GLUCOSE, CAPILLARY     Status: Abnormal   Collection Time    04/30/14 11:14 AM      Result Value Ref Range   Glucose-Capillary 165 (*) 70 - 99 mg/dL  GLUCOSE, CAPILLARY     Status: Abnormal   Collection Time    04/30/14  4:20 PM      Result Value Ref Range   Glucose-Capillary 144 (*) 70 - 99 mg/dL  GLUCOSE, CAPILLARY  Status: Abnormal   Collection Time    04/30/14  9:06 PM      Result Value Ref Range   Glucose-Capillary 114 (*) 70 - 99 mg/dL  GLUCOSE, CAPILLARY     Status: Abnormal   Collection Time    05/01/14  7:17 AM      Result Value Ref Range   Glucose-Capillary 100 (*) 70 - 99 mg/dL   Comment 1 Notify RN       HEENT: normal Cardio: RRR and no murmur Resp: CTA B/L and unlabored. No wheezes GI: BS positive and non distended Extremity:  Pulses positive and Edema R AKA distal, Left hallux valgus Skin:   Intact Neuro: Alert/Oriented and Abnormal Motor 3-/5 Bilateral deltoid, 5/5 Biceps and trips and grip, 4/5 HF, Left KE and Ankle Musc/Skel:  Other crepitus bilateral shoulder Gen NAD, Morbidly obese   Assessment/Plan: 1. Functional deficits secondary to Deconditioning after exacerbation of CHF which require 3+ hours per day of interdisciplinary therapy in a  comprehensive inpatient rehab setting. Stable for D/C today F/u PCP in 1-2 weeks F/u Cards 3 weeks See D/C summary See D/C instructions FIM: FIM - Bathing Bathing Steps Patient Completed: Chest;Abdomen;Right upper leg;Left upper leg;Front perineal area;Buttocks (supine in bed) Bathing: 2: Max-Patient completes 3-4 59f 10 parts or 25-49%  FIM - Upper Body Dressing/Undressing Upper body dressing/undressing steps patient completed: Thread/unthread right sleeve of pullover shirt/dresss;Thread/unthread left sleeve of pullover shirt/dress;Put head through opening of pull over shirt/dress Upper body dressing/undressing: 4: Min-Patient completed 75 plus % of tasks FIM - Lower Body Dressing/Undressing Lower body dressing/undressing steps patient completed: Thread/unthread right pants leg;Don/Doff right sock Lower body dressing/undressing: 2: Max-Patient completed 25-49% of tasks  FIM - Toileting Toileting steps completed by patient: Performs perineal hygiene Toileting Assistive Devices: Grab bar or rail for support Toileting: 2: Max-Patient completed 1 of 3 steps  FIM - Radio producer Devices: Recruitment consultant Transfers: 3-To toilet/BSC: Mod A (lift or lower assist);3-From toilet/BSC: Mod A (lift or lower assist)  FIM - Bed/Chair Transfer Bed/Chair Transfer Assistive Devices: Sliding board;HOB elevated (did w/o arm rest due to her hospital bed may not have them) Bed/Chair Transfer: 4: Supine > Sit: Min A (steadying Pt. > 75%/lift 1 leg);3: Bed > Chair or W/C: Mod A (lift or lower assist)  FIM - Locomotion: Wheelchair Distance: 200 (electric scooter) Locomotion: Wheelchair: 6: Travels 150 ft or more, turns around, maneuvers to table, bed or toilet, negotiates 3% grade: maneuvers on rugs and over door sills independently FIM - Locomotion: Ambulation Locomotion: Ambulation: 0: Activity did not occur  Comprehension Comprehension Mode:  Auditory Comprehension: 5-Follows basic conversation/direction: With no assist  Expression Expression Mode: Verbal Expression: 6-Expresses complex ideas: With extra time/assistive device  Social Interaction Social Interaction: 6-Interacts appropriately with others with medication or extra time (anti-anxiety, antidepressant).  Problem Solving Problem Solving: 5-Solves complex 90% of the time/cues < 10% of the time  Memory Memory: 5-Recognizes or recalls 90% of the time/requires cueing < 10% of the time  Medical Problem List and Plan:  1. Functional deficits secondary to Multi-medical/Deconditioning/history right AKA September 2012  2. DVT Prophylaxis/Anticoagulation: Subcutaneous Lovenox. Monitor platelet counts any signs of bleeding  3. Pain Management: Tylenol as needed , Left foot pain hallux valgus diabetic sandal needed, knee OA voltaren gel 4. Mood/depression: Celexa 10 mg daily. Provide emotional support  5. Neuropsych: This patient is capable of making decisions on her own behalf.  6. Diastolic congestive heart failure. Lasix 60 mg  twice a day. Weight ok. I's and O's +, stable  7. Hypertension. Norvasc 10 mg daily, Coreg 3.125 mg twice a day, hydralazine 100 mg 3 times a day, Imdur 120 mg daily. Monitor with increased mobility  8. Diabetes mellitus with peripheral neuropathy. Hemoglobin A1c 6.4. Continue sliding scale insulin. Sugars under better control at present 9. Chronic renal insufficiency. Baseline creatinine 2.02. Followup chemistries  10. Chronic anemia. Continue Niferex.Microcytic indices Followup PCP 11. Hyperlipidemia. Zetia  12. History of gout. Zyloprim. Monitor for any gout flare ups  13. GERD. Pepcid  14. COPD. Compensated has some rhinitis Singulair 10 mg daily     LOS (Days) 10 A FACE TO FACE EVALUATION WAS PERFORMED  KIRSTEINS,ANDREW E 05/01/2014, 8:15 AM

## 2014-05-01 NOTE — Progress Notes (Signed)
Pt. And family got d/C instructions and follow up appointments.Pt. Ready to go home with family.

## 2014-05-01 NOTE — Progress Notes (Signed)
Social Work Discharge Note Discharge Note  The overall goal for the admission was met for:   Discharge location: [id number]  Length of Stay: Yes-10 DAYS  Discharge activity level: Yes-MIN/MOD LEVEL  Home/community participation: Yes  Services provided included: MD, RD, PT, OT, RN, CM, TR, Pharmacy, Neuropsych and SW  Financial Services: Medicare and Medicaid  Follow-up services arranged: Home Health: [facility]-PT,OT,RN and Patient/Family request agency HH: ACTIVE PT WITH [facility], DME: NO NEEDS  Comments (or additional information):RESUME PT'S CAP WORKER AND FAMILY TO PROVIDE THE REST OF THE HOURS OF CARE. HOME VIA SCAT AT 1;00 FAMILY MEMBER TO RIDE WITH HER.  HAS ALL DME FROM PREVIOUS ADMITS    Patient/Family verbalized understanding of follow-up arrangements: Yes  Individual responsible for coordination of the follow-up plan: [name]-GRANDDAUGHTER & CYNTHIA-DAUGHTER  Confirmed correct DME delivered: Elease Hashimoto 05/01/2014    Elease Hashimoto

## 2014-05-04 ENCOUNTER — Encounter (HOSPITAL_COMMUNITY): Payer: Medicare Other

## 2014-05-04 ENCOUNTER — Other Ambulatory Visit (HOSPITAL_COMMUNITY): Payer: Medicare Other

## 2014-05-04 ENCOUNTER — Ambulatory Visit: Payer: Medicare Other | Admitting: Family

## 2014-05-14 ENCOUNTER — Encounter: Payer: Self-pay | Admitting: Surgery

## 2014-05-18 ENCOUNTER — Ambulatory Visit (HOSPITAL_COMMUNITY)
Admission: RE | Admit: 2014-05-18 | Discharge: 2014-05-18 | Disposition: A | Discharge status: Home / Self Care | Payer: Medicare Other | Source: Ambulatory Visit | Attending: Surgery | Admitting: Surgery

## 2014-05-18 ENCOUNTER — Ambulatory Visit (INDEPENDENT_AMBULATORY_CARE_PROVIDER_SITE_OTHER)
Admission: RE | Admit: 2014-05-18 | Discharge: 2014-05-18 | Disposition: A | Discharge status: Home / Self Care | Payer: Medicare Other | Source: Ambulatory Visit | Attending: Surgery | Admitting: Surgery

## 2014-05-18 ENCOUNTER — Other Ambulatory Visit: Payer: Self-pay | Admitting: Surgery

## 2014-05-18 ENCOUNTER — Encounter: Payer: Self-pay | Admitting: Surgery

## 2014-05-18 ENCOUNTER — Ambulatory Visit (INDEPENDENT_AMBULATORY_CARE_PROVIDER_SITE_OTHER): Payer: Medicare Other | Admitting: Surgery

## 2014-05-18 VITALS — BP 150/63 | HR 73 | Ht 62.0 in | Wt 185.0 lb

## 2014-05-18 DIAGNOSIS — I739 Peripheral vascular disease, unspecified: Secondary | ICD-10-CM | POA: Diagnosis present

## 2014-05-18 DIAGNOSIS — Z48812 Encounter for surgical aftercare following surgery on the circulatory system: Secondary | ICD-10-CM

## 2014-05-18 DIAGNOSIS — R200 Anesthesia of skin: Secondary | ICD-10-CM

## 2014-05-18 DIAGNOSIS — R209 Unspecified disturbances of skin sensation: Secondary | ICD-10-CM

## 2014-05-18 NOTE — Progress Notes (Signed)
Patient name: [name] MRN: [id number] DOB: [birth year] Sex: female     Chief Complaint  Patient presents with  . Re-evaluation    1 year f/u     HISTORY OF PRESENT ILLNESS: The patient is back today for followup. She has a history of bilateral lower extremity bypass grafts done in the remote past. She developed a wound on her right leg. She was deemed to be too high risk by cardiology to proceed with revascularization and therefore she ultimately progressed to the point where she needed a right above-knee amputation.  He comes back for followup.  She is able to put some weight and stand up on her left leg.  She gets around primarily with a wheelchair.  She does report having to hang her foot over the edge the bed at night to help with some discomfort.  She denies any open wounds.   Past Medical History  Diagnosis Date  . DM (diabetes mellitus) type II controlled peripheral vascular disorder     A1C = 5.8  . HTN (hypertension)   . CHF (congestive heart failure)     EF = 62-83%, grade 2 diastolic dysfunction  . CAD (coronary artery disease)   . COPD (chronic obstructive pulmonary disease)   . Cataracts, bilateral   . Hyperlipidemia   . Peripheral vascular disease bilateral    S/P femoral-popliteal bypass surgery  . Glaucoma   . TIA (transient ischemic attack)   . Shortness of breath   . Pneumonia     HX OF PNA  . Depression   . GERD (gastroesophageal reflux disease)   . Arthritis     RA  . Anemia   . CKD (chronic kidney disease)   . DVT (deep venous thrombosis)   . Cancer     Rt breast    Past Surgical History  Procedure Laterality Date  . Coronary artery bypass graft    . Spine surgery    . Eye surgery  cataract  . Carpal tunnel release  left  . Breast surgery  lumpectomy right  . Aortogram w/ runoff  06/06/11    right leg  . Above knee leg amputation  07/27/11    Right AKA    History   Social History  . Marital Status: Widowed    Spouse Name: N/A     Number of Children: N/A  . Years of Education: N/A   Occupational History  . Not on file.   Social History Main Topics  . Smoking status: Former Smoker -- 1.00 packs/day for 40 years    Types: Cigarettes    Quit date: 04/13/1977  . Smokeless tobacco: Never Used  . Alcohol Use: No  . Drug Use: No  . Sexual Activity: No   Other Topics Concern  . Not on file   Social History Narrative  . No narrative on file    Family History  Problem Relation Age of Onset  . Hypertension Mother   . Peripheral vascular disease Mother   . Diabetes Father   . Kidney disease Sister   . Cancer Brother     lung  . Lung disease Sister   . Lung disease Sister   . Kidney disease Daughter     On dialysis, died in 2006-03-13    Allergies as of 05/18/2014 - Review Complete 05/18/2014  Allergen Reaction Noted  . Celebrex [celecoxib] Rash 05/31/2011  . Codeine Other (See Comments) 06/19/2011  . Iodine Rash 05/31/2011  .  Omnipaque [iohexol] Other (See Comments) 06/19/2011    Current Outpatient Prescriptions on File Prior to Visit  Medication Sig Dispense Refill  . allopurinol (ZYLOPRIM) 100 MG tablet Take 1 tablet (100 mg total) by mouth at bedtime.  30 tablet  1  . amLODipine (NORVASC) 10 MG tablet Take 1 tablet (10 mg total) by mouth daily.  30 tablet  1  . aspirin EC 81 MG tablet Take 81 mg by mouth daily.      . B Complex-C-Folic Acid (VOL-CARE RX PO) Take 1 tablet by mouth daily.      . capsaicin (ZOSTRIX) 0.025 % cream Apply topically 2 (two) times daily.  60 g  0  . carvedilol (COREG) 3.125 MG tablet Take 1 tablet (3.125 mg total) by mouth 2 (two) times daily with a meal.  60 tablet  1  . citalopram (CELEXA) 10 MG tablet Take 1 tablet (10 mg total) by mouth daily.  30 tablet  1  . ezetimibe (ZETIA) 10 MG tablet Take 1 tablet (10 mg total) by mouth daily.  30 tablet  1  . famotidine (PEPCID) 20 MG tablet Take 1 tablet (20 mg total) by mouth daily.  30 tablet  1  . furosemide (LASIX) 40 MG  tablet Take 1.5 tablets (60 mg total) by mouth 2 (two) times daily.  90 tablet  1  . hydrALAZINE (APRESOLINE) 100 MG tablet Take 1 tablet (100 mg total) by mouth 3 (three) times daily.  90 tablet  1  . iron polysaccharides (NIFEREX) 150 MG capsule Take 1 capsule (150 mg total) by mouth 2 (two) times daily.  60 capsule  1  . isosorbide mononitrate (IMDUR) 120 MG 24 hr tablet Take 1 tablet (120 mg total) by mouth daily.  30 tablet  1  . loratadine (CLARITIN) 10 MG tablet Take 1 tablet (10 mg total) by mouth daily.  30 tablet  1  . montelukast (SINGULAIR) 10 MG tablet Take 1 tablet (10 mg total) by mouth at bedtime.  30 tablet  1  . timolol (BETIMOL) 0.5 % ophthalmic solution Place 1 drop into both eyes 2 (two) times daily.      . traMADol (ULTRAM) 50 MG tablet Take 1 tablet (50 mg total) by mouth every 12 (twelve) hours as needed (1 to 2 tablets for pain not relieved by acetaminophen).  60 tablet  0   No current facility-administered medications on file prior to visit.     REVIEW OF SYSTEMS: Cardiovascular: No chest pain, chest pressure, palpitations, orthopnea, or dyspnea on exertion. Pulmonary: No productive cough, asthma or wheezing. Neurologic:  Right arm weakness Hematologic: No bleeding problems or clotting disorders. Musculoskeletal: Right above-knee amputation Gastrointestinal: No blood in stool or hematemesis Genitourinary: No dysuria or hematuria. Psychiatric:: No history of major depression. Integumentary: No rashes or ulcers. Constitutional: No fever or chills.  PHYSICAL EXAMINATION:   Vital signs are BP 150/63  Pulse 73  Ht 5\' 2"  (1.575 m)  Wt 185 lb (83.915 kg)  BMI 33.83 kg/m2  SpO2 98% General: The patient appears their stated age. HEENT:  No gross abnormalities Pulmonary:  Non labored breathing Musculoskeletal: Right above-knee amputation incision is nicely healed Neurologic: No focal weakness or paresthesias are detected, Skin: There are no ulcer or rashes  noted. Psychiatric: The patient has normal affect. Cardiovascular: Nonpalpable pedal pulses on the left.   Diagnostic Studies I have ordered and reviewed for ultrasound today.  Her ankle-brachial index has dropped from 0.71 down to 0.54.  There  appears to be significant inflow disease.  The femoral-popliteal bypass graft on the left is patent.  Assessment: Peripheral vascular disease Plan: There has been progression of the disease on the left.  She now has significant stenosis within the external iliac and common femoral artery on the left.  The patient remains somewhat asymptomatic from this.  She is not an operative candidate, and therefore without an active ulcer or significant rest pain, I am going to continue to monitor this.  She is scheduled to followup in a year.  She will contact me if she develops ulcer or significant pain that she cannot tolerate  V. Leia Alf, M.D. [facility] Office: [phone] Pager:  [phone]

## 2014-05-22 ENCOUNTER — Other Ambulatory Visit: Payer: Self-pay

## 2014-05-22 DIAGNOSIS — Z48812 Encounter for surgical aftercare following surgery on the circulatory system: Secondary | ICD-10-CM

## 2014-05-22 DIAGNOSIS — I739 Peripheral vascular disease, unspecified: Secondary | ICD-10-CM

## 2014-05-27 ENCOUNTER — Other Ambulatory Visit (HOSPITAL_COMMUNITY): Payer: Self-pay | Admitting: *Deleted

## 2014-05-28 ENCOUNTER — Encounter (HOSPITAL_COMMUNITY)
Admission: RE | Admit: 2014-05-28 | Discharge: 2014-05-28 | Disposition: A | Discharge status: Home / Self Care | Payer: Medicare Other | Source: Ambulatory Visit | Attending: Nephrology | Admitting: Nephrology

## 2014-05-28 DIAGNOSIS — N183 Chronic kidney disease, stage 3 unspecified: Secondary | ICD-10-CM | POA: Insufficient documentation

## 2014-05-28 DIAGNOSIS — D638 Anemia in other chronic diseases classified elsewhere: Secondary | ICD-10-CM | POA: Diagnosis not present

## 2014-05-28 LAB — FERRITIN: FERRITIN: 613 ng/mL — AB (ref 10–291)

## 2014-05-28 LAB — IRON AND TIBC
Iron: 68 ug/dL (ref 42–135)
SATURATION RATIOS: 35 % (ref 20–55)
TIBC: 196 ug/dL — ABNORMAL LOW (ref 250–470)
UIBC: 128 ug/dL (ref 125–400)

## 2014-05-28 LAB — POCT HEMOGLOBIN-HEMACUE: Hemoglobin: 10.1 g/dL — ABNORMAL LOW (ref 12.0–15.0)

## 2014-05-28 MED ORDER — EPOETIN ALFA 20000 UNIT/ML IJ SOLN
20000.0000 [IU] | INTRAMUSCULAR | Status: DC
  Administered 2014-05-28: 20000 [IU] via SUBCUTANEOUS

## 2014-05-28 MED ORDER — EPOETIN ALFA 20000 UNIT/ML IJ SOLN
INTRAMUSCULAR | Status: AC
Start: 2014-05-28 — End: 2014-05-28
  Filled 2014-05-28: qty 1

## 2014-06-10 ENCOUNTER — Other Ambulatory Visit (HOSPITAL_COMMUNITY): Payer: Self-pay | Admitting: Nurse Practitioner

## 2014-06-11 ENCOUNTER — Encounter (HOSPITAL_COMMUNITY)
Admission: RE | Admit: 2014-06-11 | Discharge: 2014-06-11 | Disposition: A | Discharge status: Home / Self Care | Payer: Medicare Other | Source: Ambulatory Visit | Attending: Nephrology | Admitting: Nephrology

## 2014-06-11 DIAGNOSIS — D638 Anemia in other chronic diseases classified elsewhere: Secondary | ICD-10-CM | POA: Diagnosis not present

## 2014-06-11 LAB — POCT HEMOGLOBIN-HEMACUE: HEMOGLOBIN: 10.6 g/dL — AB (ref 12.0–15.0)

## 2014-06-11 MED ORDER — EPOETIN ALFA 20000 UNIT/ML IJ SOLN
20000.0000 [IU] | INTRAMUSCULAR | Status: DC
  Administered 2014-06-11: 20000 [IU] via SUBCUTANEOUS

## 2014-06-11 MED ORDER — EPOETIN ALFA 20000 UNIT/ML IJ SOLN
INTRAMUSCULAR | Status: AC
  Filled 2014-06-11: qty 1

## 2014-06-20 IMAGING — CR DG CHEST 2V
2 series · 2 of 2 positions shown · non-contrast
Comparison: 04/18/2014

CLINICAL DATA: Increased productive cough, hypertension, diabetes,
CHF, COPD, coronary artery disease

EXAM:
CHEST  2 VIEW

[w chest lat]
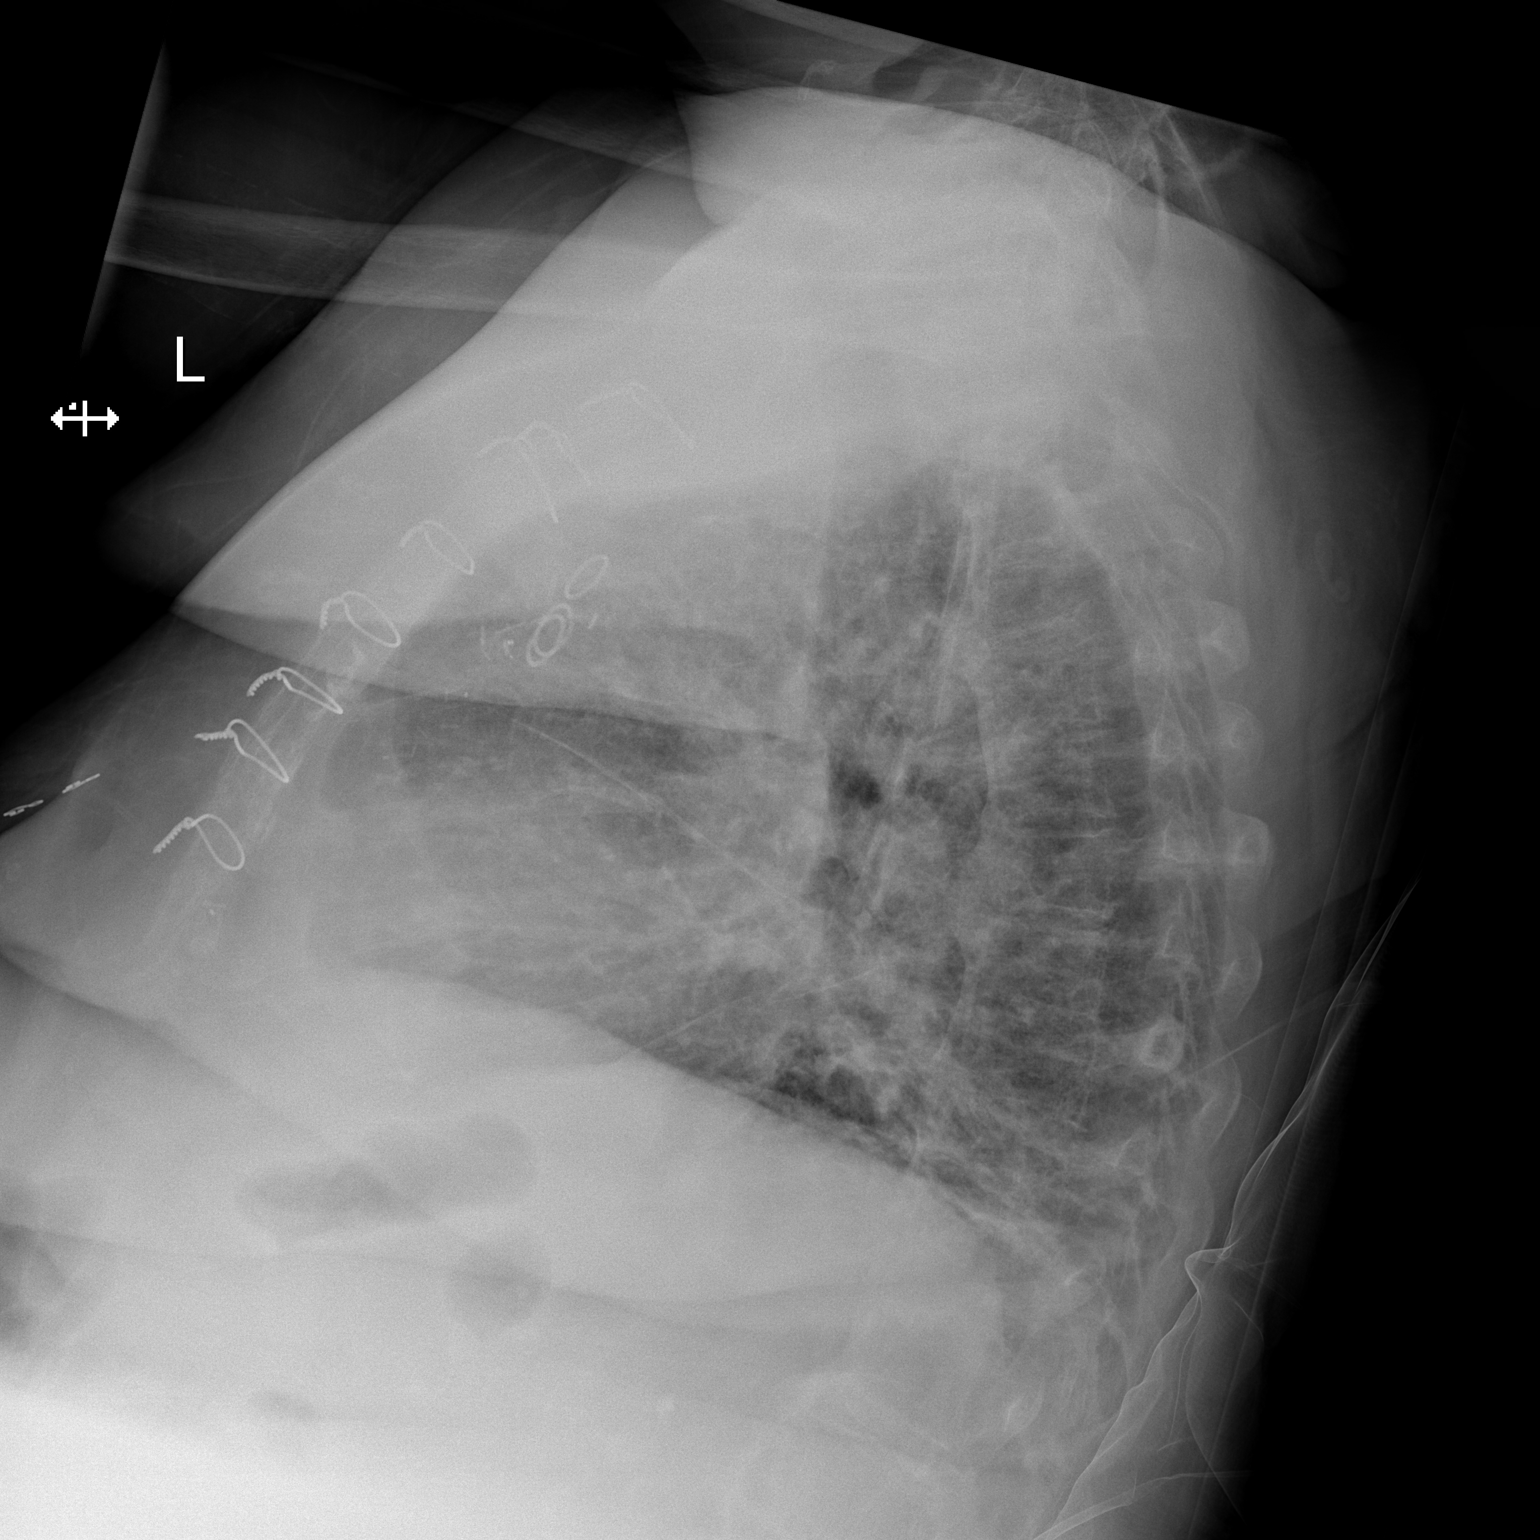

[x chest ap]
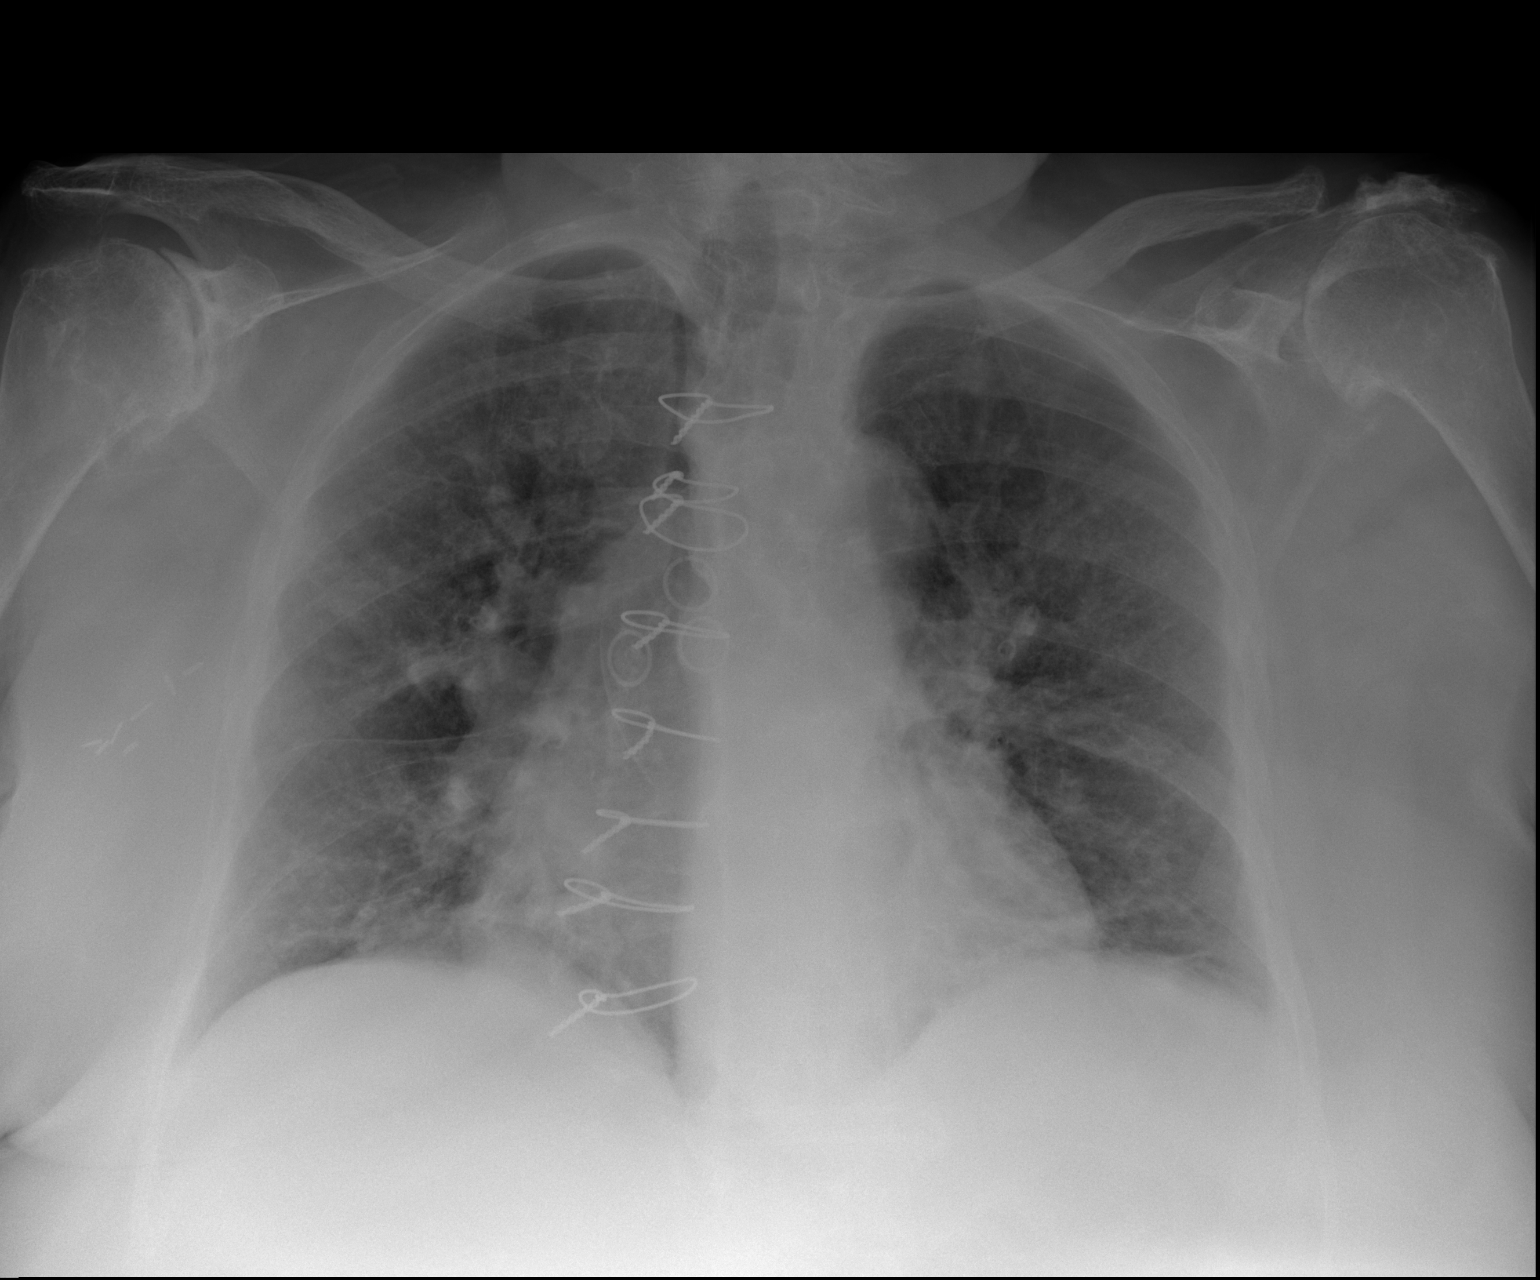

[2 of 2 positions shown; findings below may reference images not displayed]

FINDINGS: Enlargement of cardiac silhouette post CABG.

Calcified tortuous thoracic aorta.

Slight rotation to the RIGHT.

Pulmonary vascular congestion.

Improved pulmonary edema since previous exam.

Bibasilar atelectasis.

No pleural effusion or pneumothorax.

Advanced BILATERAL glenohumeral degenerative changes and LEFT
chronic rotator cuff tear.
IMPRESSION: Improved CHF with persistent mild edema and bibasilar atelectasis.

## 2014-06-24 ENCOUNTER — Encounter (HOSPITAL_COMMUNITY)
Admission: RE | Admit: 2014-06-24 | Discharge: 2014-06-24 | Disposition: A | Discharge status: Home / Self Care | Payer: Medicare Other | Source: Ambulatory Visit | Attending: Nephrology | Admitting: Nephrology

## 2014-06-24 DIAGNOSIS — N183 Chronic kidney disease, stage 3 unspecified: Secondary | ICD-10-CM | POA: Diagnosis not present

## 2014-06-24 DIAGNOSIS — D638 Anemia in other chronic diseases classified elsewhere: Secondary | ICD-10-CM | POA: Diagnosis present

## 2014-06-24 LAB — IRON AND TIBC
Iron: 88 ug/dL (ref 42–135)
Saturation Ratios: 44 % (ref 20–55)
TIBC: 199 ug/dL — ABNORMAL LOW (ref 250–470)
UIBC: 111 ug/dL — ABNORMAL LOW (ref 125–400)

## 2014-06-24 LAB — POCT HEMOGLOBIN-HEMACUE: Hemoglobin: 11.4 g/dL — ABNORMAL LOW (ref 12.0–15.0)

## 2014-06-24 LAB — FERRITIN: Ferritin: 438 ng/mL — ABNORMAL HIGH (ref 10–291)

## 2014-06-24 MED ORDER — EPOETIN ALFA 20000 UNIT/ML IJ SOLN: 20000.0000 [IU] | INTRAMUSCULAR | Status: DC

## 2014-06-24 MED ORDER — EPOETIN ALFA 20000 UNIT/ML IJ SOLN
INTRAMUSCULAR | Status: AC
  Administered 2014-06-24: 20000 [IU] via SUBCUTANEOUS
  Filled 2014-06-24: qty 1

## 2014-07-06 ENCOUNTER — Ambulatory Visit: Payer: Medicare Other | Admitting: Podiatry

## 2014-07-08 ENCOUNTER — Encounter (HOSPITAL_COMMUNITY): Payer: Medicare Other

## 2014-08-09 ENCOUNTER — Encounter: Payer: Self-pay | Admitting: *Deleted

## 2014-08-20 ENCOUNTER — Ambulatory Visit (INDEPENDENT_AMBULATORY_CARE_PROVIDER_SITE_OTHER): Payer: Medicare Other | Admitting: Podiatry

## 2014-08-20 ENCOUNTER — Encounter: Payer: Self-pay | Admitting: Podiatry

## 2014-08-20 DIAGNOSIS — B351 Tinea unguium: Secondary | ICD-10-CM | POA: Diagnosis not present

## 2014-08-20 DIAGNOSIS — M79673 Pain in unspecified foot: Secondary | ICD-10-CM

## 2014-08-20 NOTE — Progress Notes (Signed)
Subjective:     Patient ID: [name], female   DOB: Aug 03, 1927, 80 y.o.   MRN: [id number]  HPI patient presents with nail disease 1-5 left foot structural bunion with beginning to ulcer on the left first metatarsal but has not opened currently diabetic vascular and neurological disease and loss of right leg   Review of Systems     Objective:   Physical Exam At risk diabetic who has all diabetic shoes and nail disease 1-5 left that are painful thick yellow and brittle    Assessment:     Mycotic nail infection and at risk diabetic    Plan:     Debridement nailbeds 1-5 left foot and today will get permission for a diabetic shoe for her

## 2014-08-20 NOTE — Progress Notes (Signed)
   Subjective:    Patient ID: [name], female    DOB: [birth year], 80 y.o.   MRN: [id number]  HPI  Pt presents for nail debridement  Review of Systems     Objective:   Physical Exam        Assessment & Plan:

## 2014-11-19 ENCOUNTER — Other Ambulatory Visit: Payer: Medicare Other

## 2015-05-20 ENCOUNTER — Encounter: Payer: Self-pay | Admitting: Family

## 2015-05-24 ENCOUNTER — Other Ambulatory Visit (HOSPITAL_COMMUNITY): Payer: Medicare Other

## 2015-05-24 ENCOUNTER — Ambulatory Visit: Payer: Medicare Other | Admitting: Family

## 2015-05-24 ENCOUNTER — Encounter (HOSPITAL_COMMUNITY): Payer: Medicare Other

## 2015-05-31 ENCOUNTER — Encounter: Payer: Self-pay | Admitting: Family

## 2015-06-03 ENCOUNTER — Ambulatory Visit (INDEPENDENT_AMBULATORY_CARE_PROVIDER_SITE_OTHER)
Admission: RE | Admit: 2015-06-03 | Discharge: 2015-06-03 | Disposition: A | Discharge status: Home / Self Care | Payer: Medicare Other | Source: Ambulatory Visit | Attending: Surgery | Admitting: Surgery

## 2015-06-03 ENCOUNTER — Encounter: Payer: Self-pay | Admitting: Family

## 2015-06-03 ENCOUNTER — Ambulatory Visit (INDEPENDENT_AMBULATORY_CARE_PROVIDER_SITE_OTHER): Payer: Medicare Other | Admitting: Family

## 2015-06-03 ENCOUNTER — Other Ambulatory Visit: Payer: Self-pay | Admitting: Surgery

## 2015-06-03 VITALS — BP 137/66 | HR 52 | Temp 97.5°F | Resp 16 | Ht 61.0 in | Wt 180.0 lb

## 2015-06-03 DIAGNOSIS — E785 Hyperlipidemia, unspecified: Secondary | ICD-10-CM | POA: Diagnosis present

## 2015-06-03 DIAGNOSIS — Z8673 Personal history of transient ischemic attack (TIA), and cerebral infarction without residual deficits: Secondary | ICD-10-CM

## 2015-06-03 DIAGNOSIS — E1165 Type 2 diabetes mellitus with hyperglycemia: Secondary | ICD-10-CM | POA: Diagnosis present

## 2015-06-03 DIAGNOSIS — D696 Thrombocytopenia, unspecified: Secondary | ICD-10-CM | POA: Diagnosis present

## 2015-06-03 DIAGNOSIS — I739 Peripheral vascular disease, unspecified: Secondary | ICD-10-CM

## 2015-06-03 DIAGNOSIS — E1122 Type 2 diabetes mellitus with diabetic chronic kidney disease: Secondary | ICD-10-CM | POA: Diagnosis present

## 2015-06-03 DIAGNOSIS — Z48812 Encounter for surgical aftercare following surgery on the circulatory system: Secondary | ICD-10-CM

## 2015-06-03 DIAGNOSIS — M25512 Pain in left shoulder: Secondary | ICD-10-CM | POA: Diagnosis not present

## 2015-06-03 DIAGNOSIS — I779 Disorder of arteries and arterioles, unspecified: Secondary | ICD-10-CM

## 2015-06-03 DIAGNOSIS — I440 Atrioventricular block, first degree: Secondary | ICD-10-CM | POA: Diagnosis present

## 2015-06-03 DIAGNOSIS — F329 Major depressive disorder, single episode, unspecified: Secondary | ICD-10-CM | POA: Diagnosis present

## 2015-06-03 DIAGNOSIS — Z853 Personal history of malignant neoplasm of breast: Secondary | ICD-10-CM

## 2015-06-03 DIAGNOSIS — I5033 Acute on chronic diastolic (congestive) heart failure: Principal | ICD-10-CM | POA: Diagnosis present

## 2015-06-03 DIAGNOSIS — D631 Anemia in chronic kidney disease: Secondary | ICD-10-CM | POA: Diagnosis present

## 2015-06-03 DIAGNOSIS — Z87891 Personal history of nicotine dependence: Secondary | ICD-10-CM | POA: Diagnosis not present

## 2015-06-03 DIAGNOSIS — D509 Iron deficiency anemia, unspecified: Secondary | ICD-10-CM | POA: Diagnosis present

## 2015-06-03 DIAGNOSIS — I4581 Long QT syndrome: Secondary | ICD-10-CM | POA: Diagnosis present

## 2015-06-03 DIAGNOSIS — Z89611 Acquired absence of right leg above knee: Secondary | ICD-10-CM

## 2015-06-03 DIAGNOSIS — Z7982 Long term (current) use of aspirin: Secondary | ICD-10-CM

## 2015-06-03 DIAGNOSIS — J9601 Acute respiratory failure with hypoxia: Secondary | ICD-10-CM | POA: Diagnosis present

## 2015-06-03 DIAGNOSIS — N184 Chronic kidney disease, stage 4 (severe): Secondary | ICD-10-CM | POA: Diagnosis present

## 2015-06-03 DIAGNOSIS — Z9889 Other specified postprocedural states: Secondary | ICD-10-CM

## 2015-06-03 DIAGNOSIS — Z66 Do not resuscitate: Secondary | ICD-10-CM | POA: Diagnosis present

## 2015-06-03 DIAGNOSIS — E1151 Type 2 diabetes mellitus with diabetic peripheral angiopathy without gangrene: Secondary | ICD-10-CM | POA: Diagnosis present

## 2015-06-03 DIAGNOSIS — K219 Gastro-esophageal reflux disease without esophagitis: Secondary | ICD-10-CM | POA: Diagnosis present

## 2015-06-03 DIAGNOSIS — I129 Hypertensive chronic kidney disease with stage 1 through stage 4 chronic kidney disease, or unspecified chronic kidney disease: Secondary | ICD-10-CM | POA: Diagnosis present

## 2015-06-03 DIAGNOSIS — Z79899 Other long term (current) drug therapy: Secondary | ICD-10-CM

## 2015-06-03 DIAGNOSIS — I2510 Atherosclerotic heart disease of native coronary artery without angina pectoris: Secondary | ICD-10-CM | POA: Diagnosis present

## 2015-06-03 DIAGNOSIS — Z951 Presence of aortocoronary bypass graft: Secondary | ICD-10-CM

## 2015-06-03 DIAGNOSIS — Z6836 Body mass index (BMI) 36.0-36.9, adult: Secondary | ICD-10-CM

## 2015-06-03 DIAGNOSIS — Z993 Dependence on wheelchair: Secondary | ICD-10-CM

## 2015-06-03 DIAGNOSIS — Z95828 Presence of other vascular implants and grafts: Secondary | ICD-10-CM

## 2015-06-03 DIAGNOSIS — R001 Bradycardia, unspecified: Secondary | ICD-10-CM | POA: Diagnosis not present

## 2015-06-03 DIAGNOSIS — Z8701 Personal history of pneumonia (recurrent): Secondary | ICD-10-CM

## 2015-06-03 DIAGNOSIS — N179 Acute kidney failure, unspecified: Secondary | ICD-10-CM | POA: Diagnosis present

## 2015-06-03 DIAGNOSIS — J449 Chronic obstructive pulmonary disease, unspecified: Secondary | ICD-10-CM | POA: Diagnosis present

## 2015-06-03 DIAGNOSIS — J45909 Unspecified asthma, uncomplicated: Secondary | ICD-10-CM | POA: Diagnosis present

## 2015-06-03 DIAGNOSIS — J189 Pneumonia, unspecified organism: Secondary | ICD-10-CM | POA: Diagnosis not present

## 2015-06-03 NOTE — Patient Instructions (Signed)

## 2015-06-03 NOTE — Progress Notes (Signed)
[facility] HISTORY AND PHYSICAL -PAD  History of Present Illness [name] is a 81 y.o. female patient of Dr. Trula Slade who is s/p bilateral lower extremity bypass grafts done in the remote past. She developed a wound on her right leg. She was deemed to be too high risk by cardiology to proceed with revascularization and therefore she ultimately progressed to the point where she needed a right above-knee amputation. He comes back for followup. She is able to put some weight and stand up on her left leg. She gets around primarily with a wheelchair. She does report having to hang her foot over the edge the bed at night to help with some discomfort. She denies any open wounds in her left leg but does have a skin break in the crease of her right groin, states this is being treated with a cream medication. Pt is a resident of a rehabilitation center.   She was last seen by Dr.  Trula Slade on 05/18/14. At that time there had been progression of the disease on the left. She had significant stenosis within the external iliac and common femoral artery on the left. The patient remained somewhat asymptomatic from this. She is not an operative candidate, and therefore without an active ulcer or significant rest pain, Dr. Trula Slade decided to continue to monitor this. She was scheduled to followup in a year and is here today for a year follow up.  The patient denies New Medical or Surgical History.  Pt Diabetic: Yes, states she does not take insulin Pt smoker: former smoker, quit in February 24, 1977  Pt meds include: Statin :No Betablocker: Yes ASA: Yes Other anticoagulants/antiplatelets: no  Past Medical History  Diagnosis Date  . DM (diabetes mellitus) type II controlled peripheral vascular disorder     A1C = 5.8  . HTN (hypertension)   . CHF (congestive heart failure)     EF = 50-93%, grade 2 diastolic dysfunction  . CAD (coronary artery disease)   . COPD (chronic  obstructive pulmonary disease)   . Cataracts, bilateral   . Hyperlipidemia   . Peripheral vascular disease bilateral    S/P femoral-popliteal bypass surgery  . Glaucoma   . TIA (transient ischemic attack)   . Shortness of breath   . Pneumonia     HX OF PNA  . Depression   . GERD (gastroesophageal reflux disease)   . Arthritis     RA  . Anemia   . CKD (chronic kidney disease)   . DVT (deep venous thrombosis)   . Cancer     Rt breast    Social History History  Substance Use Topics  . Smoking status: Former Smoker -- 1.00 packs/day for 40 years    Types: Cigarettes    Quit date: 04/13/1977  . Smokeless tobacco: Never Used  . Alcohol Use: No    Family History Family History  Problem Relation Age of Onset  . Hypertension Mother   . Peripheral vascular disease Mother   . Diabetes Father   . Kidney disease Sister   . Cancer Brother     lung  . Lung disease Sister   . Lung disease Sister   . Kidney disease Daughter     On dialysis, died in 02/24/06    Past Surgical History  Procedure Laterality Date  . Coronary artery bypass graft    . Spine surgery    . Eye surgery  cataract  . Carpal tunnel release  left  .  Breast surgery  lumpectomy right  . Aortogram w/ runoff  06/06/11    right leg  . Above knee leg amputation  07/27/11    Right AKA    Allergies  Allergen Reactions  . Celebrex [Celecoxib] Rash  . Codeine Other (See Comments)    REACTION: unknown  . Iodine Rash  . Omnipaque [Iohexol] Other (See Comments)    REACTION: unknown    Current Outpatient Prescriptions  Medication Sig Dispense Refill  . allopurinol (ZYLOPRIM) 100 MG tablet Take 1 tablet (100 mg total) by mouth at bedtime. 30 tablet 1  . amLODipine (NORVASC) 10 MG tablet Take 1 tablet (10 mg total) by mouth daily. 30 tablet 1  . aspirin EC 81 MG tablet Take 81 mg by mouth daily.    . B Complex-C-Folic Acid (VOL-CARE RX PO) Take 1 tablet by mouth daily.    . capsaicin (ZOSTRIX) 0.025 % cream  Apply topically 2 (two) times daily. 60 g 0  . carvedilol (COREG) 3.125 MG tablet Take 1 tablet (3.125 mg total) by mouth 2 (two) times daily with a meal. 60 tablet 1  . citalopram (CELEXA) 10 MG tablet Take 1 tablet (10 mg total) by mouth daily. 30 tablet 1  . ezetimibe (ZETIA) 10 MG tablet Take 1 tablet (10 mg total) by mouth daily. 30 tablet 1  . famotidine (PEPCID) 20 MG tablet Take 1 tablet (20 mg total) by mouth daily. 30 tablet 1  . furosemide (LASIX) 40 MG tablet Take 1.5 tablets (60 mg total) by mouth 2 (two) times daily. 90 tablet 1  . hydrALAZINE (APRESOLINE) 100 MG tablet Take 1 tablet (100 mg total) by mouth 3 (three) times daily. 90 tablet 1  . iron polysaccharides (NIFEREX) 150 MG capsule Take 1 capsule (150 mg total) by mouth 2 (two) times daily. 60 capsule 1  . isosorbide mononitrate (IMDUR) 120 MG 24 hr tablet Take 1 tablet (120 mg total) by mouth daily. 30 tablet 1  . loratadine (CLARITIN) 10 MG tablet Take 1 tablet (10 mg total) by mouth daily. 30 tablet 1  . montelukast (SINGULAIR) 10 MG tablet Take 1 tablet (10 mg total) by mouth at bedtime. 30 tablet 1  . timolol (BETIMOL) 0.5 % ophthalmic solution Place 1 drop into both eyes 2 (two) times daily.    . traMADol (ULTRAM) 50 MG tablet Take 1 tablet (50 mg total) by mouth every 12 (twelve) hours as needed (1 to 2 tablets for pain not relieved by acetaminophen). 60 tablet 0   No current facility-administered medications for this visit.    ROS: See HPI for pertinent positives and negatives.   Physical Examination  Filed Vitals:   06/03/15 1409  BP: 137/66  Pulse: 52  Temp: 97.5 F (36.4 C)  TempSrc: Oral  Resp: 16  Height: 5\' 1"  (1.549 m)  Weight: 180 lb (81.647 kg)  SpO2: 95%   Body mass index is 34.03 kg/(m^2).  General: A&O x 3, WDWN, obese female. Gait: seated in wheelchair Eyes: PERRLA. Pulmonary: CTAB, without wheezes , rales or rhonchi. Cardiac: regular Rythm , without detected murmur.          Carotid Bruits Right Left   Negative Negative  Aorta is not palpable. Radial pulses: 2+ bilaterally                           VASCULAR EXAM: Extremities without ischemic changes, without Gangrene; without open wounds. Right AKA stump with no  lesions. Left foot has a bunion.                                                                                                          LE Pulses Right Left       FEMORAL  not palpable seated in wheelchair, obese  not palpable seated in wheelchair, obese        POPLITEAL  AKA   not palpable       POSTERIOR TIBIAL  AKA   not palpable        DORSALIS PEDIS      ANTERIOR TIBIAL AKA  not palpable    Abdomen: soft, obese, NT, no palpable masses. Skin: no rashes, no ulcers. Break in dermal layer at crease of right groin, no surrounding erythema, no drainage; pt states this is under treatment. Musculoskeletal: Mild generalized muscle wasting and atrophy, see Extremities.  Neurologic: A&O X 3; Appropriate Affect; MOTOR FUNCTION:  moving all extremities equally, motor strength 4/5 throughout. Speech is fluent/normal. CN 2-12 intact.    Non-Invasive Vascular Imaging: DATE: 06/03/2015 LOWER EXTREMITY ARTERIAL DUPLEX EVALUATION    INDICATION: Follow-up left lower extremity bypass graft     PREVIOUS INTERVENTION(S): Left femoropopliteal arterial bypass graft placed 11/23/2005 Right AKA 07/27/2011    DUPLEX EXAM:     RIGHT  LEFT   Peak Systolic Velocity (cm/s) Ratio (if abnormal) Waveform  Peak Systolic Velocity (cm/s) Ratio (if abnormal) Waveform     Inflow Artery 137  M     Proximal Anastomosis 34  M     Proximal Graft 36  M     Mid Graft 40  M      Distal Graft 36  M     Distal Anastomosis 20  M     Outflow Artery 60  M   Today's ABI / TBI Unreliable    Previous ABI / TBI (  ) 0.54    Waveform:    M - Monophasic       B - Biphasic       T - Triphasic  If Ankle Brachial Index (ABI) or Toe Brachial Index (TBI) performed, please see  complete report  ADDITIONAL FINDINGS:     IMPRESSION: Patent graft with dampened monophasic waveforms throughout the left lower extremity suggestive of significant stenosis or occlusion of the aortoiliac system.    Compared to the previous exam:  Velocities throughout the graft have slightly decreased compared to previous exam.     ASSESSMENT: [name] is a 81 y.o. female who is s/p left femoropopliteal arterial bypass graft on 11/23/2005 and right AKA on 07/27/2011. She is mostly wheelchair bound, was able to stand to be weighed. She is a resident of a rehabilitation facility.  Her DM seems in control, she quit smoking in the 1970's.  She rarely walks, has no rest pain in her lower extremities. She was last seen by Dr.  Trula Slade on 05/18/14. At that time there had been progression of the disease on the left. She had significant stenosis within the external  iliac and common femoral artery on the left. The patient remained somewhat asymptomatic from this. She is not an operative candidate, and therefore without an active ulcer or significant rest pain, Dr. Trula Slade decided to continue to monitor this. She was scheduled to followup in a year and is here today for a year follow up. She has no ulcers on her left lower extremity, no gangrene, no non healing wounds. Today's left LE arterial Duplex suggests a patent graft with dampened monophasic waveforms throughout the left lower extremity suggestive of significant stenosis or occlusion of the aortoiliac system. Velocities throughout the graft have slightly decreased compared to previous exam. Left toe pressure could not be obtained, absent waveform.   PLAN:  Seated left leg exercises 6 days/week as discussed and demonstrated with pt and pt attendant.   Based on the patient's vascular studies and examination, pt will return to clinic in 3 months with left LE ABI and left LE arterial Duplex, follow up with Dr. Trula Slade. She knows to return  sooner should she develop non healing wounds in her left leg or right AKA stump, or has rest pain in her left leg.  I discussed in depth with the patient the nature of atherosclerosis, and emphasized the importance of maximal medical management including strict control of blood pressure, blood glucose, and lipid levels, obtaining regular exercise, and continued cessation of smoking.  The patient is aware that without maximal medical management the underlying atherosclerotic disease process will progress, limiting the benefit of any interventions.  The patient was given information about PAD including signs, symptoms, treatment, what symptoms should prompt the patient to seek immediate medical care, and risk reduction measures to take.  Clemon Chambers, RN, MSN, FNP-C [facility] Phone: [phone]  Clinic MD: Oneida Alar  06/03/2015 2:06 PM

## 2015-06-04 ENCOUNTER — Emergency Department (HOSPITAL_COMMUNITY): Payer: Medicare Other

## 2015-06-04 ENCOUNTER — Inpatient Hospital Stay (HOSPITAL_COMMUNITY)
Admission: EM | Admit: 2015-06-04 | Discharge: 2015-06-11 | DRG: 291 | Disposition: A | Discharge status: Home / Self Care | Payer: Medicare Other | Attending: Internal Medicine | Admitting: Internal Medicine

## 2015-06-04 ENCOUNTER — Encounter (HOSPITAL_COMMUNITY): Payer: Self-pay | Admitting: Emergency Medicine

## 2015-06-04 DIAGNOSIS — Z8673 Personal history of transient ischemic attack (TIA), and cerebral infarction without residual deficits: Secondary | ICD-10-CM | POA: Diagnosis not present

## 2015-06-04 DIAGNOSIS — Z853 Personal history of malignant neoplasm of breast: Secondary | ICD-10-CM | POA: Diagnosis not present

## 2015-06-04 DIAGNOSIS — E872 Acidosis, unspecified: Secondary | ICD-10-CM | POA: Insufficient documentation

## 2015-06-04 DIAGNOSIS — I10 Essential (primary) hypertension: Secondary | ICD-10-CM | POA: Diagnosis not present

## 2015-06-04 DIAGNOSIS — K219 Gastro-esophageal reflux disease without esophagitis: Secondary | ICD-10-CM | POA: Diagnosis present

## 2015-06-04 DIAGNOSIS — Z89611 Acquired absence of right leg above knee: Secondary | ICD-10-CM | POA: Diagnosis not present

## 2015-06-04 DIAGNOSIS — J449 Chronic obstructive pulmonary disease, unspecified: Secondary | ICD-10-CM | POA: Diagnosis present

## 2015-06-04 DIAGNOSIS — J189 Pneumonia, unspecified organism: Secondary | ICD-10-CM

## 2015-06-04 DIAGNOSIS — R05 Cough: Secondary | ICD-10-CM

## 2015-06-04 DIAGNOSIS — Z6836 Body mass index (BMI) 36.0-36.9, adult: Secondary | ICD-10-CM | POA: Diagnosis not present

## 2015-06-04 DIAGNOSIS — D509 Iron deficiency anemia, unspecified: Secondary | ICD-10-CM | POA: Diagnosis present

## 2015-06-04 DIAGNOSIS — Z8701 Personal history of pneumonia (recurrent): Secondary | ICD-10-CM | POA: Diagnosis not present

## 2015-06-04 DIAGNOSIS — Z87891 Personal history of nicotine dependence: Secondary | ICD-10-CM | POA: Diagnosis not present

## 2015-06-04 DIAGNOSIS — N179 Acute kidney failure, unspecified: Secondary | ICD-10-CM | POA: Diagnosis not present

## 2015-06-04 DIAGNOSIS — I4581 Long QT syndrome: Secondary | ICD-10-CM | POA: Diagnosis present

## 2015-06-04 DIAGNOSIS — I2510 Atherosclerotic heart disease of native coronary artery without angina pectoris: Secondary | ICD-10-CM | POA: Diagnosis present

## 2015-06-04 DIAGNOSIS — M25512 Pain in left shoulder: Secondary | ICD-10-CM | POA: Diagnosis not present

## 2015-06-04 DIAGNOSIS — J45909 Unspecified asthma, uncomplicated: Secondary | ICD-10-CM | POA: Diagnosis present

## 2015-06-04 DIAGNOSIS — Z66 Do not resuscitate: Secondary | ICD-10-CM | POA: Diagnosis present

## 2015-06-04 DIAGNOSIS — D631 Anemia in chronic kidney disease: Secondary | ICD-10-CM | POA: Diagnosis present

## 2015-06-04 DIAGNOSIS — I5032 Chronic diastolic (congestive) heart failure: Secondary | ICD-10-CM | POA: Diagnosis not present

## 2015-06-04 DIAGNOSIS — Z993 Dependence on wheelchair: Secondary | ICD-10-CM | POA: Diagnosis not present

## 2015-06-04 DIAGNOSIS — R0902 Hypoxemia: Secondary | ICD-10-CM

## 2015-06-04 DIAGNOSIS — J9601 Acute respiratory failure with hypoxia: Secondary | ICD-10-CM | POA: Diagnosis not present

## 2015-06-04 DIAGNOSIS — R001 Bradycardia, unspecified: Secondary | ICD-10-CM | POA: Diagnosis not present

## 2015-06-04 DIAGNOSIS — I440 Atrioventricular block, first degree: Secondary | ICD-10-CM | POA: Diagnosis present

## 2015-06-04 DIAGNOSIS — R059 Cough, unspecified: Secondary | ICD-10-CM

## 2015-06-04 DIAGNOSIS — N184 Chronic kidney disease, stage 4 (severe): Secondary | ICD-10-CM | POA: Diagnosis not present

## 2015-06-04 DIAGNOSIS — Z951 Presence of aortocoronary bypass graft: Secondary | ICD-10-CM | POA: Diagnosis not present

## 2015-06-04 DIAGNOSIS — I129 Hypertensive chronic kidney disease with stage 1 through stage 4 chronic kidney disease, or unspecified chronic kidney disease: Secondary | ICD-10-CM | POA: Diagnosis present

## 2015-06-04 DIAGNOSIS — Z89619 Acquired absence of unspecified leg above knee: Secondary | ICD-10-CM

## 2015-06-04 DIAGNOSIS — D696 Thrombocytopenia, unspecified: Secondary | ICD-10-CM | POA: Diagnosis present

## 2015-06-04 DIAGNOSIS — Z79899 Other long term (current) drug therapy: Secondary | ICD-10-CM | POA: Diagnosis not present

## 2015-06-04 DIAGNOSIS — Z7982 Long term (current) use of aspirin: Secondary | ICD-10-CM | POA: Diagnosis not present

## 2015-06-04 DIAGNOSIS — J42 Unspecified chronic bronchitis: Secondary | ICD-10-CM | POA: Diagnosis not present

## 2015-06-04 DIAGNOSIS — E1151 Type 2 diabetes mellitus with diabetic peripheral angiopathy without gangrene: Secondary | ICD-10-CM | POA: Diagnosis present

## 2015-06-04 DIAGNOSIS — E1165 Type 2 diabetes mellitus with hyperglycemia: Secondary | ICD-10-CM | POA: Diagnosis present

## 2015-06-04 DIAGNOSIS — I5033 Acute on chronic diastolic (congestive) heart failure: Secondary | ICD-10-CM | POA: Diagnosis present

## 2015-06-04 DIAGNOSIS — E1122 Type 2 diabetes mellitus with diabetic chronic kidney disease: Secondary | ICD-10-CM | POA: Diagnosis present

## 2015-06-04 DIAGNOSIS — E785 Hyperlipidemia, unspecified: Secondary | ICD-10-CM | POA: Diagnosis present

## 2015-06-04 DIAGNOSIS — R509 Fever, unspecified: Secondary | ICD-10-CM | POA: Diagnosis not present

## 2015-06-04 DIAGNOSIS — F329 Major depressive disorder, single episode, unspecified: Secondary | ICD-10-CM | POA: Diagnosis present

## 2015-06-04 HISTORY — DX: Hypoxemia: R09.02

## 2015-06-04 LAB — COMPREHENSIVE METABOLIC PANEL
ALBUMIN: 3.6 g/dL (ref 3.5–5.0)
ALK PHOS: 80 U/L (ref 38–126)
ALT: 8 U/L — ABNORMAL LOW (ref 14–54)
ANION GAP: 13 (ref 5–15)
AST: 24 U/L (ref 15–41)
BUN: 106 mg/dL — AB (ref 6–20)
CO2: 21 mmol/L — AB (ref 22–32)
Calcium: 8.8 mg/dL — ABNORMAL LOW (ref 8.9–10.3)
Chloride: 106 mmol/L (ref 101–111)
Creatinine, Ser: 2.78 mg/dL — ABNORMAL HIGH (ref 0.44–1.00)
GFR calc non Af Amer: 14 mL/min — ABNORMAL LOW (ref >60)
GFR, EST AFRICAN AMERICAN: 17 mL/min — AB (ref >60)
GLUCOSE: 146 mg/dL — AB (ref 65–99)
Potassium: 4.5 mmol/L (ref 3.5–5.1)
SODIUM: 140 mmol/L (ref 135–145)
Total Bilirubin: 0.4 mg/dL (ref 0.3–1.2)
Total Protein: 7.1 g/dL (ref 6.5–8.1)

## 2015-06-04 LAB — CBC WITH DIFFERENTIAL/PLATELET
BASOS PCT: 0 % (ref 0–1)
Basophils Absolute: 0 10*3/uL (ref 0.0–0.1)
Eosinophils Absolute: 0.2 10*3/uL (ref 0.0–0.7)
Eosinophils Relative: 1 % (ref 0–5)
HCT: 31.5 % — ABNORMAL LOW (ref 36.0–46.0)
Hemoglobin: 10.2 g/dL — ABNORMAL LOW (ref 12.0–15.0)
LYMPHS ABS: 1.2 10*3/uL (ref 0.7–4.0)
Lymphocytes Relative: 10 % — ABNORMAL LOW (ref 12–46)
MCH: 23 pg — ABNORMAL LOW (ref 26.0–34.0)
MCHC: 32.4 g/dL (ref 30.0–36.0)
MCV: 71.1 fL — ABNORMAL LOW (ref 78.0–100.0)
Monocytes Absolute: 0.5 10*3/uL (ref 0.1–1.0)
Monocytes Relative: 4 % (ref 3–12)
NEUTROS ABS: 9.9 10*3/uL — AB (ref 1.7–7.7)
Neutrophils Relative %: 85 % — ABNORMAL HIGH (ref 43–77)
Platelets: 205 10*3/uL (ref 150–400)
RBC: 4.43 MIL/uL (ref 3.87–5.11)
RDW: 17.5 % — ABNORMAL HIGH (ref 11.5–15.5)
WBC: 11.7 10*3/uL — ABNORMAL HIGH (ref 4.0–10.5)

## 2015-06-04 LAB — URINALYSIS, ROUTINE W REFLEX MICROSCOPIC
BILIRUBIN URINE: NEGATIVE
Glucose, UA: NEGATIVE mg/dL
Hgb urine dipstick: NEGATIVE
KETONES UR: NEGATIVE mg/dL
Nitrite: NEGATIVE
Protein, ur: NEGATIVE mg/dL
SPECIFIC GRAVITY, URINE: 1.01 (ref 1.005–1.030)
UROBILINOGEN UA: 0.2 mg/dL (ref 0.0–1.0)
pH: 5 (ref 5.0–8.0)

## 2015-06-04 LAB — BRAIN NATRIURETIC PEPTIDE: B NATRIURETIC PEPTIDE 5: 308.6 pg/mL — AB (ref 0.0–100.0)

## 2015-06-04 LAB — POC OCCULT BLOOD, ED: FECAL OCCULT BLD: NEGATIVE

## 2015-06-04 LAB — URINE MICROSCOPIC-ADD ON

## 2015-06-04 LAB — I-STAT CG4 LACTIC ACID, ED: LACTIC ACID, VENOUS: 1.36 mmol/L (ref 0.5–2.0)

## 2015-06-04 LAB — GLUCOSE, CAPILLARY
GLUCOSE-CAPILLARY: 133 mg/dL — AB (ref 65–99)
Glucose-Capillary: 120 mg/dL — ABNORMAL HIGH (ref 65–99)

## 2015-06-04 LAB — STREP PNEUMONIAE URINARY ANTIGEN: STREP PNEUMO URINARY ANTIGEN: NEGATIVE

## 2015-06-04 LAB — PROCALCITONIN: Procalcitonin: <0.1 ng/mL

## 2015-06-04 MED ORDER — ISOSORBIDE MONONITRATE ER 60 MG PO TB24
120.0000 mg | ORAL_TABLET | Freq: Every day | ORAL | Status: DC
  Administered 2015-06-04 – 2015-06-11 (×8): 120 mg via ORAL
  Filled 2015-06-04 (×8): qty 2

## 2015-06-04 MED ORDER — EZETIMIBE 10 MG PO TABS
10.0000 mg | ORAL_TABLET | Freq: Every day | ORAL | Status: DC
  Administered 2015-06-04 – 2015-06-11 (×8): 10 mg via ORAL
  Filled 2015-06-04 (×8): qty 1

## 2015-06-04 MED ORDER — ASPIRIN EC 81 MG PO TBEC
81.0000 mg | DELAYED_RELEASE_TABLET | Freq: Every day | ORAL | Status: DC
  Administered 2015-06-04 – 2015-06-11 (×8): 81 mg via ORAL
  Filled 2015-06-04 (×8): qty 1

## 2015-06-04 MED ORDER — DEXTROSE 5 % IV SOLN: 2.0000 g | Freq: Three times a day (TID) | INTRAVENOUS | Status: DC

## 2015-06-04 MED ORDER — SENNA-DOCUSATE SODIUM 8.6-50 MG PO TABS: 1.0000 | ORAL_TABLET | Freq: Every day | ORAL | Status: DC | PRN

## 2015-06-04 MED ORDER — CARVEDILOL 3.125 MG PO TABS
3.1250 mg | ORAL_TABLET | Freq: Two times a day (BID) | ORAL | Status: DC
  Administered 2015-06-04 – 2015-06-07 (×6): 3.125 mg via ORAL
  Filled 2015-06-04 (×7): qty 1

## 2015-06-04 MED ORDER — VANCOMYCIN HCL IN DEXTROSE 1-5 GM/200ML-% IV SOLN: 1000.0000 mg | INTRAVENOUS | Status: DC

## 2015-06-04 MED ORDER — HYDRALAZINE HCL 100 MG PO TABS: 100.0000 mg | ORAL_TABLET | Freq: Three times a day (TID) | ORAL | Status: DC

## 2015-06-04 MED ORDER — INSULIN ASPART 100 UNIT/ML ~~LOC~~ SOLN
0.0000 [IU] | Freq: Three times a day (TID) | SUBCUTANEOUS | Status: DC
  Administered 2015-06-05 – 2015-06-06 (×2): 2 [IU] via SUBCUTANEOUS
  Administered 2015-06-07: 1 [IU] via SUBCUTANEOUS
  Administered 2015-06-07: 2 [IU] via SUBCUTANEOUS
  Administered 2015-06-09 – 2015-06-11 (×4): 1 [IU] via SUBCUTANEOUS

## 2015-06-04 MED ORDER — DARBEPOETIN ALFA 100 MCG/0.5ML IJ SOSY
100.0000 ug | PREFILLED_SYRINGE | INTRAMUSCULAR | Status: DC
  Administered 2015-06-04: 100 ug via SUBCUTANEOUS
  Filled 2015-06-04: qty 0.5

## 2015-06-04 MED ORDER — VANCOMYCIN HCL IN DEXTROSE 1-5 GM/200ML-% IV SOLN
1000.0000 mg | Freq: Once | INTRAVENOUS | Status: AC
  Administered 2015-06-04: 1000 mg via INTRAVENOUS
  Filled 2015-06-04: qty 200

## 2015-06-04 MED ORDER — SODIUM CHLORIDE 0.9 % IV SOLN: INTRAVENOUS | Status: DC

## 2015-06-04 MED ORDER — CITALOPRAM HYDROBROMIDE 20 MG PO TABS
20.0000 mg | ORAL_TABLET | Freq: Every day | ORAL | Status: DC
  Administered 2015-06-04 – 2015-06-11 (×8): 20 mg via ORAL
  Filled 2015-06-04 (×8): qty 1

## 2015-06-04 MED ORDER — HYDRALAZINE HCL 50 MG PO TABS
100.0000 mg | ORAL_TABLET | Freq: Three times a day (TID) | ORAL | Status: DC
  Administered 2015-06-04 – 2015-06-11 (×11): 100 mg via ORAL
  Filled 2015-06-04 (×18): qty 2

## 2015-06-04 MED ORDER — INSULIN ASPART 100 UNIT/ML ~~LOC~~ SOLN: 0.0000 [IU] | Freq: Every day | SUBCUTANEOUS | Status: DC

## 2015-06-04 MED ORDER — HEPARIN SODIUM (PORCINE) 1000 UNIT/ML DIALYSIS
1000.0000 [IU] | INTRAMUSCULAR | Status: DC | PRN
Start: 2015-06-04 — End: 2015-06-04
  Filled 2015-06-04: qty 6

## 2015-06-04 MED ORDER — CLONIDINE HCL 0.1 MG PO TABS: 0.1000 mg | ORAL_TABLET | Freq: Once | ORAL | Status: AC | PRN

## 2015-06-04 MED ORDER — DEXTROSE 5 % IV SOLN
2.0000 g | Freq: Once | INTRAVENOUS | Status: AC
  Administered 2015-06-04: 2 g via INTRAVENOUS
  Filled 2015-06-04: qty 2

## 2015-06-04 MED ORDER — TIMOLOL HEMIHYDRATE 0.5 % OP SOLN: 1.0000 [drp] | Freq: Two times a day (BID) | OPHTHALMIC | Status: DC

## 2015-06-04 MED ORDER — MONTELUKAST SODIUM 10 MG PO TABS
10.0000 mg | ORAL_TABLET | Freq: Every day | ORAL | Status: DC
  Administered 2015-06-04 – 2015-06-10 (×7): 10 mg via ORAL
  Filled 2015-06-04 (×7): qty 1

## 2015-06-04 MED ORDER — ALLOPURINOL 100 MG PO TABS
100.0000 mg | ORAL_TABLET | Freq: Every day | ORAL | Status: DC
  Administered 2015-06-04 – 2015-06-10 (×7): 100 mg via ORAL
  Filled 2015-06-04 (×7): qty 1

## 2015-06-04 MED ORDER — AMLODIPINE BESYLATE 10 MG PO TABS
10.0000 mg | ORAL_TABLET | Freq: Every day | ORAL | Status: DC
  Administered 2015-06-04 – 2015-06-11 (×6): 10 mg via ORAL
  Filled 2015-06-04 (×8): qty 1

## 2015-06-04 MED ORDER — TIMOLOL MALEATE 0.5 % OP SOLN
1.0000 [drp] | Freq: Two times a day (BID) | OPHTHALMIC | Status: DC
  Administered 2015-06-04 – 2015-06-11 (×14): 1 [drp] via OPHTHALMIC
  Filled 2015-06-04 (×2): qty 5

## 2015-06-04 MED ORDER — DEXTROSE 5 % IV SOLN
1.0000 g | INTRAVENOUS | Status: DC
  Filled 2015-06-04: qty 1

## 2015-06-04 MED ORDER — FAMOTIDINE 20 MG PO TABS
20.0000 mg | ORAL_TABLET | Freq: Every day | ORAL | Status: DC
  Administered 2015-06-04 – 2015-06-11 (×8): 20 mg via ORAL
  Filled 2015-06-04 (×8): qty 1

## 2015-06-04 MED ORDER — POLYETHYLENE GLYCOL 3350 17 G PO PACK
17.0000 g | PACK | Freq: Every day | ORAL | Status: DC | PRN
  Administered 2015-06-11: 17 g via ORAL
  Filled 2015-06-04: qty 1

## 2015-06-04 MED ORDER — HEPARIN SODIUM (PORCINE) 5000 UNIT/ML IJ SOLN
5000.0000 [IU] | Freq: Three times a day (TID) | INTRAMUSCULAR | Status: DC
Start: 2015-06-04 — End: 2015-06-11
  Administered 2015-06-04 – 2015-06-11 (×21): 5000 [IU] via SUBCUTANEOUS
  Filled 2015-06-04 (×19): qty 1

## 2015-06-04 MED ORDER — LORATADINE 10 MG PO TABS
10.0000 mg | ORAL_TABLET | Freq: Every day | ORAL | Status: DC
  Administered 2015-06-04 – 2015-06-11 (×8): 10 mg via ORAL
  Filled 2015-06-04 (×8): qty 1

## 2015-06-04 MED ORDER — POLYSACCHARIDE IRON COMPLEX 150 MG PO CAPS
150.0000 mg | ORAL_CAPSULE | Freq: Two times a day (BID) | ORAL | Status: DC
  Administered 2015-06-04 – 2015-06-11 (×14): 150 mg via ORAL
  Filled 2015-06-04 (×15): qty 1

## 2015-06-04 MED ORDER — TRAMADOL HCL 50 MG PO TABS
50.0000 mg | ORAL_TABLET | Freq: Four times a day (QID) | ORAL | Status: DC | PRN
Start: 2015-06-04 — End: 2015-06-08
  Administered 2015-06-05 – 2015-06-08 (×3): 50 mg via ORAL
  Filled 2015-06-04 (×3): qty 1

## 2015-06-04 NOTE — ED Notes (Signed)
Pt in xray

## 2015-06-04 NOTE — Progress Notes (Signed)
Admitted patient from E.D.,in stretcher,awake,alert ,oriented X 4 ,on oxygen at 3 lpm/,with continous pulse oximeter,placed on telemetry.Skin issues are 1.skin tear on left lower abdominal fold-0.3 cm x 4 cm 2.Two newly healed wound on her left buttock-2.5.com x 2.5 cm and 2.5 cm. X 2cm .

## 2015-06-04 NOTE — ED Notes (Signed)
Pt coughing up blood tinged sputum. MD notified.

## 2015-06-04 NOTE — ED Notes (Signed)
Pt O2 sats on RA dropped to 85%. Placed on 2L O2 nasal cannula, sats up to 92% initially then dipped to 83%. Pt placed back on 4L O2.

## 2015-06-04 NOTE — ED Provider Notes (Signed)
CSN: [id number]     Arrival date & time 06/04/15  0847 History   First MD Initiated Contact with Patient 06/04/15 [phone]     Chief Complaint  Patient presents with  . Chest Pain     (Consider location/radiation/quality/duration/timing/severity/associated sxs/prior Treatment) HPI History is obtained from patient, records accompany patient and from Javier Glazier, nurse at assisted living facility. Patient complained of chest pain onset 7:30 AM today. She is presently asymptomatic. Patient also reports cough, chronic and unchanged. She patient presently asymptomatic without treatment. Patient noted to have pulse oximetry of 84% on room air, by EMS placed on supplemental oxygen. Administered aspirin by EMS. Past Medical History  Diagnosis Date  . DM (diabetes mellitus) type II controlled peripheral vascular disorder     A1C = 5.8  . HTN (hypertension)   . CHF (congestive heart failure)     EF = 29-92%, grade 2 diastolic dysfunction  . CAD (coronary artery disease)   . COPD (chronic obstructive pulmonary disease)   . Cataracts, bilateral   . Hyperlipidemia   . Peripheral vascular disease bilateral    S/P femoral-popliteal bypass surgery  . Glaucoma   . TIA (transient ischemic attack)   . Shortness of breath   . Pneumonia     HX OF PNA  . Depression   . GERD (gastroesophageal reflux disease)   . Arthritis     RA  . Anemia   . CKD (chronic kidney disease)   . DVT (deep venous thrombosis)   . Cancer     Rt breast   Past Surgical History  Procedure Laterality Date  . Coronary artery bypass graft    . Spine surgery    . Eye surgery  cataract  . Carpal tunnel release  left  . Breast surgery  lumpectomy right  . Aortogram w/ runoff  06/06/11    right leg  . Above knee leg amputation  07/27/11    Right AKA   Family History  Problem Relation Age of Onset  . Hypertension Mother   . Peripheral vascular disease Mother   . Diabetes Father   . Kidney disease Sister   . Cancer  Brother     lung  . Lung disease Sister   . Lung disease Sister   . Kidney disease Daughter     On dialysis, died in 02-15-06   History  Substance Use Topics  . Smoking status: Former Smoker -- 1.00 packs/day for 40 years    Types: Cigarettes    Quit date: 04/13/1977  . Smokeless tobacco: Never Used  . Alcohol Use: No   OB History    No data available     Review of Systems  Respiratory: Positive for cough and shortness of breath.   Cardiovascular: Positive for chest pain.  Musculoskeletal: Positive for myalgias.       Phantom pain from right AKA site, left leg cramps  Allergic/Immunologic: Positive for immunocompromised state.       Diabetic  All other systems reviewed and are negative.     Allergies  Celebrex; Codeine; Iodine; and Omnipaque  Home Medications   Prior to Admission medications   Medication Sig Start Date End Date Taking? Authorizing Provider  allopurinol (ZYLOPRIM) 100 MG tablet Take 1 tablet (100 mg total) by mouth at bedtime. 05/01/14   Lavon Paganini Angiulli, PA-C  amLODipine (NORVASC) 10 MG tablet Take 1 tablet (10 mg total) by mouth daily. 05/01/14   Lavon Paganini Angiulli, PA-C  aspirin EC 81 MG  tablet Take 81 mg by mouth daily.    Historical Provider, MD  B Complex-C-Folic Acid (VOL-CARE RX PO) Take 1 tablet by mouth daily. Nephro-Vite 0.8 mg qd    Historical Provider, MD  capsaicin (ZOSTRIX) 0.025 % cream Apply topically 2 (two) times daily. Patient not taking: Reported on 06/03/2015 05/01/14   Lavon Paganini Angiulli, PA-C  carvedilol (COREG) 3.125 MG tablet Take 1 tablet (3.125 mg total) by mouth 2 (two) times daily with a meal. 05/01/14   Lavon Paganini Angiulli, PA-C  citalopram (CELEXA) 10 MG tablet Take 1 tablet (10 mg total) by mouth daily. 05/01/14   Lavon Paganini Angiulli, PA-C  citalopram (CELEXA) 20 MG tablet  05/27/15   Historical Provider, MD  cloNIDine (CATAPRES) 0.1 MG tablet Take 0.1 mg by mouth daily.    Historical Provider, MD  COLCRYS 0.6 MG tablet  05/27/15    Historical Provider, MD  ezetimibe (ZETIA) 10 MG tablet Take 1 tablet (10 mg total) by mouth daily. 05/01/14   Lavon Paganini Angiulli, PA-C  famotidine (PEPCID) 20 MG tablet Take 1 tablet (20 mg total) by mouth daily. 05/01/14   Lavon Paganini Angiulli, PA-C  furosemide (LASIX) 40 MG tablet Take 1.5 tablets (60 mg total) by mouth 2 (two) times daily. 05/01/14   Lavon Paganini Angiulli, PA-C  hydrALAZINE (APRESOLINE) 100 MG tablet Take 1 tablet (100 mg total) by mouth 3 (three) times daily. 05/01/14   Lavon Paganini Angiulli, PA-C  iron polysaccharides (NIFEREX) 150 MG capsule Take 1 capsule (150 mg total) by mouth 2 (two) times daily. 05/01/14   Lavon Paganini Angiulli, PA-C  isosorbide mononitrate (IMDUR) 120 MG 24 hr tablet Take 1 tablet (120 mg total) by mouth daily. 05/01/14   Lavon Paganini Angiulli, PA-C  ketoconazole (NIZORAL) 2 % cream Apply topically. 06/01/15   Historical Provider, MD  loratadine (CLARITIN) 10 MG tablet Take 1 tablet (10 mg total) by mouth daily. 05/01/14   Lavon Paganini Angiulli, PA-C  montelukast (SINGULAIR) 10 MG tablet Take 1 tablet (10 mg total) by mouth at bedtime. 05/01/14   Lavon Paganini Angiulli, PA-C  timolol (BETIMOL) 0.5 % ophthalmic solution Place 1 drop into both eyes 2 (two) times daily.    Historical Provider, MD  timolol (TIMOPTIC) 0.5 % ophthalmic solution  05/22/15   Historical Provider, MD  traMADol (ULTRAM) 50 MG tablet Take 1 tablet (50 mg total) by mouth every 12 (twelve) hours as needed (1 to 2 tablets for pain not relieved by acetaminophen). Patient not taking: Reported on 06/03/2015 05/01/14   Lavon Paganini Angiulli, PA-C  triamcinolone cream (KENALOG) 0.1 %  06/02/15   Historical Provider, MD   BP 154/49 mmHg  Pulse 65  Temp(Src) 100.4 F (38 C) (Rectal)  Resp 18  SpO2 96% Physical Exam  Constitutional: She appears well-developed and well-nourished.  Chronically ill-appearing  HENT:  Head: Normocephalic and atraumatic.  Mouth/Throat: Oropharyngeal exudate present.  Eyes: Conjunctivae are normal.  Pupils are equal, round, and reactive to light.  Neck: Neck supple. No tracheal deviation present. No thyromegaly present.  Cardiovascular: Normal rate and regular rhythm.   No murmur heard. Pulmonary/Chest: Effort normal. She has rales.  Rales left side posteriorly  Abdominal: Soft. Bowel sounds are normal. She exhibits no distension. There is no tenderness.  Obese  Musculoskeletal: Normal range of motion. She exhibits no edema or tenderness.  Right AKA  Neurological: She is alert. Coordination normal.  Skin: Skin is warm and dry. No rash noted.  Psychiatric: She has a normal  mood and affect.  Nursing note and vitals reviewed.   ED Course  Procedures (including critical care time) Labs Review Labs Reviewed  POC OCCULT BLOOD, ED    Imaging Review No results found.   EKG Interpretation None     Chest x-ray viewed by me. Results for orders placed or performed during the hospital encounter of 06/04/15  Comprehensive metabolic panel  Result Value Ref Range   Sodium 140 135 - 145 mmol/L   Potassium 4.5 3.5 - 5.1 mmol/L   Chloride 106 101 - 111 mmol/L   CO2 21 (L) 22 - 32 mmol/L   Glucose, Bld 146 (H) 65 - 99 mg/dL   BUN 106 (H) 6 - 20 mg/dL   Creatinine, Ser 2.78 (H) 0.44 - 1.00 mg/dL   Calcium 8.8 (L) 8.9 - 10.3 mg/dL   Total Protein 7.1 6.5 - 8.1 g/dL   Albumin 3.6 3.5 - 5.0 g/dL   AST 24 15 - 41 U/L   ALT 8 (L) 14 - 54 U/L   Alkaline Phosphatase 80 38 - 126 U/L   Total Bilirubin 0.4 0.3 - 1.2 mg/dL   GFR calc non Af Amer 14 (L) >60 mL/min   GFR calc Af Amer 17 (L) >60 mL/min   Anion gap 13 5 - 15  CBC WITH DIFFERENTIAL  Result Value Ref Range   WBC 11.7 (H) 4.0 - 10.5 K/uL   RBC 4.43 3.87 - 5.11 MIL/uL   Hemoglobin 10.2 (L) 12.0 - 15.0 g/dL   HCT 31.5 (L) 36.0 - 46.0 %   MCV 71.1 (L) 78.0 - 100.0 fL   MCH 23.0 (L) 26.0 - 34.0 pg   MCHC 32.4 30.0 - 36.0 g/dL   RDW 17.5 (H) 11.5 - 15.5 %   Platelets 205 150 - 400 K/uL   Neutrophils Relative % 85 (H) 43 - 77 %    Neutro Abs 9.9 (H) 1.7 - 7.7 K/uL   Lymphocytes Relative 10 (L) 12 - 46 %   Lymphs Abs 1.2 0.7 - 4.0 K/uL   Monocytes Relative 4 3 - 12 %   Monocytes Absolute 0.5 0.1 - 1.0 K/uL   Eosinophils Relative 1 0 - 5 %   Eosinophils Absolute 0.2 0.0 - 0.7 K/uL   Basophils Relative 0 0 - 1 %   Basophils Absolute 0.0 0.0 - 0.1 K/uL  Brain natriuretic peptide  Result Value Ref Range   B Natriuretic Peptide 308.6 (H) 0.0 - 100.0 pg/mL  POC occult blood, ED RN will collect  Result Value Ref Range   Fecal Occult Bld NEGATIVE NEGATIVE  I-Stat CG4 Lactic Acid, ED  (not at  [facility])  Result Value Ref Range   Lactic Acid, Venous 1.36 0.5 - 2.0 mmol/L   Dg Chest 2 View  06/04/2015   CLINICAL DATA:  Shortness of breath starting last night  EXAM: CHEST  2 VIEW  COMPARISON:  04/28/2014  FINDINGS: Cardiomediastinal silhouette is stable. Status post CABG. Degenerative changes bilateral shoulders. Surgical clips are noted in right axilla. There is central vascular congestion and mild interstitial prominence bilaterally highly suspicious for pulmonary edema. Mild basilar atelectasis without segmental infiltrates. Stable degenerative changes thoracic spine.  IMPRESSION: Central vascular congestion mild interstitial prominence bilaterally highly suspicious for pulmonary edema. No segmental infiltrates. Status post CABG. Degenerative changes bilateral shoulders.   Electronically Signed   By: Lahoma Crocker M.D.   On: 06/04/2015 10:20    MDM  Suspect pneumonia based on lung findings, cough, fever. In  light of immunocompromise state, acute kidney injury, assisted-living [facility] for healthcare associated pneumonia Doubt pulmonary edema patient has oxygen requirement, pulse oximetry dropped to 89% on room air. Spoke with hospitalist service who will arrange for inpatient stay Final diagnoses:  None   diagnosis #1 of care associated pneumonia #2 acute kidney injury 3 hyperglycemia #4 hypoxia      Orlie Dakin,  MD 06/04/15 1255

## 2015-06-04 NOTE — Progress Notes (Signed)
ANTIBIOTIC CONSULT NOTE - INITIAL  Pharmacy Consult for Vancomycin, Ceftazidime  Indication: HCAP   Allergies  Allergen Reactions  . Celebrex [Celecoxib] Rash  . Codeine Other (See Comments)    REACTION: unknown  . Iodine Rash  . Omnipaque [Iohexol] Other (See Comments)    REACTION: unknown    Patient Measurements:   Actual Body Weight: 81.6kg   Vital Signs: Temp: 100.4 F (38 C) (07/22 0906) Temp Source: Rectal (07/22 0906) BP: 154/49 mmHg (07/22 0915) Pulse Rate: 65 (07/22 0915) Intake/Output from previous day:   Intake/Output from this shift:    Labs: No results for input(s): WBC, HGB, PLT, LABCREA, CREATININE in the last 72 hours. CrCl cannot be calculated (Patient has no serum creatinine result on file.). No results for input(s): VANCOTROUGH, VANCOPEAK, VANCORANDOM, GENTTROUGH, GENTPEAK, GENTRANDOM, TOBRATROUGH, TOBRAPEAK, TOBRARND, AMIKACINPEAK, AMIKACINTROU, AMIKACIN in the last 72 hours.   Microbiology: No results found for this or any previous visit (from the past 720 hour(s)).  Medical History: Past Medical History  Diagnosis Date  . DM (diabetes mellitus) type II controlled peripheral vascular disorder     A1C = 5.8  . HTN (hypertension)   . CHF (congestive heart failure)     EF = 09-38%, grade 2 diastolic dysfunction  . CAD (coronary artery disease)   . COPD (chronic obstructive pulmonary disease)   . Cataracts, bilateral   . Hyperlipidemia   . Peripheral vascular disease bilateral    S/P femoral-popliteal bypass surgery  . Glaucoma   . TIA (transient ischemic attack)   . Shortness of breath   . Pneumonia     HX OF PNA  . Depression   . GERD (gastroesophageal reflux disease)   . Arthritis     RA  . Anemia   . CKD (chronic kidney disease)   . DVT (deep venous thrombosis)   . Cancer     Rt breast    Medications:   (Not in a hospital admission) Assessment: 39 YOF with CC of chest pain. She also reports a cough that is chronic and  unchanged. In the ED, she was found to have an elevated WBC of 11.7 and Tm of 100.4 F. Pharmacy consulted to start Ceftazidime and Vancomycin for empiric HCAP coverage. Patient has a history of CKD but is not on HD per medical records. SCr elevated at 2.78 (BL ~ 1.9) and CrCl ~ 14 mL/min   7/22 BCx2>> 7/22 UCx>>  Goal of Therapy:  Vancomycin trough level 15-20 mcg/ml  Plan:  -Start Vancomycin 1 gm IV Q 48 hours and ceftazidime 2 gm IV load followed by 1 gm IV Q 24 hours  -Monitor CBC, renal fx, cultures and clinical progress -[facility] at [place], PharmD., BCPS Clinical Pharmacist Pager [phone]

## 2015-06-04 NOTE — H&P (Signed)
[facility] History and Physical                                                                                    [name], is a 81 y.o. female  MRN: [id number]   DOB - 02/01/1927  Admit Date - 06/04/2015  Outpatient Primary MD for the patient is Simona Huh, MD  Referring MD: Winfred Leeds / ER  With History of -  Past Medical History  Diagnosis Date  . DM (diabetes mellitus) type II controlled peripheral vascular disorder     A1C = 5.8  . HTN (hypertension)   . CHF (congestive heart failure)     EF = 56-43%, grade 2 diastolic dysfunction  . CAD (coronary artery disease)   . COPD (chronic obstructive pulmonary disease)   . Cataracts, bilateral   . Hyperlipidemia   . Peripheral vascular disease bilateral    S/P femoral-popliteal bypass surgery  . Glaucoma   . TIA (transient ischemic attack)   . Shortness of breath   . Pneumonia     HX OF PNA  . Depression   . GERD (gastroesophageal reflux disease)   . Arthritis     RA  . Anemia   . CKD (chronic kidney disease)   . DVT (deep venous thrombosis)   . Cancer     Rt breast      Past Surgical History  Procedure Laterality Date  . Coronary artery bypass graft    . Spine surgery    . Eye surgery  cataract  . Carpal tunnel release  left  . Breast surgery  lumpectomy right  . Aortogram w/ runoff  06/06/11    right leg  . Above knee leg amputation  07/27/11    Right AKA    in for   Chief Complaint  Patient presents with  . Chest Pain     HPI This is a 38 showed female patient with known peripheral vascular disease status post right AKA for nonhealing lower extremity ulcer in 2015. Because of profound deconditioning and issues with mobility patient resides at assisted living facility. Other past medical history includes diet-controlled diabetes, COPD, hypertension, grade 1 diastolic heart failure, chronic kidney disease stage IV followed by Dr. Justin Mend and breast cancer. She presented to the ER today  with reports of chest pain that began around 7:30 today. By the time she had arrived to the ER the chest pain had resolved and patient was primarily complaining of a cough which is chronic. It was reported that when they arrived to the facility patient was maintaining O2 saturations of 84% on room air. Because of report of chest pain MS also give the patient aspirin. After arrival to this facility patient did have productive sputum that was blood streaked in appearance.  Patient had a rectal temperature of 100.4 in the ER, BP was 12 5/53, pulse was 61, O2 saturations were 96% on 4 L oxygen-she has been subsequently weaned down to 2 L oxygen. Chest x-ray was read as central vascular congestion with mild interstitial prominence bilaterally highly suspicious for pulmonary edema. On review of chest films there was a lacy type pattern that  appears more consistent with viral pneumonitis. After arrival patient also began complaining of issues related to dysuria. Toward data revealed leukocytosis with a white count of 11,700 and a left shift with neutrophils 85%. Lactic acid was normal at 1.36, BNP was 308. Patient appeared to be in acute renal failure with a BUN of 106 noting one year ago BUN was 75, and creatinine 2.78 with creatinine only slightly higher than baseline 1 year ago. GFR 17, glucose 146, CO2 slightly decreased to 21. Patient later clarified her chest pain as being left lateral neck pain extending to the chest that was worsened when she turned her head.   Review of Systems   In addition to the HPI above,  No Fever-chills, myalgias or other constitutional symptoms No Headache, changes with Vision or hearing, new weakness, tingling, numbness in any extremity, No problems swallowing food or Liquids, indigestion/reflux No palpitations, orthopnea or DOE No Abdominal pain, N/V; no melena or hematochezia, no dark tarry stools, Bowel movements are regular, No hematuria or flank pain No new skin rashes,  lesions, masses or bruises, No new joints pains-aches No recent weight gain or loss No polyuria, polydypsia or polyphagia,  *A full 10 point Review of Systems was done, except as stated above, all other Review of Systems were negative.  Social History History  Substance Use Topics  . Smoking status: Former Smoker -- 1.00 packs/day for 40 years    Types: Cigarettes    Quit date: 04/13/1977  . Smokeless tobacco: Never Used  . Alcohol Use: No    Resides at: [facility] living  Lives with: N/A  Ambulatory status: Wheelchair bound   Family History Family History  Problem Relation Age of Onset  . Hypertension Mother   . Peripheral vascular disease Mother   . Diabetes Father   . Kidney disease Sister   . Cancer Brother     lung  . Lung disease Sister   . Lung disease Sister   . Kidney disease Daughter     On dialysis, died in 03-07-06     Prior to Admission medications   Medication Sig Start Date End Date Taking? Authorizing Provider  acetaminophen (TYLENOL) 500 MG tablet Take 500 mg by mouth every 6 (six) hours as needed for mild pain.   Yes Historical Provider, MD  allopurinol (ZYLOPRIM) 100 MG tablet Take 1 tablet (100 mg total) by mouth at bedtime. 05/01/14  Yes Daniel J Angiulli, PA-C  amLODipine (NORVASC) 10 MG tablet Take 1 tablet (10 mg total) by mouth daily. 05/01/14  Yes Daniel J Angiulli, PA-C  aspirin EC 81 MG tablet Take 81 mg by mouth daily.   Yes Historical Provider, MD  B Complex-C-Folic Acid (VOL-CARE RX PO) Take 1 tablet by mouth daily. Nephro-Vite 0.8 mg qd   Yes Historical Provider, MD  carvedilol (COREG) 3.125 MG tablet Take 1 tablet (3.125 mg total) by mouth 2 (two) times daily with a meal. 05/01/14  Yes Daniel J Angiulli, PA-C  citalopram (CELEXA) 10 MG tablet Take 1 tablet (10 mg total) by mouth daily. Patient taking differently: Take 20 mg by mouth daily.  05/01/14  Yes Daniel J Angiulli, PA-C  cloNIDine (CATAPRES) 0.1 MG tablet Take  0.1 mg by mouth once as needed (if sbp >180 and dbp >110).    Yes Historical Provider, MD  clotrimazole (LOTRIMIN) 1 % external solution Apply 1 application topically 2 (two) times daily as needed (fungal rash on foot).   Yes Historical Provider, MD  COLCRYS  0.6 MG tablet Take 0.6 mg by mouth daily as needed (acute gout flare).  05/27/15  Yes Historical Provider, MD  ezetimibe (ZETIA) 10 MG tablet Take 1 tablet (10 mg total) by mouth daily. 05/01/14  Yes Daniel J Angiulli, PA-C  famotidine (PEPCID) 20 MG tablet Take 1 tablet (20 mg total) by mouth daily. 05/01/14  Yes Daniel J Angiulli, PA-C  furosemide (LASIX) 40 MG tablet Take 1.5 tablets (60 mg total) by mouth 2 (two) times daily. 05/01/14  Yes Daniel J Angiulli, PA-C  Glucosamine 500 MG CAPS Take 1,500 mg by mouth 3 (three) times daily.   Yes Historical Provider, MD  hydrALAZINE (APRESOLINE) 100 MG tablet Take 1 tablet (100 mg total) by mouth 3 (three) times daily. 05/01/14  Yes Daniel J Angiulli, PA-C  iron polysaccharides (NIFEREX) 150 MG capsule Take 1 capsule (150 mg total) by mouth 2 (two) times daily. 05/01/14  Yes Daniel J Angiulli, PA-C  isosorbide mononitrate (IMDUR) 120 MG 24 hr tablet Take 1 tablet (120 mg total) by mouth daily. 05/01/14  Yes Daniel J Angiulli, PA-C  ketoconazole (NIZORAL) 2 % cream Apply 1 application topically daily as needed for irritation.  06/01/15  Yes Historical Provider, MD  loratadine (CLARITIN) 10 MG tablet Take 1 tablet (10 mg total) by mouth daily. 05/01/14  Yes Daniel J Angiulli, PA-C  montelukast (SINGULAIR) 10 MG tablet Take 1 tablet (10 mg total) by mouth at bedtime. 05/01/14  Yes Daniel J Angiulli, PA-C  polyethylene glycol (MIRALAX / GLYCOLAX) packet Take 17 g by mouth daily as needed for mild constipation.   Yes Historical Provider, MD  PROCRIT 44818 UNIT/ML injection Inject 20,000 Units into the skin every 14 (fourteen) days. 04/22/15  Yes Historical Provider, MD  sennosides-docusate sodium (SENOKOT-S) 8.6-50 MG  tablet Take 1 tablet by mouth daily as needed for constipation.   Yes Historical Provider, MD  timolol (BETIMOL) 0.5 % ophthalmic solution Place 1 drop into both eyes 2 (two) times daily.   Yes Historical Provider, MD  traMADol (ULTRAM) 50 MG tablet Take 1 tablet (50 mg total) by mouth every 12 (twelve) hours as needed (1 to 2 tablets for pain not relieved by acetaminophen). Patient taking differently: Take 50 mg by mouth every 6 (six) hours as needed.  05/01/14  Yes Daniel J Angiulli, PA-C  triamcinolone cream (KENALOG) 0.1 % Apply 1 application topically 2 (two) times daily as needed (eczema).  06/02/15  Yes Historical Provider, MD  capsaicin (ZOSTRIX) 0.025 % cream Apply topically 2 (two) times daily. Patient not taking: Reported on 06/03/2015 05/01/14   Lavon Paganini Angiulli, PA-C    Allergies  Allergen Reactions  . Celebrex [Celecoxib] Rash  . Codeine Other (See Comments)    REACTION: unknown  . Iodine Rash  . Omnipaque [Iohexol] Other (See Comments)    REACTION: unknown    Physical Exam  Vitals  Blood pressure 142/55, pulse 69, temperature 100.4 F (38 C), temperature source Rectal, resp. rate 18, weight 184 lb (83.462 kg), SpO2 97 %.   General:  In no acute distress, appears younger than stated age  Psych:  Normal slightly flat affect, Denies Suicidal or Homicidal ideations, Awake Alert, Oriented X name and place. Speech and thought patterns are clear and appropriate  Neuro:   No focal neurological deficits, CN II through XII intact, Strength 5/5 all 4 extremities, Sensation intact all 4 extremities.  ENT:  Ears and Eyes appear Normal, Conjunctivae clear, PER. Moist oral mucosa without erythema or exudates.  Neck:  Supple, No lymphadenopathy appreciated  Respiratory: Basilar crackles more prominent on the left and extending up to left mid field, history effort nonlabored, 2 L  Cardiac:  RRR, No Murmurs, no LE edema noted, no JVD, No carotid bruits, peripheral pulses palpable at  2+  Abdomen:  Positive bowel sounds, Soft, Non tender, Non distended,  No masses appreciated, no obvious hepatosplenomegaly  Skin:  No Cyanosis, Normal Skin Turgor, No Skin Rash or Bruise.  Extremities: Status post right AKA,  no effusions.  Data Review  CBC  Recent Labs Lab 06/04/15 0911  WBC 11.7*  HGB 10.2*  HCT 31.5*  PLT 205  MCV 71.1*  MCH 23.0*  MCHC 32.4  RDW 17.5*  LYMPHSABS 1.2  MONOABS 0.5  EOSABS 0.2  BASOSABS 0.0    Chemistries   Recent Labs Lab 06/04/15 0911  NA 140  K 4.5  CL 106  CO2 21*  GLUCOSE 146*  BUN 106*  CREATININE 2.78*  CALCIUM 8.8*  AST 24  ALT 8*  ALKPHOS 80  BILITOT 0.4    estimated creatinine clearance is 14.2 mL/min (by C-G formula based on Cr of 2.78).  No results for input(s): TSH, T4TOTAL, T3FREE, THYROIDAB in the last 72 hours.  Invalid input(s): FREET3  Coagulation profile No results for input(s): INR, PROTIME in the last 168 hours.  No results for input(s): DDIMER in the last 72 hours.  Cardiac Enzymes No results for input(s): CKMB, TROPONINI, MYOGLOBIN in the last 168 hours.  Invalid input(s): CK  Invalid input(s): POCBNP  Urinalysis    Component Value Date/Time   COLORURINE YELLOW 03/11/2014 1503   APPEARANCEUR CLOUDY* 03/11/2014 1503   LABSPEC 1.014 03/11/2014 1503   PHURINE 5.0 03/11/2014 1503   GLUCOSEU NEGATIVE 03/11/2014 1503   HGBUR NEGATIVE 03/11/2014 1503   BILIRUBINUR NEGATIVE 03/11/2014 1503   KETONESUR NEGATIVE 03/11/2014 1503   PROTEINUR NEGATIVE 03/11/2014 1503   UROBILINOGEN 0.2 03/11/2014 1503   NITRITE NEGATIVE 03/11/2014 1503   LEUKOCYTESUR NEGATIVE 03/11/2014 1503    Imaging results:   Dg Chest 2 View  06/04/2015   CLINICAL DATA:  Shortness of breath starting last night  EXAM: CHEST  2 VIEW  COMPARISON:  04/28/2014  FINDINGS: Cardiomediastinal silhouette is stable. Status post CABG. Degenerative changes bilateral shoulders. Surgical clips are noted in right axilla. There is  central vascular congestion and mild interstitial prominence bilaterally highly suspicious for pulmonary edema. Mild basilar atelectasis without segmental infiltrates. Stable degenerative changes thoracic spine.  IMPRESSION: Central vascular congestion mild interstitial prominence bilaterally highly suspicious for pulmonary edema. No segmental infiltrates. Status post CABG. Degenerative changes bilateral shoulders.   Electronically Signed   By: Lahoma Crocker M.D.   On: 06/04/2015 10:20     EKG: (Independently reviewed) sinus rhythm, ventricular rate 60, QTC 480 ms, right bundle branch block with prolonged pR interval   Assessment & Plan  Principal Problem:   Acute respiratory failure with hypoxia/ fever /community-acquired pneumonia -Admit to telemetry renal floor -Continue supportive care with oxygen/ TCDB/ IS -Patient with prolonged QTC cannot utilize Zithromax or Levaquin so we'll continue broad spectrum antibiotics initiated in ER; suspect can narrow antibiotics within 48 hours -Continue oxygen and wean as tolerated -Follow up on blood cultures, urinary strep and Legionella  Active Problems:   Acute renal failure on CKD, stage IV -BUN 75 one year ago and now greater than 100 which appears to be consistent with volume depletion -Slight decrease in serum CO2 and likely more reflective of mild acute renal  failure   Chronic diastolic heart failure, NYHA class 1  -Appears compensated at this juncture -Given concerns that is actually dehydrated we are holding Lasix -Monitor for volume overload with utilization IV fluids    HTN - Moderate control -Continue preadmission Norvasc, carvedilol, Catapres, Apresoline and Imdur    COPD/Asthma -Compensated and without wheezing  -Continue Singulair    Dysuria -Follow up on urinalysis and culture    S/P AKA / PVD -Ensure patient out of bed to chair at least every shift    Diet-controlled diabetes mellitus -Low CBGs and provide SSI if  needed    Combined iron deficiency and anemia of chronic kidney disease -Continue oral iron -Procrit every 14 days     DVT Prophylaxis: Subcutaneous heparin  Family Communication:   Granddaughter at bedside  Code Status:  DO NOT RESUSCITATE  Condition:  Stable  Discharge disposition: Anticipate discharge back to assisted living facility once respiratory status improved and patient weaned from oxygen  Time spent in minutes : 60      Etta Gassett L. ANP on 06/04/2015 at 1:32 PM  Between 7am to 7pm - Pager - [phone]  After 7pm go to [email or web address] - password [email or web address]  And look for the night coverage person covering me after hours  [facility]

## 2015-06-04 NOTE — ED Notes (Signed)
Dark tarry stool noted to tip of rectal temperature cover.  Notified Dr Winfred Leeds.

## 2015-06-04 NOTE — Progress Notes (Signed)
Utilization review completed. Ota Ebersole, RN, BSN. 

## 2015-06-04 NOTE — ED Notes (Signed)
Per EMS - pt coming from [facility] senior living. Pt complaining of chest pain, weakness, and shortness of breath . Pt went to vascular yesterday for R AKA for follow up. Pt complaining of left chest pain underneath breast, reports it feeling as "someone mashing" on her. Lungs clear. sats 84% on RA on arrival of EMS, 4L @ 97%. Pt received 324 mg aspirin. Pt was pain free on EMS arrival. A/O x4.

## 2015-06-04 NOTE — Progress Notes (Signed)
06/04/2015 2:48 PM  Report received. Room is ready for the patient.   [employer], RN-BC, [facility] Phone [phone]

## 2015-06-05 LAB — BASIC METABOLIC PANEL
ANION GAP: 10 (ref 5–15)
BUN: 105 mg/dL — ABNORMAL HIGH (ref 6–20)
CALCIUM: 8 mg/dL — AB (ref 8.9–10.3)
CO2: 23 mmol/L (ref 22–32)
Chloride: 104 mmol/L (ref 101–111)
Creatinine, Ser: 2.73 mg/dL — ABNORMAL HIGH (ref 0.44–1.00)
GFR calc Af Amer: 17 mL/min — ABNORMAL LOW (ref >60)
GFR calc non Af Amer: 15 mL/min — ABNORMAL LOW (ref >60)
GLUCOSE: 102 mg/dL — AB (ref 65–99)
Potassium: 4.2 mmol/L (ref 3.5–5.1)
SODIUM: 137 mmol/L (ref 135–145)

## 2015-06-05 LAB — URINE CULTURE: Culture: NO GROWTH

## 2015-06-05 LAB — CBC
HCT: 25.8 % — ABNORMAL LOW (ref 36.0–46.0)
Hemoglobin: 8.2 g/dL — ABNORMAL LOW (ref 12.0–15.0)
MCH: 22.8 pg — AB (ref 26.0–34.0)
MCHC: 31.8 g/dL (ref 30.0–36.0)
MCV: 71.9 fL — ABNORMAL LOW (ref 78.0–100.0)
Platelets: 148 10*3/uL — ABNORMAL LOW (ref 150–400)
RBC: 3.59 MIL/uL — AB (ref 3.87–5.11)
RDW: 17.6 % — ABNORMAL HIGH (ref 11.5–15.5)
WBC: 9.3 10*3/uL (ref 4.0–10.5)

## 2015-06-05 LAB — EXPECTORATED SPUTUM ASSESSMENT W GRAM STAIN, RFLX TO RESP C

## 2015-06-05 LAB — GLUCOSE, CAPILLARY
GLUCOSE-CAPILLARY: 100 mg/dL — AB (ref 65–99)
Glucose-Capillary: 104 mg/dL — ABNORMAL HIGH (ref 65–99)
Glucose-Capillary: 157 mg/dL — ABNORMAL HIGH (ref 65–99)
Glucose-Capillary: 129 mg/dL — ABNORMAL HIGH (ref 65–99)

## 2015-06-05 LAB — EXPECTORATED SPUTUM ASSESSMENT W REFEX TO RESP CULTURE

## 2015-06-05 LAB — INFLUENZA PANEL BY PCR (TYPE A & B)
H1N1FLUPCR: NOT DETECTED
INFLBPCR: NEGATIVE
Influenza A By PCR: NEGATIVE

## 2015-06-05 LAB — HEMOGLOBIN A1C
HEMOGLOBIN A1C: 6.8 % — AB (ref 4.8–5.6)
MEAN PLASMA GLUCOSE: 148 mg/dL

## 2015-06-05 LAB — HIV ANTIBODY (ROUTINE TESTING W REFLEX): HIV SCREEN 4TH GENERATION: NONREACTIVE

## 2015-06-05 MED ORDER — FUROSEMIDE 10 MG/ML IJ SOLN
20.0000 mg | Freq: Two times a day (BID) | INTRAMUSCULAR | Status: DC
  Administered 2015-06-05 – 2015-06-08 (×6): 20 mg via INTRAVENOUS
  Filled 2015-06-05 (×7): qty 2

## 2015-06-05 MED ORDER — CETYLPYRIDINIUM CHLORIDE 0.05 % MT LIQD
7.0000 mL | Freq: Two times a day (BID) | OROMUCOSAL | Status: DC
  Administered 2015-06-05 – 2015-06-11 (×12): 7 mL via OROMUCOSAL

## 2015-06-05 NOTE — Progress Notes (Signed)
CRITICAL VALUE ALERT  Critical value received:  Blood culture, aerobic bottle = positive for gram positive cocci in clusters  Date of notification:  06-05-2015  Time of notification:  19:15  Critical value read back:Yes.    Nurse who received alert:  Genelle Bal, RN  MD notified (1st page):  L. [place] (night coverage)  Time of first page:  19:44  MD notified (2nd page):  Time of second page:  Responding MD:    Time MD responded:

## 2015-06-05 NOTE — Evaluation (Signed)
Clinical/Bedside Swallow Evaluation Patient Details  Name: SACHA TOPOR MRN: [id number] Date of Birth: [birth year]  Today's Date: 06/05/2015 Time: SLP Start Time (ACUTE ONLY): 1022 SLP Stop Time (ACUTE ONLY): 1043 SLP Time Calculation (min) (ACUTE ONLY): 21 min  Past Medical History:  Past Medical History  Diagnosis Date  . DM (diabetes mellitus) type II controlled peripheral vascular disorder     A1C = 5.8  . HTN (hypertension)   . CHF (congestive heart failure)     EF = 47-42%, grade 2 diastolic dysfunction  . CAD (coronary artery disease)   . COPD (chronic obstructive pulmonary disease)   . Cataracts, bilateral   . Hyperlipidemia   . Peripheral vascular disease bilateral    S/P femoral-popliteal bypass surgery  . Glaucoma   . TIA (transient ischemic attack)   . Shortness of breath   . Pneumonia     HX OF PNA  . Depression   . GERD (gastroesophageal reflux disease)   . Arthritis     RA  . Anemia   . CKD (chronic kidney disease)   . DVT (deep venous thrombosis)   . Cancer     Rt breast  . Hypoxia 06/04/2015   Past Surgical History:  Past Surgical History  Procedure Laterality Date  . Coronary artery bypass graft    . Spine surgery    . Eye surgery  cataract  . Carpal tunnel release  left  . Breast surgery  lumpectomy right  . Aortogram w/ runoff  06/06/11    right leg  . Above knee leg amputation  07/27/11    Right AKA   HPI:  Central vascular congestion mild interstitial prominence bilaterally   Assessment / Plan / Recommendation Clinical Impression  Pts swallow appearing within functional limits. Swallow initiation very timely, vocal quality remained clear, and pt displayed no overt signs or symtpoms of aspiration with regular, puree, thin, and mixed consistency POs. Continue regular diet with thin liquids at this time. No further ST intervention indicated at this time.     Aspiration Risk       Diet Recommendation Thin;Age appropriate regular solids    Medication Administration: Whole meds with liquid    Other  Recommendations Oral Care Recommendations: Oral care BID   Follow Up Recommendations       Frequency and Duration        Pertinent Vitals/Pain     SLP Swallow Goals     Swallow Study Prior Functional Status       General Date of Onset: 06/04/15 Other Pertinent Information: Central vascular congestion mild interstitial prominence bilaterally Type of Study: Bedside swallow evaluation Diet Prior to this Study: Regular;Thin liquids Temperature Spikes Noted: Yes Respiratory Status: Supplemental O2 delivered via (comment) History of Recent Intubation: No Behavior/Cognition: Alert;Cooperative;Pleasant mood Oral Cavity - Dentition:  (upper and lower dentures) Self-Feeding Abilities: Able to feed self Patient Positioning: Upright in bed Baseline Vocal Quality: Normal Volitional Cough: Strong Volitional Swallow: Able to elicit    Oral/Motor/Sensory Function Overall Oral Motor/Sensory Function: Appears within functional limits for tasks assessed Labial ROM: Within Functional Limits Labial Symmetry: Within Functional Limits Labial Strength: Within Functional Limits Lingual ROM: Within Functional Limits Lingual Symmetry: Within Functional Limits Lingual Strength: Within Functional Limits   Ice Chips Ice chips: Within functional limits   Thin Liquid Thin Liquid: Within functional limits    Nectar Thick Nectar Thick Liquid: Not tested   Honey Thick Honey Thick Liquid: Not tested   Puree Puree: Within functional  limits   Solid   GO   Arvil Chaco MA, CCC-SLP Acute Care Speech Language Pathologist     Solid: Within functional limits       Arvil Chaco E 06/05/2015,10:47 AM

## 2015-06-05 NOTE — Progress Notes (Signed)
Patient ID: [name], female   DOB: 23-May-1927, 81 y.o.   MRN: [id number]  [facility] PROGRESS NOTE  [name] HGD:[id number] DOB: 08/29/1927 DOA: 06/04/2015 PCP: Simona Huh, MD   Brief narrative:    81 yo female with known peripheral vascular disease status post right AKA for nonhealing lower extremity ulcer in 2015, resident of ALF, with COPD, HTN, grade I diastolic CHF, CKD stage IV (follows with Dr. Justin Mend), presented to [facility] ED with chest pain and shortness of breath, associated with intermittent episodes of productive and non productive cough. Per ALF staff report pt with oxygen saturation in 80's on RA.  Patient had a rectal temperature of 100.4 in the ER, BP was 125/53, pulse was 61, O2 saturations were 96% on 4 L oxygen-she has been subsequently weaned down to 2 L oxygen.   Assessment/Plan:    Principal Problem:   Acute respiratory failure with hypoxia - likely secondary to acute diastolic CHF rather than PNA - pt is afebrile and with WBC now WNL with stable HR and BP - lactic acid and procalcitonin level WNL as well and given recent C. Diff, will avoid ABX as possible - stop broad spectrum ABX and monitor clinical status, if pt remains afebrile and continues doing better, would not restart ABX - continue Lasix 20 mg IV BID and readjust as clinically indicated   Active Problems:   Acute on chronic diastolic CHF - grade I per last 2 D ECHO in April 2016 - continue Lasix as noted above - weight today is 84 kg, monitor daily weights, strict I/O    Fever - Tmax 99.9 F - possibly related to UTI as pt with dysuria and urinary urgency - urine culture negative so hold off on ABX for now     S/P AKA (above knee amputation) - stable     Diet-controlled diabetes mellitus with complications of CKD and PVD - continue SSI    CKD (chronic kidney disease), stage IV - close monitoring while on Lasix IV    HTN (hypertension) - continue home medical regimen      PVD (peripheral vascular disease) - s/P right AKA     Morbid obesity with BMI > 35 and underlying risk factors of HTN, CKD - Body mass index is 36.17 kg/(m^2).   DVT prophylaxis - Heparin SQ  Code Status: DNR Family Communication:  plan of care discussed with the patient Disposition Plan: Home when stable.   IV access:  Peripheral IV  Procedures and diagnostic studies:    Dg Chest 2 View 06/04/2015  Central vascular congestion mild interstitial prominence bilaterally highly suspicious for pulmonary edema. No segmental infiltrates. Status post CABG. Degenerative changes bilateral shoulders.     Medical Consultants:  None  Other Consultants:  None  IAnti-Infectives:   Vanc and fortaz stopped 7/23   Faye Ramsay, MD  [facility] Pager [phone]  If 7PM-7AM, please contact night-coverage [email or web address] Password [email or web address] 06/05/2015, 3:52 PM   LOS: 1 day   HPI/Subjective: No events overnight.   Objective: Filed Vitals:   06/04/15 2102 06/05/15 0500 06/05/15 0821 06/05/15 1312  BP: 122/42 108/39 131/40 118/40  Pulse: 52 61 66 52  Temp: 98.6 F (37 C) 98.9 F (37.2 C) 99 F (37.2 C)   TempSrc: Oral Oral Oral   Resp: 19 17 18    Height:      Weight: 84 kg (185 lb 3 oz)     SpO2: 100% 100% 100%     Intake/Output Summary (  Last 24 hours) at 06/05/15 1552 Last data filed at 06/05/15 1400  Gross per 24 hour  Intake    660 ml  Output      0 ml  Net    660 ml    Exam:   General:  Pt is alert, follows commands appropriately, not in acute distress  Cardiovascular: Regular rate and rhythm, S1/S2, no murmurs, no rubs, no gallops  Respiratory: Clear to auscultation bilaterally, no wheezing, no crackles, no rhonchi  Abdomen: Soft, non tender, non distended, bowel sounds present, no guarding  Extremities: No edema, pulses DP and PT palpable bilaterally  Neuro: Grossly nonfocal  Data Reviewed: Basic Metabolic Panel:  Recent Labs Lab 06/04/15 0911 06/05/15 0249   NA 140 137  K 4.5 4.2  CL 106 104  CO2 21* 23  GLUCOSE 146* 102*  BUN 106* 105*  CREATININE 2.78* 2.73*  CALCIUM 8.8* 8.0*   Liver Function Tests:  Recent Labs Lab 06/04/15 0911  AST 24  ALT 8*  ALKPHOS 80  BILITOT 0.4  PROT 7.1  ALBUMIN 3.6   CBC:  Recent Labs Lab 06/04/15 0911 06/05/15 0249  WBC 11.7* 9.3  NEUTROABS 9.9*  --   HGB 10.2* 8.2*  HCT 31.5* 25.8*  MCV 71.1* 71.9*  PLT 205 148*   CBG:  Recent Labs Lab 06/04/15 1645 06/04/15 2104 06/05/15 0821 06/05/15 1138  GLUCAP 120* 133* 104* 157*    Recent Results (from the past 240 hour(s))  Blood Culture (routine x 2)     Status: None (Preliminary result)   Collection Time: 06/04/15 10:50 AM  Result Value Ref Range Status   Specimen Description BLOOD RIGHT HAND  Final   Special Requests BOTTLES DRAWN AEROBIC AND ANAEROBIC 5CC  Final   Culture NO GROWTH 1 DAY  Final   Report Status PENDING  Incomplete  Blood Culture (routine x 2)     Status: None (Preliminary result)   Collection Time: 06/04/15 11:00 AM  Result Value Ref Range Status   Specimen Description BLOOD LEFT HAND  Final   Special Requests BOTTLES DRAWN AEROBIC AND ANAEROBIC 5CC  Final   Culture NO GROWTH 1 DAY  Final   Report Status PENDING  Incomplete  Urine culture     Status: None   Collection Time: 06/04/15  1:26 PM  Result Value Ref Range Status   Specimen Description URINE, CATHETERIZED  Final   Special Requests NONE  Final   Culture NO GROWTH 1 DAY  Final   Report Status 06/05/2015 FINAL  Final     Scheduled Meds: . allopurinol  100 mg Oral QHS  . amLODipine  10 mg Oral Daily  . antiseptic oral rinse  7 mL Mouth Rinse BID  . aspirin EC  81 mg Oral Daily  . carvedilol  3.125 mg Oral BID WC  . cefTAZidime (FORTAZ)  IV  1 g Intravenous Q24H  . citalopram  20 mg Oral Daily  . Darbepoetin Alfa  100 mcg Subcutaneous Q14 Days  . ezetimibe  10 mg Oral Daily  . famotidine  20 mg Oral Daily  . heparin  5,000 Units Subcutaneous 3  times per day  . hydrALAZINE  100 mg Oral 3 times per day  . insulin aspart  0-5 Units Subcutaneous QHS  . insulin aspart  0-9 Units Subcutaneous TID WC  . iron polysaccharides  150 mg Oral BID  . isosorbide mononitrate  120 mg Oral Daily  . loratadine  10 mg Oral Daily  .  montelukast  10 mg Oral QHS  . timolol  1 drop Both Eyes BID  . [START ON 06/06/2015] vancomycin  1,000 mg Intravenous Q48H   Continuous Infusions:

## 2015-06-05 NOTE — Clinical Social Work Note (Signed)
Clinical Social Work Assessment  Patient Details  Name: MONIK LINS MRN: [id number] Date of Birth: 01-05-1927  Date of referral:  06/05/15               Reason for consult:  Facility Placement                Permission sought to share information with:  Family Supports Permission granted to share information::  Yes, Verbal Permission Granted  Name::     Engineer, civil (consulting)::     Relationship::  Dtr  Contact Information:     Housing/Transportation Living arrangements for the past 2 months:  [place] of Information:  Patient Patient Interpreter Needed:  None Criminal Activity/Legal Involvement Pertinent to Current Situation/Hospitalization:  No - Comment as needed Significant Relationships:  Adult Children Lives with:  Facility Resident Do you feel safe going back to the place where you live?  Yes Need for family participation in patient care:  No (Coment)  Care giving concerns:  Patient plans to return to ALF.   Social Worker assessment / plan:  CSW received referral due to patient being admitted from ALF. Patient has lived at [facility] since September 2015. Patient reports she enjoys living at ALF and plans to return once medically stable. CSW explained CSW role and DC process. Patient reports family is aware of admission and CSW does not need to contact anyone at this time.  CSW completed FL2 and called ALF. No response at ALF so weekday CSW will have to follow up with ALF.  Employment status:  Retired Forensic scientist:  Medicare PT Recommendations:  Not assessed at this time [place] / Referral to community resources:  Other (Comment Required) (To return to ALF)  Patient/Family's Response to care:  Patient engaged during assessment but reports she is not feeling well today.  Patient/Family's Understanding of and Emotional Response to Diagnosis, Current Treatment, and Prognosis:  Patient reports she has chronic medical problems but is  hopeful that once she feels better she can DC from the hospital.  Emotional Assessment Appearance:  Appears stated age Attitude/Demeanor/Rapport:  Other (Cooperative) Affect (typically observed):  Appropriate Orientation:  Oriented to Self, Oriented to Place, Oriented to  Time, Oriented to Situation Alcohol / Substance use:  Not Applicable Psych involvement (Current and /or in the community):  No (Comment)  Discharge Needs  Concerns to be addressed:  No discharge needs identified Readmission within the last 30 days:  No Current discharge risk:  None Barriers to Discharge:  No Barriers Identified   Boone Master, Henry 06/05/2015, 12:39 PM Weekend Coverage

## 2015-06-06 LAB — GLUCOSE, CAPILLARY
GLUCOSE-CAPILLARY: 111 mg/dL — AB (ref 65–99)
GLUCOSE-CAPILLARY: 129 mg/dL — AB (ref 65–99)
Glucose-Capillary: 153 mg/dL — ABNORMAL HIGH (ref 65–99)
Glucose-Capillary: 91 mg/dL (ref 65–99)

## 2015-06-06 LAB — CBC
HCT: 25.5 % — ABNORMAL LOW (ref 36.0–46.0)
Hemoglobin: 8.3 g/dL — ABNORMAL LOW (ref 12.0–15.0)
MCH: 23.2 pg — ABNORMAL LOW (ref 26.0–34.0)
MCHC: 32.5 g/dL (ref 30.0–36.0)
MCV: 71.2 fL — ABNORMAL LOW (ref 78.0–100.0)
PLATELETS: 147 10*3/uL — AB (ref 150–400)
RBC: 3.58 MIL/uL — ABNORMAL LOW (ref 3.87–5.11)
RDW: 17.4 % — AB (ref 11.5–15.5)
WBC: 7 10*3/uL (ref 4.0–10.5)

## 2015-06-06 LAB — BASIC METABOLIC PANEL
ANION GAP: 11 (ref 5–15)
BUN: 113 mg/dL — ABNORMAL HIGH (ref 6–20)
CALCIUM: 8.2 mg/dL — AB (ref 8.9–10.3)
CO2: 22 mmol/L (ref 22–32)
CREATININE: 2.94 mg/dL — AB (ref 0.44–1.00)
Chloride: 101 mmol/L (ref 101–111)
GFR calc Af Amer: 16 mL/min — ABNORMAL LOW (ref >60)
GFR calc non Af Amer: 13 mL/min — ABNORMAL LOW (ref >60)
Glucose, Bld: 87 mg/dL (ref 65–99)
Potassium: 4.4 mmol/L (ref 3.5–5.1)
Sodium: 134 mmol/L — ABNORMAL LOW (ref 135–145)

## 2015-06-06 LAB — PROCALCITONIN: PROCALCITONIN: 0.16 ng/mL

## 2015-06-06 MED ORDER — VANCOMYCIN HCL IN DEXTROSE 1-5 GM/200ML-% IV SOLN
1000.0000 mg | INTRAVENOUS | Status: DC
  Administered 2015-06-06: 1000 mg via INTRAVENOUS
  Filled 2015-06-06: qty 200

## 2015-06-06 MED ORDER — FUROSEMIDE 10 MG/ML IJ SOLN
INTRAMUSCULAR | Status: DC
Start: 2015-06-06 — End: 2015-06-06
  Filled 2015-06-06: qty 4

## 2015-06-06 NOTE — Progress Notes (Signed)
Spoke with midlevel concerning Pt's diastolic BP being under 60. Mid level states to give hydralazine tonight and see what BP is at 4 am. Will continue to monitor.

## 2015-06-06 NOTE — Progress Notes (Signed)
Patient ID: [name], female   DOB: Dec 21, 1926, 81 y.o.   MRN: [id number]  [facility] PROGRESS NOTE  JAMIE HAFFORD JIR:[id number] DOB: [birth year] DOA: 06/04/2015 PCP: Simona Huh, MD   Brief narrative:    81 yo female with known peripheral vascular disease status post right AKA for nonhealing lower extremity ulcer in 2015, resident of ALF, with COPD, HTN, grade I diastolic CHF, CKD stage IV (follows with Dr. Justin Mend), presented to [facility] ED with chest pain and shortness of breath, associated with intermittent episodes of productive and non productive cough. Per ALF staff report pt with oxygen saturation in 80's on RA.  Patient had a rectal temperature of 100.4 in the ER, BP was 125/53, pulse was 61, O2 saturations were 96% on 4 L oxygen-she has been subsequently weaned down to 2 L oxygen.   Assessment/Plan:    Principal Problem:   Acute respiratory failure with hypoxia - likely secondary to acute diastolic CHF rather than PNA - pt is afebrile and with WBC now WNL with stable HR and BP - lactic acid and procalcitonin level WNL as well and given recent C. Diff, will avoid ABX as possible - continue Lasix 20 mg IV BID and readjust as clinically indicated  - we have restarted vancomycin as preliminary blood culture positive for g+ cocci in clusters, if contaminant, will d/c vancomycin   Active Problems:   Acute on chronic diastolic CHF - grade I per last 2 D ECHO in April 2016 - continue Lasix as noted above - weight today is 84 kg, monitor daily weights, strict I/O    Fever - Tmax 99.9 F 7/23 - possibly related to UTI as pt with dysuria and urinary urgency - urine culture negative but prelim blood culture with g+ cocci, so will resume vancomycin for now     S/P AKA (above knee amputation), right  - stable     Diet-controlled diabetes mellitus with complications of CKD and PVD - continue SSI    CKD (chronic kidney disease), stage IV - close monitoring while on Lasix  IV - BMP in AM    Acute thrombocytopenia - close monitoring  - CBC in AM    HTN (hypertension) - continue home medical regimen     Morbid obesity with BMI > 35 and underlying risk factors of HTN, CKD - Body mass index is 36.5 kg/(m^2).  DVT prophylaxis - Heparin SQ  Code Status: DNR Family Communication:  plan of care discussed with the patient Disposition Plan: Home when stable.   IV access:  Peripheral IV  Procedures and diagnostic studies:    Dg Chest 2 View 06/04/2015  Central vascular congestion mild interstitial prominence bilaterally highly suspicious for pulmonary edema. No segmental infiltrates. Status post CABG. Degenerative changes bilateral shoulders.     Medical Consultants:  None  Other Consultants:  None  IAnti-Infectives:   Vanc and fortaz stopped 7/23 but vanc resumed 7/24 -->  Faye Ramsay, MD  [facility] Pager [phone]  If 7PM-7AM, please contact night-coverage [email or web address] Password [facility] 06/06/2015, 7:54 AM   LOS: 2 days   HPI/Subjective: No events overnight.   Objective: Filed Vitals:   06/05/15 0821 06/05/15 1312 06/05/15 2107 06/06/15 0508  BP: 131/40 118/40 117/23 114/71  Pulse: 66 52 54 70  Temp: 99 F (37.2 C)  98.2 F (36.8 C) 97.9 F (36.6 C)  TempSrc: Oral  Oral Oral  Resp: 18  16   Height:      Weight:   84.763  kg (186 lb 13.9 oz)   SpO2: 100%  100% 100%    Intake/Output Summary (Last 24 hours) at 06/06/15 0754 Last data filed at 06/06/15 0500  Gross per 24 hour  Intake    480 ml  Output      0 ml  Net    480 ml    Exam:   General:  Pt is alert, follows commands appropriately, not in acute distress  Cardiovascular: Regular rate and rhythm, S1/S2, no murmurs, no rubs, no gallops  Respiratory: Clear to auscultation bilaterally, no wheezing, no crackles, no rhonchi  Abdomen: Soft, non tender, non distended, bowel sounds present, no guarding  Extremities: right AKA  Neuro: Grossly nonfocal  Data  Reviewed: Basic Metabolic Panel:  Recent Labs Lab 06/04/15 0911 06/05/15 0249 06/06/15 0541  NA 140 137 134*  K 4.5 4.2 4.4  CL 106 104 101  CO2 21* 23 22  GLUCOSE 146* 102* 87  BUN 106* 105* 113*  CREATININE 2.78* 2.73* 2.94*  CALCIUM 8.8* 8.0* 8.2*   Liver Function Tests:  Recent Labs Lab 06/04/15 0911  AST 24  ALT 8*  ALKPHOS 80  BILITOT 0.4  PROT 7.1  ALBUMIN 3.6   CBC:  Recent Labs Lab 06/04/15 0911 06/05/15 0249 06/06/15 0541  WBC 11.7* 9.3 7.0  NEUTROABS 9.9*  --   --   HGB 10.2* 8.2* 8.3*  HCT 31.5* 25.8* 25.5*  MCV 71.1* 71.9* 71.2*  PLT 205 148* 147*   CBG:  Recent Labs Lab 06/04/15 2104 06/05/15 0821 06/05/15 1138 06/05/15 1720 06/05/15 2110  GLUCAP 133* 104* 157* 100* 129*    Recent Results (from the past 240 hour(s))  Blood Culture (routine x 2)     Status: None (Preliminary result)   Collection Time: 06/04/15 10:50 AM  Result Value Ref Range Status   Specimen Description BLOOD RIGHT HAND  Final   Special Requests BOTTLES DRAWN AEROBIC AND ANAEROBIC 5CC  Final   Culture NO GROWTH 1 DAY  Final   Report Status PENDING  Incomplete  Blood Culture (routine x 2)     Status: None (Preliminary result)   Collection Time: 06/04/15 11:00 AM  Result Value Ref Range Status   Specimen Description BLOOD LEFT HAND  Final   Special Requests BOTTLES DRAWN AEROBIC AND ANAEROBIC 5CC  Final   Culture  Setup Time   Final    GRAM POSITIVE COCCI IN CLUSTERS AEROBIC BOTTLE ONLY CRITICAL RESULT CALLED TO, READ BACK BY AND VERIFIED WITH: BRAKE,A RN 06/05/15 1915 [place] CONFIRMED BY B MARTIN    Culture NO GROWTH 1 DAY  Final   Report Status PENDING  Incomplete  Urine culture     Status: None   Collection Time: 06/04/15  1:26 PM  Result Value Ref Range Status   Specimen Description URINE, CATHETERIZED  Final   Special Requests NONE  Final   Culture NO GROWTH 1 DAY  Final   Report Status 06/05/2015 FINAL  Final  Culture, sputum-assessment     Status:  None   Collection Time: 06/05/15  6:44 PM  Result Value Ref Range Status   Specimen Description SPUTUM  Final   Special Requests NONE  Final   Sputum evaluation   Final    THIS SPECIMEN IS ACCEPTABLE. RESPIRATORY CULTURE REPORT TO FOLLOW.   Report Status 06/05/2015 FINAL  Final     Scheduled Meds: . allopurinol  100 mg Oral QHS  . amLODipine  10 mg Oral Daily  . antiseptic oral  rinse  7 mL Mouth Rinse BID  . aspirin EC  81 mg Oral Daily  . carvedilol  3.125 mg Oral BID WC  . citalopram  20 mg Oral Daily  . Darbepoetin Alfa  100 mcg Subcutaneous Q14 Days  . ezetimibe  10 mg Oral Daily  . famotidine  20 mg Oral Daily  . furosemide  20 mg Intravenous BID  . heparin  5,000 Units Subcutaneous 3 times per day  . hydrALAZINE  100 mg Oral 3 times per day  . insulin aspart  0-5 Units Subcutaneous QHS  . insulin aspart  0-9 Units Subcutaneous TID WC  . iron polysaccharides  150 mg Oral BID  . isosorbide mononitrate  120 mg Oral Daily  . loratadine  10 mg Oral Daily  . montelukast  10 mg Oral QHS  . timolol  1 drop Both Eyes BID   Continuous Infusions:

## 2015-06-06 NOTE — Progress Notes (Signed)
ANTIBIOTIC CONSULT NOTE - Follow Up  Pharmacy Consult for Vancomycin Indication: 1/2 GPC blood cx   Allergies  Allergen Reactions  . Celebrex [Celecoxib] Rash  . Codeine Other (See Comments)    REACTION: unknown  . Iodine Rash  . Omnipaque [Iohexol] Other (See Comments)    REACTION: unknown    Patient Measurements: Height: 5' (152.4 cm) Weight: 186 lb 13.9 oz (84.763 kg) IBW/kg (Calculated) : 45.5 Actual Body Weight: 81.6kg   Vital Signs: Temp: 97.9 F (36.6 C) (07/24 0508) Temp Source: Oral (07/24 0508) BP: 114/71 mmHg (07/24 0508) Pulse Rate: 70 (07/24 0508) Intake/Output from previous day: 07/23 0701 - 07/24 0700 In: 480 [P.O.:480] Out: -  Intake/Output from this shift:    Labs:  Recent Labs  06/04/15 0911 06/05/15 0249 06/06/15 0541  WBC 11.7* 9.3 7.0  HGB 10.2* 8.2* 8.3*  PLT 205 148* 147*  CREATININE 2.78* 2.73* 2.94*   Estimated Creatinine Clearance: 13.3 mL/min (by C-G formula based on Cr of 2.94). No results for input(s): VANCOTROUGH, VANCOPEAK, VANCORANDOM, GENTTROUGH, GENTPEAK, GENTRANDOM, TOBRATROUGH, TOBRAPEAK, TOBRARND, AMIKACINPEAK, AMIKACINTROU, AMIKACIN in the last 72 hours.   Microbiology: Recent Results (from the past 720 hour(s))  Blood Culture (routine x 2)     Status: None (Preliminary result)   Collection Time: 06/04/15 10:50 AM  Result Value Ref Range Status   Specimen Description BLOOD RIGHT HAND  Final   Special Requests BOTTLES DRAWN AEROBIC AND ANAEROBIC 5CC  Final   Culture NO GROWTH 1 DAY  Final   Report Status PENDING  Incomplete  Blood Culture (routine x 2)     Status: None (Preliminary result)   Collection Time: 06/04/15 11:00 AM  Result Value Ref Range Status   Specimen Description BLOOD LEFT HAND  Final   Special Requests BOTTLES DRAWN AEROBIC AND ANAEROBIC 5CC  Final   Culture  Setup Time   Final    GRAM POSITIVE COCCI IN CLUSTERS AEROBIC BOTTLE ONLY CRITICAL RESULT CALLED TO, READ BACK BY AND VERIFIED WITH: BRAKE,A  RN 06/05/15 1915 [place] CONFIRMED BY B MARTIN    Culture NO GROWTH 1 DAY  Final   Report Status PENDING  Incomplete  Urine culture     Status: None   Collection Time: 06/04/15  1:26 PM  Result Value Ref Range Status   Specimen Description URINE, CATHETERIZED  Final   Special Requests NONE  Final   Culture NO GROWTH 1 DAY  Final   Report Status 06/05/2015 FINAL  Final  Culture, sputum-assessment     Status: None   Collection Time: 06/05/15  6:44 PM  Result Value Ref Range Status   Specimen Description SPUTUM  Final   Special Requests NONE  Final   Sputum evaluation   Final    THIS SPECIMEN IS ACCEPTABLE. RESPIRATORY CULTURE REPORT TO FOLLOW.   Report Status 06/05/2015 FINAL  Final    Medical History: Past Medical History  Diagnosis Date  . DM (diabetes mellitus) type II controlled peripheral vascular disorder     A1C = 5.8  . HTN (hypertension)   . CHF (congestive heart failure)     EF = 14-97%, grade 2 diastolic dysfunction  . CAD (coronary artery disease)   . COPD (chronic obstructive pulmonary disease)   . Cataracts, bilateral   . Hyperlipidemia   . Peripheral vascular disease bilateral    S/P femoral-popliteal bypass surgery  . Glaucoma   . TIA (transient ischemic attack)   . Shortness of breath   . Pneumonia  HX OF PNA  . Depression   . GERD (gastroesophageal reflux disease)   . Arthritis     RA  . Anemia   . CKD (chronic kidney disease)   . DVT (deep venous thrombosis)   . Cancer     Rt breast  . Hypoxia 06/04/2015    Medications:  Prescriptions prior to admission  Medication Sig Dispense Refill Last Dose  . acetaminophen (TYLENOL) 500 MG tablet Take 500 mg by mouth every 6 (six) hours as needed for mild pain.   Past Month at Unknown time  . allopurinol (ZYLOPRIM) 100 MG tablet Take 1 tablet (100 mg total) by mouth at bedtime. 30 tablet 1 06/03/2015 at Unknown time  . amLODipine (NORVASC) 10 MG tablet Take 1 tablet (10 mg total) by mouth daily. 30  tablet 1 06/03/2015 at Unknown time  . aspirin EC 81 MG tablet Take 81 mg by mouth daily.   06/03/2015 at Unknown time  . B Complex-C-Folic Acid (VOL-CARE RX PO) Take 1 tablet by mouth daily. Nephro-Vite 0.8 mg qd   06/03/2015 at Unknown time  . carvedilol (COREG) 3.125 MG tablet Take 1 tablet (3.125 mg total) by mouth 2 (two) times daily with a meal. 60 tablet 1 06/03/2015 at 1600  . citalopram (CELEXA) 10 MG tablet Take 1 tablet (10 mg total) by mouth daily. (Patient taking differently: Take 20 mg by mouth daily. ) 30 tablet 1 06/03/2015 at Unknown time  . cloNIDine (CATAPRES) 0.1 MG tablet Take 0.1 mg by mouth once as needed (if sbp >180 and dbp >110).    Past Month at Unknown time  . clotrimazole (LOTRIMIN) 1 % external solution Apply 1 application topically 2 (two) times daily as needed (fungal rash on foot).   Past Month at Unknown time  . COLCRYS 0.6 MG tablet Take 0.6 mg by mouth daily as needed (acute gout flare).    unk at unk  . ezetimibe (ZETIA) 10 MG tablet Take 1 tablet (10 mg total) by mouth daily. 30 tablet 1 06/03/2015 at Unknown time  . famotidine (PEPCID) 20 MG tablet Take 1 tablet (20 mg total) by mouth daily. 30 tablet 1 06/03/2015 at Unknown time  . furosemide (LASIX) 40 MG tablet Take 1.5 tablets (60 mg total) by mouth 2 (two) times daily. 90 tablet 1 06/03/2015 at Unknown time  . Glucosamine 500 MG CAPS Take 1,500 mg by mouth 3 (three) times daily.   06/03/2015 at Unknown time  . hydrALAZINE (APRESOLINE) 100 MG tablet Take 1 tablet (100 mg total) by mouth 3 (three) times daily. 90 tablet 1 06/03/2015 at Unknown time  . iron polysaccharides (NIFEREX) 150 MG capsule Take 1 capsule (150 mg total) by mouth 2 (two) times daily. 60 capsule 1 06/03/2015 at Unknown time  . isosorbide mononitrate (IMDUR) 120 MG 24 hr tablet Take 1 tablet (120 mg total) by mouth daily. 30 tablet 1 06/03/2015 at Unknown time  . ketoconazole (NIZORAL) 2 % cream Apply 1 application topically daily as needed for  irritation.    unk at unk  . loratadine (CLARITIN) 10 MG tablet Take 1 tablet (10 mg total) by mouth daily. 30 tablet 1 06/03/2015 at Unknown time  . montelukast (SINGULAIR) 10 MG tablet Take 1 tablet (10 mg total) by mouth at bedtime. 30 tablet 1 06/03/2015 at Unknown time  . polyethylene glycol (MIRALAX / GLYCOLAX) packet Take 17 g by mouth daily as needed for mild constipation.   unk at [employer]  . PROCRIT 77824  UNIT/ML injection Inject 20,000 Units into the skin every 14 (fourteen) days.   Past Month at Unknown time  . sennosides-docusate sodium (SENOKOT-S) 8.6-50 MG tablet Take 1 tablet by mouth daily as needed for constipation.   unk at unk  . timolol (BETIMOL) 0.5 % ophthalmic solution Place 1 drop into both eyes 2 (two) times daily.   06/03/2015 at Unknown time  . traMADol (ULTRAM) 50 MG tablet Take 1 tablet (50 mg total) by mouth every 12 (twelve) hours as needed (1 to 2 tablets for pain not relieved by acetaminophen). (Patient taking differently: Take 50 mg by mouth every 6 (six) hours as needed. ) 60 tablet 0 Past Month at Unknown time  . triamcinolone cream (KENALOG) 0.1 % Apply 1 application topically 2 (two) times daily as needed (eczema).    Past Month at Unknown time  . capsaicin (ZOSTRIX) 0.025 % cream Apply topically 2 (two) times daily. (Patient not taking: Reported on 06/03/2015) 60 g 0 Not Taking   Assessment: 81 YO F admitted 7/22 with CC of chest pain. She also reports a cough that is chronic and unchanged. In the ED, she was found to have an elevated WBC of 11.7 and Tm of 100.4 F. Pharmacy consulted to start Ceftazidime and Vancomycin for empiric HCAP coverage. Patient has a history of CKD but is not on HD per medical records. SCr elevated at 2.78 (BL ~ 1.9) and CrCl ~ 14 mL/min.  Admitting MD discontinued broad spectrum abx with normal LA and PCT and recent C.Diff.  Suspicion for infection at that time was low.   7/22 BCx2>> 1/2 GPC 7/22 UCx>> neg  This AM, 1/2 blood cx resulted with  GPC.  MD has reordered Vancomycin pending further cx results.  Goal of Therapy:  Vancomycin trough level 15-20 mcg/ml  Plan:  Resume Vancomycin 1 gm IV Q 48 hours - next dose due 7/24 at noon Monitor CBC, renal fx, cultures and clinical progress [facility] at [facility], Pharm.D., BCPS Clinical Pharmacist Pager [phone] 06/06/2015 8:24 AM

## 2015-06-07 DIAGNOSIS — I5032 Chronic diastolic (congestive) heart failure: Secondary | ICD-10-CM

## 2015-06-07 DIAGNOSIS — J9601 Acute respiratory failure with hypoxia: Secondary | ICD-10-CM

## 2015-06-07 DIAGNOSIS — I10 Essential (primary) hypertension: Secondary | ICD-10-CM

## 2015-06-07 LAB — BASIC METABOLIC PANEL
ANION GAP: 10 (ref 5–15)
BUN: 112 mg/dL — AB (ref 6–20)
CALCIUM: 8.4 mg/dL — AB (ref 8.9–10.3)
CO2: 22 mmol/L (ref 22–32)
CREATININE: 2.91 mg/dL — AB (ref 0.44–1.00)
Chloride: 100 mmol/L — ABNORMAL LOW (ref 101–111)
GFR calc non Af Amer: 14 mL/min — ABNORMAL LOW (ref >60)
GFR, EST AFRICAN AMERICAN: 16 mL/min — AB (ref >60)
GLUCOSE: 92 mg/dL (ref 65–99)
Potassium: 4.3 mmol/L (ref 3.5–5.1)
SODIUM: 132 mmol/L — AB (ref 135–145)

## 2015-06-07 LAB — LEGIONELLA ANTIGEN, URINE

## 2015-06-07 LAB — GLUCOSE, CAPILLARY
Glucose-Capillary: 103 mg/dL — ABNORMAL HIGH (ref 65–99)
Glucose-Capillary: 184 mg/dL — ABNORMAL HIGH (ref 65–99)
Glucose-Capillary: 134 mg/dL — ABNORMAL HIGH (ref 65–99)
Glucose-Capillary: 137 mg/dL — ABNORMAL HIGH (ref 65–99)

## 2015-06-07 LAB — CBC
HEMATOCRIT: 26.4 % — AB (ref 36.0–46.0)
Hemoglobin: 8.6 g/dL — ABNORMAL LOW (ref 12.0–15.0)
MCH: 23.1 pg — ABNORMAL LOW (ref 26.0–34.0)
MCHC: 32.6 g/dL (ref 30.0–36.0)
MCV: 70.8 fL — ABNORMAL LOW (ref 78.0–100.0)
Platelets: 166 10*3/uL (ref 150–400)
RBC: 3.73 MIL/uL — AB (ref 3.87–5.11)
RDW: 17.6 % — AB (ref 11.5–15.5)
WBC: 7 10*3/uL (ref 4.0–10.5)

## 2015-06-07 NOTE — Progress Notes (Signed)
Patient ID: [name], female   DOB: 06-09-1927, 81 y.o.   MRN: [id number]  [facility] PROGRESS NOTE  [name] YPP:[id number] DOB: 12-23-1926 DOA: 06/04/2015 PCP: Simona Huh, MD   Brief narrative:    81 yo female with known peripheral vascular disease status post right AKA for nonhealing lower extremity ulcer in 2015, resident of ALF, with COPD, HTN, grade I diastolic CHF, CKD stage IV (follows with Dr. Justin Mend), presented to [facility] ED with chest pain and shortness of breath, associated with intermittent episodes of productive and non productive cough. Per ALF staff report pt with oxygen saturation in 80's on RA.  Patient had a rectal temperature of 100.4 in the ER, BP was 125/53, pulse was 61, O2 saturations were 96% on 4 L oxygen-she has been subsequently weaned down to 2 L oxygen.   Assessment/Plan:    Principal Problem:   Acute respiratory failure with hypoxia - likely secondary to acute diastolic CHF rather than PNA - pt is afebrile and with WBC now WNL with stable HR and BP - lactic acid and procalcitonin level WNL as well and given recent C. Diff, will avoid ABX as possible - continue Lasix 20 mg IV BID and readjust as clinically indicated  - consult cardio for further assistance as Cr is trending up   Active Problems:   Acute on chronic diastolic CHF - grade I per last 2 D ECHO in April 2016 - continue Lasix as noted above - weight today is 83 kg (was 84 yesterday, monitor daily weights, strict I/O    Bradycardia - stop coreg - consulted cardiology     Fever - Tmax 99.9 F 7/23, resolved  - blood cultures with coag neg staph, stop vanc     S/P AKA (above knee amputation), right  - stable     Diet-controlled diabetes mellitus with complications of CKD and PVD - continue SSI    CKD (chronic kidney disease), stage IV - close monitoring while on Lasix IV - BMP in AM    Acute thrombocytopenia - close monitoring  - CBC in AM    HTN (hypertension) -  continue home medical regimen but hold coreg due to bradycardia     Morbid obesity with BMI > 35 and underlying risk factors of HTN, CKD - Body mass index is 35.79 kg/(m^2).  DVT prophylaxis - Heparin SQ  Code Status: DNR Family Communication:  plan of care discussed with the patient Disposition Plan: Home when stable.   IV access:  Peripheral IV  Procedures and diagnostic studies:    Dg Chest 2 View 06/04/2015  Central vascular congestion mild interstitial prominence bilaterally highly suspicious for pulmonary edema. No segmental infiltrates. Status post CABG. Degenerative changes bilateral shoulders.     Medical Consultants:  None  Other Consultants:  None  IAnti-Infectives:   Vanc and fortaz stopped 7/23 but vanc resumed 7/24 --> 7/25  Faye Ramsay, MD  [facility] Pager [phone]  If 7PM-7AM, please contact night-coverage [email or web address] Password [facility] 06/07/2015, 4:22 PM   LOS: 3 days   HPI/Subjective: No events overnight.   Objective: Filed Vitals:   06/06/15 2153 06/07/15 0543 06/07/15 0700 06/07/15 1558  BP: 130/42 118/38 140/51 141/49  Pulse:  48 62 42  Temp:  98 F (36.7 C) 98.3 F (36.8 C) 98.3 F (36.8 C)  TempSrc:  Oral Oral Oral  Resp:  16 17 16   Height:      Weight:      SpO2:  92% 95%  97%    Intake/Output Summary (Last 24 hours) at 06/07/15 1622 Last data filed at 06/07/15 1439  Gross per 24 hour  Intake    340 ml  Output    250 ml  Net     90 ml    Exam:   General:  Pt is alert, follows commands appropriately, not in acute distress  Cardiovascular: Regular rhythm, bradycardia, S1/S2, no murmurs, no rubs, no gallops  Respiratory: Clear to auscultation bilaterally, no wheezing, no crackles, no rhonchi  Abdomen: Soft, non tender, non distended, bowel sounds present, no guarding  Extremities: right AKA  Neuro: Grossly nonfocal  Data Reviewed: Basic Metabolic Panel:  Recent Labs Lab 06/04/15 0911 06/05/15 0249 06/06/15 0541  06/07/15 0614  NA 140 137 134* 132*  K 4.5 4.2 4.4 4.3  CL 106 104 101 100*  CO2 21* 23 22 22   GLUCOSE 146* 102* 87 92  BUN 106* 105* 113* 112*  CREATININE 2.78* 2.73* 2.94* 2.91*  CALCIUM 8.8* 8.0* 8.2* 8.4*   Liver Function Tests:  Recent Labs Lab 06/04/15 0911  AST 24  ALT 8*  ALKPHOS 80  BILITOT 0.4  PROT 7.1  ALBUMIN 3.6   CBC:  Recent Labs Lab 06/04/15 0911 06/05/15 0249 06/06/15 0541 06/07/15 0614  WBC 11.7* 9.3 7.0 7.0  NEUTROABS 9.9*  --   --   --   HGB 10.2* 8.2* 8.3* 8.6*  HCT 31.5* 25.8* 25.5* 26.4*  MCV 71.1* 71.9* 71.2* 70.8*  PLT 205 148* 147* 166   CBG:  Recent Labs Lab 06/06/15 1214 06/06/15 1652 06/06/15 2143 06/07/15 0744 06/07/15 1154  GLUCAP 129* 153* 91 103* 184*    Recent Results (from the past 240 hour(s))  Blood Culture (routine x 2)     Status: None (Preliminary result)   Collection Time: 06/04/15 10:50 AM  Result Value Ref Range Status   Specimen Description BLOOD RIGHT HAND  Final   Special Requests BOTTLES DRAWN AEROBIC AND ANAEROBIC 5CC  Final   Culture NO GROWTH 3 DAYS  Final   Report Status PENDING  Incomplete  Blood Culture (routine x 2)     Status: None (Preliminary result)   Collection Time: 06/04/15 11:00 AM  Result Value Ref Range Status   Specimen Description BLOOD LEFT HAND  Final   Special Requests BOTTLES DRAWN AEROBIC AND ANAEROBIC 5CC  Final   Culture  Setup Time   Final    GRAM POSITIVE COCCI IN CLUSTERS AEROBIC BOTTLE ONLY CRITICAL RESULT CALLED TO, READ BACK BY AND VERIFIED WITH: BRAKE,A RN 06/05/15 1915 [place] BY B MARTIN    Culture   Final    STAPHYLOCOCCUS SPECIES (COAGULASE NEGATIVE) THE SIGNIFICANCE OF ISOLATING THIS ORGANISM FROM A SINGLE SET OF BLOOD CULTURES WHEN MULTIPLE SETS ARE DRAWN IS UNCERTAIN. PLEASE NOTIFY THE MICROBIOLOGY DEPARTMENT WITHIN ONE WEEK IF SPECIATION AND SENSITIVITIES ARE REQUIRED.    Report Status PENDING  Incomplete  Urine culture     Status: None    Collection Time: 06/04/15  1:26 PM  Result Value Ref Range Status   Specimen Description URINE, CATHETERIZED  Final   Special Requests NONE  Final   Culture NO GROWTH 1 DAY  Final   Report Status 06/05/2015 FINAL  Final  Culture, sputum-assessment     Status: None   Collection Time: 06/05/15  6:44 PM  Result Value Ref Range Status   Specimen Description SPUTUM  Final   Special Requests NONE  Final   Sputum evaluation  Final    THIS SPECIMEN IS ACCEPTABLE. RESPIRATORY CULTURE REPORT TO FOLLOW.   Report Status 06/05/2015 FINAL  Final  Culture, respiratory (NON-Expectorated)     Status: None (Preliminary result)   Collection Time: 06/05/15  6:44 PM  Result Value Ref Range Status   Specimen Description SPUTUM  Final   Special Requests NONE  Final   Gram Stain   Final    FEW WBC ABUNDANT SQUAMOUS EPITHELIAL CELLS PRESENT MODERATE GRAM POSITIVE COCCI IN CHAINS IN PAIRS FEW GRAM NEGATIVE RODS Performed at [facility]    Culture   Final    Culture reincubated for better growth Performed at [facility]    Report Status PENDING  Incomplete     Scheduled Meds: . allopurinol  100 mg Oral QHS  . amLODipine  10 mg Oral Daily  . antiseptic oral rinse  7 mL Mouth Rinse BID  . aspirin EC  81 mg Oral Daily  . citalopram  20 mg Oral Daily  . Darbepoetin Alfa  100 mcg Subcutaneous Q14 Days  . ezetimibe  10 mg Oral Daily  . famotidine  20 mg Oral Daily  . furosemide  20 mg Intravenous BID  . heparin  5,000 Units Subcutaneous 3 times per day  . hydrALAZINE  100 mg Oral 3 times per day  . insulin aspart  0-5 Units Subcutaneous QHS  . insulin aspart  0-9 Units Subcutaneous TID WC  . iron polysaccharides  150 mg Oral BID  . isosorbide mononitrate  120 mg Oral Daily  . loratadine  10 mg Oral Daily  . montelukast  10 mg Oral QHS  . timolol  1 drop Both Eyes BID   Continuous Infusions:

## 2015-06-07 NOTE — Care Management Important Message (Signed)
Important Message  Patient Details  Name: [name] MRN: [id number] Date of Birth: 1927/03/01   Medicare Important Message Given:  Yes-second notification given    Pricilla Handler 06/07/2015, 2:42 PM

## 2015-06-07 NOTE — Clinical Documentation Improvement (Signed)
Please clarify and document if the diagnosis of "Acute Renal Failure" and "Acute Kidney Injury" are applicable to this admission.  "Acute Renal Failure" is documented in the H&P and "Acute Kidney Injury" is documented in the ED provider note.  Known history of Stage 4 CKD followed by Dr. Justin Mend is also documented in the progress notes.  Renal function trend this admission:  Component     Latest Ref Rng 06/04/2015 06/05/2015 06/06/2015 06/07/2015  BUN     6 - 20 mg/dL 106 (H) 105 (H) 113 (H) 112 (H)  Creatinine     0.44 - 1.00 mg/dL 2.78 (H) 2.73 (H) 2.94 (H) 2.91 (H)  EGFR (African American)     >60 mL/min 17 (L) 17 (L) 16 (L) 16 (L)   Thank You, Erling Conte ,RN Clinical Documentation Specialist:  [place] Information Management

## 2015-06-07 NOTE — Consult Note (Signed)
CARDIOLOGY CONSULT NOTE   Patient ID: [name] MRN: [id number] DOB/AGE: [birth year] 81 y.o.  Admit date: 06/04/2015  Primary Physician   Simona Huh, MD Primary Cardiologist   new Reason for Consultation  Bradycardia and CHF  HPI: [name] is a 81 y.o. female with a history of peripheral vascular disease status post right AKA for nonhealing lower extremity ulcer in 1962, chronic diastolic CHF, HTN, HL, TIA, DVT, diet controlled diabetes, COPD and chronic kidney disease stage IV followed by Dr. Justin Mend and breast cancer. She presented to the ER from the assisted living facility with hypoxia, chest tightness and coughing. Per EMS, O2 sats were 84% on room air and patient had productive sputum with blood change. Patient had temp of 100.4 in ED, O2 sats 96% on 4 L. Chest x-ray mild central vascular congestion with pulmonary edema ? Infiltrates. Lab data reviewed WBC is 11.7 with left shift, BNP 308, BUN 106, creatinine 2.78, lactic acid 1.36.   She was admitted with acute respiratory failure with hypoxia, fevers, likely due to community-acquired pneumonia. She was admitted to telemetry, QTC 480 at that time, and started broad-spectrum antibiotics.   She was placed on IV lasix 20mg  BID for diuresis. He never had any negative I/O so far, however 1lb weight loss since admission. Due to recent c.diff abx was avoided. Her hypoxia with acute respiratory failure thought to be due to acute diastolic CHF rather than PNA. She was initially paced on vacomycin as preliminary blood culture positive for g+ cocci in clusters, not discontinued. Current denies chest pain. SOB stable. No palpitation, orthopnea or PND.   Her Coreg was stopped last night due to bradycardia. Cardiology consulted for further assistance as creatinine is trending up and bradycardia. EKG on presented showed sinus rhythm at rate of 63, RBBB, LAFB, 1st degree AV block, no change from prior EKG.  Last echo 02/2013 showed LV EF of  60-65%, grade 1 DD, mild mitral reg, no wm abnormality, dilated inferior vena cava which is constant with elevated central venous pressure.   Past Medical History  Diagnosis Date  . DM (diabetes mellitus) type II controlled peripheral vascular disorder     A1C = 5.8  . HTN (hypertension)   . CHF (congestive heart failure)     EF = 22-97%, grade 2 diastolic dysfunction  . CAD (coronary artery disease)   . COPD (chronic obstructive pulmonary disease)   . Cataracts, bilateral   . Hyperlipidemia   . Peripheral vascular disease bilateral    S/P femoral-popliteal bypass surgery  . Glaucoma   . TIA (transient ischemic attack)   . Shortness of breath   . Pneumonia     HX OF PNA  . Depression   . GERD (gastroesophageal reflux disease)   . Arthritis     RA  . Anemia   . CKD (chronic kidney disease)   . DVT (deep venous thrombosis)   . Cancer     Rt breast  . Hypoxia 06/04/2015     Past Surgical History  Procedure Laterality Date  . Coronary artery bypass graft    . Spine surgery    . Eye surgery  cataract  . Carpal tunnel release  left  . Breast surgery  lumpectomy right  . Aortogram w/ runoff  06/06/11    right leg  . Above knee leg amputation  07/27/11    Right AKA    Allergies  Allergen Reactions  . Celebrex [Celecoxib] Rash  . Codeine  Other (See Comments)    REACTION: unknown  . Iodine Rash  . Omnipaque [Iohexol] Other (See Comments)    REACTION: unknown    I have reviewed the patient's current medications . allopurinol  100 mg Oral QHS  . amLODipine  10 mg Oral Daily  . antiseptic oral rinse  7 mL Mouth Rinse BID  . aspirin EC  81 mg Oral Daily  . citalopram  20 mg Oral Daily  . Darbepoetin Alfa  100 mcg Subcutaneous Q14 Days  . ezetimibe  10 mg Oral Daily  . famotidine  20 mg Oral Daily  . furosemide  20 mg Intravenous BID  . heparin  5,000 Units Subcutaneous 3 times per day  . hydrALAZINE  100 mg Oral 3 times per day  . insulin aspart  0-5 Units  Subcutaneous QHS  . insulin aspart  0-9 Units Subcutaneous TID WC  . iron polysaccharides  150 mg Oral BID  . isosorbide mononitrate  120 mg Oral Daily  . loratadine  10 mg Oral Daily  . montelukast  10 mg Oral QHS  . timolol  1 drop Both Eyes BID     polyethylene glycol, traMADol  Prior to Admission medications   Medication Sig Start Date End Date Taking? Authorizing Provider  acetaminophen (TYLENOL) 500 MG tablet Take 500 mg by mouth every 6 (six) hours as needed for mild pain.   Yes Historical Provider, MD  allopurinol (ZYLOPRIM) 100 MG tablet Take 1 tablet (100 mg total) by mouth at bedtime. 05/01/14  Yes Daniel J Angiulli, PA-C  amLODipine (NORVASC) 10 MG tablet Take 1 tablet (10 mg total) by mouth daily. 05/01/14  Yes Daniel J Angiulli, PA-C  aspirin EC 81 MG tablet Take 81 mg by mouth daily.   Yes Historical Provider, MD  B Complex-C-Folic Acid (VOL-CARE RX PO) Take 1 tablet by mouth daily. Nephro-Vite 0.8 mg qd   Yes Historical Provider, MD  carvedilol (COREG) 3.125 MG tablet Take 1 tablet (3.125 mg total) by mouth 2 (two) times daily with a meal. 05/01/14  Yes Daniel J Angiulli, PA-C  citalopram (CELEXA) 10 MG tablet Take 1 tablet (10 mg total) by mouth daily. Patient taking differently: Take 20 mg by mouth daily.  05/01/14  Yes Daniel J Angiulli, PA-C  cloNIDine (CATAPRES) 0.1 MG tablet Take 0.1 mg by mouth once as needed (if sbp >180 and dbp >110).    Yes Historical Provider, MD  clotrimazole (LOTRIMIN) 1 % external solution Apply 1 application topically 2 (two) times daily as needed (fungal rash on foot).   Yes Historical Provider, MD  COLCRYS 0.6 MG tablet Take 0.6 mg by mouth daily as needed (acute gout flare).  05/27/15  Yes Historical Provider, MD  ezetimibe (ZETIA) 10 MG tablet Take 1 tablet (10 mg total) by mouth daily. 05/01/14  Yes Daniel J Angiulli, PA-C  famotidine (PEPCID) 20 MG tablet Take 1 tablet (20 mg total) by mouth daily. 05/01/14  Yes Daniel J Angiulli, PA-C    furosemide (LASIX) 40 MG tablet Take 1.5 tablets (60 mg total) by mouth 2 (two) times daily. 05/01/14  Yes Daniel J Angiulli, PA-C  Glucosamine 500 MG CAPS Take 1,500 mg by mouth 3 (three) times daily.   Yes Historical Provider, MD  hydrALAZINE (APRESOLINE) 100 MG tablet Take 1 tablet (100 mg total) by mouth 3 (three) times daily. 05/01/14  Yes Daniel J Angiulli, PA-C  iron polysaccharides (NIFEREX) 150 MG capsule Take 1 capsule (150 mg total) by mouth 2 (  two) times daily. 05/01/14  Yes Daniel J Angiulli, PA-C  isosorbide mononitrate (IMDUR) 120 MG 24 hr tablet Take 1 tablet (120 mg total) by mouth daily. 05/01/14  Yes Daniel J Angiulli, PA-C  ketoconazole (NIZORAL) 2 % cream Apply 1 application topically daily as needed for irritation.  06/01/15  Yes Historical Provider, MD  loratadine (CLARITIN) 10 MG tablet Take 1 tablet (10 mg total) by mouth daily. 05/01/14  Yes Daniel J Angiulli, PA-C  montelukast (SINGULAIR) 10 MG tablet Take 1 tablet (10 mg total) by mouth at bedtime. 05/01/14  Yes Daniel J Angiulli, PA-C  polyethylene glycol (MIRALAX / GLYCOLAX) packet Take 17 g by mouth daily as needed for mild constipation.   Yes Historical Provider, MD  PROCRIT 02585 UNIT/ML injection Inject 20,000 Units into the skin every 14 (fourteen) days. 04/22/15  Yes Historical Provider, MD  sennosides-docusate sodium (SENOKOT-S) 8.6-50 MG tablet Take 1 tablet by mouth daily as needed for constipation.   Yes Historical Provider, MD  timolol (BETIMOL) 0.5 % ophthalmic solution Place 1 drop into both eyes 2 (two) times daily.   Yes Historical Provider, MD  traMADol (ULTRAM) 50 MG tablet Take 1 tablet (50 mg total) by mouth every 12 (twelve) hours as needed (1 to 2 tablets for pain not relieved by acetaminophen). Patient taking differently: Take 50 mg by mouth every 6 (six) hours as needed.  05/01/14  Yes Daniel J Angiulli, PA-C  triamcinolone cream (KENALOG) 0.1 % Apply 1 application topically 2 (two) times daily as needed  (eczema).  06/02/15  Yes Historical Provider, MD  capsaicin (ZOSTRIX) 0.025 % cream Apply topically 2 (two) times daily. Patient not taking: Reported on 06/03/2015 05/01/14   Lavon Paganini Angiulli, PA-C     History   Social History  . Marital Status: Widowed    Spouse Name: N/A  . Number of Children: N/A  . Years of Education: N/A   Occupational History  . Not on file.   Social History Main Topics  . Smoking status: Former Smoker -- 1.00 packs/day for 40 years    Types: Cigarettes    Quit date: 04/13/1977  . Smokeless tobacco: Never Used  . Alcohol Use: No  . Drug Use: No  . Sexual Activity: No   Other Topics Concern  . Not on file   Social History Narrative    Family Status  Relation Status Death Age  . Mother Deceased 78  . Father Deceased 76   Family History  Problem Relation Age of Onset  . Hypertension Mother   . Peripheral vascular disease Mother   . Diabetes Father   . Kidney disease Sister   . Cancer Brother     lung  . Lung disease Sister   . Lung disease Sister   . Kidney disease Daughter     On dialysis, died in 02-26-2006     ROS:  Full 14 point review of systems complete and found to be negative unless listed above.  Physical Exam: Blood pressure 141/49, pulse 42, temperature 98.3 F (36.8 C), temperature source Oral, resp. rate 16, height 5' (1.524 m), weight 183 lb 3.9 oz (83.12 kg), SpO2 97 %.  General: Well developed, well nourished, female in no acute distress Head: Eyes PERRLA, No xanthomas. Normocephalic and atraumatic, oropharynx without edema or exudate.  Lungs: Resp regular and unlabored. Diminished breath sound bibasilar. No wheezing or rales.  Heart: regular rhythm with bradycardia no s3, s4, or murmurs..   Neck: No carotid bruits. No lymphadenopathy  or  JVD. Abdomen: Bowel sounds present, abdomen soft and non-tender without masses or hernias noted. Msk:  No spine or cva tenderness. No weakness, no joint deformities or effusions. Extremities:  No clubbing, cyanosis or edema. Right AKA. Diminished pulse on left lower extremities.  Neuro: Alert and oriented X 3. No focal deficits noted. Psych:  Good affect, responds appropriately Skin: No rashes or lesions noted.  Labs:   Lab Results  Component Value Date   WBC 7.0 06/07/2015   HGB 8.6* 06/07/2015   HCT 26.4* 06/07/2015   MCV 70.8* 06/07/2015   PLT 166 06/07/2015   No results for input(s): INR in the last 72 hours.  Recent Labs Lab 06/04/15 0911  06/07/15 0614  NA 140  < > 132*  K 4.5  < > 4.3  CL 106  < > 100*  CO2 21*  < > 22  BUN 106*  < > 112*  CREATININE 2.78*  < > 2.91*  CALCIUM 8.8*  < > 8.4*  PROT 7.1  --   --   BILITOT 0.4  --   --   ALKPHOS 80  --   --   ALT 8*  --   --   AST 24  --   --   GLUCOSE 146*  < > 92  ALBUMIN 3.6  --   --   < > = values in this interval not displayed. MAGNESIUM  Date Value Ref Range Status  06/30/2012 1.6 1.5 - 2.5 mg/dL Final   No results for input(s): CKTOTAL, CKMB, TROPONINI in the last 72 hours. No results for input(s): TROPIPOC in the last 72 hours. PRO B NATRIURETIC PEPTIDE (BNP)  Date/Time Value Ref Range Status  04/15/2014 11:59 PM 3805.0* 0 - 450 pg/mL Final  03/09/2014 08:13 PM 4610.0* 0 - 450 pg/mL Final   No results found for: CHOL, HDL, LDLCALC, TRIG Lab Results  Component Value Date   DDIMER * 04/17/2011    2.80        AT THE INHOUSE ESTABLISHED CUTOFF VALUE OF 0.48 ug/mL FEU, THIS ASSAY HAS BEEN DOCUMENTED IN THE LITERATURE TO HAVE A SENSITIVITY AND NEGATIVE PREDICTIVE VALUE OF AT LEAST 98 TO 99%.  THE TEST RESULT SHOULD BE CORRELATED WITH AN ASSESSMENT OF THE CLINICAL PROBABILITY OF DVT / VTE.   No results found for: LIPASE, AMYLASE TSH  Date/Time Value Ref Range Status  04/16/2014 04:27 AM 4.920* 0.350 - 4.500 uIU/mL Final   T3, FREE  Date/Time Value Ref Range Status  04/16/2014 07:45 AM 2.3 2.3 - 4.2 pg/mL Final    Comment:    Performed at [place] [facility]    Date/Time Value Ref Range Status  04/16/2014 07:45 AM 511 211 - 911 pg/mL Final    Comment:    Performed at [facility]  Date/Time Value Ref Range Status  06/24/2014 11:07 AM 438* 10 - 291 ng/mL Final    Comment:    Performed at [facility]   TIBC  Date/Time Value Ref Range Status  06/24/2014 11:07 AM 199* 250 - 470 ug/dL Final   IRON  Date/Time Value Ref Range Status  06/24/2014 11:07 AM 88 42 - 135 ug/dL Final    Echo: 03/10/14 LV EF: 60% -  65%  ------------------------------------------------------------ Indications:   Heart Failure - cardiomegaly 429.3.  ------------------------------------------------------------ History:  PMH: Pneumonia, Former SMoker, Renal Failure, Breast Cancer Congestive heart failure. Chronic obstructive pulmonary disease. Risk factors: Hypertension. Diabetes  mellitus.  ------------------------------------------------------------ Study Conclusions  - Left ventricle: The cavity size was normal. There was mild concentric hypertrophy. Systolic function was normal. The estimated ejection fraction was in the range of 60% to 65%. Wall motion was normal; there were no regional wall motion abnormalities. Doppler parameters are consistent with abnormal left ventricular relaxation (grade 1 diastolic dysfunction). - Mitral valve: Poorly visualized. Mild regurgitation. - Left atrium: The atrium was at the upper limits of normal in size. - Right atrium: The atrium was mildly dilated. - Atrial septum: No defect or patent foramen ovale was identified. - Inferior vena cava: The vessel was dilated; the respirophasic diameter changes were blunted (< 50%); findings are consistent with elevated central venous pressure. - Pericardium, extracardiac: There was no pericardial effusion.  ECG:   Vent. rate 63 BPM PR interval 238 ms QRS duration 153 ms QT/QTc 460/471 ms P-R-T axes 133 -51  69  Radiology:  No results found.  ASSESSMENT AND PLAN:     1. Asymptomatic bradycardia  - on tele noted rate as low as 38. Currently rate in low 40s. Last dose of coreg this AM, discontinued. She have long standing history of 1st degree AV block with prolonged QT. Will continue to observe on tele as she is asymptomatic and now off BB. No arrhthymias noted on tele.   2. 1st degree AV block - As above  3. Acute on chronic diastolic CHF - Last echo 01/8249 showed LV EF of 60-65%, grade 1 DD, mild mitral reg, no wm abnormality, dilated inferior vena cava which is constant with elevated central venous pressure.  - Her diastolic function improved on last echo (grade 1) when compared to echo in 2013 (grade 2 with ef of 50-55%) - BNP of 308.6 - She is appears euvolemic. She is on IV lasix 20mg  BID for diuresis. He never had any negative I/O so far, however 1lb weight loss since admission. She is not on any ARB or ACE.  - Her creatinine was 2.78 (BUN 106) on admission, now 2.91 (BUN 112)  4. Acute on chronic kidney disease, stage IV-V - As above - Worsening kidney function, has previously seen by nephrologist during 2013 admission, Recommended consult  5. Chronic Anemia with acute thrombocytopenia - Hgb of 10.2 on presentation --> now 8.6   Signed: Myleigh Amara, PA 06/07/2015, 4:36 PM Pager [phone]  Co-Sign MD

## 2015-06-07 NOTE — Progress Notes (Signed)
Pt bradycardic, stop coreg. Cardiology consulted.   Faye Ramsay, MD  [facility] Pager [phone]  If 7PM-7AM, please contact night-coverage [email or web address] Password [email or web address]

## 2015-06-08 LAB — BASIC METABOLIC PANEL
Anion gap: 10 (ref 5–15)
BUN: 117 mg/dL — AB (ref 6–20)
CALCIUM: 8.3 mg/dL — AB (ref 8.9–10.3)
CO2: 23 mmol/L (ref 22–32)
CREATININE: 2.85 mg/dL — AB (ref 0.44–1.00)
Chloride: 100 mmol/L — ABNORMAL LOW (ref 101–111)
GFR calc non Af Amer: 14 mL/min — ABNORMAL LOW (ref >60)
GFR, EST AFRICAN AMERICAN: 16 mL/min — AB (ref >60)
Glucose, Bld: 107 mg/dL — ABNORMAL HIGH (ref 65–99)
Potassium: 4.4 mmol/L (ref 3.5–5.1)
Sodium: 133 mmol/L — ABNORMAL LOW (ref 135–145)

## 2015-06-08 LAB — CBC
HEMATOCRIT: 26.9 % — AB (ref 36.0–46.0)
Hemoglobin: 8.7 g/dL — ABNORMAL LOW (ref 12.0–15.0)
MCH: 23.1 pg — AB (ref 26.0–34.0)
MCHC: 32.3 g/dL (ref 30.0–36.0)
MCV: 71.4 fL — ABNORMAL LOW (ref 78.0–100.0)
PLATELETS: 184 10*3/uL (ref 150–400)
RBC: 3.77 MIL/uL — AB (ref 3.87–5.11)
RDW: 17.6 % — ABNORMAL HIGH (ref 11.5–15.5)
WBC: 7 10*3/uL (ref 4.0–10.5)

## 2015-06-08 LAB — CULTURE, RESPIRATORY W GRAM STAIN

## 2015-06-08 LAB — GLUCOSE, CAPILLARY
GLUCOSE-CAPILLARY: 124 mg/dL — AB (ref 65–99)
GLUCOSE-CAPILLARY: 125 mg/dL — AB (ref 65–99)
Glucose-Capillary: 98 mg/dL (ref 65–99)
Glucose-Capillary: 161 mg/dL — ABNORMAL HIGH (ref 65–99)

## 2015-06-08 LAB — CULTURE, RESPIRATORY: Culture: NORMAL

## 2015-06-08 LAB — SODIUM, URINE, RANDOM: Sodium, Ur: 39 mmol/L

## 2015-06-08 LAB — CREATININE, URINE, RANDOM: Creatinine, Urine: 59.71 mg/dL

## 2015-06-08 LAB — PROCALCITONIN: PROCALCITONIN: 0.13 ng/mL

## 2015-06-08 MED ORDER — FUROSEMIDE 10 MG/ML IJ SOLN
80.0000 mg | Freq: Two times a day (BID) | INTRAMUSCULAR | Status: DC
  Administered 2015-06-08 – 2015-06-11 (×6): 80 mg via INTRAVENOUS
  Filled 2015-06-08 (×6): qty 8

## 2015-06-08 NOTE — Progress Notes (Signed)
Patient ID: [name], female   DOB: 1927-01-23, 81 y.o.   MRN: [id number]  [facility] PROGRESS NOTE  DEAVEN BARRON GBT:[id number] DOB: 07-05-1927 DOA: 06/04/2015 PCP: Simona Huh, MD   Brief narrative:    81 yo female with known peripheral vascular disease status post right AKA for nonhealing lower extremity ulcer in 2015, resident of ALF, with COPD, HTN, grade I diastolic CHF, CKD stage IV (follows with Dr. Justin Mend), presented to [facility] ED with chest pain and shortness of breath, associated with intermittent episodes of productive and non productive cough. Per ALF staff report pt with oxygen saturation in 80's on RA.  Patient had a rectal temperature of 100.4 in the ER, BP was 125/53, pulse was 61, O2 saturations were 96% on 4 L oxygen-she has been subsequently weaned down to 2 L oxygen.   Hospital course complicated by development of bradycardia, requiring discontinuation of Coreg and cardiology consult. While on Lasix, Cr trending up and nephrology consulted.   Assessment/Plan:    Principal Problem:   Acute respiratory failure with hypoxia - likely secondary to acute diastolic CHF rather than PNA - pt is afebrile and with WBC now WNL with stable HR and BP - lactic acid and procalcitonin level WNL as well and given recent C. Diff, will avoid ABX as possible - continue Lasix and follow up on cardiologist recommendations regarding the dosing   Active Problems:   Acute on chronic diastolic CHF - grade I per last 2 D ECHO in April 2014, repeat 2 D ECHO pending  - continue Lasix as noted above but increase the dose from 20 mg to 80 mg BID per nephrologist  - weight today is 83 kg (was 84 7/24, monitor daily weights, strict I/O    Bradycardia - better since Coreg stopped  - appreciate cardiology following, no recommendation for further ischemic work up     Fever - Tmax 99.9 F 7/23, resolved  - blood cultures with coag neg staph, likely contaminant so we have stopped vanc  7/25    S/P AKA (above knee amputation), right  - stable     Diet-controlled diabetes mellitus with complications of CKD and PVD - continue SSI    CKD (chronic kidney disease), stage IV - close monitoring while on Lasix IV, dose increased to 80 mg BID per nephrologist  - BMP in AM    Acute thrombocytopenia - close monitoring, Plt remain stable ~130K - CBC in AM    HTN (hypertension) - continue home medical regimen but hold coreg due to bradycardia     Morbid obesity with BMI > 35 and underlying risk factors of HTN, CKD - Body mass index is 35.79 kg/(m^2).  DVT prophylaxis - Heparin SQ  Code Status: DNR Family Communication:  plan of care discussed with the patient Disposition Plan: Home when stable.   IV access:  Peripheral IV  Procedures and diagnostic studies:    Dg Chest 2 View 06/04/2015  Central vascular congestion mild interstitial prominence bilaterally highly suspicious for pulmonary edema. No segmental infiltrates. Status post CABG. Degenerative changes bilateral shoulders.     Medical Consultants:  Cardiology Nephrology   Other Consultants:  None  IAnti-Infectives:   Vanc and fortaz stopped 7/23 but vanc resumed 7/24 --> 7/25  Faye Ramsay, MD  [facility] Pager [phone]  If 7PM-7AM, please contact night-coverage [email or web address] Password [facility] 06/08/2015, 4:31 PM   LOS: 4 days   HPI/Subjective: No events overnight.   Objective: Filed Vitals:   06/07/15  1756 06/07/15 1956 06/08/15 0508 06/08/15 0736  BP: 140/55 130/49 122/50 155/56  Pulse: 49 51 52 60  Temp: 98 F (36.7 C) 98.1 F (36.7 C) 98.3 F (36.8 C) 98.2 F (36.8 C)  TempSrc: Oral Oral Oral Axillary  Resp: 18  18 19   Height:      Weight:      SpO2: 97% 96% 94% 97%    Intake/Output Summary (Last 24 hours) at 06/08/15 1631 Last data filed at 06/08/15 1514  Gross per 24 hour  Intake    460 ml  Output    350 ml  Net    110 ml    Exam:   General:  Pt is alert, follows  commands appropriately, not in acute distress  Cardiovascular: Regular rhythm, bradycardia, S1/S2, no murmurs, no rubs, no gallops  Respiratory: Clear to auscultation bilaterally, crackles at bases   Abdomen: Soft, non tender, non distended, bowel sounds present, no guarding  Extremities: right AKA  Neuro: Grossly nonfocal  Data Reviewed: Basic Metabolic Panel:  Recent Labs Lab 06/04/15 0911 06/05/15 0249 06/06/15 0541 06/07/15 0614 06/08/15 0430  NA 140 137 134* 132* 133*  K 4.5 4.2 4.4 4.3 4.4  CL 106 104 101 100* 100*  CO2 21* 23 22 22 23   GLUCOSE 146* 102* 87 92 107*  BUN 106* 105* 113* 112* 117*  CREATININE 2.78* 2.73* 2.94* 2.91* 2.85*  CALCIUM 8.8* 8.0* 8.2* 8.4* 8.3*   Liver Function Tests:  Recent Labs Lab 06/04/15 0911  AST 24  ALT 8*  ALKPHOS 80  BILITOT 0.4  PROT 7.1  ALBUMIN 3.6   CBC:  Recent Labs Lab 06/04/15 0911 06/05/15 0249 06/06/15 0541 06/07/15 0614 06/08/15 0430  WBC 11.7* 9.3 7.0 7.0 7.0  NEUTROABS 9.9*  --   --   --   --   HGB 10.2* 8.2* 8.3* 8.6* 8.7*  HCT 31.5* 25.8* 25.5* 26.4* 26.9*  MCV 71.1* 71.9* 71.2* 70.8* 71.4*  PLT 205 148* 147* 166 184   CBG:  Recent Labs Lab 06/07/15 1154 06/07/15 1725 06/07/15 2114 06/08/15 0734 06/08/15 1215  GLUCAP 184* 134* 137* 98 161*    Recent Results (from the past 240 hour(s))  Blood Culture (routine x 2)     Status: None (Preliminary result)   Collection Time: 06/04/15 10:50 AM  Result Value Ref Range Status   Specimen Description BLOOD RIGHT HAND  Final   Special Requests BOTTLES DRAWN AEROBIC AND ANAEROBIC 5CC  Final   Culture NO GROWTH 4 DAYS  Final   Report Status PENDING  Incomplete  Blood Culture (routine x 2)     Status: None (Preliminary result)   Collection Time: 06/04/15 11:00 AM  Result Value Ref Range Status   Specimen Description BLOOD LEFT HAND  Final   Special Requests BOTTLES DRAWN AEROBIC AND ANAEROBIC 5CC  Final   Culture  Setup Time   Final    GRAM  POSITIVE COCCI IN CLUSTERS AEROBIC BOTTLE ONLY CRITICAL RESULT CALLED TO, READ BACK BY AND VERIFIED WITH: BRAKE,A RN 06/05/15 1915 WOOTEN,K CONFIRMED BY B MARTIN    Culture   Final    STAPHYLOCOCCUS SPECIES (COAGULASE NEGATIVE) THE SIGNIFICANCE OF ISOLATING THIS ORGANISM FROM A SINGLE SET OF BLOOD CULTURES WHEN MULTIPLE SETS ARE DRAWN IS UNCERTAIN. PLEASE NOTIFY THE MICROBIOLOGY DEPARTMENT WITHIN ONE WEEK IF SPECIATION AND SENSITIVITIES ARE REQUIRED.    Report Status PENDING  Incomplete  Urine culture     Status: None   Collection Time: 06/04/15  1:26 PM  Result Value Ref Range Status   Specimen Description URINE, CATHETERIZED  Final   Special Requests NONE  Final   Culture NO GROWTH 1 DAY  Final   Report Status 06/05/2015 FINAL  Final  Culture, sputum-assessment     Status: None   Collection Time: 06/05/15  6:44 PM  Result Value Ref Range Status   Specimen Description SPUTUM  Final   Special Requests NONE  Final   Sputum evaluation   Final    THIS SPECIMEN IS ACCEPTABLE. RESPIRATORY CULTURE REPORT TO FOLLOW.   Report Status 06/05/2015 FINAL  Final  Culture, respiratory (NON-Expectorated)     Status: None   Collection Time: 06/05/15  6:44 PM  Result Value Ref Range Status   Specimen Description SPUTUM  Final   Special Requests NONE  Final   Gram Stain   Final    FEW WBC PRESENT, PREDOMINANTLY MONONUCLEAR ABUNDANT SQUAMOUS EPITHELIAL CELLS PRESENT MODERATE GRAM POSITIVE COCCI IN CHAINS IN PAIRS FEW GRAM NEGATIVE RODS Performed at [facility]    Culture   Final    NORMAL OROPHARYNGEAL FLORA Performed at [facility]    Report Status 06/08/2015 FINAL  Final     Scheduled Meds: . allopurinol  100 mg Oral QHS  . amLODipine  10 mg Oral Daily  . antiseptic oral rinse  7 mL Mouth Rinse BID  . aspirin EC  81 mg Oral Daily  . citalopram  20 mg Oral Daily  . Darbepoetin Alfa  100 mcg Subcutaneous Q14 Days  . ezetimibe  10 mg Oral Daily  . famotidine  20 mg  Oral Daily  . furosemide  80 mg Intravenous BID  . heparin  5,000 Units Subcutaneous 3 times per day  . hydrALAZINE  100 mg Oral 3 times per day  . insulin aspart  0-5 Units Subcutaneous QHS  . insulin aspart  0-9 Units Subcutaneous TID WC  . iron polysaccharides  150 mg Oral BID  . isosorbide mononitrate  120 mg Oral Daily  . loratadine  10 mg Oral Daily  . montelukast  10 mg Oral QHS  . timolol  1 drop Both Eyes BID   Continuous Infusions:

## 2015-06-08 NOTE — Clinical Documentation Improvement (Signed)
Please document the clinical significance, if any, of the 1 positive blood culture: GRAM POSITIVE COCCI IN CLUSTERS  AEROBIC BOTTLE ONLY   Specimen Collected: 06/04/15 11:00 AM Last Resulted: 06/06/15 10:19 AM        Thank Dennis Bast, Erling Conte ,RN Clinical Documentation Specialist:  [phone] [facility] [employer] [facility]

## 2015-06-08 NOTE — Progress Notes (Signed)
Subjective:  Not currently short of breath today.  Said she had some chest pain last night but none today.  No PND.  Objective:  Vital Signs in the last 24 hours: BP 155/56 mmHg  Pulse 60  Temp(Src) 98.2 F (36.8 C) (Axillary)  Resp 19  Ht 5' (1.524 m)  Wt 83.12 kg (183 lb 3.9 oz)  BMI 35.79 kg/m2  SpO2 97%  Physical Exam: Elderly obese female lying in bed currently with no complaints Lungs: Reduced breath sounds  Cardiac:  Regular rhythm, normal S1 and S2, no S3 Abdomen:  Soft, nontender, no masses Extremities:  Right amputation noted.  Diminished pulse on left leg.  No edema present  Intake/Output from previous day: 07/25 0701 - 07/26 0700 In: 460 [P.O.:460] Out: 350 [Urine:350]  Weight Filed Weights   06/04/15 2102 06/05/15 2107 06/06/15 2145  Weight: 84 kg (185 lb 3 oz) 84.763 kg (186 lb 13.9 oz) 83.12 kg (183 lb 3.9 oz)    Lab Results: Basic Metabolic Panel:  Recent Labs  06/07/15 0614 06/08/15 0430  NA 132* 133*  K 4.3 4.4  CL 100* 100*  CO2 22 23  GLUCOSE 92 107*  BUN 112* 117*  CREATININE 2.91* 2.85*   CBC:  Recent Labs  06/07/15 0614 06/08/15 0430  WBC 7.0 7.0  HGB 8.6* 8.7*  HCT 26.4* 26.9*  MCV 70.8* 71.4*  PLT 166 184   Telemetry: Sinus bradycardia which is improved  Assessment/Plan:  1.  Asymptomatic bradycardia with first-degree AV block 2.  Acute on chronic diastolic heart failure clinically better 3.  Acute on chronic kidney disease stage 4-5 4.  Peripheral vascular disease with prior amputation 5.  Worsening anemia  Recommendations:  Awaiting echocardiogram.  Her bradycardia has improved with stopping her beta blocker.  Would recommend renal consult and spoke to Dr. Justin Mend who will see the patient.  The patient is not a candidate for further cardiac interventions due to renal failure her age and comorbidities.  There is no value in an ischemic workup.     Kerry Hough  MD [facility]  06/08/2015, 1:48  PM

## 2015-06-08 NOTE — Consult Note (Signed)
Reason for Consult:AKI Referring Physician: Dr. Evalyn Casco is an 81 y.o. female.  HPI: 81 y/o F w/ PMHx of PVD s/p right AKA, DM, HTN, CHF, COPD, HLD, and CKD stage 4 who presented with chest pain on 7/22 that resolved in the ED. She also had SOB, a productive cough, and was noted to be hypoxic w/ O2 sats in the 80's reported by staff at her ALF. Chest xray revealed PNA vs CHF which is felt to be the most likely cause of her SOB. She was started on lasix 77m IV BID and cardiology was consulted due to bradycardia that developed the night of 7/25. Cardiology stopped her home coreg and an ECHO is pending. They feel that patient is not a candidate for further cardiac interventions due to her renal failure, age, and other comorbidities. Her creatinine was 2.78 on admission, her baseline is around 2-3.0. She denies dysuria, foul odor to urine, abd pain, frequency, fevers, and flank pain. She is on tramadol at home but states she at most will take it once daily and does not take it everyday. Denies recent contrast exposure, diarrhea, and n/v. She endorses a good appetite and has good intake of fluids. Nephrology was consulted for acute on chronic kidney injury.    Past Medical History  Diagnosis Date  . DM (diabetes mellitus) type II controlled peripheral vascular disorder     A1C = 5.8  . HTN (hypertension)   . CHF (congestive heart failure)     EF = 562-13% grade 2 diastolic dysfunction  . CAD (coronary artery disease)   . COPD (chronic obstructive pulmonary disease)   . Cataracts, bilateral   . Hyperlipidemia   . Peripheral vascular disease bilateral    S/P femoral-popliteal bypass surgery  . Glaucoma   . TIA (transient ischemic attack)   . Shortness of breath   . Pneumonia     HX OF PNA  . Depression   . GERD (gastroesophageal reflux disease)   . Arthritis     RA  . Anemia   . CKD (chronic kidney disease)   . DVT (deep venous thrombosis)   . Cancer     Rt breast  . Hypoxia  06/04/2015    Past Surgical History  Procedure Laterality Date  . Coronary artery bypass graft    . Spine surgery    . Eye surgery  cataract  . Carpal tunnel release  left  . Breast surgery  lumpectomy right  . Aortogram w/ runoff  06/06/11    right leg  . Above knee leg amputation  07/27/11    Right AKA    Family History  Problem Relation Age of Onset  . Hypertension Mother   . Peripheral vascular disease Mother   . Diabetes Father   . Kidney disease Sister   . Cancer Brother     lung  . Lung disease Sister   . Lung disease Sister   . Kidney disease Daughter     On dialysis, died in 22007/03/02   Social History:  reports that she quit smoking about 38 years ago. Her smoking use included Cigarettes. She has a 40 pack-year smoking history. She has never used smokeless tobacco. She reports that she does not drink alcohol or use illicit drugs.  Allergies:  Allergies  Allergen Reactions  . Celebrex [Celecoxib] Rash  . Codeine Other (See Comments)    REACTION: unknown  . Iodine Rash  . Omnipaque [Iohexol] Other (See Comments)  REACTION: unknown    Medications:  Prior to Admission:  Prescriptions prior to admission  Medication Sig Dispense Refill Last Dose  . acetaminophen (TYLENOL) 500 MG tablet Take 500 mg by mouth every 6 (six) hours as needed for mild pain.   Past Month at Unknown time  . allopurinol (ZYLOPRIM) 100 MG tablet Take 1 tablet (100 mg total) by mouth at bedtime. 30 tablet 1 06/03/2015 at Unknown time  . amLODipine (NORVASC) 10 MG tablet Take 1 tablet (10 mg total) by mouth daily. 30 tablet 1 06/03/2015 at Unknown time  . aspirin EC 81 MG tablet Take 81 mg by mouth daily.   06/03/2015 at Unknown time  . B Complex-C-Folic Acid (VOL-CARE RX PO) Take 1 tablet by mouth daily. Nephro-Vite 0.8 mg qd   06/03/2015 at Unknown time  . carvedilol (COREG) 3.125 MG tablet Take 1 tablet (3.125 mg total) by mouth 2 (two) times daily with a meal. 60 tablet 1 06/03/2015 at 1600  .  citalopram (CELEXA) 10 MG tablet Take 1 tablet (10 mg total) by mouth daily. (Patient taking differently: Take 20 mg by mouth daily. ) 30 tablet 1 06/03/2015 at Unknown time  . cloNIDine (CATAPRES) 0.1 MG tablet Take 0.1 mg by mouth once as needed (if sbp >180 and dbp >110).    Past Month at Unknown time  . clotrimazole (LOTRIMIN) 1 % external solution Apply 1 application topically 2 (two) times daily as needed (fungal rash on foot).   Past Month at Unknown time  . COLCRYS 0.6 MG tablet Take 0.6 mg by mouth daily as needed (acute gout flare).    unk at unk  . ezetimibe (ZETIA) 10 MG tablet Take 1 tablet (10 mg total) by mouth daily. 30 tablet 1 06/03/2015 at Unknown time  . famotidine (PEPCID) 20 MG tablet Take 1 tablet (20 mg total) by mouth daily. 30 tablet 1 06/03/2015 at Unknown time  . furosemide (LASIX) 40 MG tablet Take 1.5 tablets (60 mg total) by mouth 2 (two) times daily. 90 tablet 1 06/03/2015 at Unknown time  . Glucosamine 500 MG CAPS Take 1,500 mg by mouth 3 (three) times daily.   06/03/2015 at Unknown time  . hydrALAZINE (APRESOLINE) 100 MG tablet Take 1 tablet (100 mg total) by mouth 3 (three) times daily. 90 tablet 1 06/03/2015 at Unknown time  . iron polysaccharides (NIFEREX) 150 MG capsule Take 1 capsule (150 mg total) by mouth 2 (two) times daily. 60 capsule 1 06/03/2015 at Unknown time  . isosorbide mononitrate (IMDUR) 120 MG 24 hr tablet Take 1 tablet (120 mg total) by mouth daily. 30 tablet 1 06/03/2015 at Unknown time  . ketoconazole (NIZORAL) 2 % cream Apply 1 application topically daily as needed for irritation.    unk at unk  . loratadine (CLARITIN) 10 MG tablet Take 1 tablet (10 mg total) by mouth daily. 30 tablet 1 06/03/2015 at Unknown time  . montelukast (SINGULAIR) 10 MG tablet Take 1 tablet (10 mg total) by mouth at bedtime. 30 tablet 1 06/03/2015 at Unknown time  . polyethylene glycol (MIRALAX / GLYCOLAX) packet Take 17 g by mouth daily as needed for mild constipation.   unk at  [employer]  . PROCRIT 62703 UNIT/ML injection Inject 20,000 Units into the skin every 14 (fourteen) days.   Past Month at Unknown time  . sennosides-docusate sodium (SENOKOT-S) 8.6-50 MG tablet Take 1 tablet by mouth daily as needed for constipation.   unk at unk  . timolol (BETIMOL) 0.5 %  ophthalmic solution Place 1 drop into both eyes 2 (two) times daily.   06/03/2015 at Unknown time  . traMADol (ULTRAM) 50 MG tablet Take 1 tablet (50 mg total) by mouth every 12 (twelve) hours as needed (1 to 2 tablets for pain not relieved by acetaminophen). (Patient taking differently: Take 50 mg by mouth every 6 (six) hours as needed. ) 60 tablet 0 Past Month at Unknown time  . triamcinolone cream (KENALOG) 0.1 % Apply 1 application topically 2 (two) times daily as needed (eczema).    Past Month at Unknown time  . capsaicin (ZOSTRIX) 0.025 % cream Apply topically 2 (two) times daily. (Patient not taking: Reported on 06/03/2015) 60 g 0 Not Taking   Scheduled: . allopurinol  100 mg Oral QHS  . amLODipine  10 mg Oral Daily  . antiseptic oral rinse  7 mL Mouth Rinse BID  . aspirin EC  81 mg Oral Daily  . citalopram  20 mg Oral Daily  . Darbepoetin Alfa  100 mcg Subcutaneous Q14 Days  . ezetimibe  10 mg Oral Daily  . famotidine  20 mg Oral Daily  . furosemide  20 mg Intravenous BID  . heparin  5,000 Units Subcutaneous 3 times per day  . hydrALAZINE  100 mg Oral 3 times per day  . insulin aspart  0-5 Units Subcutaneous QHS  . insulin aspart  0-9 Units Subcutaneous TID WC  . iron polysaccharides  150 mg Oral BID  . isosorbide mononitrate  120 mg Oral Daily  . loratadine  10 mg Oral Daily  . montelukast  10 mg Oral QHS  . timolol  1 drop Both Eyes BID   Continuous:    Results for orders placed or performed during the hospital encounter of 06/04/15 (from the past 48 hour(s))  Glucose, capillary     Status: Abnormal   Collection Time: 06/06/15  4:52 PM  Result Value Ref Range   Glucose-Capillary 153 (H) 65 - 99  mg/dL  Glucose, capillary     Status: None   Collection Time: 06/06/15  9:43 PM  Result Value Ref Range   Glucose-Capillary 91 65 - 99 mg/dL  CBC     Status: Abnormal   Collection Time: 06/07/15  6:14 AM  Result Value Ref Range   WBC 7.0 4.0 - 10.5 K/uL   RBC 3.73 (L) 3.87 - 5.11 MIL/uL   Hemoglobin 8.6 (L) 12.0 - 15.0 g/dL   HCT 26.4 (L) 36.0 - 46.0 %   MCV 70.8 (L) 78.0 - 100.0 fL   MCH 23.1 (L) 26.0 - 34.0 pg   MCHC 32.6 30.0 - 36.0 g/dL   RDW 17.6 (H) 11.5 - 15.5 %   Platelets 166 150 - 400 K/uL  Basic metabolic panel     Status: Abnormal   Collection Time: 06/07/15  6:14 AM  Result Value Ref Range   Sodium 132 (L) 135 - 145 mmol/L   Potassium 4.3 3.5 - 5.1 mmol/L   Chloride 100 (L) 101 - 111 mmol/L   CO2 22 22 - 32 mmol/L   Glucose, Bld 92 65 - 99 mg/dL   BUN 112 (H) 6 - 20 mg/dL   Creatinine, Ser 2.91 (H) 0.44 - 1.00 mg/dL   Calcium 8.4 (L) 8.9 - 10.3 mg/dL   GFR calc non Af Amer 14 (L) >60 mL/min   GFR calc Af Amer 16 (L) >60 mL/min    Comment: (NOTE) The eGFR has been calculated using the CKD EPI equation.  This calculation has not been validated in all clinical situations. eGFR's persistently <60 mL/min signify possible Chronic Kidney Disease.    Anion gap 10 5 - 15  Glucose, capillary     Status: Abnormal   Collection Time: 06/07/15  7:44 AM  Result Value Ref Range   Glucose-Capillary 103 (H) 65 - 99 mg/dL  Glucose, capillary     Status: Abnormal   Collection Time: 06/07/15 11:54 AM  Result Value Ref Range   Glucose-Capillary 184 (H) 65 - 99 mg/dL  Glucose, capillary     Status: Abnormal   Collection Time: 06/07/15  5:25 PM  Result Value Ref Range   Glucose-Capillary 134 (H) 65 - 99 mg/dL  Glucose, capillary     Status: Abnormal   Collection Time: 06/07/15  9:14 PM  Result Value Ref Range   Glucose-Capillary 137 (H) 65 - 99 mg/dL  Procalcitonin     Status: None   Collection Time: 06/08/15  4:30 AM  Result Value Ref Range   Procalcitonin 0.13 ng/mL     Comment:        Interpretation: PCT (Procalcitonin) <= 0.5 ng/mL: Systemic infection (sepsis) is not likely. Local bacterial infection is possible. (NOTE)         ICU PCT Algorithm               Non ICU PCT Algorithm    ----------------------------     ------------------------------         PCT < 0.25 ng/mL                 PCT < 0.1 ng/mL     Stopping of antibiotics            Stopping of antibiotics       strongly encouraged.               strongly encouraged.    ----------------------------     ------------------------------       PCT level decrease by               PCT < 0.25 ng/mL       >= 80% from peak PCT       OR PCT 0.25 - 0.5 ng/mL          Stopping of antibiotics                                             encouraged.     Stopping of antibiotics           encouraged.    ----------------------------     ------------------------------       PCT level decrease by              PCT >= 0.25 ng/mL       < 80% from peak PCT        AND PCT >= 0.5 ng/mL            Continuin g antibiotics                                              encouraged.       Continuing antibiotics            encouraged.    ----------------------------     ------------------------------  PCT level increase compared          PCT > 0.5 ng/mL         with peak PCT AND          PCT >= 0.5 ng/mL             Escalation of antibiotics                                          strongly encouraged.      Escalation of antibiotics        strongly encouraged.   CBC     Status: Abnormal   Collection Time: 06/08/15  4:30 AM  Result Value Ref Range   WBC 7.0 4.0 - 10.5 K/uL   RBC 3.77 (L) 3.87 - 5.11 MIL/uL   Hemoglobin 8.7 (L) 12.0 - 15.0 g/dL   HCT 26.9 (L) 36.0 - 46.0 %   MCV 71.4 (L) 78.0 - 100.0 fL   MCH 23.1 (L) 26.0 - 34.0 pg   MCHC 32.3 30.0 - 36.0 g/dL   RDW 17.6 (H) 11.5 - 15.5 %   Platelets 184 150 - 400 K/uL  Basic metabolic panel     Status: Abnormal   Collection Time: 06/08/15  4:30 AM  Result  Value Ref Range   Sodium 133 (L) 135 - 145 mmol/L   Potassium 4.4 3.5 - 5.1 mmol/L   Chloride 100 (L) 101 - 111 mmol/L   CO2 23 22 - 32 mmol/L   Glucose, Bld 107 (H) 65 - 99 mg/dL   BUN 117 (H) 6 - 20 mg/dL   Creatinine, Ser 2.85 (H) 0.44 - 1.00 mg/dL   Calcium 8.3 (L) 8.9 - 10.3 mg/dL   GFR calc non Af Amer 14 (L) >60 mL/min   GFR calc Af Amer 16 (L) >60 mL/min    Comment: (NOTE) The eGFR has been calculated using the CKD EPI equation. This calculation has not been validated in all clinical situations. eGFR's persistently <60 mL/min signify possible Chronic Kidney Disease.    Anion gap 10 5 - 15  Glucose, capillary     Status: None   Collection Time: 06/08/15  7:34 AM  Result Value Ref Range   Glucose-Capillary 98 65 - 99 mg/dL  Glucose, capillary     Status: Abnormal   Collection Time: 06/08/15 12:15 PM  Result Value Ref Range   Glucose-Capillary 161 (H) 65 - 99 mg/dL    No results found.  Review of Systems  Constitutional: Negative for fever, chills and weight loss.  Respiratory: Positive for cough, hemoptysis and shortness of breath.   Cardiovascular: Negative for chest pain, orthopnea and leg swelling.  Gastrointestinal: Negative for nausea, vomiting and diarrhea.  Genitourinary: Negative for dysuria, urgency, frequency and hematuria.   Blood pressure 155/56, pulse 60, temperature 98.2 F (36.8 C), temperature source Axillary, resp. rate 19, height 5' (1.524 m), weight 183 lb 3.9 oz (83.12 kg), SpO2 97 %. Physical Exam  Constitutional: She appears well-developed and well-nourished. No distress.  HENT:  Head: Normocephalic.  Eyes: Conjunctivae are normal.  Cardiovascular: Normal rate and regular rhythm.   No murmur heard. Respiratory: Effort normal. She has no wheezes. She has rales (rt basilar crackles).  GI: Soft. Bowel sounds are normal. She exhibits no distension. There is no tenderness. There is no rebound and no guarding.  Musculoskeletal: She exhibits no  edema.  Right AKA  Neurological: She is alert.  Skin: Skin is warm and dry.     Assessment/Plan: 1. Chronic kidney disease stage 4 - creatinine 2.78 on admisison, now 2.85. Her baseline creatinine is around 2-3.0. She is on lasix 30m BID at home and has been receiving lasix IV 225mBID during this admission. Urine output has not been strictly measured as she is incontinent and does not have a foley in.  She was given a dose of vanc in the ED which could cause an AKI. UA unrevealing.  - hold ultram - FENa - strict I/Os, ordered foley cath, daily weights - increase lasix IV from 2082mID to 61m61mD  - will follow chem panel - CBC w/ diff for eosinophils if medication induced.  - avoid nephrotoxins and renal dose meds 2. Acute CHF exacerbation - ECHO pending, lasix as above 3 Hypertension- per primary 4. Anemia- stable,  can consider repeat anemia panel and starting ESA this admission or as outpatient.  5. Bradycardia- cardiology following, stopped home coreg, not a candidate for further interventions   TruoJulious Oka6/2016, 2:35 PM

## 2015-06-09 ENCOUNTER — Ambulatory Visit (HOSPITAL_COMMUNITY): Payer: Medicare Other

## 2015-06-09 DIAGNOSIS — R509 Fever, unspecified: Secondary | ICD-10-CM

## 2015-06-09 DIAGNOSIS — Z89611 Acquired absence of right leg above knee: Secondary | ICD-10-CM

## 2015-06-09 LAB — GLUCOSE, CAPILLARY
GLUCOSE-CAPILLARY: 134 mg/dL — AB (ref 65–99)
GLUCOSE-CAPILLARY: 107 mg/dL — AB (ref 65–99)
Glucose-Capillary: 92 mg/dL (ref 65–99)
Glucose-Capillary: 124 mg/dL — ABNORMAL HIGH (ref 65–99)
Glucose-Capillary: 146 mg/dL — ABNORMAL HIGH (ref 65–99)

## 2015-06-09 LAB — BASIC METABOLIC PANEL
Anion gap: 10 (ref 5–15)
BUN: 113 mg/dL — ABNORMAL HIGH (ref 6–20)
CHLORIDE: 103 mmol/L (ref 101–111)
CO2: 23 mmol/L (ref 22–32)
Calcium: 8.8 mg/dL — ABNORMAL LOW (ref 8.9–10.3)
Creatinine, Ser: 2.59 mg/dL — ABNORMAL HIGH (ref 0.44–1.00)
GFR calc Af Amer: 18 mL/min — ABNORMAL LOW (ref >60)
GFR calc non Af Amer: 16 mL/min — ABNORMAL LOW (ref >60)
Glucose, Bld: 99 mg/dL (ref 65–99)
Potassium: 4.5 mmol/L (ref 3.5–5.1)
Sodium: 136 mmol/L (ref 135–145)

## 2015-06-09 LAB — CBC WITH DIFFERENTIAL/PLATELET
Basophils Absolute: 0 10*3/uL (ref 0.0–0.1)
Basophils Relative: 0 % (ref 0–1)
EOS ABS: 0.4 10*3/uL (ref 0.0–0.7)
EOS PCT: 5 % (ref 0–5)
HCT: 27.3 % — ABNORMAL LOW (ref 36.0–46.0)
Hemoglobin: 8.8 g/dL — ABNORMAL LOW (ref 12.0–15.0)
LYMPHS ABS: 0.9 10*3/uL (ref 0.7–4.0)
LYMPHS PCT: 11 % — AB (ref 12–46)
MCH: 23 pg — ABNORMAL LOW (ref 26.0–34.0)
MCHC: 32.2 g/dL (ref 30.0–36.0)
MCV: 71.5 fL — ABNORMAL LOW (ref 78.0–100.0)
MONO ABS: 0.7 10*3/uL (ref 0.1–1.0)
Monocytes Relative: 9 % (ref 3–12)
Neutro Abs: 6.5 10*3/uL (ref 1.7–7.7)
Neutrophils Relative %: 76 % (ref 43–77)
Platelets: 174 10*3/uL (ref 150–400)
RBC: 3.82 MIL/uL — ABNORMAL LOW (ref 3.87–5.11)
RDW: 17.5 % — AB (ref 11.5–15.5)
WBC: 8.6 10*3/uL (ref 4.0–10.5)

## 2015-06-09 LAB — CULTURE, BLOOD (ROUTINE X 2): CULTURE: NO GROWTH

## 2015-06-09 MED ORDER — MUSCLE RUB 10-15 % EX CREA
TOPICAL_CREAM | CUTANEOUS | Status: DC | PRN
  Filled 2015-06-09: qty 85

## 2015-06-09 MED ORDER — TROLAMINE SALICYLATE 10 % EX CREA
TOPICAL_CREAM | Freq: Two times a day (BID) | CUTANEOUS | Status: DC | PRN
  Filled 2015-06-09: qty 85

## 2015-06-09 MED ORDER — TRAMADOL HCL 50 MG PO TABS
50.0000 mg | ORAL_TABLET | Freq: Four times a day (QID) | ORAL | Status: DC | PRN
  Administered 2015-06-10: 50 mg via ORAL
  Filled 2015-06-09: qty 1

## 2015-06-09 NOTE — Progress Notes (Signed)
  Echocardiogram 2D Echocardiogram has been performed.  [name] 06/09/2015, 11:24 AM

## 2015-06-09 NOTE — Progress Notes (Signed)
[facility]                                                                              Patient Demographics  [name], is a 81 y.o. female, DOB - 09/28/1927, PIR:[id number]  Admit date - 06/04/2015   Admitting Physician Ripudeep Krystal Eaton, MD  Outpatient Primary MD for the patient is Simona Huh, MD  LOS - 5   Chief Complaint  Patient presents with  . Chest Pain      HPI on 06/04/2015 by Ms. Erin Hearing, NP This is a 37 showed female patient with known peripheral vascular disease status post right AKA for nonhealing lower extremity ulcer in 2015. Because of profound deconditioning and issues with mobility patient resides at assisted living facility. Other past medical history includes diet-controlled diabetes, COPD, hypertension, grade 1 diastolic heart failure, chronic kidney disease stage IV followed by Dr. Justin Mend and breast cancer. She presented to the ER today with reports of chest pain that began around 7:30 today. By the time she had arrived to the ER the chest pain had resolved and patient was primarily complaining of a cough which is chronic. It was reported that when they arrived to the facility patient was maintaining O2 saturations of 84% on room air. Because of report of chest pain MS also give the patient aspirin. After arrival to this facility patient did have productive sputum that was blood streaked in appearance. Patient had a rectal temperature of 100.4 in the ER, BP was 12 5/53, pulse was 61, O2 saturations were 96% on 4 L oxygen-she has been subsequently weaned down to 2 L oxygen. Chest x-ray was read as central vascular congestion with mild interstitial prominence bilaterally highly suspicious for pulmonary edema. On review of chest films there was a lacy type pattern that appears more consistent with viral pneumonitis. After arrival patient also began complaining of issues related to dysuria. Toward data revealed leukocytosis with a white count of 11,700 and a  left shift with neutrophils 85%. Lactic acid was normal at 1.36, BNP was 308. Patient appeared to be in acute renal failure with a BUN of 106 noting one year ago BUN was 75, and creatinine 2.78 with creatinine only slightly higher than baseline 1 year ago. GFR 17, glucose 146, CO2 slightly decreased to 21. Patient later clarified her chest pain as being left lateral neck pain extending to the chest that was worsened when she turned her head.  Assessment & Plan   Acute respiratory failure with hypoxia -Improving, likely secondary to acute diastolic CHF rather than PNA -Patient currently afebrile, no leukocytosis -Lactic acid and procalcitonin level WNL as well and given recent C. Diff, will avoid ABX as possible -Continue Lasix and follow up on cardiology/nephrology recommendations regarding the dosing  -Continue supplemental oxygen to maintain sats above 92%.  Acute on chronic diastolic CHF -Echocardiogram in April 6301: grade I diastolic dysfunction -continue Lasix as noted above but increase the dose from 20 mg to 80 mg BID IV per nephrologist  -Doubt daily weights, intake and output are being monitored appropriately -Urine output over the past 24 hours 900 -Pending repeat echocardiogram -Cardiology consultation appreciated, no further ischemic workup needed  at this point.  Bradycardia -better since Coreg stopped  -appreciate cardiology following, no recommendation for further ischemic work up   Fever -Tmax 99.9 F 7/23, resolved  -blood cultures with coag neg staph, likely contaminant so we have stopped vanc 7/25  S/P AKA (above knee amputation), right  -stable   Diet-controlled diabetes mellitus with complications of CKD and PVD -continue insulin sliding scale CBG monitoring  CKD (chronic kidney disease), stage IV -close monitoring while on Lasix IV, dose increased to 80 mg BID per nephrologist  -Creatinine currently 2.59, improving -Continue to monitor BMP  Acute  thrombocytopenia -close monitoring, Plt remain stable ~130K -Continue to monitor CBC  HTN (hypertension) -continue home medical regimen  but hold coreg due to bradycardia   Morbid obesity with BMI > 35 and underlying risk factors of HTN, CKD -Body mass index is 35.79 kg/(m^2).  Left shoulder pain and crepitus -Per patient this has been ongoing for over a year. Patient received injections in her right shoulder. Spoke with granddaughter, this has also been ongoing issue. -Continue pain control  Code Status: DNR  Family Communication: None at bedside.  Granddaughter via phone  Disposition Plan: Admitted.  Continue IV lasix and monitoring.   Time Spent in minutes   30 minutes  Procedures  Echcoardiogram  Consults   Nephrology Ccardiology  DVT Prophylaxis  Heparin  Lab Results  Component Value Date   PLT 174 06/09/2015    Medications  Scheduled Meds: . allopurinol  100 mg Oral QHS  . amLODipine  10 mg Oral Daily  . antiseptic oral rinse  7 mL Mouth Rinse BID  . aspirin EC  81 mg Oral Daily  . citalopram  20 mg Oral Daily  . Darbepoetin Alfa  100 mcg Subcutaneous Q14 Days  . ezetimibe  10 mg Oral Daily  . famotidine  20 mg Oral Daily  . furosemide  80 mg Intravenous BID  . heparin  5,000 Units Subcutaneous 3 times per day  . hydrALAZINE  100 mg Oral 3 times per day  . insulin aspart  0-5 Units Subcutaneous QHS  . insulin aspart  0-9 Units Subcutaneous TID WC  . iron polysaccharides  150 mg Oral BID  . isosorbide mononitrate  120 mg Oral Daily  . loratadine  10 mg Oral Daily  . montelukast  10 mg Oral QHS  . timolol  1 drop Both Eyes BID   Continuous Infusions:  PRN Meds:.polyethylene glycol  Antibiotics    Anti-infectives    Start     Dose/Rate Route Frequency Ordered Stop   06/06/15 1200  vancomycin (VANCOCIN) IVPB 1000 mg/200 mL premix  Status:  Discontinued     1,000 mg 200 mL/hr over 60 Minutes Intravenous Every 48 hours 06/04/15 1250 06/05/15 1701    06/06/15 1200  vancomycin (VANCOCIN) IVPB 1000 mg/200 mL premix  Status:  Discontinued     1,000 mg 200 mL/hr over 60 Minutes Intravenous Every 48 hours 06/06/15 0825 06/07/15 1435   06/05/15 1200  cefTAZidime (FORTAZ) 1 g in dextrose 5 % 50 mL IVPB  Status:  Discontinued     1 g 100 mL/hr over 30 Minutes Intravenous Every 24 hours 06/04/15 1250 06/05/15 1701   06/04/15 1400  cefTAZidime (FORTAZ) 2 g in dextrose 5 % 50 mL IVPB  Status:  Discontinued     2 g 100 mL/hr over 30 Minutes Intravenous 3 times per day 06/04/15 0951 06/04/15 0957   06/04/15 1000  vancomycin (VANCOCIN) IVPB 1000 mg/200 mL  premix     1,000 mg 200 mL/hr over 60 Minutes Intravenous  Once 06/04/15 0951 06/04/15 1409   06/04/15 1000  cefTAZidime (FORTAZ) 2 g in dextrose 5 % 50 mL IVPB     2 g 100 mL/hr over 30 Minutes Intravenous  Once 06/04/15 6195 06/04/15 1227      Subjective:   [name] seen and examined today.  Patient feels her breathing has improved. She denies any chest pain at this time. Patient does complain of left shoulder pain however this has been ongoing for approximately one year. Currently denies any abdominal pain, nausea, vomiting, diarrhea, constipation, dizziness or headache.  Objective:   Filed Vitals:   06/08/15 1717 06/08/15 2117 06/09/15 0434 06/09/15 0845  BP: 140/48 132/46 129/44 111/49  Pulse: 51 57 57 64  Temp: 98.4 F (36.9 C) 98.5 F (36.9 C) 98.1 F (36.7 C) 97.9 F (36.6 C)  TempSrc: Axillary Oral Oral Oral  Resp: 18 17 18 18   Height:      Weight:  89.7 kg (197 lb 12 oz)    SpO2: 95% 94% 94% 94%    Wt Readings from Last 3 Encounters:  06/08/15 89.7 kg (197 lb 12 oz)  06/03/15 81.647 kg (180 lb)  05/18/14 83.915 kg (185 lb)     Intake/Output Summary (Last 24 hours) at 06/09/15 1230 Last data filed at 06/09/15 0932  Gross per 24 hour  Intake    360 ml  Output    900 ml  Net   -540 ml    Exam  General: Well developed, well nourished, NAD, appears stated  age  29: NCAT,mucous membranes moist.   Cardiovascular: S1 S2 auscultated, no rubs, murmurs or gallops. Regular rate and rhythm.  Respiratory: Clear to auscultation bilaterally with equal chest rise  Abdomen: Soft, obese, nontender, nondistended, + bowel sounds  Extremities: RAKA, No LLE edema.  LUE LROM, crepitus noted.  Neuro: AAOx3, nonfocal  Psych: Normal affect and demeanor   Data Review   Micro Results Recent Results (from the past 240 hour(s))  Blood Culture (routine x 2)     Status: None (Preliminary result)   Collection Time: 06/04/15 10:50 AM  Result Value Ref Range Status   Specimen Description BLOOD RIGHT HAND  Final   Special Requests BOTTLES DRAWN AEROBIC AND ANAEROBIC 5CC  Final   Culture NO GROWTH 4 DAYS  Final   Report Status PENDING  Incomplete  Blood Culture (routine x 2)     Status: None (Preliminary result)   Collection Time: 06/04/15 11:00 AM  Result Value Ref Range Status   Specimen Description BLOOD LEFT HAND  Final   Special Requests BOTTLES DRAWN AEROBIC AND ANAEROBIC 5CC  Final   Culture  Setup Time   Final    GRAM POSITIVE COCCI IN CLUSTERS AEROBIC BOTTLE ONLY CRITICAL RESULT CALLED TO, READ BACK BY AND VERIFIED WITH: BRAKE,A RN 06/05/15 [address] [place] BY B MARTIN    Culture   Final    STAPHYLOCOCCUS SPECIES (COAGULASE NEGATIVE) THE SIGNIFICANCE OF ISOLATING THIS ORGANISM FROM A SINGLE SET OF BLOOD CULTURES WHEN MULTIPLE SETS ARE DRAWN IS UNCERTAIN. PLEASE NOTIFY THE MICROBIOLOGY DEPARTMENT WITHIN ONE WEEK IF SPECIATION AND SENSITIVITIES ARE REQUIRED.    Report Status PENDING  Incomplete  Urine culture     Status: None   Collection Time: 06/04/15  1:26 PM  Result Value Ref Range Status   Specimen Description URINE, CATHETERIZED  Final   Special Requests NONE  Final  Culture NO GROWTH 1 DAY  Final   Report Status 06/05/2015 FINAL  Final  Culture, sputum-assessment     Status: None   Collection Time: 06/05/15  6:44 PM  Result  Value Ref Range Status   Specimen Description SPUTUM  Final   Special Requests NONE  Final   Sputum evaluation   Final    THIS SPECIMEN IS ACCEPTABLE. RESPIRATORY CULTURE REPORT TO FOLLOW.   Report Status 06/05/2015 FINAL  Final  Culture, respiratory (NON-Expectorated)     Status: None   Collection Time: 06/05/15  6:44 PM  Result Value Ref Range Status   Specimen Description SPUTUM  Final   Special Requests NONE  Final   Gram Stain   Final    FEW WBC PRESENT, PREDOMINANTLY MONONUCLEAR ABUNDANT SQUAMOUS EPITHELIAL CELLS PRESENT MODERATE GRAM POSITIVE COCCI IN CHAINS IN PAIRS FEW GRAM NEGATIVE RODS Performed at [facility]    Culture   Final    NORMAL OROPHARYNGEAL FLORA Performed at [facility]    Report Status 06/08/2015 FINAL  Final    Radiology Reports Dg Chest 2 View  06/04/2015   CLINICAL DATA:  Shortness of breath starting last night  EXAM: CHEST  2 VIEW  COMPARISON:  04/28/2014  FINDINGS: Cardiomediastinal silhouette is stable. Status post CABG. Degenerative changes bilateral shoulders. Surgical clips are noted in right axilla. There is central vascular congestion and mild interstitial prominence bilaterally highly suspicious for pulmonary edema. Mild basilar atelectasis without segmental infiltrates. Stable degenerative changes thoracic spine.  IMPRESSION: Central vascular congestion mild interstitial prominence bilaterally highly suspicious for pulmonary edema. No segmental infiltrates. Status post CABG. Degenerative changes bilateral shoulders.   Electronically Signed   By: Lahoma Crocker M.D.   On: 06/04/2015 10:20    CBC  Recent Labs Lab 06/04/15 0911 06/05/15 0249 06/06/15 0541 06/07/15 0614 06/08/15 0430 06/09/15 0515  WBC 11.7* 9.3 7.0 7.0 7.0 8.6  HGB 10.2* 8.2* 8.3* 8.6* 8.7* 8.8*  HCT 31.5* 25.8* 25.5* 26.4* 26.9* 27.3*  PLT 205 148* 147* 166 184 174  MCV 71.1* 71.9* 71.2* 70.8* 71.4* 71.5*  MCH 23.0* 22.8* 23.2* 23.1* 23.1* 23.0*  MCHC  32.4 31.8 32.5 32.6 32.3 32.2  RDW 17.5* 17.6* 17.4* 17.6* 17.6* 17.5*  LYMPHSABS 1.2  --   --   --   --  0.9  MONOABS 0.5  --   --   --   --  0.7  EOSABS 0.2  --   --   --   --  0.4  BASOSABS 0.0  --   --   --   --  0.0    Chemistries   Recent Labs Lab 06/04/15 0911 06/05/15 0249 06/06/15 0541 06/07/15 0614 06/08/15 0430 06/09/15 0515  NA 140 137 134* 132* 133* 136  K 4.5 4.2 4.4 4.3 4.4 4.5  CL 106 104 101 100* 100* 103  CO2 21* 23 22 22 23 23   GLUCOSE 146* 102* 87 92 107* 99  BUN 106* 105* 113* 112* 117* 113*  CREATININE 2.78* 2.73* 2.94* 2.91* 2.85* 2.59*  CALCIUM 8.8* 8.0* 8.2* 8.4* 8.3* 8.8*  AST 24  --   --   --   --   --   ALT 8*  --   --   --   --   --   ALKPHOS 80  --   --   --   --   --   BILITOT 0.4  --   --   --   --   --    ------------------------------------------------------------------------------------------------------------------  estimated creatinine clearance is 15.6 mL/min (by C-G formula based on Cr of 2.59). ------------------------------------------------------------------------------------------------------------------ No results for input(s): HGBA1C in the last 72 hours. ------------------------------------------------------------------------------------------------------------------ No results for input(s): CHOL, HDL, LDLCALC, TRIG, CHOLHDL, LDLDIRECT in the last 72 hours. ------------------------------------------------------------------------------------------------------------------ No results for input(s): TSH, T4TOTAL, T3FREE, THYROIDAB in the last 72 hours.  Invalid input(s): FREET3 ------------------------------------------------------------------------------------------------------------------ No results for input(s): VITAMINB12, FOLATE, FERRITIN, TIBC, IRON, RETICCTPCT in the last 72 hours.  Coagulation profile No results for input(s): INR, PROTIME in the last 168 hours.  No results for input(s): DDIMER in the last 72  hours.  Cardiac Enzymes No results for input(s): CKMB, TROPONINI, MYOGLOBIN in the last 168 hours.  Invalid input(s): CK ------------------------------------------------------------------------------------------------------------------ Invalid input(s): POCBNP    Saanvika Vazques D.O. on 06/09/2015 at 12:30 PM  Between 7am to 7pm - Pager - [phone]  After 7pm go to [email or web address] - password [email or web address]  And look for the night coverage person covering for me after hours  [facility] Office  [phone]

## 2015-06-09 NOTE — Progress Notes (Signed)
Subjective: Interval History:resting comfortably.   Objective: Vital signs in last 24 hours: Temp:  [97.9 F (36.6 C)-98.5 F (36.9 C)] 97.9 F (36.6 C) (07/27 0845) Pulse Rate:  [51-64] 64 (07/27 0845) Resp:  [17-18] 18 (07/27 0845) BP: (111-140)/(44-49) 111/49 mmHg (07/27 0845) SpO2:  [94 %-95 %] 94 % (07/27 0845) Weight:  [197 lb 12 oz (89.7 kg)] 197 lb 12 oz (89.7 kg) (07/26 2117) Weight change:   Intake/Output from previous day: 07/26 0701 - 07/27 0700 In: 29 [P.O.:580] Out: 900 [Urine:900] Intake/Output this shift:    Physical Exam  Constitutional: She appears well-developed and well-nourished. No distress.  Eyes: Conjunctivae are normal.  Cardiovascular: Normal rate and regular rhythm.   No murmur heard. Pulmonary/Chest: Effort normal and breath sounds normal. No respiratory distress. She has no wheezes. She has no rales.  Abdominal: Soft. Bowel sounds are normal. She exhibits no distension.  Musculoskeletal: She exhibits no edema.  Right AKA   Neurological: She is alert.  Skin: Skin is warm and dry.     Lab Results:  Recent Labs  06/08/15 0430 06/09/15 0515  WBC 7.0 8.6  HGB 8.7* 8.8*  HCT 26.9* 27.3*  PLT 184 174   BMET:  Recent Labs  06/08/15 0430 06/09/15 0515  NA 133* 136  K 4.4 4.5  CL 100* 103  CO2 23 23  GLUCOSE 107* 99  BUN 117* 113*  CREATININE 2.85* 2.59*  CALCIUM 8.3* 8.8*   No results for input(s): PTH in the last 72 hours. Iron Studies: No results for input(s): IRON, TIBC, TRANSFERRIN, FERRITIN in the last 72 hours. CBG (last 3)   Recent Labs  06/08/15 1714 06/08/15 2119 06/09/15 0732  GLUCAP 124* 125* 92     Studies/Results: No results found.  Prior to Admission:  Prescriptions prior to admission  Medication Sig Dispense Refill Last Dose  . acetaminophen (TYLENOL) 500 MG tablet Take 500 mg by mouth every 6 (six) hours as needed for mild pain.   Past Month at Unknown time  . allopurinol (ZYLOPRIM) 100 MG tablet  Take 1 tablet (100 mg total) by mouth at bedtime. 30 tablet 1 06/03/2015 at Unknown time  . amLODipine (NORVASC) 10 MG tablet Take 1 tablet (10 mg total) by mouth daily. 30 tablet 1 06/03/2015 at Unknown time  . aspirin EC 81 MG tablet Take 81 mg by mouth daily.   06/03/2015 at Unknown time  . B Complex-C-Folic Acid (VOL-CARE RX PO) Take 1 tablet by mouth daily. Nephro-Vite 0.8 mg qd   06/03/2015 at Unknown time  . carvedilol (COREG) 3.125 MG tablet Take 1 tablet (3.125 mg total) by mouth 2 (two) times daily with a meal. 60 tablet 1 06/03/2015 at 1600  . citalopram (CELEXA) 10 MG tablet Take 1 tablet (10 mg total) by mouth daily. (Patient taking differently: Take 20 mg by mouth daily. ) 30 tablet 1 06/03/2015 at Unknown time  . cloNIDine (CATAPRES) 0.1 MG tablet Take 0.1 mg by mouth once as needed (if sbp >180 and dbp >110).    Past Month at Unknown time  . clotrimazole (LOTRIMIN) 1 % external solution Apply 1 application topically 2 (two) times daily as needed (fungal rash on foot).   Past Month at Unknown time  . COLCRYS 0.6 MG tablet Take 0.6 mg by mouth daily as needed (acute gout flare).    unk at unk  . ezetimibe (ZETIA) 10 MG tablet Take 1 tablet (10 mg total) by mouth daily. 30 tablet 1 06/03/2015  at Unknown time  . famotidine (PEPCID) 20 MG tablet Take 1 tablet (20 mg total) by mouth daily. 30 tablet 1 06/03/2015 at Unknown time  . furosemide (LASIX) 40 MG tablet Take 1.5 tablets (60 mg total) by mouth 2 (two) times daily. 90 tablet 1 06/03/2015 at Unknown time  . Glucosamine 500 MG CAPS Take 1,500 mg by mouth 3 (three) times daily.   06/03/2015 at Unknown time  . hydrALAZINE (APRESOLINE) 100 MG tablet Take 1 tablet (100 mg total) by mouth 3 (three) times daily. 90 tablet 1 06/03/2015 at Unknown time  . iron polysaccharides (NIFEREX) 150 MG capsule Take 1 capsule (150 mg total) by mouth 2 (two) times daily. 60 capsule 1 06/03/2015 at Unknown time  . isosorbide mononitrate (IMDUR) 120 MG 24 hr tablet Take  1 tablet (120 mg total) by mouth daily. 30 tablet 1 06/03/2015 at Unknown time  . ketoconazole (NIZORAL) 2 % cream Apply 1 application topically daily as needed for irritation.    unk at unk  . loratadine (CLARITIN) 10 MG tablet Take 1 tablet (10 mg total) by mouth daily. 30 tablet 1 06/03/2015 at Unknown time  . montelukast (SINGULAIR) 10 MG tablet Take 1 tablet (10 mg total) by mouth at bedtime. 30 tablet 1 06/03/2015 at Unknown time  . polyethylene glycol (MIRALAX / GLYCOLAX) packet Take 17 g by mouth daily as needed for mild constipation.   unk at [employer]  . PROCRIT 75916 UNIT/ML injection Inject 20,000 Units into the skin every 14 (fourteen) days.   Past Month at Unknown time  . sennosides-docusate sodium (SENOKOT-S) 8.6-50 MG tablet Take 1 tablet by mouth daily as needed for constipation.   unk at unk  . timolol (BETIMOL) 0.5 % ophthalmic solution Place 1 drop into both eyes 2 (two) times daily.   06/03/2015 at Unknown time  . traMADol (ULTRAM) 50 MG tablet Take 1 tablet (50 mg total) by mouth every 12 (twelve) hours as needed (1 to 2 tablets for pain not relieved by acetaminophen). (Patient taking differently: Take 50 mg by mouth every 6 (six) hours as needed. ) 60 tablet 0 Past Month at Unknown time  . triamcinolone cream (KENALOG) 0.1 % Apply 1 application topically 2 (two) times daily as needed (eczema).    Past Month at Unknown time  . capsaicin (ZOSTRIX) 0.025 % cream Apply topically 2 (two) times daily. (Patient not taking: Reported on 06/03/2015) 60 g 0 Not Taking   Scheduled: . allopurinol  100 mg Oral QHS  . amLODipine  10 mg Oral Daily  . antiseptic oral rinse  7 mL Mouth Rinse BID  . aspirin EC  81 mg Oral Daily  . citalopram  20 mg Oral Daily  . Darbepoetin Alfa  100 mcg Subcutaneous Q14 Days  . ezetimibe  10 mg Oral Daily  . famotidine  20 mg Oral Daily  . furosemide  80 mg Intravenous BID  . heparin  5,000 Units Subcutaneous 3 times per day  . hydrALAZINE  100 mg Oral 3 times per  day  . insulin aspart  0-5 Units Subcutaneous QHS  . insulin aspart  0-9 Units Subcutaneous TID WC  . iron polysaccharides  150 mg Oral BID  . isosorbide mononitrate  120 mg Oral Daily  . loratadine  10 mg Oral Daily  . montelukast  10 mg Oral QHS  . timolol  1 drop Both Eyes BID   Continuous:   Assessment/Plan: 1. Chronic kidney disease stage 4- creatinine trending down  with increased lasix dose, good UOP.  - hold ultram - strict I/Os, daily weights - continue lasix IV 80mg  BID  - will follow chem panel - CBC w/ diff w/ 5 eosinophils, unlikely med induced - avoid nephrotoxins and renal dose meds 2. Acute CHF exacerbation -lasix as above 3 Hypertension- per primary 4. Anemia- stable, can consider repeat anemia panel and starting ESA this admission or as outpatient.  5. Bradycardia- cardiology following, stopped home coreg, not a candidate for further interventions   LOS: 5 days   Julious Oka 06/09/2015,10:34 AM

## 2015-06-09 NOTE — Progress Notes (Signed)
Subjective:  Not currently short of breath today.  No complaints of chest pain today.  Creatinine is coming down.  She complains today of severe left shoulder pain and a feeling as if a piece of iron is in her left arm.  Unable to raise her left arm. Objective:  Vital Signs in the last 24 hours: BP 129/44 mmHg  Pulse 57  Temp(Src) 98.1 F (36.7 C) (Oral)  Resp 18  Ht 5' (1.524 m)  Wt 89.7 kg (197 lb 12 oz)  BMI 38.62 kg/m2  SpO2 94%  Physical Exam: Elderly obese female lying in bed currently with no complaints Lungs: Reduced breath sounds  Cardiac:  Regular rhythm, normal S1 and S2, no S3 Abdomen:  Soft, nontender, no masses Extremities:  Right amputation noted.  Diminished pulse on left leg.  No edema present.  Left shoulder with significant crepitus and limited range of motion.  Intake/Output from previous day: 07/26 0701 - 07/27 0700 In: 580 [P.O.:580] Out: 900 [Urine:900]  Weight Filed Weights   06/05/15 2107 06/06/15 2145 06/08/15 2117  Weight: 84.763 kg (186 lb 13.9 oz) 83.12 kg (183 lb 3.9 oz) 89.7 kg (197 lb 12 oz)    Lab Results: Basic Metabolic Panel:  Recent Labs  06/08/15 0430 06/09/15 0515  NA 133* 136  K 4.4 4.5  CL 100* 103  CO2 23 23  GLUCOSE 107* 99  BUN 117* 113*  CREATININE 2.85* 2.59*   CBC:  Recent Labs  06/08/15 0430 06/09/15 0515  WBC 7.0 8.6  NEUTROABS  --  6.5  HGB 8.7* 8.8*  HCT 26.9* 27.3*  MCV 71.4* 71.5*  PLT 184 174   Telemetry: Sinus bradycardia which is improved  Assessment/Plan:  1.  Asymptomatic bradycardia with first-degree AV block 2.  Acute on chronic diastolic heart failure clinically better 3.  Acute on chronic kidney disease stage 4-5 but with improving creatinine 4.  Peripheral vascular disease with prior amputation 5.  Worsening anemia  Recommendations:  Bradycardia has now resolved with stopping beta blockers.  I would not restart them.  No significant chest pain.  Her major complaint is shoulder pain  and I discussed this with her hospitalist.  Continue diuretics per renal.  Echo has not been done yet.     Kerry Hough  MD [facility]  06/09/2015, 8:39 AM

## 2015-06-10 ENCOUNTER — Inpatient Hospital Stay (HOSPITAL_COMMUNITY): Payer: Medicare Other

## 2015-06-10 DIAGNOSIS — N184 Chronic kidney disease, stage 4 (severe): Secondary | ICD-10-CM

## 2015-06-10 DIAGNOSIS — R300 Dysuria: Secondary | ICD-10-CM

## 2015-06-10 DIAGNOSIS — J42 Unspecified chronic bronchitis: Secondary | ICD-10-CM

## 2015-06-10 LAB — BASIC METABOLIC PANEL
Anion gap: 13 (ref 5–15)
BUN: 114 mg/dL — AB (ref 6–20)
CHLORIDE: 104 mmol/L (ref 101–111)
CO2: 21 mmol/L — ABNORMAL LOW (ref 22–32)
Calcium: 9 mg/dL (ref 8.9–10.3)
Creatinine, Ser: 2.72 mg/dL — ABNORMAL HIGH (ref 0.44–1.00)
GFR calc Af Amer: 17 mL/min — ABNORMAL LOW (ref >60)
GFR calc non Af Amer: 15 mL/min — ABNORMAL LOW (ref >60)
Glucose, Bld: 107 mg/dL — ABNORMAL HIGH (ref 65–99)
Potassium: 4.5 mmol/L (ref 3.5–5.1)
Sodium: 138 mmol/L (ref 135–145)

## 2015-06-10 LAB — CBC
HCT: 26.4 % — ABNORMAL LOW (ref 36.0–46.0)
Hemoglobin: 8.4 g/dL — ABNORMAL LOW (ref 12.0–15.0)
MCH: 22.8 pg — ABNORMAL LOW (ref 26.0–34.0)
MCHC: 31.8 g/dL (ref 30.0–36.0)
MCV: 71.5 fL — AB (ref 78.0–100.0)
Platelets: 175 10*3/uL (ref 150–400)
RBC: 3.69 MIL/uL — ABNORMAL LOW (ref 3.87–5.11)
RDW: 17.7 % — ABNORMAL HIGH (ref 11.5–15.5)
WBC: 9.1 10*3/uL (ref 4.0–10.5)

## 2015-06-10 LAB — URINALYSIS, ROUTINE W REFLEX MICROSCOPIC
Bilirubin Urine: NEGATIVE
Glucose, UA: NEGATIVE mg/dL
Hgb urine dipstick: NEGATIVE
Ketones, ur: NEGATIVE mg/dL
Nitrite: NEGATIVE
PH: 5 (ref 5.0–8.0)
Protein, ur: NEGATIVE mg/dL
SPECIFIC GRAVITY, URINE: 1.009 (ref 1.005–1.030)
Urobilinogen, UA: 0.2 mg/dL (ref 0.0–1.0)

## 2015-06-10 LAB — GLUCOSE, CAPILLARY
GLUCOSE-CAPILLARY: 116 mg/dL — AB (ref 65–99)
Glucose-Capillary: 118 mg/dL — ABNORMAL HIGH (ref 65–99)
Glucose-Capillary: 99 mg/dL (ref 65–99)
Glucose-Capillary: 125 mg/dL — ABNORMAL HIGH (ref 65–99)

## 2015-06-10 LAB — URINE MICROSCOPIC-ADD ON

## 2015-06-10 NOTE — Care Management Important Message (Signed)
Important Message  Patient Details  Name: [name] MRN: [id number] Date of Birth: [birth year]   Medicare Important Message Given:  Yes-third notification given    Delorse Lek 06/10/2015, 11:03 AM

## 2015-06-10 NOTE — Clinical Social Work Note (Signed)
Patient from [facility] and will return there at discharge. CSW continuing to follow patient's progress and will assist with transfer back to ALF when medically stable.   Dwan Fennel Givens, MSW, LCSW Licensed Clinical Social Worker [place] [phone]

## 2015-06-10 NOTE — Progress Notes (Signed)
Per request from Dr. Wynonia Lawman obtained temperature; current temp 97.7 degrees Fahrenheit orally. Temp documented in pt's flow sheet.

## 2015-06-10 NOTE — Evaluation (Signed)
Physical Therapy Evaluation Patient Details Name: [name] MRN: [id number] DOB: 10-27-1927 Today's Date: 06/10/2015   History of Present Illness  Patient is an 81 yo female admitted 06/04/15 with acute resp failure with hypoxia, cough and chest tightness.  PMH:  PVD, Rt AKA, CHF, CAD, COPD, HTN, DM  Clinical Impression  Patient presents with problems listed below.  Will benefit from acute PT to maximize functional mobility prior to return to ALF.  Recommend patient receive continued therapy at ALF (?HHPT).    Follow Up Recommendations Home health PT;Supervision/Assistance - 24 hour (HHPT in ALF setting)    Equipment Recommendations  None recommended by PT    Recommendations for Other Services       Precautions / Restrictions Precautions Precautions: Fall Restrictions Weight Bearing Restrictions: No      Mobility  Bed Mobility Overal bed mobility: Needs Assistance Bed Mobility: Supine to Sit;Sit to Supine     Supine to sit: Mod assist Sit to supine: Min guard   General bed mobility comments: Assist to raise trunk to upright position.  Once sitting, patient with good balance.  Able to scoot to edge of bed with min guard assist.  Patient sat EOB x 6 minutes.  Patient able to move to supine with no physical assist.  Transfers                    Ambulation/Gait                Stairs            Wheelchair Mobility    Modified Rankin (Stroke Patients Only)       Balance Overall balance assessment: Needs assistance Sitting-balance support: Single extremity supported Sitting balance-Leahy Scale: Fair                                       Pertinent Vitals/Pain Pain Assessment: No/denies pain    Home Living Family/patient expects to be discharged to:: Assisted living (Patient from [facility])               Home Equipment: Transport planner      Prior Function Level of Independence: Needs assistance   Gait /  Transfers Assistance Needed: Patient reports she requires +1 or +2 to transfer into electric w/c / scooter, "depending on how I feel that day".  Non-ambulatory  ADL's / Homemaking Assistance Needed: Requires assist to bathe back, to dress, for meal prep.        Hand Dominance        Extremity/Trunk Assessment   Upper Extremity Assessment: Generalized weakness           Lower Extremity Assessment: RLE deficits/detail;LLE deficits/detail RLE Deficits / Details: Rt AKA LLE Deficits / Details: Strength grossly 3+/5;  dressings noted on heel     Communication   Communication: No difficulties  Cognition Arousal/Alertness: Awake/alert Behavior During Therapy: WFL for tasks assessed/performed Overall Cognitive Status: Within Functional Limits for tasks assessed                      General Comments General comments (skin integrity, edema, etc.): Noted bandages on Lt heel    Exercises        Assessment/Plan    PT Assessment Patient needs continued PT services  PT Diagnosis Generalized weakness   PT Problem List Decreased strength;Decreased activity tolerance;Decreased balance;Decreased  mobility;Cardiopulmonary status limiting activity  PT Treatment Interventions DME instruction;Functional mobility training;Therapeutic activities;Balance training;Patient/family education   PT Goals (Current goals can be found in the Care Plan section) Acute Rehab PT Goals Patient Stated Goal: None stated PT Goal Formulation: With patient Time For Goal Achievement: 06/17/15 Potential to Achieve Goals: Good    Frequency Min 2X/week   Barriers to discharge        Co-evaluation               End of Session Equipment Utilized During Treatment: Oxygen Activity Tolerance: Patient limited by fatigue Patient left: in bed;with call bell/phone within reach;with bed alarm set Nurse Communication: Mobility status;Need for lift equipment         Time: 5809-9833 PT Time  Calculation (min) (ACUTE ONLY): 12 min   Charges:   PT Evaluation $Initial PT Evaluation Tier I: 1 Procedure     PT G CodesDespina [name] 06/15/2015, 6:35 PM [name]. [name], [name] [name] [phone]

## 2015-06-10 NOTE — Progress Notes (Addendum)
[facility]                                                                              Patient Demographics  [name], is a 81 y.o. female, DOB - [birth year], RCV:[id number]  Admit date - 06/04/2015   Admitting Physician Ripudeep Krystal Eaton, MD  Outpatient Primary MD for the patient is Simona Huh, MD  LOS - 6   Chief Complaint  Patient presents with  . Chest Pain      HPI on 06/04/2015 by Ms. Erin Hearing, NP This is a 32 showed female patient with known peripheral vascular disease status post right AKA for nonhealing lower extremity ulcer in 2015. Because of profound deconditioning and issues with mobility patient resides at assisted living facility. Other past medical history includes diet-controlled diabetes, COPD, hypertension, grade 1 diastolic heart failure, chronic kidney disease stage IV followed by Dr. Justin Mend and breast cancer. She presented to the ER today with reports of chest pain that began around 7:30 today. By the time she had arrived to the ER the chest pain had resolved and patient was primarily complaining of a cough which is chronic. It was reported that when they arrived to the facility patient was maintaining O2 saturations of 84% on room air. Because of report of chest pain MS also give the patient aspirin. After arrival to this facility patient did have productive sputum that was blood streaked in appearance. Patient had a rectal temperature of 100.4 in the ER, BP was 12 5/53, pulse was 61, O2 saturations were 96% on 4 L oxygen-she has been subsequently weaned down to 2 L oxygen. Chest x-ray was read as central vascular congestion with mild interstitial prominence bilaterally highly suspicious for pulmonary edema. On review of chest films there was a lacy type pattern that appears more consistent with viral pneumonitis. After arrival patient also began complaining of issues related to dysuria. Toward data revealed leukocytosis with a white count of 11,700 and a  left shift with neutrophils 85%. Lactic acid was normal at 1.36, BNP was 308. Patient appeared to be in acute renal failure with a BUN of 106 noting one year ago BUN was 75, and creatinine 2.78 with creatinine only slightly higher than baseline 1 year ago. GFR 17, glucose 146, CO2 slightly decreased to 21. Patient later clarified her chest pain as being left lateral neck pain extending to the chest that was worsened when she turned her head.  Assessment & Plan   Acute respiratory failure with hypoxia -likely secondary to acute diastolic CHF rather than PNA -Patient currently afebrile, no leukocytosis -Lactic acid and procalcitonin level WNL as well and given recent C. Diff, will avoid ABX as possible -Continue Lasix and follow up on cardiology/nephrology recommendations regarding the dosing  -Continue supplemental oxygen to maintain sats above 92%. -Patient feels she cannot catch her breath.  Repeated Xray: CHF with pulmonary interstitial and alveolar edema  Acute on chronic diastolic CHF -Echocardiogram in April 1025: grade I diastolic dysfunction -Repeat echocardiogram shows EF 85-27%, grade 3 diastolic dysfunction -continue Lasix as noted above but increase the dose from 20 mg to 80 mg BID IV per nephrologist  -Doubt daily weights, intake  and output are being monitored appropriately -Urine output over the past 24 hours 2000cc -Cardiology consultation appreciated, no further ischemic workup needed at this point.  Bradycardia -better since Coreg stopped  -appreciate cardiology following, no recommendation for further ischemic work up   Fever -Tmax 99.9 F 7/23, resolved  -blood cultures with coag neg staph, likely contaminant so we have stopped vanc 7/25  S/P AKA (above knee amputation), right  -stable   Diet-controlled diabetes mellitus with complications of CKD and PVD -continue insulin sliding scale CBG monitoring  CKD (chronic kidney disease), stage IV -close monitoring while  on Lasix IV, dose increased to 80 mg BID per nephrologist  -Creatinine currently 2.72 (GFR remains in Stage 4) -Continue to monitor BMP  Acute thrombocytopenia -close monitoring, Plt remain stable, currently 175 -Continue to monitor CBC  HTN (hypertension) -continue home medical regimen  but hold coreg due to bradycardia   Morbid obesity with BMI > 35 and underlying risk factors of HTN, CKD -Body mass index is 35.79 kg/(m^2).  Left shoulder pain and crepitus -Per patient this has been ongoing for over a year. Patient received injections in her right shoulder. Spoke with granddaughter, this has also been ongoing issue. -Continue pain control  Mildly Lethargic -Will rule out infection -Patient currently afebrile, no leukocytosis  -CXR showed no infection -Will obtain repeat UA and blood cultures  Code Status: DNR  Family Communication: None at bedside.  Granddaughter via phone  Disposition Plan: Admitted.  Continue IV lasix and monitoring.   Time Spent in minutes   30 minutes  Procedures  Echcoardiogram  Consults   Nephrology Ccardiology  DVT Prophylaxis  Heparin  Lab Results  Component Value Date   PLT 175 06/10/2015    Medications  Scheduled Meds: . allopurinol  100 mg Oral QHS  . amLODipine  10 mg Oral Daily  . antiseptic oral rinse  7 mL Mouth Rinse BID  . aspirin EC  81 mg Oral Daily  . citalopram  20 mg Oral Daily  . Darbepoetin Alfa  100 mcg Subcutaneous Q14 Days  . ezetimibe  10 mg Oral Daily  . famotidine  20 mg Oral Daily  . furosemide  80 mg Intravenous BID  . heparin  5,000 Units Subcutaneous 3 times per day  . hydrALAZINE  100 mg Oral 3 times per day  . insulin aspart  0-5 Units Subcutaneous QHS  . insulin aspart  0-9 Units Subcutaneous TID WC  . iron polysaccharides  150 mg Oral BID  . isosorbide mononitrate  120 mg Oral Daily  . loratadine  10 mg Oral Daily  . montelukast  10 mg Oral QHS  . timolol  1 drop Both Eyes BID   Continuous  Infusions:  PRN Meds:.MUSCLE RUB, polyethylene glycol, traMADol  Antibiotics    Anti-infectives    Start     Dose/Rate Route Frequency Ordered Stop   06/06/15 1200  vancomycin (VANCOCIN) IVPB 1000 mg/200 mL premix  Status:  Discontinued     1,000 mg 200 mL/hr over 60 Minutes Intravenous Every 48 hours 06/04/15 1250 06/05/15 1701   06/06/15 1200  vancomycin (VANCOCIN) IVPB 1000 mg/200 mL premix  Status:  Discontinued     1,000 mg 200 mL/hr over 60 Minutes Intravenous Every 48 hours 06/06/15 0825 06/07/15 1435   06/05/15 1200  cefTAZidime (FORTAZ) 1 g in dextrose 5 % 50 mL IVPB  Status:  Discontinued     1 g 100 mL/hr over 30 Minutes Intravenous Every 24 hours 06/04/15  1250 06/05/15 1701   06/04/15 1400  cefTAZidime (FORTAZ) 2 g in dextrose 5 % 50 mL IVPB  Status:  Discontinued     2 g 100 mL/hr over 30 Minutes Intravenous 3 times per day 06/04/15 0951 06/04/15 0957   06/04/15 1000  vancomycin (VANCOCIN) IVPB 1000 mg/200 mL premix     1,000 mg 200 mL/hr over 60 Minutes Intravenous  Once 06/04/15 0951 06/04/15 1409   06/04/15 1000  cefTAZidime (FORTAZ) 2 g in dextrose 5 % 50 mL IVPB     2 g 100 mL/hr over 30 Minutes Intravenous  Once 06/04/15 8295 06/04/15 1227      Subjective:   [name] seen and examined today.  Patient complains of feeling cold and not well. Complains of cough. Denies chest pain, abdominal pain, nausea, vomiting, diarrhea, constipation, dizziness or headache.  Objective:   Filed Vitals:   06/09/15 2006 06/10/15 0355 06/10/15 0810 06/10/15 0907  BP: 123/41 135/49 152/49   Pulse: 59 67 70   Temp: 98.2 F (36.8 C) 98.2 F (36.8 C) 98.6 F (37 C) 97.7 F (36.5 C)  TempSrc: Oral Oral Oral Oral  Resp: 17 18 22    Height:      Weight: 95.5 kg (210 lb 8.6 oz)     SpO2: 100% 99% 96%     Wt Readings from Last 3 Encounters:  06/09/15 95.5 kg (210 lb 8.6 oz)  06/03/15 81.647 kg (180 lb)  05/18/14 83.915 kg (185 lb)     Intake/Output Summary (Last 24  hours) at 06/10/15 1050 Last data filed at 06/10/15 6213  Gross per 24 hour  Intake    600 ml  Output   2000 ml  Net  -1400 ml    Exam  General: Well developed, well nourished  HEENT: NCAT,mucous membranes moist.   Cardiovascular: S1 S2 auscultated, RRR, no murmurs appreciated  Respiratory: Diminished breath sounds anteriorly, occasional cough.  Abdomen: Soft, obese, nontender, nondistended, + bowel sounds  Extremities: RAKA, No LLE edema.     Data Review   Micro Results Recent Results (from the past 240 hour(s))  Blood Culture (routine x 2)     Status: None   Collection Time: 06/04/15 10:50 AM  Result Value Ref Range Status   Specimen Description BLOOD RIGHT HAND  Final   Special Requests BOTTLES DRAWN AEROBIC AND ANAEROBIC 5CC  Final   Culture NO GROWTH 5 DAYS  Final   Report Status 06/09/2015 FINAL  Final  Blood Culture (routine x 2)     Status: None (Preliminary result)   Collection Time: 06/04/15 11:00 AM  Result Value Ref Range Status   Specimen Description BLOOD LEFT HAND  Final   Special Requests BOTTLES DRAWN AEROBIC AND ANAEROBIC 5CC  Final   Culture  Setup Time   Final    GRAM POSITIVE COCCI IN CLUSTERS AEROBIC BOTTLE ONLY CRITICAL RESULT CALLED TO, READ BACK BY AND VERIFIED WITH: BRAKE,A RN 06/05/15 1915 WOOTEN,K CONFIRMED BY B MARTIN    Culture   Final    STAPHYLOCOCCUS SPECIES (COAGULASE NEGATIVE) THE SIGNIFICANCE OF ISOLATING THIS ORGANISM FROM A SINGLE SET OF BLOOD CULTURES WHEN MULTIPLE SETS ARE DRAWN IS UNCERTAIN. PLEASE NOTIFY THE MICROBIOLOGY DEPARTMENT WITHIN ONE WEEK IF SPECIATION AND SENSITIVITIES ARE REQUIRED.    Report Status PENDING  Incomplete  Urine culture     Status: None   Collection Time: 06/04/15  1:26 PM  Result Value Ref Range Status   Specimen Description URINE, CATHETERIZED  Final  Special Requests NONE  Final   Culture NO GROWTH 1 DAY  Final   Report Status 06/05/2015 FINAL  Final  Culture, sputum-assessment     Status:  None   Collection Time: 06/05/15  6:44 PM  Result Value Ref Range Status   Specimen Description SPUTUM  Final   Special Requests NONE  Final   Sputum evaluation   Final    THIS SPECIMEN IS ACCEPTABLE. RESPIRATORY CULTURE REPORT TO FOLLOW.   Report Status 06/05/2015 FINAL  Final  Culture, respiratory (NON-Expectorated)     Status: None   Collection Time: 06/05/15  6:44 PM  Result Value Ref Range Status   Specimen Description SPUTUM  Final   Special Requests NONE  Final   Gram Stain   Final    FEW WBC PRESENT, PREDOMINANTLY MONONUCLEAR ABUNDANT SQUAMOUS EPITHELIAL CELLS PRESENT MODERATE GRAM POSITIVE COCCI IN CHAINS IN PAIRS FEW GRAM NEGATIVE RODS Performed at [facility]    Culture   Final    NORMAL OROPHARYNGEAL FLORA Performed at [facility]    Report Status 06/08/2015 FINAL  Final    Radiology Reports Dg Chest 2 View  06/04/2015   CLINICAL DATA:  Shortness of breath starting last night  EXAM: CHEST  2 VIEW  COMPARISON:  04/28/2014  FINDINGS: Cardiomediastinal silhouette is stable. Status post CABG. Degenerative changes bilateral shoulders. Surgical clips are noted in right axilla. There is central vascular congestion and mild interstitial prominence bilaterally highly suspicious for pulmonary edema. Mild basilar atelectasis without segmental infiltrates. Stable degenerative changes thoracic spine.  IMPRESSION: Central vascular congestion mild interstitial prominence bilaterally highly suspicious for pulmonary edema. No segmental infiltrates. Status post CABG. Degenerative changes bilateral shoulders.   Electronically Signed   By: Lahoma Crocker M.D.   On: 06/04/2015 10:20   Dg Chest Port 1 View  06/10/2015   CLINICAL DATA:  Cough, CHF, history of CABG.  EXAM: PORTABLE CHEST - 1 VIEW  COMPARISON:  Portable chest x-ray of June 04, 2015  FINDINGS: The lungs are less well inflated today. The pulmonary interstitial markings remain increased and there are areas of confluent  alveolar opacity bilaterally. The costophrenic angles are obscured. The cardiac silhouette is enlarged. The pulmonary vascularity is engorged and there is mild cephalization. There are post CABG changes.  IMPRESSION: CHF with pulmonary interstitial and alveolar edema. There has been deterioration in the appearance of the pulmonary interstitium since the previous study.   Electronically Signed   By: David  Martinique M.D.   On: 06/10/2015 10:04    CBC  Recent Labs Lab 06/04/15 0911  06/06/15 0541 06/07/15 0614 06/08/15 0430 06/09/15 0515 06/10/15 0526  WBC 11.7*  < > 7.0 7.0 7.0 8.6 9.1  HGB 10.2*  < > 8.3* 8.6* 8.7* 8.8* 8.4*  HCT 31.5*  < > 25.5* 26.4* 26.9* 27.3* 26.4*  PLT 205  < > 147* 166 184 174 175  MCV 71.1*  < > 71.2* 70.8* 71.4* 71.5* 71.5*  MCH 23.0*  < > 23.2* 23.1* 23.1* 23.0* 22.8*  MCHC 32.4  < > 32.5 32.6 32.3 32.2 31.8  RDW 17.5*  < > 17.4* 17.6* 17.6* 17.5* 17.7*  LYMPHSABS 1.2  --   --   --   --  0.9  --   MONOABS 0.5  --   --   --   --  0.7  --   EOSABS 0.2  --   --   --   --  0.4  --  BASOSABS 0.0  --   --   --   --  0.0  --   < > = values in this interval not displayed.  Chemistries   Recent Labs Lab 06/04/15 0911  06/06/15 0541 06/07/15 0614 06/08/15 0430 06/09/15 0515 06/10/15 0526  NA 140  < > 134* 132* 133* 136 138  K 4.5  < > 4.4 4.3 4.4 4.5 4.5  CL 106  < > 101 100* 100* 103 104  CO2 21*  < > 22 22 23 23  21*  GLUCOSE 146*  < > 87 92 107* 99 107*  BUN 106*  < > 113* 112* 117* 113* 114*  CREATININE 2.78*  < > 2.94* 2.91* 2.85* 2.59* 2.72*  CALCIUM 8.8*  < > 8.2* 8.4* 8.3* 8.8* 9.0  AST 24  --   --   --   --   --   --   ALT 8*  --   --   --   --   --   --   ALKPHOS 80  --   --   --   --   --   --   BILITOT 0.4  --   --   --   --   --   --   < > = values in this interval not displayed. ------------------------------------------------------------------------------------------------------------------ estimated creatinine clearance is 15.4 mL/min  (by C-G formula based on Cr of 2.72). ------------------------------------------------------------------------------------------------------------------ No results for input(s): HGBA1C in the last 72 hours. ------------------------------------------------------------------------------------------------------------------ No results for input(s): CHOL, HDL, LDLCALC, TRIG, CHOLHDL, LDLDIRECT in the last 72 hours. ------------------------------------------------------------------------------------------------------------------ No results for input(s): TSH, T4TOTAL, T3FREE, THYROIDAB in the last 72 hours.  Invalid input(s): FREET3 ------------------------------------------------------------------------------------------------------------------ No results for input(s): VITAMINB12, FOLATE, FERRITIN, TIBC, IRON, RETICCTPCT in the last 72 hours.  Coagulation profile No results for input(s): INR, PROTIME in the last 168 hours.  No results for input(s): DDIMER in the last 72 hours.  Cardiac Enzymes No results for input(s): CKMB, TROPONINI, MYOGLOBIN in the last 168 hours.  Invalid input(s): CK ------------------------------------------------------------------------------------------------------------------ Invalid input(s): POCBNP    Devera Englander D.O. on 06/10/2015 at 10:50 AM  Between 7am to 7pm - Pager - [phone]  After 7pm go to [email or web address] - password [email or web address]  And look for the night coverage person covering for me after hours  [facility] Office  [phone]

## 2015-06-10 NOTE — Progress Notes (Signed)
SUBJECTIVE:  No acute  Events noted  OBJECTIVE:   Vitals:   Filed Vitals:   06/10/15 0355 06/10/15 0810 06/10/15 0907 06/10/15 1213  BP: 135/49 152/49    Pulse: 67 70    Temp: 98.2 F (36.8 C) 98.6 F (37 C) 97.7 F (36.5 C)   TempSrc: Oral Oral Oral   Resp: 18 22    Height:      Weight:    185 lb 9.6 oz (84.188 kg)  SpO2: 99% 96%     I&O's:   Intake/Output Summary (Last 24 hours) at 06/10/15 1247 Last data filed at 06/10/15 4098  Gross per 24 hour  Intake    600 ml  Output   2000 ml  Net  -1400 ml        PHYSICAL EXAM General: Well developed, well nourished, in no acute distress Head:   Normal cephalic and atramatic  Lungs:  No wheezing. Heart:  RRR .     Neuro: sleepy Psych:  sleepy    LABS: Basic Metabolic Panel:  Recent Labs  06/09/15 0515 06/10/15 0526  NA 136 138  K 4.5 4.5  CL 103 104  CO2 23 21*  GLUCOSE 99 107*  BUN 113* 114*  CREATININE 2.59* 2.72*  CALCIUM 8.8* 9.0   Liver Function Tests: No results for input(s): AST, ALT, ALKPHOS, BILITOT, PROT, ALBUMIN in the last 72 hours. No results for input(s): LIPASE, AMYLASE in the last 72 hours. CBC:  Recent Labs  06/09/15 0515 06/10/15 0526  WBC 8.6 9.1  NEUTROABS 6.5  --   HGB 8.8* 8.4*  HCT 27.3* 26.4*  MCV 71.5* 71.5*  PLT 174 175   Cardiac Enzymes: No results for input(s): CKTOTAL, CKMB, CKMBINDEX, TROPONINI in the last 72 hours. BNP: Invalid input(s): POCBNP D-Dimer: No results for input(s): DDIMER in the last 72 hours. Hemoglobin A1C: No results for input(s): HGBA1C in the last 72 hours. Fasting Lipid Panel: No results for input(s): CHOL, HDL, LDLCALC, TRIG, CHOLHDL, LDLDIRECT in the last 72 hours. Thyroid Function Tests: No results for input(s): TSH, T4TOTAL, T3FREE, THYROIDAB in the last 72 hours.  Invalid input(s): FREET3 Anemia Panel: No results for input(s): VITAMINB12, FOLATE, FERRITIN, TIBC, IRON, RETICCTPCT in the last 72 hours. Coag Panel:   Lab Results    Component Value Date   INR 1.10 06/05/2011   INR 1.10 04/17/2011    RADIOLOGY: Dg Chest 2 View  06/04/2015   CLINICAL DATA:  Shortness of breath starting last night  EXAM: CHEST  2 VIEW  COMPARISON:  04/28/2014  FINDINGS: Cardiomediastinal silhouette is stable. Status post CABG. Degenerative changes bilateral shoulders. Surgical clips are noted in right axilla. There is central vascular congestion and mild interstitial prominence bilaterally highly suspicious for pulmonary edema. Mild basilar atelectasis without segmental infiltrates. Stable degenerative changes thoracic spine.  IMPRESSION: Central vascular congestion mild interstitial prominence bilaterally highly suspicious for pulmonary edema. No segmental infiltrates. Status post CABG. Degenerative changes bilateral shoulders.   Electronically Signed   By: Lahoma Crocker M.D.   On: 06/04/2015 10:20   Dg Chest Port 1 View  06/10/2015   CLINICAL DATA:  Cough, CHF, history of CABG.  EXAM: PORTABLE CHEST - 1 VIEW  COMPARISON:  Portable chest x-ray of June 04, 2015  FINDINGS: The lungs are less well inflated today. The pulmonary interstitial markings remain increased and there are areas of confluent alveolar opacity bilaterally. The costophrenic angles are obscured. The cardiac silhouette is enlarged. The pulmonary vascularity is engorged  and there is mild cephalization. There are post CABG changes.  IMPRESSION: CHF with pulmonary interstitial and alveolar edema. There has been deterioration in the appearance of the pulmonary interstitium since the previous study.   Electronically Signed   By: David  Martinique M.D.   On: 06/10/2015 10:04      ASSESSMENT: Kathyrn Lass:    Chronic diastolic heart failure: stable. Appears euvolemic. Able to lie gflat and breath comfortably.   [place], MD  06/10/2015  12:47 PM

## 2015-06-10 NOTE — Progress Notes (Signed)
Subjective:  She is lying in bed this morning complaining of feeling hot and is chilled.  Complains of a smothering sensation.  Objective:  Vital Signs in the last 24 hours: BP 152/49 mmHg  Pulse 70  Temp(Src) 98.6 F (37 C) (Oral)  Resp 22  Ht 5' (1.524 m)  Wt 95.5 kg (210 lb 8.6 oz)  BMI 41.12 kg/m2  SpO2 96%  Physical Exam: Elderly obese female lying in bed currently with blankets and covers over her, she feels hot  Lungs: Reduced breath sounds  Cardiac:  Regular rhythm, normal S1 and S2, no S3 Abdomen:  Soft, nontender, no masses Extremities:  Right amputation noted.  Diminished pulse on left leg.  No edema present.   Intake/Output from previous day: 07/27 0701 - 07/28 0700 In: 840 [P.O.:840] Out: 2000 [Urine:2000]  Weight Filed Weights   06/06/15 2145 06/08/15 2117 06/09/15 2006  Weight: 83.12 kg (183 lb 3.9 oz) 89.7 kg (197 lb 12 oz) 95.5 kg (210 lb 8.6 oz)    Lab Results: Basic Metabolic Panel:  Recent Labs  06/09/15 0515 06/10/15 0526  NA 136 138  K 4.5 4.5  CL 103 104  CO2 23 21*  GLUCOSE 99 107*  BUN 113* 114*  CREATININE 2.59* 2.72*   CBC:  Recent Labs  06/09/15 0515 06/10/15 0526  WBC 8.6 9.1  NEUTROABS 6.5  --   HGB 8.8* 8.4*  HCT 27.3* 26.4*  MCV 71.5* 71.5*  PLT 174 175   Telemetry: Sinus bradycardia which is improved  ECHO Her EF is 55-60%.  She has moderate LVH and evidence of grade 3 diastolic dysfunction.  She has moderate TR with moderate pulmonary hypertension.  Assessment/Plan:  1.  Asymptomatic bradycardia with first-degree AV block-resolved 2.  Acute on chronic diastolic heart failure clinically better 3.  Acute on chronic kidney disease stage 4-5 creatinine is somewhat higher today  4.  Peripheral vascular disease with prior amputation 5.  Worsening anemia 6.  Current complaints of feeling hot-she may be infected or be having decompensation of her respiratory status  Recommendations:  Echo shows preserved systolic  function.  She looks uncomfortable right now and may be having worsening of respiratory status or could be septic again.  We'll ask the nurse take her temperature.     Kerry Hough  MD [facility]  06/10/2015, 9:00 AM

## 2015-06-10 NOTE — Progress Notes (Signed)
Subjective: Interval History:resting comfortably, states breathing has improved.   Objective: Vital signs in last 24 hours: Temp:  [97.4 F (36.3 C)-98.6 F (37 C)] 97.7 F (36.5 C) (07/28 0907) Pulse Rate:  [59-70] 70 (07/28 0810) Resp:  [17-22] 22 (07/28 0810) BP: (121-152)/(41-72) 152/49 mmHg (07/28 0810) SpO2:  [95 %-100 %] 96 % (07/28 0810) Weight:  [185 lb 9.6 oz (84.188 kg)-210 lb 8.6 oz (95.5 kg)] 185 lb 9.6 oz (84.188 kg) (07/28 1213) Weight change: 12 lb 12.6 oz (5.8 kg)  Intake/Output from previous day: 07/27 0701 - 07/28 0700 In: 840 [P.O.:840] Out: 2000 [Urine:2000] Intake/Output this shift:    Physical Exam  Constitutional: She appears well-developed and well-nourished. No distress.  Eyes: Conjunctivae are normal.  Cardiovascular: Normal rate and regular rhythm.   No murmur heard. Pulmonary/Chest: Effort normal. No respiratory distress. She has no wheezes. She has rales (right basilar crackles).  Abdominal: Soft. Bowel sounds are normal. She exhibits no distension.  Musculoskeletal: She exhibits no edema.  Right AKA   Neurological: She is alert.  Skin: Skin is warm and dry.     Lab Results:  Recent Labs  06/09/15 0515 06/10/15 0526  WBC 8.6 9.1  HGB 8.8* 8.4*  HCT 27.3* 26.4*  PLT 174 175   BMET:   Recent Labs  06/09/15 0515 06/10/15 0526  NA 136 138  K 4.5 4.5  CL 103 104  CO2 23 21*  GLUCOSE 99 107*  BUN 113* 114*  CREATININE 2.59* 2.72*  CALCIUM 8.8* 9.0   No results for input(s): PTH in the last 72 hours. Iron Studies: No results for input(s): IRON, TIBC, TRANSFERRIN, FERRITIN in the last 72 hours. CBG (last 3)   Recent Labs  06/09/15 2003 06/10/15 0803 06/10/15 1144  GLUCAP 107* 118* 99     Studies/Results: Dg Chest Port 1 View  06/10/2015   CLINICAL DATA:  Cough, CHF, history of CABG.  EXAM: PORTABLE CHEST - 1 VIEW  COMPARISON:  Portable chest x-ray of June 04, 2015  FINDINGS: The lungs are less well inflated today.  The pulmonary interstitial markings remain increased and there are areas of confluent alveolar opacity bilaterally. The costophrenic angles are obscured. The cardiac silhouette is enlarged. The pulmonary vascularity is engorged and there is mild cephalization. There are post CABG changes.  IMPRESSION: CHF with pulmonary interstitial and alveolar edema. There has been deterioration in the appearance of the pulmonary interstitium since the previous study.   Electronically Signed   By: David  Martinique M.D.   On: 06/10/2015 10:04    Prior to Admission:  Prescriptions prior to admission  Medication Sig Dispense Refill Last Dose  . acetaminophen (TYLENOL) 500 MG tablet Take 500 mg by mouth every 6 (six) hours as needed for mild pain.   Past Month at Unknown time  . allopurinol (ZYLOPRIM) 100 MG tablet Take 1 tablet (100 mg total) by mouth at bedtime. 30 tablet 1 06/03/2015 at Unknown time  . amLODipine (NORVASC) 10 MG tablet Take 1 tablet (10 mg total) by mouth daily. 30 tablet 1 06/03/2015 at Unknown time  . aspirin EC 81 MG tablet Take 81 mg by mouth daily.   06/03/2015 at Unknown time  . B Complex-C-Folic Acid (VOL-CARE RX PO) Take 1 tablet by mouth daily. Nephro-Vite 0.8 mg qd   06/03/2015 at Unknown time  . carvedilol (COREG) 3.125 MG tablet Take 1 tablet (3.125 mg total) by mouth 2 (two) times daily with a meal. 60 tablet 1 06/03/2015 at  1600  . citalopram (CELEXA) 10 MG tablet Take 1 tablet (10 mg total) by mouth daily. (Patient taking differently: Take 20 mg by mouth daily. ) 30 tablet 1 06/03/2015 at Unknown time  . cloNIDine (CATAPRES) 0.1 MG tablet Take 0.1 mg by mouth once as needed (if sbp >180 and dbp >110).    Past Month at Unknown time  . clotrimazole (LOTRIMIN) 1 % external solution Apply 1 application topically 2 (two) times daily as needed (fungal rash on foot).   Past Month at Unknown time  . COLCRYS 0.6 MG tablet Take 0.6 mg by mouth daily as needed (acute gout flare).    unk at unk  .  ezetimibe (ZETIA) 10 MG tablet Take 1 tablet (10 mg total) by mouth daily. 30 tablet 1 06/03/2015 at Unknown time  . famotidine (PEPCID) 20 MG tablet Take 1 tablet (20 mg total) by mouth daily. 30 tablet 1 06/03/2015 at Unknown time  . furosemide (LASIX) 40 MG tablet Take 1.5 tablets (60 mg total) by mouth 2 (two) times daily. 90 tablet 1 06/03/2015 at Unknown time  . Glucosamine 500 MG CAPS Take 1,500 mg by mouth 3 (three) times daily.   06/03/2015 at Unknown time  . hydrALAZINE (APRESOLINE) 100 MG tablet Take 1 tablet (100 mg total) by mouth 3 (three) times daily. 90 tablet 1 06/03/2015 at Unknown time  . iron polysaccharides (NIFEREX) 150 MG capsule Take 1 capsule (150 mg total) by mouth 2 (two) times daily. 60 capsule 1 06/03/2015 at Unknown time  . isosorbide mononitrate (IMDUR) 120 MG 24 hr tablet Take 1 tablet (120 mg total) by mouth daily. 30 tablet 1 06/03/2015 at Unknown time  . ketoconazole (NIZORAL) 2 % cream Apply 1 application topically daily as needed for irritation.    unk at unk  . loratadine (CLARITIN) 10 MG tablet Take 1 tablet (10 mg total) by mouth daily. 30 tablet 1 06/03/2015 at Unknown time  . montelukast (SINGULAIR) 10 MG tablet Take 1 tablet (10 mg total) by mouth at bedtime. 30 tablet 1 06/03/2015 at Unknown time  . polyethylene glycol (MIRALAX / GLYCOLAX) packet Take 17 g by mouth daily as needed for mild constipation.   unk at [employer]  . PROCRIT 86761 UNIT/ML injection Inject 20,000 Units into the skin every 14 (fourteen) days.   Past Month at Unknown time  . sennosides-docusate sodium (SENOKOT-S) 8.6-50 MG tablet Take 1 tablet by mouth daily as needed for constipation.   unk at unk  . timolol (BETIMOL) 0.5 % ophthalmic solution Place 1 drop into both eyes 2 (two) times daily.   06/03/2015 at Unknown time  . traMADol (ULTRAM) 50 MG tablet Take 1 tablet (50 mg total) by mouth every 12 (twelve) hours as needed (1 to 2 tablets for pain not relieved by acetaminophen). (Patient taking  differently: Take 50 mg by mouth every 6 (six) hours as needed. ) 60 tablet 0 Past Month at Unknown time  . triamcinolone cream (KENALOG) 0.1 % Apply 1 application topically 2 (two) times daily as needed (eczema).    Past Month at Unknown time  . capsaicin (ZOSTRIX) 0.025 % cream Apply topically 2 (two) times daily. (Patient not taking: Reported on 06/03/2015) 60 g 0 Not Taking   Scheduled: . allopurinol  100 mg Oral QHS  . amLODipine  10 mg Oral Daily  . antiseptic oral rinse  7 mL Mouth Rinse BID  . aspirin EC  81 mg Oral Daily  . citalopram  20 mg  Oral Daily  . Darbepoetin Alfa  100 mcg Subcutaneous Q14 Days  . ezetimibe  10 mg Oral Daily  . famotidine  20 mg Oral Daily  . furosemide  80 mg Intravenous BID  . heparin  5,000 Units Subcutaneous 3 times per day  . hydrALAZINE  100 mg Oral 3 times per day  . insulin aspart  0-5 Units Subcutaneous QHS  . insulin aspart  0-9 Units Subcutaneous TID WC  . iron polysaccharides  150 mg Oral BID  . isosorbide mononitrate  120 mg Oral Daily  . loratadine  10 mg Oral Daily  . montelukast  10 mg Oral QHS  . timolol  1 drop Both Eyes BID   Continuous:   Assessment/Plan: 1. Chronic kidney disease stage 4-  Slight increase in creatinine this am, good diuresis w/ lasix IV 80mg  BID and improvement in breathing.  - continue lasix IV 80mg  BID  - nephrology will sign off.  2. Acute CHF exacerbation - ECHO w/ preserved EF, lasix as above 3 Hypertension- per primary 4. Anemia- stable 5. Bradycardia- cardiology following, stopped home coreg, not a candidate for further interventions   LOS: 6 days   Julious Oka 06/10/2015,2:00 PM

## 2015-06-11 LAB — BASIC METABOLIC PANEL
Anion gap: 9 (ref 5–15)
BUN: 107 mg/dL — ABNORMAL HIGH (ref 6–20)
CALCIUM: 8.9 mg/dL (ref 8.9–10.3)
CO2: 26 mmol/L (ref 22–32)
CREATININE: 2.7 mg/dL — AB (ref 0.44–1.00)
Chloride: 102 mmol/L (ref 101–111)
GFR calc Af Amer: 17 mL/min — ABNORMAL LOW (ref >60)
GFR calc non Af Amer: 15 mL/min — ABNORMAL LOW (ref >60)
Glucose, Bld: 100 mg/dL — ABNORMAL HIGH (ref 65–99)
POTASSIUM: 4.5 mmol/L (ref 3.5–5.1)
SODIUM: 137 mmol/L (ref 135–145)

## 2015-06-11 LAB — CULTURE, BLOOD (ROUTINE X 2)

## 2015-06-11 LAB — CBC
HEMATOCRIT: 25.9 % — AB (ref 36.0–46.0)
HEMOGLOBIN: 8.4 g/dL — AB (ref 12.0–15.0)
MCH: 22.9 pg — AB (ref 26.0–34.0)
MCHC: 32.4 g/dL (ref 30.0–36.0)
MCV: 70.6 fL — ABNORMAL LOW (ref 78.0–100.0)
Platelets: 172 10*3/uL (ref 150–400)
RBC: 3.67 MIL/uL — ABNORMAL LOW (ref 3.87–5.11)
RDW: 17.4 % — AB (ref 11.5–15.5)
WBC: 10.2 10*3/uL (ref 4.0–10.5)

## 2015-06-11 LAB — GLUCOSE, CAPILLARY
Glucose-Capillary: 111 mg/dL — ABNORMAL HIGH (ref 65–99)
Glucose-Capillary: 144 mg/dL — ABNORMAL HIGH (ref 65–99)

## 2015-06-11 MED ORDER — BISACODYL 10 MG RE SUPP
10.0000 mg | Freq: Once | RECTAL | Status: AC
  Administered 2015-06-11: 10 mg via RECTAL
  Filled 2015-06-11: qty 1

## 2015-06-11 MED ORDER — FUROSEMIDE 80 MG PO TABS: 160.0000 mg | ORAL_TABLET | Freq: Two times a day (BID) | ORAL | Status: DC

## 2015-06-11 NOTE — Progress Notes (Signed)
Called and gave report to the nurse Brittney at [facility] facility; she verbalized understanding of pt's discharge instructions. IV removed without issue. Pt's foley catheter removed and pt voided after removal. Tele box removed and CCMD notified. Pt will leave the unit by stretcher via ambulance service.

## 2015-06-11 NOTE — Progress Notes (Signed)
SATURATION QUALIFICATIONS:   Patient Saturations on Room Air at Rest = 84%  Patient Saturations on 2 Liters of oxygen =95%    Please briefly explain why patient needs home oxygen: Pt unable to sustain adequate saturation without oxygen supplementation

## 2015-06-11 NOTE — Clinical Social Work Note (Addendum)
Ms. Williard will discharge back to [facility] today. Discharge information transmitted to facility and patient will be transported to assisted living by ambulance. Daughter Stormy Card and granddaughter Kalika Smay contacted and messages left for them to call CSW.  Reighlynn Swiney Givens, MSW, LCSW Licensed Clinical Social Worker [place] [phone]

## 2015-06-11 NOTE — Progress Notes (Signed)
SUBJECTIVE:  No acute  Events noted. Spoke with grand daughter who is a Marine scientist at [employer].  OBJECTIVE:   Vitals:   Filed Vitals:   06/10/15 1640 06/10/15 2100 06/11/15 0456 06/11/15 0903  BP: 130/69 130/48 161/49 155/51  Pulse: 59 61 68 72  Temp: 98.4 F (36.9 C) 99.5 F (37.5 C) 99.7 F (37.6 C) 97.9 F (36.6 C)  TempSrc: Oral Oral Oral Oral  Resp: 19 18 18 18   Height:      Weight:  185 lb 10 oz (84.2 kg)    SpO2: 94% 94% 92% 96%   I&O's:    Intake/Output Summary (Last 24 hours) at 06/11/15 0943 Last data filed at 06/10/15 1800  Gross per 24 hour  Intake    360 ml  Output    375 ml  Net    -15 ml        PHYSICAL EXAM General: Well developed, well nourished, in no acute distress Head:   Normal cephalic and atramatic  Lungs:  No wheezing. Heart:  RRR .     Neuro: answers questions appropriately Psych:  Flat affect    LABS: Basic Metabolic Panel:  Recent Labs  06/10/15 0526 06/11/15 0438  NA 138 137  K 4.5 4.5  CL 104 102  CO2 21* 26  GLUCOSE 107* 100*  BUN 114* 107*  CREATININE 2.72* 2.70*  CALCIUM 9.0 8.9   Liver Function Tests: No results for input(s): AST, ALT, ALKPHOS, BILITOT, PROT, ALBUMIN in the last 72 hours. No results for input(s): LIPASE, AMYLASE in the last 72 hours. CBC:  Recent Labs  06/09/15 0515 06/10/15 0526 06/11/15 0438  WBC 8.6 9.1 10.2  NEUTROABS 6.5  --   --   HGB 8.8* 8.4* 8.4*  HCT 27.3* 26.4* 25.9*  MCV 71.5* 71.5* 70.6*  PLT 174 175 172   Cardiac Enzymes: No results for input(s): CKTOTAL, CKMB, CKMBINDEX, TROPONINI in the last 72 hours. BNP: Invalid input(s): POCBNP D-Dimer: No results for input(s): DDIMER in the last 72 hours. Hemoglobin A1C: No results for input(s): HGBA1C in the last 72 hours. Fasting Lipid Panel: No results for input(s): CHOL, HDL, LDLCALC, TRIG, CHOLHDL, LDLDIRECT in the last 72 hours. Thyroid Function Tests: No results for input(s): TSH, T4TOTAL, T3FREE, THYROIDAB in the last 72  hours.  Invalid input(s): FREET3 Anemia Panel: No results for input(s): VITAMINB12, FOLATE, FERRITIN, TIBC, IRON, RETICCTPCT in the last 72 hours. Coag Panel:   Lab Results  Component Value Date   INR 1.10 06/05/2011   INR 1.10 04/17/2011    RADIOLOGY: Dg Chest 2 View  06/04/2015   CLINICAL DATA:  Shortness of breath starting last night  EXAM: CHEST  2 VIEW  COMPARISON:  04/28/2014  FINDINGS: Cardiomediastinal silhouette is stable. Status post CABG. Degenerative changes bilateral shoulders. Surgical clips are noted in right axilla. There is central vascular congestion and mild interstitial prominence bilaterally highly suspicious for pulmonary edema. Mild basilar atelectasis without segmental infiltrates. Stable degenerative changes thoracic spine.  IMPRESSION: Central vascular congestion mild interstitial prominence bilaterally highly suspicious for pulmonary edema. No segmental infiltrates. Status post CABG. Degenerative changes bilateral shoulders.   Electronically Signed   By: Lahoma Crocker M.D.   On: 06/04/2015 10:20   Dg Chest Port 1 View  06/10/2015   CLINICAL DATA:  Cough, CHF, history of CABG.  EXAM: PORTABLE CHEST - 1 VIEW  COMPARISON:  Portable chest x-ray of June 04, 2015  FINDINGS: The lungs are less well inflated today. The  pulmonary interstitial markings remain increased and there are areas of confluent alveolar opacity bilaterally. The costophrenic angles are obscured. The cardiac silhouette is enlarged. The pulmonary vascularity is engorged and there is mild cephalization. There are post CABG changes.  IMPRESSION: CHF with pulmonary interstitial and alveolar edema. There has been deterioration in the appearance of the pulmonary interstitium since the previous study.   Electronically Signed   By: David  Martinique M.D.   On: 06/10/2015 10:04      ASSESSMENT: Kathyrn Lass:    Chronic diastolic heart failure: stable. Appears euvolemic. Able to lie flat and breath comfortably.  On just 1 L [place]  oxygen.  CRI-stable.  Tele personally reviewed. No bradycardia.    Please call with questions over the weekend.  Jettie Booze, MD  06/11/2015  9:43 AM

## 2015-06-11 NOTE — Discharge Summary (Signed)
Physician Discharge Summary  [name] ELT:[id number] DOB: 1927-05-10 DOA: 06/04/2015  PCP: Simona Huh, MD  Admit date: 06/04/2015 Discharge date: 06/11/2015  Time spent: 45 minutes  Recommendations for Outpatient Follow-up:  Patient will be discharged to [facility] living, with PT.  Patient will need to follow up with primary care provider within one week of discharge, repeat CBC and BMP.  Patient will also need to follow up with Dr. Wynonia Lawman, cardiology, and nephrology within 1-2 weeks of discharge.  Patient should continue medications as prescribed.  Patient should follow a heart healthy/carb modified diet with 1235ml fluid restriction per day.   Discharge Diagnoses:  Acute respiratory failure with hypoxia Acute on chronic diastolic CHF Bradycardia Fever Status post AKA, right Diet-controlled diabetes mellitus CKD, stage IV Acute thrombocytopenia Hypertension Morbid obesity Left shoulder pain Mild lethargy  Discharge Condition: Stable  Diet recommendation: heart healthy/carb modified diet with 1271ml fluid restriction per day  [facility] Weights   06/09/15 2006 06/10/15 1213 06/10/15 2100  Weight: 95.5 kg (210 lb 8.6 oz) 84.188 kg (185 lb 9.6 oz) 84.2 kg (185 lb 10 oz)    History of present illness:  on 06/04/2015 by Ms. Erin Hearing, NP This is a 20 showed female patient with known peripheral vascular disease status post right AKA for nonhealing lower extremity ulcer in 2015. Because of profound deconditioning and issues with mobility patient resides at assisted living facility. Other past medical history includes diet-controlled diabetes, COPD, hypertension, grade 1 diastolic heart failure, chronic kidney disease stage IV followed by Dr. Justin Mend and breast cancer. She presented to the ER today with reports of chest pain that began around 7:30 today. By the time she had arrived to the ER the chest pain had resolved and patient was primarily complaining of a cough which  is chronic. It was reported that when they arrived to the facility patient was maintaining O2 saturations of 84% on room air. Because of report of chest pain MS also give the patient aspirin. After arrival to this facility patient did have productive sputum that was blood streaked in appearance. Patient had a rectal temperature of 100.4 in the ER, BP was 12 5/53, pulse was 61, O2 saturations were 96% on 4 L oxygen-she has been subsequently weaned down to 2 L oxygen. Chest x-ray was read as central vascular congestion with mild interstitial prominence bilaterally highly suspicious for pulmonary edema. On review of chest films there was a lacy type pattern that appears more consistent with viral pneumonitis. After arrival patient also began complaining of issues related to dysuria. Toward data revealed leukocytosis with a white count of 11,700 and a left shift with neutrophils 85%. Lactic acid was normal at 1.36, BNP was 308. Patient appeared to be in acute renal failure with a BUN of 106 noting one year ago BUN was 75, and creatinine 2.78 with creatinine only slightly higher than baseline 1 year ago. GFR 17, glucose 146, CO2 slightly decreased to 21. Patient later clarified her chest pain as being left lateral neck pain extending to the chest that was worsened when she turned her head.  Hospital Course:  Acute respiratory failure with hypoxia -likely secondary to acute diastolic CHF rather than PNA -Patient currently afebrile, no leukocytosis -Lactic acid and procalcitonin level WNL as well and given recent C. Diff, will avoid ABX as possible -Continue Lasix and follow up on cardiology/nephrology recommendations regarding the dosing  -Continue supplemental oxygen to maintain sats above 92%. -Patient feels she cannot catch her breath.  Repeated Xray: CHF with pulmonary interstitial and alveolar edema  Acute on chronic diastolic CHF -Echocardiogram in April 3664: grade I diastolic dysfunction -Repeat  echocardiogram shows EF 40-34%, grade 3 diastolic dysfunction -Was placed on Lasix 80mg  BID IV per nephrologist  -Doubt daily weights, intake and output are being monitored appropriately -Cardiology consultation appreciated, no further ischemic workup needed at this point. -Appears to be improved, patient is now down to her weight of 84 kg. -Spoke with nephrology, patient will be discharged with Lasix PO 160 mg twice a day  Bradycardia -better since Coreg stopped  -appreciate cardiology following, no recommendation for further ischemic work up   Fever -Tmax 99.9 F 7/23, resolved  -blood cultures with coag neg staph, likely contaminant so we have stopped vanc 7/25  S/P AKA (above knee amputation), right  -stable   Diet-controlled diabetes mellitus with complications of CKD and PVD -continue insulin sliding scale CBG monitoring  CKD (chronic kidney disease), stage IV -close monitoring while on Lasix IV, dose increased to 80 mg BID per nephrologist  -Creatinine currently 2.70 (GFR remains in Stage 4) -Repeat BMP in 1 week  Acute thrombocytopenia -close monitoring, Plt remain stable, currently 172 -Repeat CBC in 1 week  HTN (hypertension) -continue home medical regimen but hold coreg due to bradycardia   Morbid obesity with BMI > 35 and underlying risk factors of HTN, CKD -Body mass index is 35.79 kg/(m^2).  Left shoulder pain and crepitus -Per patient this has been ongoing for over a year. Patient received injections in her right shoulder. Spoke with granddaughter, this has also been ongoing issue. -Continue pain control -PT consulted recommended Home health  Mildly Lethargic -Improved -Patient currently afebrile, no leukocytosis  -CXR showed no infection -Repeat UA unimpressive  Procedures  Echcoardiogram  Consults  Nephrology Cardiology  Discharge Exam: Filed Vitals:   06/11/15 0903  BP: 155/51  Pulse: 72  Temp: 97.9 F (36.6 C)  Resp: 18    Exam  General: Well developed, well nourished, no distress  HEENT: NCAT,mucous membranes moist.   Cardiovascular: S1 S2 auscultated, RRR, no murmurs appreciated  Respiratory: Diminished but clear breath sounds anteriorly, no wheezing noted  Abdomen: Soft, obese, nontender, nondistended, + bowel sounds  Extremities: RAKA, No LLE edema.  Discharge Instructions      Discharge Instructions    Discharge instructions    Complete by:  As directed   Patient will be discharged to [facility] living with PT.  Patient will need to follow up with primary care provider within one week of discharge, repeat CBC and BMP.  Patient will also need to follow up with Dr. Wynonia Lawman, cardiology, and nephrology within 1-2 weeks of discharge.  Patient should continue medications as prescribed.  Patient should follow a heart healthy/carb modified diet with 1234ml fluid restriction per day.            Medication List    STOP taking these medications        COLCRYS 0.6 MG tablet  Generic drug:  colchicine      TAKE these medications        acetaminophen 500 MG tablet  Commonly known as:  TYLENOL  Take 500 mg by mouth every 6 (six) hours as needed for mild pain.     allopurinol 100 MG tablet  Commonly known as:  ZYLOPRIM  Take 1 tablet (100 mg total) by mouth at bedtime.     amLODipine 10 MG tablet  Commonly known as:  NORVASC  Take 1  tablet (10 mg total) by mouth daily.     aspirin EC 81 MG tablet  Take 81 mg by mouth daily.     capsaicin 0.025 % cream  Commonly known as:  ZOSTRIX  Apply topically 2 (two) times daily.     carvedilol 3.125 MG tablet  Commonly known as:  COREG  Take 1 tablet (3.125 mg total) by mouth 2 (two) times daily with a meal.     citalopram 10 MG tablet  Commonly known as:  CELEXA  Take 1 tablet (10 mg total) by mouth daily.     cloNIDine 0.1 MG tablet  Commonly known as:  CATAPRES  Take 0.1 mg by mouth once as needed (if sbp >180 and dbp >110).      clotrimazole 1 % external solution  Commonly known as:  LOTRIMIN  Apply 1 application topically 2 (two) times daily as needed (fungal rash on foot).     ezetimibe 10 MG tablet  Commonly known as:  ZETIA  Take 1 tablet (10 mg total) by mouth daily.     famotidine 20 MG tablet  Commonly known as:  PEPCID  Take 1 tablet (20 mg total) by mouth daily.     furosemide 80 MG tablet  Commonly known as:  LASIX  Take 2 tablets (160 mg total) by mouth 2 (two) times daily.     Glucosamine 500 MG Caps  Take 1,500 mg by mouth 3 (three) times daily.     hydrALAZINE 100 MG tablet  Commonly known as:  APRESOLINE  Take 1 tablet (100 mg total) by mouth 3 (three) times daily.     iron polysaccharides 150 MG capsule  Commonly known as:  NIFEREX  Take 1 capsule (150 mg total) by mouth 2 (two) times daily.     isosorbide mononitrate 120 MG 24 hr tablet  Commonly known as:  IMDUR  Take 1 tablet (120 mg total) by mouth daily.     ketoconazole 2 % cream  Commonly known as:  NIZORAL  Apply 1 application topically daily as needed for irritation.     loratadine 10 MG tablet  Commonly known as:  CLARITIN  Take 1 tablet (10 mg total) by mouth daily.     montelukast 10 MG tablet  Commonly known as:  SINGULAIR  Take 1 tablet (10 mg total) by mouth at bedtime.     polyethylene glycol packet  Commonly known as:  MIRALAX / GLYCOLAX  Take 17 g by mouth daily as needed for mild constipation.     PROCRIT 64403 UNIT/ML injection  Generic drug:  epoetin alfa  Inject 20,000 Units into the skin every 14 (fourteen) days.     sennosides-docusate sodium 8.6-50 MG tablet  Commonly known as:  SENOKOT-S  Take 1 tablet by mouth daily as needed for constipation.     timolol 0.5 % ophthalmic solution  Commonly known as:  BETIMOL  Place 1 drop into both eyes 2 (two) times daily.     traMADol 50 MG tablet  Commonly known as:  ULTRAM  Take 1 tablet (50 mg total) by mouth every 12 (twelve) hours as needed (1  to 2 tablets for pain not relieved by acetaminophen).     triamcinolone cream 0.1 %  Commonly known as:  KENALOG  Apply 1 application topically 2 (two) times daily as needed (eczema).     VOL-CARE RX PO  Take 1 tablet by mouth daily. Nephro-Vite 0.8 mg qd  Allergies  Allergen Reactions  . Celebrex [Celecoxib] Rash  . Codeine Other (See Comments)    REACTION: unknown  . Iodine Rash  . Omnipaque [Iohexol] Other (See Comments)    REACTION: unknown   Follow-up Information    Follow up with Simona Huh, MD. Schedule an appointment as soon as possible for a visit in 1 week.   Specialty:  Family Medicine   Why:  Hospital followup, repeat CBC, BMP   Contact information:   [address]  [place] [address] [phone]       Schedule an appointment as soon as possible for a visit with Ezzard Standing, MD.   Specialty:  Cardiology   Why:  As needed   Contact information:   [address] [place] [address] [phone]        The results of significant diagnostics from this hospitalization (including imaging, microbiology, ancillary and laboratory) are listed below for reference.    Significant Diagnostic Studies: Dg Chest 2 View  06/04/2015   CLINICAL DATA:  Shortness of breath starting last night  EXAM: CHEST  2 VIEW  COMPARISON:  04/28/2014  FINDINGS: Cardiomediastinal silhouette is stable. Status post CABG. Degenerative changes bilateral shoulders. Surgical clips are noted in right axilla. There is central vascular congestion and mild interstitial prominence bilaterally highly suspicious for pulmonary edema. Mild basilar atelectasis without segmental infiltrates. Stable degenerative changes thoracic spine.  IMPRESSION: Central vascular congestion mild interstitial prominence bilaterally highly suspicious for pulmonary edema. No segmental infiltrates. Status post CABG. Degenerative changes bilateral shoulders.   Electronically  Signed   By: Lahoma Crocker M.D.   On: 06/04/2015 10:20   Dg Chest Port 1 View  06/10/2015   CLINICAL DATA:  Cough, CHF, history of CABG.  EXAM: PORTABLE CHEST - 1 VIEW  COMPARISON:  Portable chest x-ray of June 04, 2015  FINDINGS: The lungs are less well inflated today. The pulmonary interstitial markings remain increased and there are areas of confluent alveolar opacity bilaterally. The costophrenic angles are obscured. The cardiac silhouette is enlarged. The pulmonary vascularity is engorged and there is mild cephalization. There are post CABG changes.  IMPRESSION: CHF with pulmonary interstitial and alveolar edema. There has been deterioration in the appearance of the pulmonary interstitium since the previous study.   Electronically Signed   By: David  Martinique M.D.   On: 06/10/2015 10:04    Microbiology: Recent Results (from the past 240 hour(s))  Blood Culture (routine x 2)     Status: None   Collection Time: 06/04/15 10:50 AM  Result Value Ref Range Status   Specimen Description BLOOD RIGHT HAND  Final   Special Requests BOTTLES DRAWN AEROBIC AND ANAEROBIC 5CC  Final   Culture NO GROWTH 5 DAYS  Final   Report Status 06/09/2015 FINAL  Final  Blood Culture (routine x 2)     Status: None   Collection Time: 06/04/15 11:00 AM  Result Value Ref Range Status   Specimen Description BLOOD LEFT HAND  Final   Special Requests BOTTLES DRAWN AEROBIC AND ANAEROBIC 5CC  Final   Culture  Setup Time   Final    GRAM POSITIVE COCCI IN CLUSTERS AEROBIC BOTTLE ONLY CRITICAL RESULT CALLED TO, READ BACK BY AND VERIFIED WITH: BRAKE,A RN 06/05/15 1915 WOOTEN,K CONFIRMED BY B MARTIN    Culture   Final    STAPHYLOCOCCUS SPECIES (COAGULASE NEGATIVE) THE SIGNIFICANCE OF ISOLATING THIS ORGANISM FROM A SINGLE SET OF BLOOD CULTURES WHEN MULTIPLE  SETS ARE DRAWN IS UNCERTAIN. PLEASE NOTIFY THE MICROBIOLOGY DEPARTMENT WITHIN ONE WEEK IF SPECIATION AND SENSITIVITIES ARE REQUIRED.    Report Status 06/11/2015 FINAL  Final   Urine culture     Status: None   Collection Time: 06/04/15  1:26 PM  Result Value Ref Range Status   Specimen Description URINE, CATHETERIZED  Final   Special Requests NONE  Final   Culture NO GROWTH 1 DAY  Final   Report Status 06/05/2015 FINAL  Final  Culture, sputum-assessment     Status: None   Collection Time: 06/05/15  6:44 PM  Result Value Ref Range Status   Specimen Description SPUTUM  Final   Special Requests NONE  Final   Sputum evaluation   Final    THIS SPECIMEN IS ACCEPTABLE. RESPIRATORY CULTURE REPORT TO FOLLOW.   Report Status 06/05/2015 FINAL  Final  Culture, respiratory (NON-Expectorated)     Status: None   Collection Time: 06/05/15  6:44 PM  Result Value Ref Range Status   Specimen Description SPUTUM  Final   Special Requests NONE  Final   Gram Stain   Final    FEW WBC PRESENT, PREDOMINANTLY MONONUCLEAR ABUNDANT SQUAMOUS EPITHELIAL CELLS PRESENT MODERATE GRAM POSITIVE COCCI IN CHAINS IN PAIRS FEW GRAM NEGATIVE RODS Performed at [facility]    Culture   Final    NORMAL OROPHARYNGEAL FLORA Performed at [facility]    Report Status 06/08/2015 FINAL  Final     Labs: Basic Metabolic Panel:  Recent Labs Lab 06/07/15 0614 06/08/15 0430 06/09/15 0515 06/10/15 0526 06/11/15 0438  NA 132* 133* 136 138 137  K 4.3 4.4 4.5 4.5 4.5  CL 100* 100* 103 104 102  CO2 22 23 23  21* 26  GLUCOSE 92 107* 99 107* 100*  BUN 112* 117* 113* 114* 107*  CREATININE 2.91* 2.85* 2.59* 2.72* 2.70*  CALCIUM 8.4* 8.3* 8.8* 9.0 8.9   Liver Function Tests: No results for input(s): AST, ALT, ALKPHOS, BILITOT, PROT, ALBUMIN in the last 168 hours. No results for input(s): LIPASE, AMYLASE in the last 168 hours. No results for input(s): AMMONIA in the last 168 hours. CBC:  Recent Labs Lab 06/07/15 0614 06/08/15 0430 06/09/15 0515 06/10/15 0526 06/11/15 0438  WBC 7.0 7.0 8.6 9.1 10.2  NEUTROABS  --   --  6.5  --   --   HGB 8.6* 8.7* 8.8* 8.4* 8.4*  HCT  26.4* 26.9* 27.3* 26.4* 25.9*  MCV 70.8* 71.4* 71.5* 71.5* 70.6*  PLT 166 184 174 175 172   Cardiac Enzymes: No results for input(s): CKTOTAL, CKMB, CKMBINDEX, TROPONINI in the last 168 hours. BNP: BNP (last 3 results)  Recent Labs  06/04/15 0911  BNP 308.6*    ProBNP (last 3 results) No results for input(s): PROBNP in the last 8760 hours.  CBG:  Recent Labs Lab 06/10/15 0803 06/10/15 1144 06/10/15 1635 06/10/15 2141 06/11/15 0758  GLUCAP 118* 99 125* 116* 111*       Signed:  Cristal Ford  [facility] 06/11/2015, 10:54 AM

## 2015-06-11 NOTE — Discharge Instructions (Signed)
Heart Failure °Heart failure is a condition in which the heart has trouble pumping blood. This means your heart does not pump blood efficiently for your body to work well. In some cases of heart failure, fluid may back up into your lungs or you may have swelling (edema) in your lower legs. Heart failure is usually a long-term (chronic) condition. It is important for you to take good care of yourself and follow your health care provider's treatment plan. °CAUSES  °Some health conditions can cause heart failure. Those health conditions include: °· High blood pressure (hypertension). Hypertension causes the heart muscle to work harder than normal. When pressure in the blood vessels is high, the heart needs to pump (contract) with more force in order to circulate blood throughout the body. High blood pressure eventually causes the heart to become stiff and weak. °· Coronary artery disease (CAD). CAD is the buildup of cholesterol and fat (plaque) in the arteries of the heart. The blockage in the arteries deprives the heart muscle of oxygen and blood. This can cause chest pain and may lead to a heart attack. High blood pressure can also contribute to CAD. °· Heart attack (myocardial infarction). A heart attack occurs when one or more arteries in the heart become blocked. The loss of oxygen damages the muscle tissue of the heart. When this happens, part of the heart muscle dies. The injured tissue does not contract as well and weakens the heart's ability to pump blood. °· Abnormal heart valves. When the heart valves do not open and close properly, it can cause heart failure. This makes the heart muscle pump harder to keep the blood flowing. °· Heart muscle disease (cardiomyopathy or myocarditis). Heart muscle disease is damage to the heart muscle from a variety of causes. These can include drug or alcohol abuse, infections, or unknown reasons. These can increase the risk of heart failure. °· Lung disease. Lung disease  makes the heart work harder because the lungs do not work properly. This can cause a strain on the heart, leading it to fail. °· Diabetes. Diabetes increases the risk of heart failure. High blood sugar contributes to high fat (lipid) levels in the blood. Diabetes can also cause slow damage to tiny blood vessels that carry important nutrients to the heart muscle. When the heart does not get enough oxygen and food, it can cause the heart to become weak and stiff. This leads to a heart that does not contract efficiently. °· Other conditions can contribute to heart failure. These include abnormal heart rhythms, thyroid problems, and low blood counts (anemia). °Certain unhealthy behaviors can increase the risk of heart failure, including: °· Being overweight. °· Smoking or chewing tobacco. °· Eating foods high in fat and cholesterol. °· Abusing illicit drugs or alcohol. °· Lacking physical activity. °SYMPTOMS  °Heart failure symptoms may vary and can be hard to detect. Symptoms may include: °· Shortness of breath with activity, such as climbing stairs. °· Persistent cough. °· Swelling of the feet, ankles, legs, or abdomen. °· Unexplained weight gain. °· Difficulty breathing when lying flat (orthopnea). °· Waking from sleep because of the need to sit up and get more air. °· Rapid heartbeat. °· Fatigue and loss of energy. °· Feeling light-headed, dizzy, or close to fainting. °· Loss of appetite. °· Nausea. °· Increased urination during the night (nocturia). °DIAGNOSIS  °A diagnosis of heart failure is based on your history, symptoms, physical examination, and diagnostic tests. Diagnostic tests for heart failure may include: °·   Echocardiography. °· Electrocardiography. °· Chest X-ray. °· Blood tests. °· Exercise stress test. °· Cardiac angiography. °· Radionuclide scans. °TREATMENT  °Treatment is aimed at managing the symptoms of heart failure. Medicines, behavioral changes, or surgical intervention may be necessary to  treat heart failure. °· Medicines to help treat heart failure may include: °¨ Angiotensin-converting enzyme (ACE) inhibitors. This type of medicine blocks the effects of a blood protein called angiotensin-converting enzyme. ACE inhibitors relax (dilate) the blood vessels and help lower blood pressure. °¨ Angiotensin receptor blockers (ARBs). This type of medicine blocks the actions of a blood protein called angiotensin. Angiotensin receptor blockers dilate the blood vessels and help lower blood pressure. °¨ Water pills (diuretics). Diuretics cause the kidneys to remove salt and water from the blood. The extra fluid is removed through urination. This loss of extra fluid lowers the volume of blood the heart pumps. °¨ Beta blockers. These prevent the heart from beating too fast and improve heart muscle strength. °¨ Digitalis. This increases the force of the heartbeat. °· Healthy behavior changes include: °¨ Obtaining and maintaining a healthy weight. °¨ Stopping smoking or chewing tobacco. °¨ Eating heart-healthy foods. °¨ Limiting or avoiding alcohol. °¨ Stopping illicit drug use. °¨ Physical activity as directed by your health care provider. °· Surgical treatment for heart failure may include: °¨ A procedure to open blocked arteries, repair damaged heart valves, or remove damaged heart muscle tissue. °¨ A pacemaker to improve heart muscle function and control certain abnormal heart rhythms. °¨ An internal cardioverter defibrillator to treat certain serious abnormal heart rhythms. °¨ A left ventricular assist device (LVAD) to assist the pumping ability of the heart. °HOME CARE INSTRUCTIONS  °· Take medicines only as directed by your health care provider. Medicines are important in reducing the workload of your heart, slowing the progression of heart failure, and improving your symptoms. °¨ Do not stop taking your medicine unless directed by your health care provider. °¨ Do not skip any dose of medicine. °¨ Refill your  prescriptions before you run out of medicine. Your medicines are needed every day. °· Engage in moderate physical activity if directed by your health care provider. Moderate physical activity can benefit some people. The elderly and people with severe heart failure should consult with a health care provider for physical activity recommendations. °· Eat heart-healthy foods. Food choices should be free of trans fat and low in saturated fat, cholesterol, and salt (sodium). Healthy choices include fresh or frozen fruits and vegetables, fish, lean meats, legumes, fat-free or low-fat dairy products, and whole grain or high fiber foods. Talk to a dietitian to learn more about heart-healthy foods. °· Limit sodium if directed by your health care provider. Sodium restriction may reduce symptoms of heart failure in some people. Talk to a dietitian to learn more about heart-healthy seasonings. °· Use healthy cooking methods. Healthy cooking methods include roasting, grilling, broiling, baking, poaching, steaming, or stir-frying. Talk to a dietitian to learn more about healthy cooking methods. °· Limit fluids if directed by your health care provider. Fluid restriction may reduce symptoms of heart failure in some people. °· Weigh yourself every day. Daily weights are important in the early recognition of excess fluid. You should weigh yourself every morning after you urinate and before you eat breakfast. Wear the same amount of clothing each time you weigh yourself. Record your daily weight. Provide your health care provider with your weight record. °· Monitor and record your blood pressure if directed by your health care   provider.  Check your pulse if directed by your health care provider.  Lose weight if directed by your health care provider. Weight loss may reduce symptoms of heart failure in some people.  Stop smoking or chewing tobacco. Nicotine makes your heart work harder by causing your blood vessels to constrict.  Do not use nicotine gum or patches before talking to your health care provider.  Keep all follow-up visits as directed by your health care provider. This is important.  Limit alcohol intake to no more than 1 drink per day for nonpregnant women and 2 drinks per day for men. One drink equals 12 ounces of beer, 5 ounces of wine, or 1 ounces of hard liquor. Drinking more than that is harmful to your heart. Tell your health care provider if you drink alcohol several times a week. Talk with your health care provider about whether alcohol is safe for you. If your heart has already been damaged by alcohol or you have severe heart failure, drinking alcohol should be stopped completely.  Stop illicit drug use.  Stay up-to-date with immunizations. It is especially important to prevent respiratory infections through current pneumococcal and influenza immunizations.  Manage other health conditions such as hypertension, diabetes, thyroid disease, or abnormal heart rhythms as directed by your health care provider.  Learn to manage stress.  Plan rest periods when fatigued.  Learn strategies to manage high temperatures. If the weather is extremely hot:  Avoid vigorous physical activity.  Use air conditioning or fans or seek a cooler location.  Avoid caffeine and alcohol.  Wear loose-fitting, lightweight, and light-colored clothing.  Learn strategies to manage cold temperatures. If the weather is extremely cold:  Avoid vigorous physical activity.  Layer clothes.  Wear mittens or gloves, a hat, and a scarf when going outside.  Avoid alcohol.  Obtain ongoing education and support as needed.  Participate in or seek rehabilitation as needed to maintain or improve independence and quality of life. SEEK MEDICAL CARE IF:   Your weight increases by 03 lb/1.4 kg in 1 day or 05 lb/2.3 kg in a week.  You have increasing shortness of breath that is unusual for you.  You are unable to participate in  your usual physical activities.  You tire easily.  You cough more than normal, especially with physical activity.  You have any or more swelling in areas such as your hands, feet, ankles, or abdomen.  You are unable to sleep because it is hard to breathe.  You feel like your heart is beating fast (palpitations).  You become dizzy or light-headed upon standing up. SEEK IMMEDIATE MEDICAL CARE IF:   You have difficulty breathing.  There is a change in mental status such as decreased alertness or difficulty with concentration.  You have a pain or discomfort in your chest.  You have an episode of fainting (syncope). MAKE SURE YOU:   Understand these instructions.  Will watch your condition.  Will get help right away if you are not doing well or get worse. Document Released: 10/30/2005 Document Revised: 03/16/2014 Document Reviewed: 11/29/2012 [facility] Patient Information 2015 [place], [place]. This information is not intended to replace advice given to you by your health care provider. Make sure you discuss any questions you have with your health care provider. Acute Respiratory Failure Respiratory failure is when your lungs are not working well and your breathing (respiratory) system fails. When respiratory failure occurs, it is difficult for your lungs to get enough oxygen, get rid of carbon  dioxide, or both. Respiratory failure can be life threatening.  Respiratory failure can be acute or chronic. Acute respiratory failure is sudden, severe, and requires emergency medical treatment. Chronic respiratory failure is less severe, happens over time, and requires ongoing treatment.  WHAT ARE THE CAUSES OF ACUTE RESPIRATORY FAILURE?  Any problem affecting the heart or lungs can cause acute respiratory failure. Some of these causes include the following:  Chronic bronchitis and emphysema (COPD).   Blood clot going to a lung (pulmonary embolism).   Having water in the lungs caused by  heart failure, lung injury, or infection (pulmonary edema).   Collapsed lung (pneumothorax).   Pneumonia.   Pulmonary fibrosis.   Obesity.   Asthma.   Heart failure.   Any type of trauma to the chest that can make breathing difficult.   Nerve or muscle diseases making chest movements difficult. WHAT SYMPTOMS SHOULD YOU WATCH FOR?  If you have any of these signs or symptoms, you should seek immediate medical care:   You have shortness of breath (dyspnea) with or without activity.   You have rapid, fast breathing (tachypnea).   You are wheezing.  You are unable to say more than a few words without having to catch your breath.  You find it very difficult to function normally.  You have a fast heart rate.   You have a bluish color to your finger or toe nail beds.   You have confusion or drowsiness or both.  HOW WILL MY ACUTE RESPIRATORY FAILURE BE TREATED?  Treatment of acute respiratory failure depends on the cause of the respiratory failure. Usually, you will stay in the intensive care unit so your breathing can be watched closely. Treatment can include the following:  Oxygen. Oxygen can be delivered through the following:  Nasal cannula. This is small tubing that goes in your nose to give you oxygen.  Face mask. A face mask covers your nose and mouth to give you oxygen.  Medicine. Different medicines can be given to help with breathing. These can include:  Nebulizers. Nebulizers deliver medicines to open the air passages (bronchodilators). These medicines help to open or relax the airways in the lungs so you can breathe better. They can also help loosen mucus from your lungs.  Diuretics. Diuretic medicines can help you breathe better by getting rid of extra water in your body.  Steroids. Steroid medicines can help decrease swelling (inflammation) in your lungs.  Antibiotics.  Chest tube. If you have a collapsed lung (pneumothorax), a chest tube is  placed to help reinflate the lung.  Non-invasive positive pressure ventilation (NPPV). This is a tight-fitting mask that goes over your nose and mouth. The mask has tubing that is attached to a machine. The machine blows air into the tubing, which helps to keep the tiny air sacs (alveoli) in your lungs open. This machine allows you to breathe on your own.  Ventilator. A ventilator is a breathing machine. When on a ventilator, a breathing tube is put into the lungs. A ventilator is used when you can no longer breathe well enough on your own. You may have low oxygen levels or high carbon dioxide (CO2) levels in your blood. When you are on a ventilator, sedation and pain medicines are given to make you sleep so your lungs can heal. Document Released: 11/04/2013 Document Revised: 03/16/2014 Document Reviewed: 11/04/2013 [facility] Patient Information 2015 [place], [place]. This information is not intended to replace advice given to you by your health care provider.  Make sure you discuss any questions you have with your health care provider. ° °

## 2015-06-12 LAB — URINE CULTURE

## 2015-06-15 LAB — CULTURE, BLOOD (ROUTINE X 2)
Culture: NO GROWTH
Culture: NO GROWTH

## 2015-06-24 ENCOUNTER — Emergency Department (HOSPITAL_COMMUNITY): Payer: Medicare Other

## 2015-06-24 ENCOUNTER — Encounter (HOSPITAL_COMMUNITY): Payer: Self-pay | Admitting: Emergency Medicine

## 2015-06-24 ENCOUNTER — Emergency Department (HOSPITAL_COMMUNITY)
Admission: EM | Admit: 2015-06-24 | Discharge: 2015-06-24 | Disposition: A | Discharge status: Home / Self Care | Payer: Medicare Other | Attending: Emergency Medicine | Admitting: Emergency Medicine

## 2015-06-24 DIAGNOSIS — D649 Anemia, unspecified: Secondary | ICD-10-CM | POA: Diagnosis not present

## 2015-06-24 DIAGNOSIS — M1990 Unspecified osteoarthritis, unspecified site: Secondary | ICD-10-CM | POA: Diagnosis not present

## 2015-06-24 DIAGNOSIS — I2510 Atherosclerotic heart disease of native coronary artery without angina pectoris: Secondary | ICD-10-CM | POA: Insufficient documentation

## 2015-06-24 DIAGNOSIS — E785 Hyperlipidemia, unspecified: Secondary | ICD-10-CM | POA: Insufficient documentation

## 2015-06-24 DIAGNOSIS — M79672 Pain in left foot: Secondary | ICD-10-CM | POA: Insufficient documentation

## 2015-06-24 DIAGNOSIS — R05 Cough: Secondary | ICD-10-CM

## 2015-06-24 DIAGNOSIS — Z8701 Personal history of pneumonia (recurrent): Secondary | ICD-10-CM | POA: Insufficient documentation

## 2015-06-24 DIAGNOSIS — Z79899 Other long term (current) drug therapy: Secondary | ICD-10-CM | POA: Diagnosis not present

## 2015-06-24 DIAGNOSIS — Z8673 Personal history of transient ischemic attack (TIA), and cerebral infarction without residual deficits: Secondary | ICD-10-CM | POA: Diagnosis not present

## 2015-06-24 DIAGNOSIS — G479 Sleep disorder, unspecified: Secondary | ICD-10-CM | POA: Insufficient documentation

## 2015-06-24 DIAGNOSIS — I129 Hypertensive chronic kidney disease with stage 1 through stage 4 chronic kidney disease, or unspecified chronic kidney disease: Secondary | ICD-10-CM | POA: Insufficient documentation

## 2015-06-24 DIAGNOSIS — R300 Dysuria: Secondary | ICD-10-CM | POA: Insufficient documentation

## 2015-06-24 DIAGNOSIS — H409 Unspecified glaucoma: Secondary | ICD-10-CM | POA: Diagnosis not present

## 2015-06-24 DIAGNOSIS — E119 Type 2 diabetes mellitus without complications: Secondary | ICD-10-CM | POA: Diagnosis not present

## 2015-06-24 DIAGNOSIS — G8929 Other chronic pain: Secondary | ICD-10-CM | POA: Insufficient documentation

## 2015-06-24 DIAGNOSIS — Z86718 Personal history of other venous thrombosis and embolism: Secondary | ICD-10-CM | POA: Insufficient documentation

## 2015-06-24 DIAGNOSIS — Z853 Personal history of malignant neoplasm of breast: Secondary | ICD-10-CM | POA: Diagnosis not present

## 2015-06-24 DIAGNOSIS — Z951 Presence of aortocoronary bypass graft: Secondary | ICD-10-CM | POA: Insufficient documentation

## 2015-06-24 DIAGNOSIS — F329 Major depressive disorder, single episode, unspecified: Secondary | ICD-10-CM | POA: Insufficient documentation

## 2015-06-24 DIAGNOSIS — Z7982 Long term (current) use of aspirin: Secondary | ICD-10-CM | POA: Insufficient documentation

## 2015-06-24 DIAGNOSIS — M25512 Pain in left shoulder: Secondary | ICD-10-CM | POA: Insufficient documentation

## 2015-06-24 DIAGNOSIS — R531 Weakness: Secondary | ICD-10-CM | POA: Diagnosis present

## 2015-06-24 DIAGNOSIS — Z8719 Personal history of other diseases of the digestive system: Secondary | ICD-10-CM | POA: Diagnosis not present

## 2015-06-24 DIAGNOSIS — I5032 Chronic diastolic (congestive) heart failure: Secondary | ICD-10-CM | POA: Diagnosis not present

## 2015-06-24 DIAGNOSIS — Z87891 Personal history of nicotine dependence: Secondary | ICD-10-CM | POA: Diagnosis not present

## 2015-06-24 DIAGNOSIS — J441 Chronic obstructive pulmonary disease with (acute) exacerbation: Secondary | ICD-10-CM | POA: Insufficient documentation

## 2015-06-24 DIAGNOSIS — N183 Chronic kidney disease, stage 3 (moderate): Secondary | ICD-10-CM | POA: Diagnosis not present

## 2015-06-24 DIAGNOSIS — R059 Cough, unspecified: Secondary | ICD-10-CM

## 2015-06-24 DIAGNOSIS — R001 Bradycardia, unspecified: Secondary | ICD-10-CM | POA: Diagnosis not present

## 2015-06-24 LAB — URINALYSIS, ROUTINE W REFLEX MICROSCOPIC
Bilirubin Urine: NEGATIVE
GLUCOSE, UA: NEGATIVE mg/dL
HGB URINE DIPSTICK: NEGATIVE
KETONES UR: NEGATIVE mg/dL
LEUKOCYTES UA: NEGATIVE
NITRITE: NEGATIVE
Protein, ur: NEGATIVE mg/dL
Specific Gravity, Urine: 1.007 (ref 1.005–1.030)
Urobilinogen, UA: 0.2 mg/dL (ref 0.0–1.0)
pH: 7 (ref 5.0–8.0)

## 2015-06-24 LAB — I-STAT TROPONIN, ED: Troponin i, poc: 0 ng/mL (ref 0.00–0.08)

## 2015-06-24 LAB — CBC WITH DIFFERENTIAL/PLATELET
BASOS ABS: 0.1 10*3/uL (ref 0.0–0.1)
BASOS PCT: 1 % (ref 0–1)
Eosinophils Absolute: 0.5 10*3/uL (ref 0.0–0.7)
Eosinophils Relative: 6 % — ABNORMAL HIGH (ref 0–5)
HCT: 28.5 % — ABNORMAL LOW (ref 36.0–46.0)
HEMOGLOBIN: 9.2 g/dL — AB (ref 12.0–15.0)
Lymphocytes Relative: 18 % (ref 12–46)
Lymphs Abs: 1.5 10*3/uL (ref 0.7–4.0)
MCH: 23.1 pg — ABNORMAL LOW (ref 26.0–34.0)
MCHC: 32.3 g/dL (ref 30.0–36.0)
MCV: 71.4 fL — ABNORMAL LOW (ref 78.0–100.0)
Monocytes Absolute: 0.4 10*3/uL (ref 0.1–1.0)
Monocytes Relative: 4 % (ref 3–12)
NEUTROS PCT: 71 % (ref 43–77)
Neutro Abs: 6 10*3/uL (ref 1.7–7.7)
PLATELETS: 250 10*3/uL (ref 150–400)
RBC: 3.99 MIL/uL (ref 3.87–5.11)
RDW: 17.8 % — ABNORMAL HIGH (ref 11.5–15.5)
WBC: 8.4 10*3/uL (ref 4.0–10.5)

## 2015-06-24 LAB — COMPREHENSIVE METABOLIC PANEL
ALBUMIN: 3.3 g/dL — AB (ref 3.5–5.0)
ALT: 9 U/L — ABNORMAL LOW (ref 14–54)
ANION GAP: 13 (ref 5–15)
AST: 28 U/L (ref 15–41)
Alkaline Phosphatase: 66 U/L (ref 38–126)
BILIRUBIN TOTAL: 0.6 mg/dL (ref 0.3–1.2)
BUN: 97 mg/dL — AB (ref 6–20)
CALCIUM: 8.9 mg/dL (ref 8.9–10.3)
CHLORIDE: 104 mmol/L (ref 101–111)
CO2: 25 mmol/L (ref 22–32)
CREATININE: 3.3 mg/dL — AB (ref 0.44–1.00)
GFR calc Af Amer: 14 mL/min — ABNORMAL LOW (ref >60)
GFR calc non Af Amer: 12 mL/min — ABNORMAL LOW (ref >60)
Glucose, Bld: 116 mg/dL — ABNORMAL HIGH (ref 65–99)
Potassium: 4.4 mmol/L (ref 3.5–5.1)
Sodium: 142 mmol/L (ref 135–145)
TOTAL PROTEIN: 6.7 g/dL (ref 6.5–8.1)

## 2015-06-24 LAB — I-STAT CG4 LACTIC ACID, ED: Lactic Acid, Venous: 0.71 mmol/L (ref 0.5–2.0)

## 2015-06-24 NOTE — ED Provider Notes (Signed)
CSN: [id number]     Arrival date & time 06/24/15  1729 History   First MD Initiated Contact with Patient 06/24/15 1800     Chief Complaint  Patient presents with  . Weakness     (Consider location/radiation/quality/duration/timing/severity/associated sxs/prior Treatment) HPI Comments: [name] is a 81 y.o. female with a PMHx of DM2, HTN, CHF, CAD s/p CABG, COPD, b/l cataracts s/p surgical repair, HLD, PVD s/p fem-pop bypass, TIA, recent PNA (discharged 7/29), CKD, R breast CA s/p lumpectomy, and home oxygen requirement as well as multiple medical conditions as listed below, who presents to the ED via EMS from [facility], with reports from the NH that pt was "not able" to get out of bed for the last two days, and has not been getting into her wheelchair as she usually does per EMS report. Pt is a difficult historian. She reports that occasionally she has difficulty sleeping, and that in the mornings she does not feel like getting up and participating in activities, and that this morning she did not feel like getting out of bed. She denies feeling weak or fatigued at this time, and states she feels better. She has complaints of dysuria with urination, and cough with white sputum production that occasionally has a streak of bright red blood. She does not know exactly when her symptoms started, or if this is new when compared to her prior admission for PNA on 7/22-29. She also complains of chronic left shoulder and left foot pain which are unchanged. She denies any fevers, chills, chest pain, shortness of breath, abdominal pain, nausea vomiting, diarrhea, constipation, melena, hematochezia, numbness, tingling, focal weakness, headache, vision changes, syncope, or lightheadedness.  LEVEL V CAVEAT DUE TO POOR HISTORIAN AND INABILITY TO [place]  Patient is a 81 y.o. female presenting with weakness. The history is provided by the patient and the EMS personnel. The history is  limited by the absence of a caregiver. No language interpreter was used.  Weakness This is a recurrent problem. The current episode started yesterday. The problem occurs intermittently. The problem has been unchanged. Associated symptoms include arthralgias (chronic L shoulder and L foot pain, unchanged), coughing (with white sputum, occasionally streaked with blood), urinary symptoms and weakness (per nursing home, pt denies). Pertinent negatives include no abdominal pain, chest pain, chills, fever, headaches, myalgias, nausea, numbness, visual change or vomiting. Exacerbated by: not sleeping well. She has tried nothing for the symptoms. The treatment provided no relief.    Past Medical History  Diagnosis Date  . DM (diabetes mellitus) type II controlled peripheral vascular disorder     A1C = 5.8  . HTN (hypertension)   . CHF (congestive heart failure)     EF = 97-67%, grade 2 diastolic dysfunction  . CAD (coronary artery disease)   . COPD (chronic obstructive pulmonary disease)   . Cataracts, bilateral   . Hyperlipidemia   . Peripheral vascular disease bilateral    S/P femoral-popliteal bypass surgery  . Glaucoma   . TIA (transient ischemic attack)   . Shortness of breath   . Pneumonia     HX OF PNA  . Depression   . GERD (gastroesophageal reflux disease)   . Arthritis     RA  . Anemia   . CKD (chronic kidney disease)   . DVT (deep venous thrombosis)   . Cancer     Rt breast  . Hypoxia 06/04/2015   Past Surgical History  Procedure Laterality Date  .  Coronary artery bypass graft    . Spine surgery    . Eye surgery  cataract  . Carpal tunnel release  left  . Breast surgery  lumpectomy right  . Aortogram w/ runoff  06/06/11    right leg  . Above knee leg amputation  07/27/11    Right AKA   Family History  Problem Relation Age of Onset  . Hypertension Mother   . Peripheral vascular disease Mother   . Diabetes Father   . Kidney disease Sister   . Cancer Brother      lung  . Lung disease Sister   . Lung disease Sister   . Kidney disease Daughter     On dialysis, died in 03/03/06   Social History  Substance Use Topics  . Smoking status: Former Smoker -- 1.00 packs/day for 40 years    Types: Cigarettes    Quit date: 04/13/1977  . Smokeless tobacco: Never Used  . Alcohol Use: No   OB History    No data available      LEVEL V CAVEAT DUE TO POOR HISTORIAN AND INABILITY TO REACH NURSING HOME Review of Systems  Constitutional: Negative for fever and chills.  Respiratory: Positive for cough (with white sputum, occasionally streaked with blood). Negative for shortness of breath.   Cardiovascular: Negative for chest pain.  Gastrointestinal: Negative for nausea, vomiting, abdominal pain, diarrhea, constipation and blood in stool.  Genitourinary: Positive for dysuria. Negative for hematuria.  Musculoskeletal: Positive for arthralgias (chronic L shoulder and L foot pain, unchanged). Negative for myalgias.  Skin: Negative for color change.  Allergic/Immunologic: Positive for immunocompromised state (diabetic).  Neurological: Positive for weakness (per nursing home, pt denies). Negative for syncope, light-headedness, numbness and headaches.  Psychiatric/Behavioral: Positive for sleep disturbance. Negative for confusion.   10 Systems reviewed and are negative for acute change except as noted in the HPI.    Allergies  Fish allergy; Celebrex; Codeine; Iodine; and Omnipaque  Home Medications   Prior to Admission medications   Medication Sig Start Date End Date Taking? Authorizing Provider  acetaminophen (TYLENOL) 500 MG tablet Take 500 mg by mouth every 6 (six) hours as needed for mild pain.   Yes Historical Provider, MD  allopurinol (ZYLOPRIM) 100 MG tablet Take 1 tablet (100 mg total) by mouth at bedtime. 05/01/14  Yes Daniel J Angiulli, PA-C  amLODipine (NORVASC) 10 MG tablet Take 1 tablet (10 mg total) by mouth daily. 05/01/14  Yes Daniel J Angiulli, PA-C   aspirin EC 81 MG tablet Take 81 mg by mouth daily.   Yes Historical Provider, MD  b complex-vitamin c-folic acid (NEPHRO-VITE) 0.8 MG TABS tablet Take 1 tablet by mouth every morning.   Yes Historical Provider, MD  carvedilol (COREG) 3.125 MG tablet Take 1 tablet (3.125 mg total) by mouth 2 (two) times daily with a meal. 05/01/14  Yes Daniel J Angiulli, PA-C  citalopram (CELEXA) 20 MG tablet Take 20 mg by mouth every morning.   Yes Historical Provider, MD  cloNIDine (CATAPRES) 0.1 MG tablet Take 0.1 mg by mouth once as needed (if sbp >180 and dbp >110).    Yes Historical Provider, MD  clotrimazole (LOTRIMIN) 1 % external solution Apply 1 application topically 2 (two) times daily as needed (fungal rash on foot).   Yes Historical Provider, MD  ezetimibe (ZETIA) 10 MG tablet Take 1 tablet (10 mg total) by mouth daily. 05/01/14  Yes Daniel J Angiulli, PA-C  famotidine (PEPCID) 20 MG tablet Take 1  tablet (20 mg total) by mouth daily. 05/01/14  Yes Daniel J Angiulli, PA-C  furosemide (LASIX) 80 MG tablet Take 2 tablets (160 mg total) by mouth 2 (two) times daily. 06/11/15  Yes Maryann Mikhail, DO  Glucosamine 500 MG CAPS Take 1,500 mg by mouth 3 (three) times daily.   Yes Historical Provider, MD  hydrALAZINE (APRESOLINE) 100 MG tablet Take 1 tablet (100 mg total) by mouth 3 (three) times daily. 05/01/14  Yes Daniel J Angiulli, PA-C  iron polysaccharides (FERREX 150) 150 MG capsule Take 150 mg by mouth 2 (two) times daily.   Yes Historical Provider, MD  isosorbide mononitrate (IMDUR) 120 MG 24 hr tablet Take 1 tablet (120 mg total) by mouth daily. 05/01/14  Yes Daniel J Angiulli, PA-C  ketoconazole (NIZORAL) 2 % cream Apply 1 application topically daily as needed for irritation.  06/01/15  Yes Historical Provider, MD  loratadine (CLARITIN) 10 MG tablet Take 1 tablet (10 mg total) by mouth daily. 05/01/14  Yes Daniel J Angiulli, PA-C  montelukast (SINGULAIR) 10 MG tablet Take 1 tablet (10 mg total) by mouth at  bedtime. 05/01/14  Yes Daniel J Angiulli, PA-C  polyethylene glycol (MIRALAX / GLYCOLAX) packet Take 17 g by mouth daily as needed for mild constipation.   Yes Historical Provider, MD  sennosides-docusate sodium (SENOKOT-S) 8.6-50 MG tablet Take 1 tablet by mouth daily as needed for constipation.   Yes Historical Provider, MD  timolol (BETIMOL) 0.5 % ophthalmic solution Place 1 drop into both eyes 2 (two) times daily.   Yes Historical Provider, MD  traMADol (ULTRAM) 50 MG tablet Take 1 tablet (50 mg total) by mouth every 12 (twelve) hours as needed (1 to 2 tablets for pain not relieved by acetaminophen). 05/01/14  Yes Daniel J Angiulli, PA-C  triamcinolone cream (KENALOG) 0.1 % Apply 1 application topically 2 (two) times daily as needed (eczema).  06/02/15  Yes Historical Provider, MD   BP 134/60 mmHg  Pulse 59  Temp(Src) 98.4 F (36.9 C) (Oral)  Resp 11  SpO2 95% (on 2L via [place]) Physical Exam  Constitutional: She is oriented to person, place, and time. Vital signs are normal. She appears well-developed and well-nourished.  Non-toxic appearance. No distress.  Afebrile, nontoxic, NAD. Chronically ill-appearing.  HENT:  Head: Normocephalic and atraumatic.  Mouth/Throat: Oropharynx is clear and moist and mucous membranes are normal.  Eyes: Conjunctivae and EOM are normal. Right eye exhibits no discharge. Left eye exhibits no discharge.  Neck: Normal range of motion. Neck supple.  Cardiovascular: Regular rhythm, normal heart sounds and intact distal pulses.  Bradycardia present.  Exam reveals no gallop and no friction rub.   No murmur heard. Mild bradycardia which resolves during exam. Reg rhythm, nl s1/s2, no m/r/g, distal pulses intact (aside from R leg s/p AKA), no pedal edema in L leg  Pulmonary/Chest: Effort normal. No respiratory distress. She has no decreased breath sounds. She has wheezes. She has no rhonchi. She has no rales.  Slight wheeze in b/l lower fields, clears with cough, no rhonchi  or rales, no hypoxia or increased WOB, speaking in full sentences, SpO2 95% on home oxygen of 2L via [place]  Abdominal: Soft. Normal appearance and bowel sounds are normal. She exhibits no distension. There is no tenderness. There is no rigidity, no rebound, no guarding, no CVA tenderness, no tenderness at McBurney's point and negative Murphy's sign.  Musculoskeletal: Normal range of motion.  R AKA MAE x4 (including R AKA stump) Strength and sensation grossly  intact in all extremities Distal pulses intact (absent in R leg due to AKA) No pedal edema of left leg   Neurological: She is alert and oriented to person, place, and time. She has normal strength. No sensory deficit. GCS eye subscore is 4. GCS verbal subscore is 5. GCS motor subscore is 6.  A&O x4 Strength and sensation grossly intact Able to pull herself up in bed No focal neuro deficits  Skin: Skin is warm, dry and intact. No rash noted.  Psychiatric: She has a normal mood and affect.  Nursing note and vitals reviewed.   ED Course  Procedures (including critical care time) Labs Review Labs Reviewed  CBC WITH DIFFERENTIAL/PLATELET - Abnormal; Notable for the following:    Hemoglobin 9.2 (*)    HCT 28.5 (*)    MCV 71.4 (*)    MCH 23.1 (*)    RDW 17.8 (*)    Eosinophils Relative 6 (*)    All other components within normal limits  COMPREHENSIVE METABOLIC PANEL - Abnormal; Notable for the following:    Glucose, Bld 116 (*)    BUN 97 (*)    Creatinine, Ser 3.30 (*)    Albumin 3.3 (*)    ALT 9 (*)    GFR calc non Af Amer 12 (*)    GFR calc Af Amer 14 (*)    All other components within normal limits  URINALYSIS, ROUTINE W REFLEX MICROSCOPIC (NOT AT [facility])  I-STAT TROPOININ, ED  I-STAT CG4 LACTIC ACID, ED    Imaging Review Dg Chest 2 View  06/24/2015   CLINICAL DATA:  c/o weakness. Pt is usually up in her motor wheelchair however today she was not able to get out of bed. Was released from [facility] last week w/ pneumonia, and  released on continual O2. Hx of heart issues.  EXAM: CHEST  2 VIEW  COMPARISON:  06/10/2015  FINDINGS: There stable changes from previous CABG surgery. Cardiac silhouette is mildly enlarged. Normal mediastinal contours. There is prominence of the hila that appears be vascular, similar to the prior exam.  There is bilateral interstitial thickening. There is more coarse reticular opacity in the posterior lung bases on the lateral view, likely atelectasis. No convincing pneumonia. No signet pleural effusion or pneumothorax.  Bony thorax is diffusely demineralized.  IMPRESSION: 1. Findings consistent with mild congestive heart failure, less prominent than noted on the prior study, with mild bilateral interstitial thickening and mild cardiomegaly.   Electronically Signed   By: Lajean Manes M.D.   On: 06/24/2015 19:36   I, Camprubi-Soms, Patty Sermons, personally reviewed and evaluated these images and lab results as part of my medical decision-making.   EKG Interpretation   Date/Time:  Thursday June 24 2015 17:49:03 EDT Ventricular Rate:  56 PR Interval:  212 QRS Duration: 149 QT Interval:  520 QTC Calculation: 502 R Axis:   -47 Text Interpretation:  Sinus rhythm Borderline prolonged PR interval Right  bundle branch block No significant change since last tracing Confirmed by  KNOTT MD, DANIEL ([id number]) on 06/24/2015 5:57:21 PM      MDM   Final diagnoses:  Generalized weakness  Chronic diastolic heart failure  Cough  CKD (chronic kidney disease), stage 3 (moderate)  Dysuria  Chronic anemia    81 y.o. female sent here from [facility] for weakness. Pt difficult historian and several attempts at calling nursing home failed. EMS reporting that NH stated that for 2 days she has not been able to get  up into her wheelchair and has had generalized weakness. Pt denies this to me, states that she isn't sleeping well and in the mornings sometimes doesn't want to get up. States  she feels well now. A&O x4, strength and sensation grossly intact in all extremities (pt s/p R AKA). Only complaint is dysuria and ongoing cough with occasional blood tinged which sputum. Denies CP/SOB. Denies neuro symptoms, no focal neuro deficits. Able to pull herself up in bed. EKG unchanged from prior. Will get screening labs. Will reassess shortly.   7:55 PM Lactic acid WNL, trop neg, EKG without changes from prior, CBC w/diff showing chronic stable microcytic hypochromic anemia. CMP with Cr 3.3 which is close to her baseline of ~2.9. BUN at baseline. CXR with mild CHF changes which are stable when compared to prior CXRs. After talking with pt's daughter, she informs me that yesterday pt was up and going all day and that she likely just got tired out from the activity. States that pt has been uninterested in PT/OT. Agrees with the plan of assessing if anything comes up on U/A and if clear then d/c home with f/up with PCP which pt's daughter is already working on. Dr. Laneta Simmers will follow up on pt's U/A. Please see his note for further documentation.   BP 134/60 mmHg  Pulse 59  Temp(Src) 98.4 F (36.9 C) (Oral)  Resp 11  SpO2 95%    Riona Lahti Camprubi-Soms, PA-C 06/24/15 2009  Leo Grosser, MD 06/25/15 0030

## 2015-06-24 NOTE — ED Notes (Signed)
Per EMS pt from [facility] w/ c/o weakness.  Pt is usually up in her motor wheelchair however today she was not able to get out of bed.  Was released from [facility] last week w/ pneumonia, and released on continual O2.  Hx of heart issues.

## 2015-06-24 NOTE — Discharge Instructions (Signed)
INSTRUCTIONS FOR NURSING HOME: Please discontinue Coreg (carvedilol) as this was supposed to be discontinued after her last admission.   Please follow up with your regular doctor in 1 week for recheck of ongoing symptoms. Return to the ER for changes or worsening symptoms.

## 2015-06-24 NOTE — ED Notes (Signed)
Spoke to granddaughter and gave update.

## 2015-06-24 NOTE — ED Notes (Signed)
Daughter left phone # [phone], wishes to be contacted in case of admission to hospital

## 2015-08-02 IMAGING — CR DG CHEST 1V PORT
1 series · 1 of 1 positions shown · non-contrast
Comparison: Portable chest x-ray June 04, 2015

CLINICAL DATA: Cough, CHF, history of CABG.

EXAM:
PORTABLE CHEST - 1 VIEW

[AP]
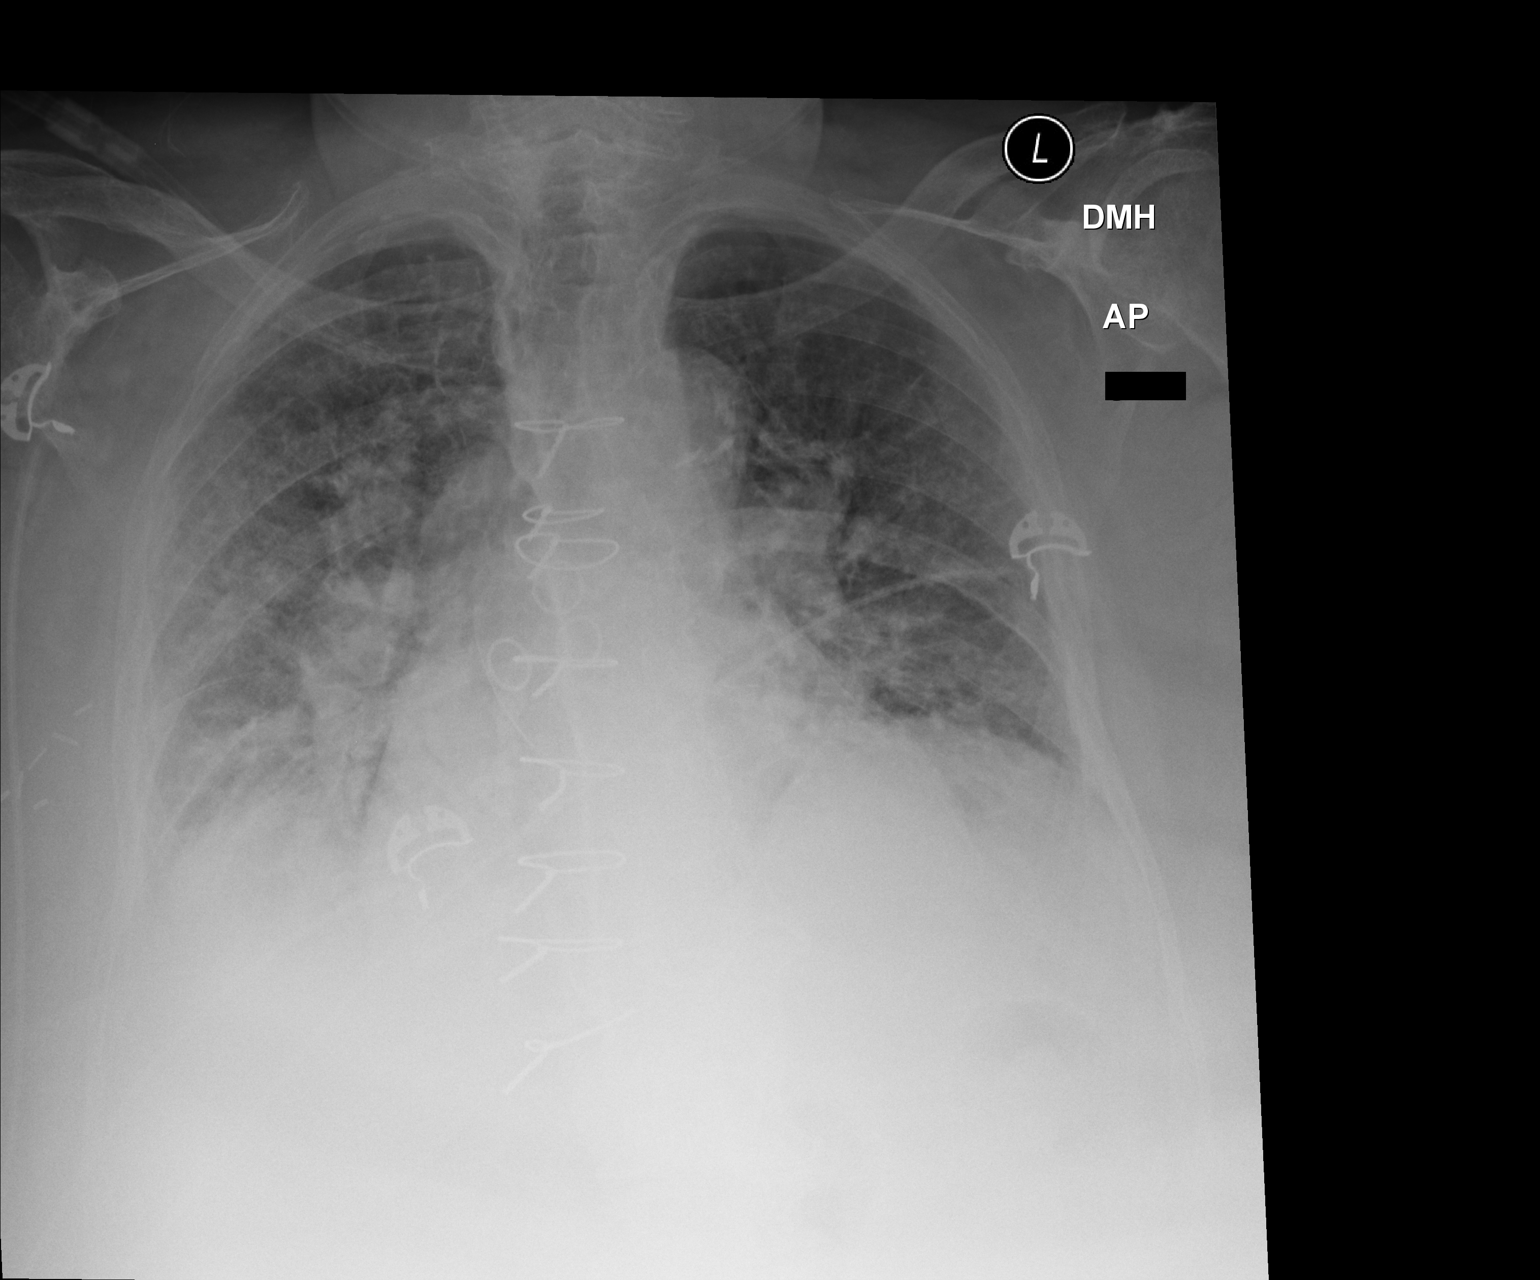

[1 of 1 positions shown; findings below may reference images not displayed]

FINDINGS: The lungs are less well inflated today. The pulmonary interstitial
markings remain increased and there are areas of confluent alveolar
opacity bilaterally. The costophrenic angles are obscured. The
cardiac silhouette is enlarged. The pulmonary vascularity is
engorged and there is mild cephalization. There are post CABG
changes.
IMPRESSION: CHF with pulmonary interstitial and alveolar edema. There has been
deterioration in the appearance of the pulmonary interstitium since
the previous study.

## 2015-08-16 ENCOUNTER — Emergency Department (HOSPITAL_COMMUNITY): Payer: Medicare Other

## 2015-08-16 ENCOUNTER — Inpatient Hospital Stay (HOSPITAL_COMMUNITY)
Admission: EM | Admit: 2015-08-16 | Discharge: 2015-08-20 | DRG: 682 | Disposition: A | Discharge status: Hospice | Payer: Medicare Other | Attending: Internal Medicine | Admitting: Internal Medicine

## 2015-08-16 ENCOUNTER — Inpatient Hospital Stay (HOSPITAL_COMMUNITY): Payer: Medicare Other

## 2015-08-16 ENCOUNTER — Encounter (HOSPITAL_COMMUNITY): Payer: Self-pay | Admitting: Emergency Medicine

## 2015-08-16 DIAGNOSIS — Z89619 Acquired absence of unspecified leg above knee: Secondary | ICD-10-CM

## 2015-08-16 DIAGNOSIS — E1122 Type 2 diabetes mellitus with diabetic chronic kidney disease: Secondary | ICD-10-CM | POA: Diagnosis present

## 2015-08-16 DIAGNOSIS — Z7189 Other specified counseling: Secondary | ICD-10-CM | POA: Diagnosis not present

## 2015-08-16 DIAGNOSIS — Z888 Allergy status to other drugs, medicaments and biological substances status: Secondary | ICD-10-CM | POA: Diagnosis not present

## 2015-08-16 DIAGNOSIS — I5033 Acute on chronic diastolic (congestive) heart failure: Secondary | ICD-10-CM | POA: Diagnosis present

## 2015-08-16 DIAGNOSIS — Z791 Long term (current) use of non-steroidal anti-inflammatories (NSAID): Secondary | ICD-10-CM

## 2015-08-16 DIAGNOSIS — N390 Urinary tract infection, site not specified: Secondary | ICD-10-CM | POA: Diagnosis present

## 2015-08-16 DIAGNOSIS — Z951 Presence of aortocoronary bypass graft: Secondary | ICD-10-CM

## 2015-08-16 DIAGNOSIS — J449 Chronic obstructive pulmonary disease, unspecified: Secondary | ICD-10-CM | POA: Diagnosis present

## 2015-08-16 DIAGNOSIS — G9340 Encephalopathy, unspecified: Secondary | ICD-10-CM | POA: Diagnosis present

## 2015-08-16 DIAGNOSIS — Z89611 Acquired absence of right leg above knee: Secondary | ICD-10-CM | POA: Diagnosis not present

## 2015-08-16 DIAGNOSIS — K219 Gastro-esophageal reflux disease without esophagitis: Secondary | ICD-10-CM | POA: Diagnosis present

## 2015-08-16 DIAGNOSIS — I132 Hypertensive heart and chronic kidney disease with heart failure and with stage 5 chronic kidney disease, or end stage renal disease: Secondary | ICD-10-CM | POA: Diagnosis present

## 2015-08-16 DIAGNOSIS — N179 Acute kidney failure, unspecified: Secondary | ICD-10-CM | POA: Diagnosis present

## 2015-08-16 DIAGNOSIS — Z8249 Family history of ischemic heart disease and other diseases of the circulatory system: Secondary | ICD-10-CM | POA: Diagnosis not present

## 2015-08-16 DIAGNOSIS — N189 Chronic kidney disease, unspecified: Secondary | ICD-10-CM | POA: Diagnosis not present

## 2015-08-16 DIAGNOSIS — R531 Weakness: Secondary | ICD-10-CM | POA: Diagnosis not present

## 2015-08-16 DIAGNOSIS — H409 Unspecified glaucoma: Secondary | ICD-10-CM | POA: Diagnosis present

## 2015-08-16 DIAGNOSIS — Z6827 Body mass index (BMI) 27.0-27.9, adult: Secondary | ICD-10-CM

## 2015-08-16 DIAGNOSIS — Z79899 Other long term (current) drug therapy: Secondary | ICD-10-CM

## 2015-08-16 DIAGNOSIS — Z515 Encounter for palliative care: Secondary | ICD-10-CM | POA: Diagnosis not present

## 2015-08-16 DIAGNOSIS — E872 Acidosis: Secondary | ICD-10-CM | POA: Diagnosis present

## 2015-08-16 DIAGNOSIS — Z833 Family history of diabetes mellitus: Secondary | ICD-10-CM

## 2015-08-16 DIAGNOSIS — E860 Dehydration: Secondary | ICD-10-CM | POA: Diagnosis present

## 2015-08-16 DIAGNOSIS — R627 Adult failure to thrive: Secondary | ICD-10-CM | POA: Diagnosis present

## 2015-08-16 DIAGNOSIS — E1151 Type 2 diabetes mellitus with diabetic peripheral angiopathy without gangrene: Secondary | ICD-10-CM | POA: Diagnosis present

## 2015-08-16 DIAGNOSIS — R52 Pain, unspecified: Secondary | ICD-10-CM | POA: Diagnosis not present

## 2015-08-16 DIAGNOSIS — Z801 Family history of malignant neoplasm of trachea, bronchus and lung: Secondary | ICD-10-CM | POA: Diagnosis not present

## 2015-08-16 DIAGNOSIS — Z87891 Personal history of nicotine dependence: Secondary | ICD-10-CM | POA: Diagnosis not present

## 2015-08-16 DIAGNOSIS — G8929 Other chronic pain: Secondary | ICD-10-CM | POA: Diagnosis present

## 2015-08-16 DIAGNOSIS — E785 Hyperlipidemia, unspecified: Secondary | ICD-10-CM | POA: Diagnosis present

## 2015-08-16 DIAGNOSIS — Z7982 Long term (current) use of aspirin: Secondary | ICD-10-CM

## 2015-08-16 DIAGNOSIS — F329 Major depressive disorder, single episode, unspecified: Secondary | ICD-10-CM | POA: Diagnosis present

## 2015-08-16 DIAGNOSIS — E876 Hypokalemia: Secondary | ICD-10-CM | POA: Diagnosis present

## 2015-08-16 DIAGNOSIS — Z836 Family history of other diseases of the respiratory system: Secondary | ICD-10-CM | POA: Diagnosis not present

## 2015-08-16 DIAGNOSIS — I10 Essential (primary) hypertension: Secondary | ICD-10-CM | POA: Diagnosis present

## 2015-08-16 DIAGNOSIS — Z66 Do not resuscitate: Secondary | ICD-10-CM | POA: Diagnosis present

## 2015-08-16 DIAGNOSIS — Z91013 Allergy to seafood: Secondary | ICD-10-CM | POA: Diagnosis not present

## 2015-08-16 DIAGNOSIS — D631 Anemia in chronic kidney disease: Secondary | ICD-10-CM | POA: Diagnosis present

## 2015-08-16 DIAGNOSIS — Z885 Allergy status to narcotic agent status: Secondary | ICD-10-CM | POA: Diagnosis not present

## 2015-08-16 DIAGNOSIS — E1159 Type 2 diabetes mellitus with other circulatory complications: Secondary | ICD-10-CM | POA: Diagnosis present

## 2015-08-16 DIAGNOSIS — Z8673 Personal history of transient ischemic attack (TIA), and cerebral infarction without residual deficits: Secondary | ICD-10-CM

## 2015-08-16 DIAGNOSIS — I2510 Atherosclerotic heart disease of native coronary artery without angina pectoris: Secondary | ICD-10-CM | POA: Diagnosis present

## 2015-08-16 DIAGNOSIS — E440 Moderate protein-calorie malnutrition: Secondary | ICD-10-CM | POA: Diagnosis present

## 2015-08-16 DIAGNOSIS — N185 Chronic kidney disease, stage 5: Secondary | ICD-10-CM | POA: Diagnosis present

## 2015-08-16 DIAGNOSIS — Z91041 Radiographic dye allergy status: Secondary | ICD-10-CM | POA: Diagnosis not present

## 2015-08-16 DIAGNOSIS — Z8701 Personal history of pneumonia (recurrent): Secondary | ICD-10-CM | POA: Diagnosis not present

## 2015-08-16 DIAGNOSIS — Z841 Family history of disorders of kidney and ureter: Secondary | ICD-10-CM

## 2015-08-16 DIAGNOSIS — D72829 Elevated white blood cell count, unspecified: Secondary | ICD-10-CM | POA: Diagnosis present

## 2015-08-16 LAB — CBC WITH DIFFERENTIAL/PLATELET
BASOS ABS: 0 10*3/uL (ref 0.0–0.1)
BASOS PCT: 0 %
Eosinophils Absolute: 0.2 10*3/uL (ref 0.0–0.7)
Eosinophils Relative: 1 %
HEMATOCRIT: 29.3 % — AB (ref 36.0–46.0)
Hemoglobin: 9.8 g/dL — ABNORMAL LOW (ref 12.0–15.0)
LYMPHS ABS: 0.9 10*3/uL (ref 0.7–4.0)
Lymphocytes Relative: 4 %
MCH: 23 pg — AB (ref 26.0–34.0)
MCHC: 33.4 g/dL (ref 30.0–36.0)
MCV: 68.8 fL — ABNORMAL LOW (ref 78.0–100.0)
Monocytes Absolute: 0.7 10*3/uL (ref 0.1–1.0)
Monocytes Relative: 3 %
Neutro Abs: 21.7 10*3/uL — ABNORMAL HIGH (ref 1.7–7.7)
Neutrophils Relative %: 92 %
Platelets: 327 10*3/uL (ref 150–400)
RBC: 4.26 MIL/uL (ref 3.87–5.11)
RDW: 17.4 % — AB (ref 11.5–15.5)
WBC: 23.5 10*3/uL — ABNORMAL HIGH (ref 4.0–10.5)

## 2015-08-16 LAB — COMPREHENSIVE METABOLIC PANEL
ALBUMIN: 3 g/dL — AB (ref 3.5–5.0)
ALK PHOS: 91 U/L (ref 38–126)
ALT: 19 U/L (ref 14–54)
AST: 32 U/L (ref 15–41)
Anion gap: 18 — ABNORMAL HIGH (ref 5–15)
BILIRUBIN TOTAL: 0.7 mg/dL (ref 0.3–1.2)
BUN: 168 mg/dL — AB (ref 6–20)
CALCIUM: 8.8 mg/dL — AB (ref 8.9–10.3)
CO2: 15 mmol/L — AB (ref 22–32)
Chloride: 106 mmol/L (ref 101–111)
Creatinine, Ser: 7.52 mg/dL — ABNORMAL HIGH (ref 0.44–1.00)
GFR calc Af Amer: 5 mL/min — ABNORMAL LOW (ref >60)
GFR calc non Af Amer: 4 mL/min — ABNORMAL LOW (ref >60)
GLUCOSE: 195 mg/dL — AB (ref 65–99)
Potassium: 3.1 mmol/L — ABNORMAL LOW (ref 3.5–5.1)
Sodium: 139 mmol/L (ref 135–145)
TOTAL PROTEIN: 7.2 g/dL (ref 6.5–8.1)

## 2015-08-16 LAB — URINALYSIS, ROUTINE W REFLEX MICROSCOPIC
Bilirubin Urine: NEGATIVE
GLUCOSE, UA: NEGATIVE mg/dL
HGB URINE DIPSTICK: NEGATIVE
Ketones, ur: NEGATIVE mg/dL
Nitrite: NEGATIVE
PH: 5 (ref 5.0–8.0)
Protein, ur: 30 mg/dL — AB
Specific Gravity, Urine: 1.014 (ref 1.005–1.030)
UROBILINOGEN UA: 0.2 mg/dL (ref 0.0–1.0)

## 2015-08-16 LAB — URINE MICROSCOPIC-ADD ON

## 2015-08-16 LAB — MRSA PCR SCREENING: MRSA BY PCR: POSITIVE — AB

## 2015-08-16 IMAGING — DX DG CHEST 2V
2 series · 2 of 2 positions shown · non-contrast
Comparison: 06/10/2015

CLINICAL DATA: c/o weakness. Pt is usually up in her motor
wheelchair however today she was not able to get out of bed. Was
released [REDACTED] last week w/ pneumonia, and released on continual
O2. Hx of heart issues.

EXAM:
CHEST  2 VIEW

[chest lat]
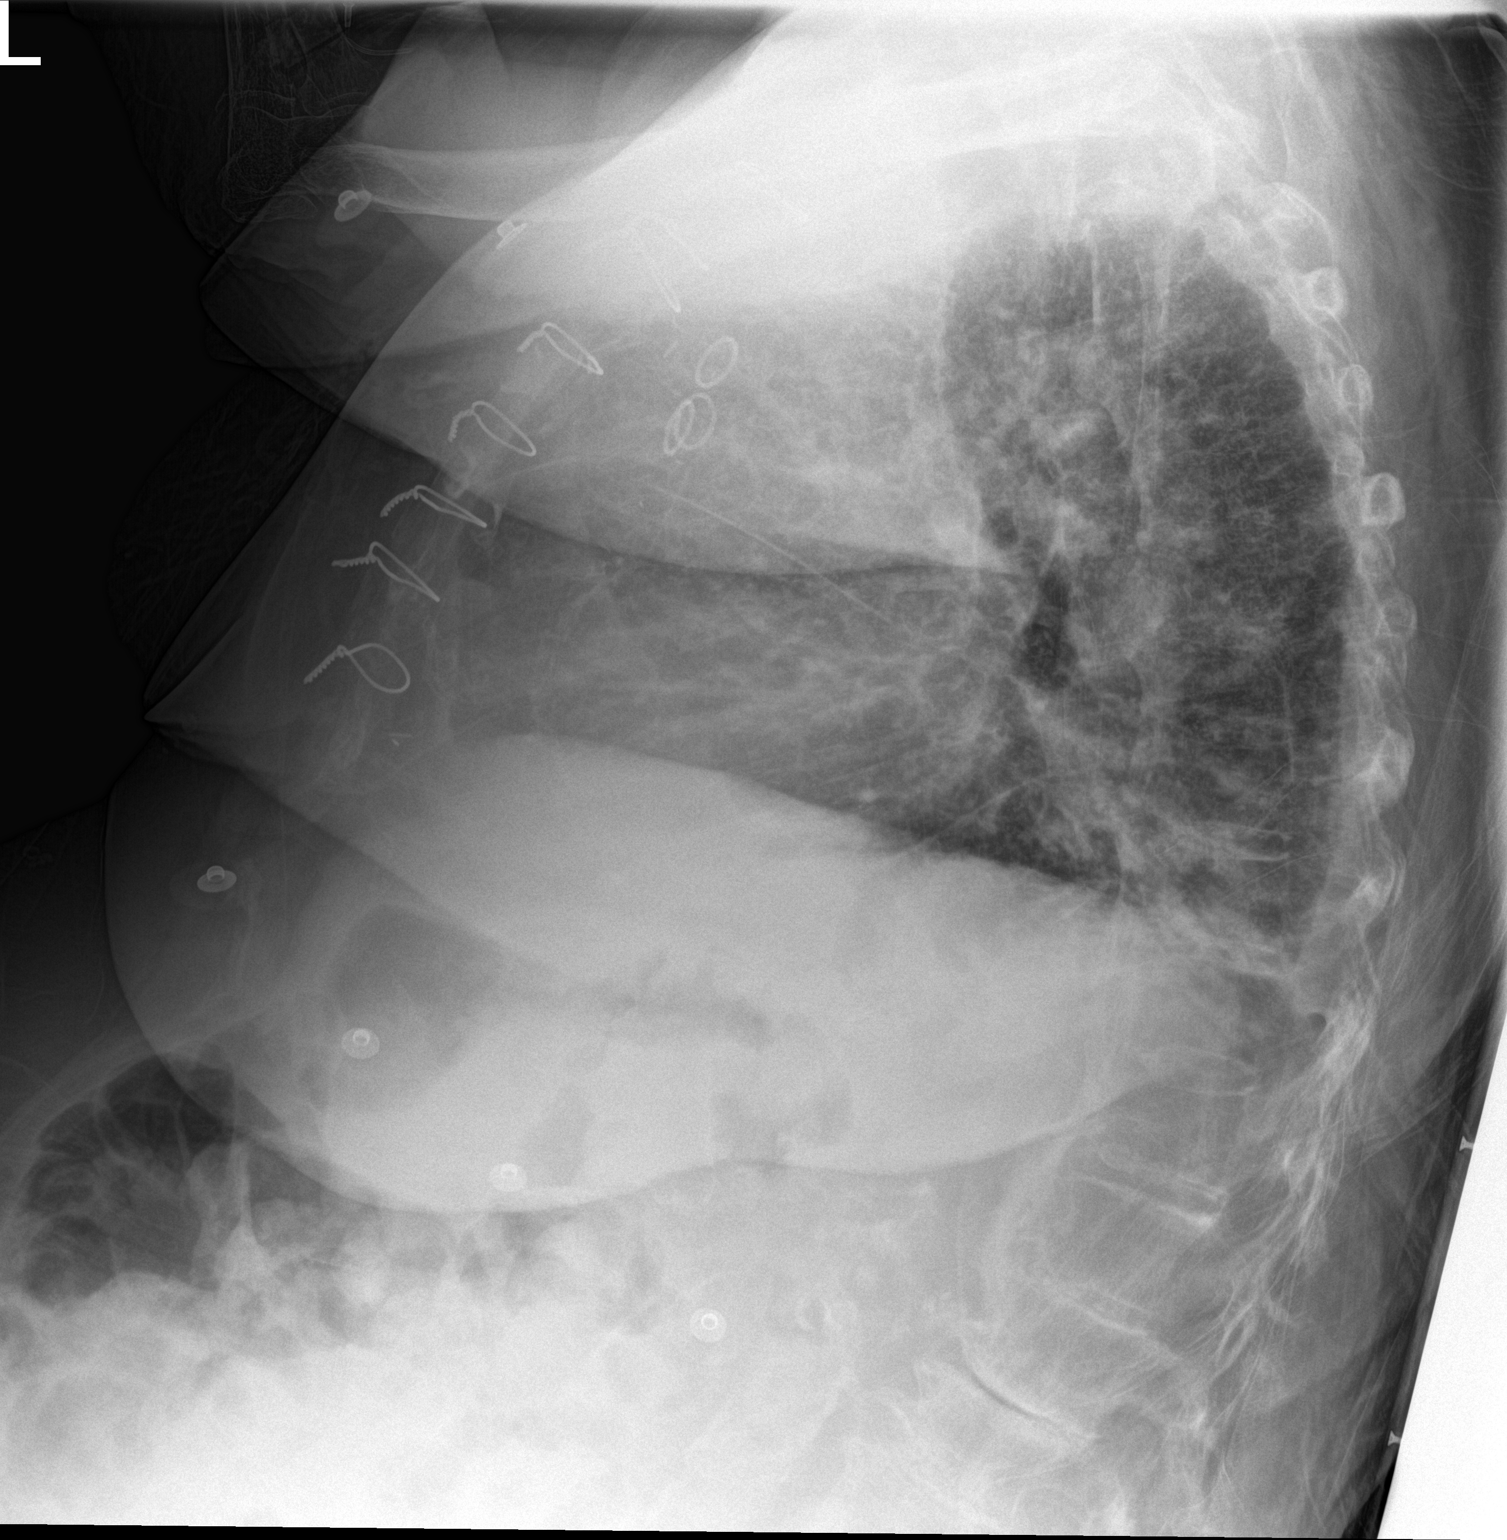

[chest ap]
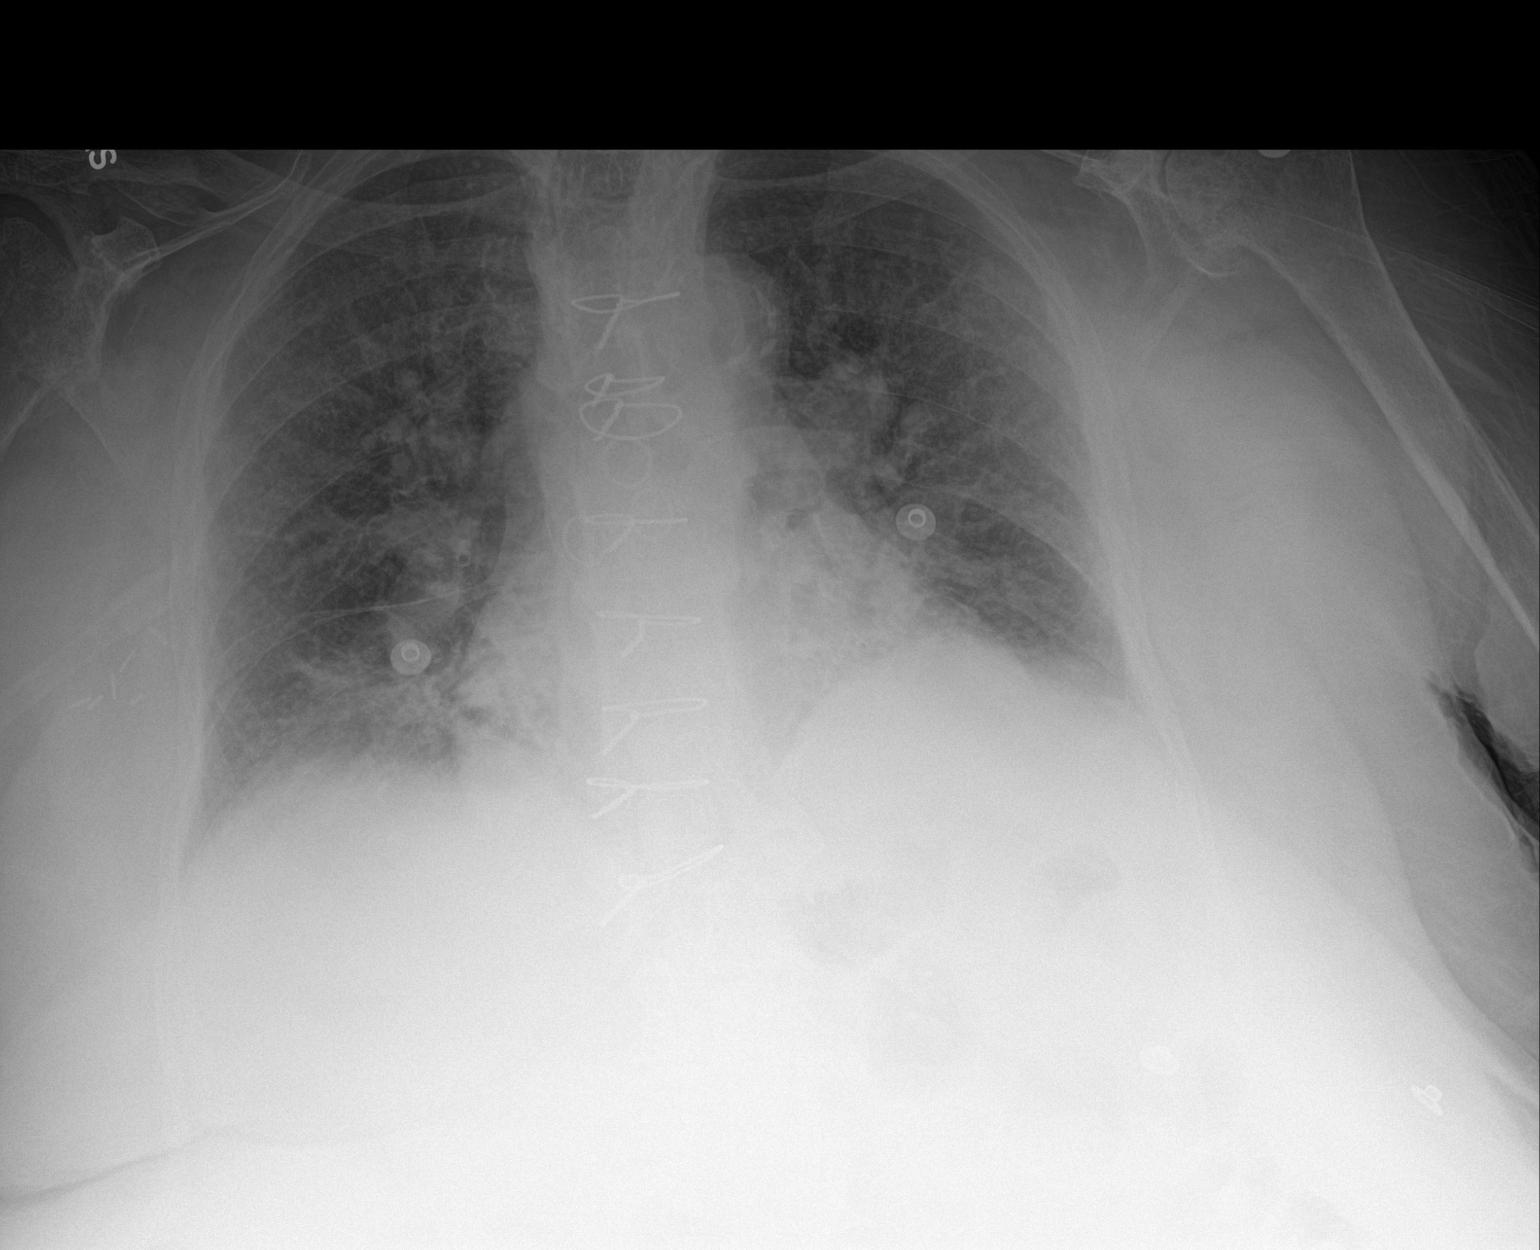

[2 of 2 positions shown; findings below may reference images not displayed]

FINDINGS: There stable changes from previous CABG surgery. Cardiac silhouette
is mildly enlarged. Normal mediastinal contours. There is prominence
of the hila that appears be vascular, similar to the prior exam.

There is bilateral interstitial thickening. There is more coarse
reticular opacity in the posterior lung bases on the lateral view,
likely atelectasis. No convincing pneumonia. No signet pleural
effusion or pneumothorax.

Bony thorax is diffusely demineralized.
IMPRESSION: 1. Findings consistent with mild congestive heart failure, less
prominent than noted on the prior study, with mild bilateral
interstitial thickening and mild cardiomegaly.

## 2015-08-16 MED ORDER — EZETIMIBE 10 MG PO TABS
10.0000 mg | ORAL_TABLET | Freq: Every day | ORAL | Status: DC
  Administered 2015-08-17: 10 mg via ORAL
  Filled 2015-08-16 (×2): qty 1

## 2015-08-16 MED ORDER — POLYSACCHARIDE IRON COMPLEX 150 MG PO CAPS
150.0000 mg | ORAL_CAPSULE | Freq: Two times a day (BID) | ORAL | Status: DC
  Administered 2015-08-17: 150 mg via ORAL
  Filled 2015-08-16 (×3): qty 1

## 2015-08-16 MED ORDER — AMLODIPINE BESYLATE 10 MG PO TABS
10.0000 mg | ORAL_TABLET | Freq: Every day | ORAL | Status: DC
  Administered 2015-08-17 – 2015-08-20 (×3): 10 mg via ORAL
  Filled 2015-08-16 (×4): qty 1

## 2015-08-16 MED ORDER — ACETAMINOPHEN 325 MG PO TABS: 650.0000 mg | ORAL_TABLET | Freq: Three times a day (TID) | ORAL | Status: DC | PRN

## 2015-08-16 MED ORDER — ONDANSETRON HCL 4 MG/2ML IJ SOLN: 4.0000 mg | Freq: Three times a day (TID) | INTRAMUSCULAR | Status: DC | PRN

## 2015-08-16 MED ORDER — ONDANSETRON HCL 4 MG PO TABS: 4.0000 mg | ORAL_TABLET | Freq: Four times a day (QID) | ORAL | Status: DC | PRN

## 2015-08-16 MED ORDER — CITALOPRAM HYDROBROMIDE 20 MG PO TABS
20.0000 mg | ORAL_TABLET | Freq: Every morning | ORAL | Status: DC
  Administered 2015-08-17: 20 mg via ORAL
  Filled 2015-08-16 (×2): qty 1

## 2015-08-16 MED ORDER — POLYETHYLENE GLYCOL 3350 17 G PO PACK: 17.0000 g | PACK | Freq: Every day | ORAL | Status: DC | PRN

## 2015-08-16 MED ORDER — INSULIN ASPART 100 UNIT/ML ~~LOC~~ SOLN
0.0000 [IU] | Freq: Three times a day (TID) | SUBCUTANEOUS | Status: DC
  Administered 2015-08-17: 2 [IU] via SUBCUTANEOUS
  Administered 2015-08-17: 1 [IU] via SUBCUTANEOUS
  Administered 2015-08-17: 2 [IU] via SUBCUTANEOUS

## 2015-08-16 MED ORDER — SODIUM CHLORIDE 0.9 % IV SOLN
INTRAVENOUS | Status: AC
  Administered 2015-08-17: via INTRAVENOUS

## 2015-08-16 MED ORDER — ASPIRIN EC 81 MG PO TBEC
81.0000 mg | DELAYED_RELEASE_TABLET | Freq: Every day | ORAL | Status: DC
  Administered 2015-08-17: 81 mg via ORAL
  Filled 2015-08-16 (×2): qty 1

## 2015-08-16 MED ORDER — HEPARIN SODIUM (PORCINE) 5000 UNIT/ML IJ SOLN
5000.0000 [IU] | Freq: Three times a day (TID) | INTRAMUSCULAR | Status: DC
  Administered 2015-08-17 – 2015-08-18 (×5): 5000 [IU] via SUBCUTANEOUS
  Filled 2015-08-16 (×4): qty 1

## 2015-08-16 MED ORDER — MONTELUKAST SODIUM 10 MG PO TABS
10.0000 mg | ORAL_TABLET | Freq: Every day | ORAL | Status: DC
  Administered 2015-08-17 (×2): 10 mg via ORAL
  Filled 2015-08-16 (×2): qty 1

## 2015-08-16 MED ORDER — HYDRALAZINE HCL 50 MG PO TABS
100.0000 mg | ORAL_TABLET | Freq: Three times a day (TID) | ORAL | Status: DC
  Administered 2015-08-17 (×4): 100 mg via ORAL
  Filled 2015-08-16 (×6): qty 2

## 2015-08-16 MED ORDER — ONDANSETRON HCL 4 MG/2ML IJ SOLN: 4.0000 mg | Freq: Four times a day (QID) | INTRAMUSCULAR | Status: DC | PRN

## 2015-08-16 MED ORDER — ENOXAPARIN SODIUM 30 MG/0.3ML ~~LOC~~ SOLN: 30.0000 mg | SUBCUTANEOUS | Status: DC

## 2015-08-16 MED ORDER — FAMOTIDINE 20 MG PO TABS
20.0000 mg | ORAL_TABLET | Freq: Every day | ORAL | Status: DC
  Administered 2015-08-17: 20 mg via ORAL
  Filled 2015-08-16 (×2): qty 1

## 2015-08-16 MED ORDER — ISOSORBIDE MONONITRATE ER 30 MG PO TB24
120.0000 mg | ORAL_TABLET | Freq: Every day | ORAL | Status: DC
  Administered 2015-08-17 – 2015-08-20 (×3): 120 mg via ORAL
  Filled 2015-08-16 (×4): qty 4

## 2015-08-16 MED ORDER — SODIUM CHLORIDE 0.9 % IV SOLN
Freq: Once | INTRAVENOUS | Status: AC
Start: 2015-08-16 — End: 2015-08-16
  Administered 2015-08-16: 19:00:00 via INTRAVENOUS

## 2015-08-16 MED ORDER — TIMOLOL MALEATE 0.5 % OP SOLN
1.0000 [drp] | Freq: Two times a day (BID) | OPHTHALMIC | Status: DC
  Administered 2015-08-17 – 2015-08-20 (×8): 1 [drp] via OPHTHALMIC
  Filled 2015-08-16: qty 5

## 2015-08-16 MED ORDER — PROMETHAZINE HCL 25 MG PO TABS: 25.0000 mg | ORAL_TABLET | Freq: Four times a day (QID) | ORAL | Status: DC | PRN

## 2015-08-16 MED ORDER — ACETAMINOPHEN 650 MG RE SUPP: 650.0000 mg | Freq: Three times a day (TID) | RECTAL | Status: DC | PRN

## 2015-08-16 MED ORDER — DEXTROSE 5 % IV SOLN
1.0000 g | Freq: Once | INTRAVENOUS | Status: AC
  Administered 2015-08-16: 1 g via INTRAVENOUS
  Filled 2015-08-16: qty 10

## 2015-08-16 MED ORDER — CEFTRIAXONE SODIUM 1 G IJ SOLR
1.0000 g | INTRAMUSCULAR | Status: DC
  Administered 2015-08-17: 1 g via INTRAVENOUS
  Filled 2015-08-16 (×2): qty 10

## 2015-08-16 MED ORDER — LORATADINE 10 MG PO TABS
10.0000 mg | ORAL_TABLET | ORAL | Status: DC
  Filled 2015-08-16: qty 1

## 2015-08-16 MED ORDER — ALLOPURINOL 100 MG PO TABS
100.0000 mg | ORAL_TABLET | Freq: Every day | ORAL | Status: DC
  Administered 2015-08-17 (×2): 100 mg via ORAL
  Filled 2015-08-16 (×2): qty 1

## 2015-08-16 MED ORDER — TRAMADOL HCL 50 MG PO TABS: 50.0000 mg | ORAL_TABLET | Freq: Two times a day (BID) | ORAL | Status: DC | PRN

## 2015-08-16 MED ORDER — RENA-VITE PO TABS
1.0000 | ORAL_TABLET | Freq: Every day | ORAL | Status: DC
  Administered 2015-08-17: 1 via ORAL
  Filled 2015-08-16 (×2): qty 1

## 2015-08-16 NOTE — ED Notes (Signed)
Pt is from [place].  Staff states that she has had increasing weakness over the last 3 days.  BUN and CRT have increased (done at facility today).  Pt c/o nausea this am, but not now.  Pt has pain on sacrum.  Pt is a right BKA.  Pt is chronic O2 user at 2L via [place] and has a non productive cough that started today.  98-99% on 2L  BP 140/70  P 84  CBG 238

## 2015-08-16 NOTE — H&P (Addendum)
[facility] History and Physical  [name]:[id number] DOB: [birth year] DOA: 08/16/2015  Referring physician: Dr.Miller. PCP: Simona Huh, MD  Specialists: Dr.Webb. Nephrologist.  Chief Complaint: Weakness and abnormal labs.  History obtained from patient's daughter.  HPI: [name] is a 81 y.o. female with history of diabetes mellitus on diet, chronic kidney disease stage IV to 5, hypertension, chronic diastolic CHF grade 3 who was recently admitted in July for decompressive CHF and patient is on large dose of Lasix was found to have increasing weakness over the last 2 weeks. Patient's daughter states that patient has been hardly eating anything over the last 1 week. Patient has been having some nausea but denies any vomiting or diarrhea. No new medications were added. Patient had gone to her PCP and was found to have elevated creatinine from her baseline and was referred to the ER. On exam patient is not in distress. Patient has chronic left shoulder pain. Patient states she had some chest pain one week ago but denies any pain now. Denies any shortness of breath.  Review of Systems: As presented in the history of presenting illness, rest negative.  Past Medical History  Diagnosis Date  . DM (diabetes mellitus) type II controlled peripheral vascular disorder (HCC)     A1C = 5.8  . HTN (hypertension)   . CHF (congestive heart failure) (HCC)     EF = 57-84%, grade 2 diastolic dysfunction  . CAD (coronary artery disease)   . COPD (chronic obstructive pulmonary disease) ([place])   . Cataracts, bilateral   . Hyperlipidemia   . Peripheral vascular disease ([place]) bilateral    S/P femoral-popliteal bypass surgery  . Glaucoma   . TIA (transient ischemic attack)   . Shortness of breath   . Pneumonia     HX OF PNA  . Depression   . GERD (gastroesophageal reflux disease)   . Arthritis     RA  . Anemia   . CKD (chronic kidney disease)   . DVT (deep venous thrombosis)  ([place])   . Cancer ([place])     Rt breast  . Hypoxia 06/04/2015   Past Surgical History  Procedure Laterality Date  . Coronary artery bypass graft    . Spine surgery    . Eye surgery  cataract  . Carpal tunnel release  left  . Breast surgery  lumpectomy right  . Aortogram w/ runoff  06/06/11    right leg  . Above knee leg amputation  07/27/11    Right AKA   Social History:  reports that she quit smoking about 38 years ago. Her smoking use included Cigarettes. She has a 40 pack-year smoking history. She has never used smokeless tobacco. She reports that she does not drink alcohol or use illicit drugs. Where does patient live home. Can patient participate in ADLs? Yes.  Allergies  Allergen Reactions  . Crestor [Rosuvastatin Calcium] Other (See Comments)    On [place]  . Fish Allergy     gout  . Lipitor [Atorvastatin] Other (See Comments)    On [place]  . Zocor [Simvastatin] Other (See Comments)    On [place]  . Celebrex [Celecoxib] Rash  . Codeine Other (See Comments)    REACTION: unknown  . Iodine Rash  . Omnipaque [Iohexol] Other (See Comments)    REACTION: unknown    Family History:  Family History  Problem Relation Age of Onset  . Hypertension Mother   . Peripheral vascular disease Mother   .  Diabetes Father   . Kidney disease Sister   . Cancer Brother     lung  . Lung disease Sister   . Lung disease Sister   . Kidney disease Daughter     On dialysis, died in 02/24/06      Prior to Admission medications   Medication Sig Start Date End Date Taking? Authorizing Provider  acetaminophen (TYLENOL) 500 MG tablet Take 500 mg by mouth every 6 (six) hours as needed for mild pain.   Yes Historical Provider, MD  allopurinol (ZYLOPRIM) 100 MG tablet Take 1 tablet (100 mg total) by mouth at bedtime. 05/01/14  Yes Daniel J Angiulli, PA-C  amLODipine (NORVASC) 10 MG tablet Take 1 tablet (10 mg total) by mouth daily. 05/01/14  Yes Daniel J Angiulli, PA-C  aspirin  EC 81 MG tablet Take 81 mg by mouth daily.   Yes Historical Provider, MD  b complex-vitamin c-folic acid (NEPHRO-VITE) 0.8 MG TABS tablet Take 1 tablet by mouth every morning.   Yes Historical Provider, MD  citalopram (CELEXA) 20 MG tablet Take 20 mg by mouth every morning.   Yes Historical Provider, MD  cloNIDine (CATAPRES) 0.1 MG tablet Take 0.1 mg by mouth once as needed (if sbp >180 and dbp >110).    Yes Historical Provider, MD  clotrimazole (LOTRIMIN) 1 % external solution Apply 1 application topically 2 (two) times daily as needed (fungal rash on foot).   Yes Historical Provider, MD  ezetimibe (ZETIA) 10 MG tablet Take 1 tablet (10 mg total) by mouth daily. 05/01/14  Yes Daniel J Angiulli, PA-C  famotidine (PEPCID) 20 MG tablet Take 1 tablet (20 mg total) by mouth daily. 05/01/14  Yes Daniel J Angiulli, PA-C  furosemide (LASIX) 80 MG tablet Take 2 tablets (160 mg total) by mouth 2 (two) times daily. 06/11/15  Yes Maryann Mikhail, DO  Glucosamine 500 MG CAPS Take 1,500 mg by mouth 3 (three) times daily.   Yes Historical Provider, MD  hydrALAZINE (APRESOLINE) 100 MG tablet Take 1 tablet (100 mg total) by mouth 3 (three) times daily. 05/01/14  Yes Daniel J Angiulli, PA-C  iron polysaccharides (FERREX 150) 150 MG capsule Take 150 mg by mouth 2 (two) times daily.   Yes Historical Provider, MD  isosorbide mononitrate (IMDUR) 120 MG 24 hr tablet Take 1 tablet (120 mg total) by mouth daily. 05/01/14  Yes Daniel J Angiulli, PA-C  ketoconazole (NIZORAL) 2 % cream Apply 1 application topically daily as needed for irritation.  06/01/15  Yes Historical Provider, MD  loratadine (CLARITIN) 10 MG tablet Take 1 tablet (10 mg total) by mouth daily. 05/01/14  Yes Daniel J Angiulli, PA-C  montelukast (SINGULAIR) 10 MG tablet Take 1 tablet (10 mg total) by mouth at bedtime. 05/01/14  Yes Daniel J Angiulli, PA-C  polyethylene glycol (MIRALAX / GLYCOLAX) packet Take 17 g by mouth daily as needed for mild constipation.   Yes  Historical Provider, MD  promethazine (PHENERGAN) 25 MG tablet Take 25 mg by mouth every 6 (six) hours as needed for nausea.   Yes Historical Provider, MD  sennosides-docusate sodium (SENOKOT-S) 8.6-50 MG tablet Take 1 tablet by mouth daily as needed for constipation.   Yes Historical Provider, MD  Skin Protectants, Misc. (BAZA PROTECT EX) Apply 1 application topically 2 (two) times daily. Apply to buttocks topically 2 times daily   Yes Historical Provider, MD  timolol (BETIMOL) 0.5 % ophthalmic solution Place 1 drop into both eyes 2 (two) times daily.   Yes Historical  Provider, MD  traMADol (ULTRAM) 50 MG tablet Take 1 tablet (50 mg total) by mouth every 12 (twelve) hours as needed (1 to 2 tablets for pain not relieved by acetaminophen). 05/01/14  Yes Daniel J Angiulli, PA-C  triamcinolone cream (KENALOG) 0.1 % Apply 1 application topically 2 (two) times daily as needed (eczema).  06/02/15  Yes Historical Provider, MD    Physical Exam: Filed Vitals:   08/16/15 1637 08/16/15 1832 08/16/15 2021 08/16/15 2050  BP: 108/61 110/48 131/51 137/62  Pulse: 77 80 80 81  Temp: 97.8 F (36.6 C)   97.2 F (36.2 C)  TempSrc: Oral   Oral  Resp: 18 18 16  32  Height:    5\' 4"  (1.626 m)  Weight:    72.8 kg (160 lb 7.9 oz)  SpO2: 94% 96% 95% 100%     General:  Moderately built and nourished.  Eyes: Anicteric no pallor.  ENT: No discharge from the ears eyes nose and mouth.  Neck: No mass felt. No JVD appreciated.  Cardiovascular: S1-S2 heard.  Respiratory: No rhonchi or crepitations.  Abdomen: Soft nontender bowel sounds present.  Skin: No rash.  Musculoskeletal: Right AKA.  Psychiatric: Appears mildly confused.  Neurologic: Alert awake oriented to her name. Moves all extremities.  Labs on Admission:  Basic Metabolic Panel:  Recent Labs Lab 08/16/15 1721  NA 139  K 3.1*  CL 106  CO2 15*  GLUCOSE 195*  BUN 168*  CREATININE 7.52*  CALCIUM 8.8*   Liver Function Tests:  Recent  Labs Lab 08/16/15 1721  AST 32  ALT 19  ALKPHOS 91  BILITOT 0.7  PROT 7.2  ALBUMIN 3.0*   No results for input(s): LIPASE, AMYLASE in the last 168 hours. No results for input(s): AMMONIA in the last 168 hours. CBC:  Recent Labs Lab 08/16/15 1721  WBC 23.5*  NEUTROABS 21.7*  HGB 9.8*  HCT 29.3*  MCV 68.8*  PLT 327   Cardiac Enzymes: No results for input(s): CKTOTAL, CKMB, CKMBINDEX, TROPONINI in the last 168 hours.  BNP (last 3 results)  Recent Labs  06/04/15 0911  BNP 308.6*    ProBNP (last 3 results) No results for input(s): PROBNP in the last 8760 hours.  CBG: No results for input(s): GLUCAP in the last 168 hours.  Radiological Exams on Admission: Dg Chest 1 View  08/16/2015   CLINICAL DATA:  Nursing home patient with worsening weakness and shortness of breath over the last 3 days. Personal history of hypertension, diabetes, and CABG.  EXAM: CHEST 1 VIEW  COMPARISON:  06/24/2015  FINDINGS: There has been previous median sternotomy and CABG. Heart size is at the upper limits of normal. There is atherosclerosis of the aorta. There is pulmonary venous hypertension, possibly with early interstitial edema. No visible effusion. No focal infiltrate or collapse. Chronic degenerative changes affect the shoulders.  IMPRESSION: Probable low level congestive heart failure with venous hypertension and early interstitial edema.   Electronically Signed   By: Nelson Chimes M.D.   On: 08/16/2015 17:54   US Renal  08/16/2015   CLINICAL DATA:  Renal failure  EXAM: RENAL / URINARY TRACT ULTRASOUND COMPLETE  COMPARISON:  06/25/2012  FINDINGS: Right Kidney:  Length: 10.0 cm. No mass or hydronephrosis. Moderate cortical thinning. Cortex is echogenic.  Left Kidney:  Length: 10.4 cm. No solid mass. Upper pole benign appearing cyst measures 3.2 x 2.5 x 2.57. Increased cortical echogenicity. Moderate cortical thinning.  Bladder:  Appears normal for degree of bladder distention.  IMPRESSION: No  hydronephrosis.  Increased cortical echogenicity compatible with medical renal parenchymal disease.   Electronically Signed   By: Marybelle Killings M.D.   On: 08/16/2015 20:10   Dg Shoulder Left  08/16/2015   CLINICAL DATA:  Generalized chronic left shoulder pain with decreased mobility.  EXAM: LEFT SHOULDER - 2+ VIEW  COMPARISON:  06/24/2015 chest x-ray  FINDINGS: Severe advanced degenerative joint disease of the [facility] joint and glenohumeral joint. These areas demonstrate joint space loss, sclerosis and osteophyte formation. No acute osseous finding or fracture. No gross malalignment. Patient was unable to perform an axillary view.  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  No definite acute osseous finding by plain radiography   Electronically Signed   By: Jerilynn Mages.  Shick M.D.   On: 08/16/2015 18:00     Assessment/Plan Principal Problem:   Acute on chronic renal failure (HCC) Active Problems:   S/P AKA (above knee amputation) (HCC)   Type 2 diabetes mellitus with vascular disease (HCC)   COPD (chronic obstructive pulmonary disease) (HCC)   Chronic diastolic heart failure (HCC)   ARF (acute renal failure) (HCC)   Chronic anemia   Essential hypertension   UTI (lower urinary tract infection)   1. Acute on chronic renal failure stage 4-5 - I have discussed patient's daughter who stated that patient has previously declined any dialysis. Plan is to hold patient's Lasix and gently hydrate. Renal failure probably secondary to patient's poor intake. Closely monitor intake output and metabolic panel. Patient has received 1 L normal saline bolus in the ER and I have placed patient on normal saline 100 mL/h for next 12 hours. Caution that patient does have a history of chronic diastolic heart failure. 2. UTI - check urine cultures patient is on ceftriaxone. 3. Chronic diastolic CHF last EF measured was 55-60% with grade 3 diastolic dysfunction and recently admitted in July for  decompensated CHF - presently Lasix on hold secondary to #1. 4. Leukocytosis probably secondary to UTI - patient is on ceftriaxone. Follow urine cultures. 5. Hypertension - patient's daughter states that patient's blood pressures running in the low normal. Closely observe if there is any drop in blood pressure will hold antihypertensives. 6. Diabetes mellitus type 2 on diet - patient is placed on sliding scale coverage. 7. Chronic anemia - follow CBC.  I have reviewed patient's old charts on labs. Personally reviewed patient's chest x-ray. Consult palliative care. Troponin and CK levels are pending.  DVT Prophylaxis heparin.  Code Status: DO NOT RESUSCITATE.  Family Communication: Discussed patient's daughter.  Disposition Plan: Admit to inpatient.    Trasean Delima N. [facility] Pager [phone].  If 7PM-7AM, please contact night-coverage [email or web address] Password [email or web address] 08/16/2015, 9:32 PM

## 2015-08-16 NOTE — ED Notes (Signed)
Call from Lab to correct BUN result to 168

## 2015-08-16 NOTE — ED Notes (Signed)
Bed: JW92 Expected date:  Expected time:  Means of arrival:  Comments: EMS-renal disorder

## 2015-08-16 NOTE — ED Notes (Signed)
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  She can be contacted for any needs or questions.

## 2015-08-16 NOTE — ED Notes (Signed)
Pt localizes pain to her L shoulder which has significant crepitus with little to no movement.

## 2015-08-16 NOTE — Progress Notes (Signed)
ANTIBIOTIC CONSULT NOTE - INITIAL  Pharmacy Consult for Ceftriaxone Indication: UTI  Allergies  Allergen Reactions  . Crestor [Rosuvastatin Calcium] Other (See Comments)    On [place]  . Fish Allergy     gout  . Lipitor [Atorvastatin] Other (See Comments)    On [place]  . Zocor [Simvastatin] Other (See Comments)    On [place]  . Celebrex [Celecoxib] Rash  . Codeine Other (See Comments)    REACTION: unknown  . Iodine Rash  . Omnipaque [Iohexol] Other (See Comments)    REACTION: unknown    Patient Measurements: Height: 5\' 4"  (162.6 cm) Weight: 160 lb 7.9 oz (72.8 kg) IBW/kg (Calculated) : 54.7 Adjusted Body Weight:   Vital Signs: Temp: 97.2 F (36.2 C) (10/03 2050) Temp Source: Oral (10/03 2050) BP: 137/62 mmHg (10/03 2050) Pulse Rate: 81 (10/03 2050) Intake/Output from previous day:   Intake/Output from this shift: Total I/O In: 500 [I.V.:500] Out: -   Labs:  Recent Labs  08/16/15 1721  WBC 23.5*  HGB 9.8*  PLT 327  CREATININE 7.52*   Estimated Creatinine Clearance: 5.2 mL/min (by C-G formula based on Cr of 7.52). No results for input(s): VANCOTROUGH, VANCOPEAK, VANCORANDOM, GENTTROUGH, GENTPEAK, GENTRANDOM, TOBRATROUGH, TOBRAPEAK, TOBRARND, AMIKACINPEAK, AMIKACINTROU, AMIKACIN in the last 72 hours.   Microbiology: No results found for this or any previous visit (from the past 720 hour(s)).  Medical History: Past Medical History  Diagnosis Date  . DM (diabetes mellitus) type II controlled peripheral vascular disorder (HCC)     A1C = 5.8  . HTN (hypertension)   . CHF (congestive heart failure) (HCC)     EF = 16-10%, grade 2 diastolic dysfunction  . CAD (coronary artery disease)   . COPD (chronic obstructive pulmonary disease) ([place])   . Cataracts, bilateral   . Hyperlipidemia   . Peripheral vascular disease ([place]) bilateral    S/P femoral-popliteal bypass surgery  . Glaucoma   . TIA (transient ischemic attack)   . Shortness of  breath   . Pneumonia     HX OF PNA  . Depression   . GERD (gastroesophageal reflux disease)   . Arthritis     RA  . Anemia   . CKD (chronic kidney disease)   . DVT (deep venous thrombosis) ([place])   . Cancer ([place])     Rt breast  . Hypoxia 06/04/2015    Medications:  Anti-infectives    Start     Dose/Rate Route Frequency Ordered Stop   08/17/15 1800  cefTRIAXone (ROCEPHIN) 1 g in dextrose 5 % 50 mL IVPB     1 g 100 mL/hr over 30 Minutes Intravenous Every 24 hours 08/16/15 2247     08/16/15 1915  cefTRIAXone (ROCEPHIN) 1 g in dextrose 5 % 50 mL IVPB     1 g 100 mL/hr over 30 Minutes Intravenous  Once 08/16/15 1904 08/16/15 1954     Assessment: Patient with UTI.  First dose of antibiotics already given.  Goal of Therapy:  Rocephin based on manufacturer dosing recommendations.  Plan: Ceftriaxone 1gm iv q24hr Follow up culture results  Nani Skillern [place] 08/16/2015,10:48 PM

## 2015-08-16 NOTE — ED Provider Notes (Signed)
CSN: [id number]     Arrival date & time 08/16/15  Feb 25, 1628 History   First MD Initiated Contact with Patient 08/16/15 1634     Chief Complaint  Patient presents with  . Weakness  . 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 Apparently the patient has had some generalized weakness.  Patient is a 81 y.o. female presenting with weakness. The history is provided by the patient.  Weakness    Past Medical History  Diagnosis Date  . DM (diabetes mellitus) type II controlled peripheral vascular disorder (HCC)     A1C = 5.8  . HTN (hypertension)   . CHF (congestive heart failure) (HCC)     EF = 64-40%, grade 2 diastolic dysfunction  . CAD (coronary artery disease)   . COPD (chronic obstructive pulmonary disease) ([place])   . Cataracts, bilateral   . Hyperlipidemia   . Peripheral vascular disease ([place]) bilateral    S/P femoral-popliteal bypass surgery  . Glaucoma   . TIA (transient ischemic attack)   . Shortness of breath   . Pneumonia     HX OF PNA  . Depression   . GERD (gastroesophageal reflux disease)   . Arthritis     RA  . Anemia   . CKD (chronic kidney disease)   . DVT (deep venous thrombosis) ([place])   . Cancer ([place])     Rt breast  . Hypoxia 06/04/2015   Past Surgical History  Procedure Laterality Date  . Coronary artery bypass graft    . Spine surgery    . Eye surgery  cataract  . Carpal tunnel release  left  . Breast surgery  lumpectomy right  . Aortogram w/ runoff  06/06/11    right leg  . Above knee leg  amputation  07/27/11    Right AKA   Family History  Problem Relation Age of Onset  . Hypertension Mother   . Peripheral vascular disease Mother   . Diabetes Father   . Kidney disease Sister   . Cancer Brother     lung  . Lung disease Sister   . Lung disease Sister   . Kidney disease Daughter     On dialysis, died in Feb 24, 2006   Social History  Substance Use Topics  . Smoking status: Former Smoker -- 1.00 packs/day for 40 years    Types: Cigarettes    Quit date: 04/13/1977  . Smokeless tobacco: Never Used  . Alcohol Use: No   OB History    No data available     Review of Systems  Neurological: Positive for weakness.  All other systems reviewed and are negative.     Allergies  Fish allergy; Celebrex; Codeine; Iodine; and Omnipaque  Home Medications   Prior to Admission medications   Medication Sig Start Date End Date Taking? Authorizing Provider  acetaminophen (TYLENOL) 500 MG tablet Take 500 mg by mouth every 6 (six) hours as needed for mild pain.    Historical Provider, MD  allopurinol (ZYLOPRIM) 100 MG tablet Take 1 tablet (100 mg total) by mouth at bedtime. 05/01/14  Lavon Paganini Angiulli, PA-C  amLODipine (NORVASC) 10 MG tablet Take 1 tablet (10 mg total) by mouth daily. 05/01/14   Lavon Paganini Angiulli, PA-C  aspirin EC 81 MG tablet Take 81 mg by mouth daily.    Historical Provider, MD  b complex-vitamin c-folic acid (NEPHRO-VITE) 0.8 MG TABS tablet Take 1 tablet by mouth every morning.    Historical Provider, MD  citalopram (CELEXA) 20 MG tablet Take 20 mg by mouth every morning.    Historical Provider, MD  cloNIDine (CATAPRES) 0.1 MG tablet Take 0.1 mg by mouth once as needed (if sbp >180 and dbp >110).     Historical Provider, MD  clotrimazole (LOTRIMIN) 1 % external solution Apply 1 application topically 2 (two) times daily as needed (fungal rash on foot).    Historical Provider, MD  ezetimibe (ZETIA) 10 MG tablet Take 1 tablet (10 mg total) by mouth daily. 05/01/14    Lavon Paganini Angiulli, PA-C  famotidine (PEPCID) 20 MG tablet Take 1 tablet (20 mg total) by mouth daily. 05/01/14   Lavon Paganini Angiulli, PA-C  furosemide (LASIX) 80 MG tablet Take 2 tablets (160 mg total) by mouth 2 (two) times daily. 06/11/15   Maryann Mikhail, DO  Glucosamine 500 MG CAPS Take 1,500 mg by mouth 3 (three) times daily.    Historical Provider, MD  hydrALAZINE (APRESOLINE) 100 MG tablet Take 1 tablet (100 mg total) by mouth 3 (three) times daily. 05/01/14   Lavon Paganini Angiulli, PA-C  iron polysaccharides (FERREX 150) 150 MG capsule Take 150 mg by mouth 2 (two) times daily.    Historical Provider, MD  isosorbide mononitrate (IMDUR) 120 MG 24 hr tablet Take 1 tablet (120 mg total) by mouth daily. 05/01/14   Lavon Paganini Angiulli, PA-C  ketoconazole (NIZORAL) 2 % cream Apply 1 application topically daily as needed for irritation.  06/01/15   Historical Provider, MD  loratadine (CLARITIN) 10 MG tablet Take 1 tablet (10 mg total) by mouth daily. 05/01/14   Lavon Paganini Angiulli, PA-C  montelukast (SINGULAIR) 10 MG tablet Take 1 tablet (10 mg total) by mouth at bedtime. 05/01/14   Lavon Paganini Angiulli, PA-C  polyethylene glycol (MIRALAX / GLYCOLAX) packet Take 17 g by mouth daily as needed for mild constipation.    Historical Provider, MD  sennosides-docusate sodium (SENOKOT-S) 8.6-50 MG tablet Take 1 tablet by mouth daily as needed for constipation.    Historical Provider, MD  timolol (BETIMOL) 0.5 % ophthalmic solution Place 1 drop into both eyes 2 (two) times daily.    Historical Provider, MD  traMADol (ULTRAM) 50 MG tablet Take 1 tablet (50 mg total) by mouth every 12 (twelve) hours as needed (1 to 2 tablets for pain not relieved by acetaminophen). 05/01/14   Lavon Paganini Angiulli, PA-C  triamcinolone cream (KENALOG) 0.1 % Apply 1 application topically 2 (two) times daily as needed (eczema).  06/02/15   Historical Provider, MD   BP 108/61 mmHg  Pulse 77  Temp(Src) 97.8 F (36.6 C) (Oral)  Resp 18  SpO2  94% Physical Exam  Constitutional: She appears well-developed and well-nourished. No distress.  HENT:  Head: Normocephalic and atraumatic.  Mouth/Throat: Oropharynx is clear and moist. No oropharyngeal exudate.  Eyes: Conjunctivae and EOM are normal. Pupils are equal, round, and reactive to light. Right eye exhibits no discharge. Left eye exhibits no discharge. No scleral icterus.  Neck: Normal range of motion. Neck supple. No JVD present. No thyromegaly present.  Cardiovascular: Normal rate, regular rhythm, normal heart  sounds and intact distal pulses.  Exam reveals no gallop and no friction rub.   No murmur heard. Pulmonary/Chest: Effort normal and breath sounds normal. No respiratory distress. She has no wheezes. She has no rales.  Scattered rhonchorous sounds, no distress  Abdominal: Soft. Bowel sounds are normal. She exhibits no distension and no mass. There is no tenderness.  No abdominal tenderness to palpation  Musculoskeletal: Normal range of motion. She exhibits no edema or tenderness.  Stump on the right is well healed, no redness or swelling or drainage  Lymphadenopathy:    She has no cervical adenopathy.  Neurological: She is alert. Coordination normal.  Skin: Skin is warm and dry. No rash noted. No erythema.  Psychiatric: She has a normal mood and affect. Her behavior is normal.  Nursing note and vitals reviewed.   ED Course  Procedures (including critical care time) Labs Review Labs Reviewed  COMPREHENSIVE METABOLIC PANEL  CBC WITH DIFFERENTIAL/PLATELET  URINALYSIS, ROUTINE W REFLEX MICROSCOPIC (NOT AT [facility])    Imaging Review No results found. I have personally reviewed and evaluated these images and lab results as part of my medical decision-making.   EKG Interpretation None      MDM   Final diagnoses:  Acute renal failure, unspecified acute renal failure type ([place])  UTI (lower urinary tract infection)    The patient has apparently been diagnosed with  acute on chronic renal failure, this will need to be confirmed with lab work, check urinalysis, rule out postobstructive uropathy, the patient does not appear to be in distress, she does moan frequently but does not have any signs of infection trauma or other injuries. She does have some shoulder pain which she states has been there for 3 weeks, there is no crepitance of the shoulder, will image.  The pt appears ill with ongoing altered mental status,   Multiple reevlauation while in the ED  In and out cath with minimal urine - bladder scan shows very little left after cath - not obstrutive uropathy  Labs very abnormal  Fluids given, admit to hosptialist - antibiotics for UTI.      Noemi Chapel, MD 08/17/15 [phone]

## 2015-08-17 DIAGNOSIS — E876 Hypokalemia: Secondary | ICD-10-CM | POA: Diagnosis present

## 2015-08-17 DIAGNOSIS — I5033 Acute on chronic diastolic (congestive) heart failure: Secondary | ICD-10-CM | POA: Diagnosis present

## 2015-08-17 DIAGNOSIS — D631 Anemia in chronic kidney disease: Secondary | ICD-10-CM | POA: Diagnosis present

## 2015-08-17 DIAGNOSIS — D72829 Elevated white blood cell count, unspecified: Secondary | ICD-10-CM | POA: Diagnosis present

## 2015-08-17 DIAGNOSIS — G9340 Encephalopathy, unspecified: Secondary | ICD-10-CM | POA: Diagnosis present

## 2015-08-17 DIAGNOSIS — E440 Moderate protein-calorie malnutrition: Secondary | ICD-10-CM | POA: Diagnosis present

## 2015-08-17 DIAGNOSIS — N189 Chronic kidney disease, unspecified: Secondary | ICD-10-CM

## 2015-08-17 LAB — GLUCOSE, CAPILLARY
GLUCOSE-CAPILLARY: 152 mg/dL — AB (ref 65–99)
GLUCOSE-CAPILLARY: 176 mg/dL — AB (ref 65–99)
GLUCOSE-CAPILLARY: 181 mg/dL — AB (ref 65–99)
Glucose-Capillary: 121 mg/dL — ABNORMAL HIGH (ref 65–99)
Glucose-Capillary: 124 mg/dL — ABNORMAL HIGH (ref 65–99)

## 2015-08-17 LAB — CBC WITH DIFFERENTIAL/PLATELET
BASOS ABS: 0 10*3/uL (ref 0.0–0.1)
Basophils Relative: 0 %
EOS PCT: 1 %
Eosinophils Absolute: 0.2 10*3/uL (ref 0.0–0.7)
HEMATOCRIT: 26.8 % — AB (ref 36.0–46.0)
Hemoglobin: 8.9 g/dL — ABNORMAL LOW (ref 12.0–15.0)
LYMPHS PCT: 4 %
Lymphs Abs: 0.8 10*3/uL (ref 0.7–4.0)
MCH: 23.1 pg — ABNORMAL LOW (ref 26.0–34.0)
MCHC: 33.2 g/dL (ref 30.0–36.0)
MCV: 69.4 fL — AB (ref 78.0–100.0)
MONOS PCT: 4 %
Monocytes Absolute: 0.8 10*3/uL (ref 0.1–1.0)
NEUTROS PCT: 91 %
Neutro Abs: 19 10*3/uL — ABNORMAL HIGH (ref 1.7–7.7)
Platelets: 296 10*3/uL (ref 150–400)
RBC: 3.86 MIL/uL — AB (ref 3.87–5.11)
RDW: 17.1 % — AB (ref 11.5–15.5)
WBC: 20.8 10*3/uL — AB (ref 4.0–10.5)

## 2015-08-17 LAB — COMPREHENSIVE METABOLIC PANEL
ALT: 16 U/L (ref 14–54)
ANION GAP: 19 — AB (ref 5–15)
AST: 21 U/L (ref 15–41)
Albumin: 2.5 g/dL — ABNORMAL LOW (ref 3.5–5.0)
Alkaline Phosphatase: 76 U/L (ref 38–126)
BILIRUBIN TOTAL: 0.7 mg/dL (ref 0.3–1.2)
BUN: 195 mg/dL — ABNORMAL HIGH (ref 6–20)
CHLORIDE: 106 mmol/L (ref 101–111)
CO2: 18 mmol/L — ABNORMAL LOW (ref 22–32)
Calcium: 8.5 mg/dL — ABNORMAL LOW (ref 8.9–10.3)
Creatinine, Ser: 7.2 mg/dL — ABNORMAL HIGH (ref 0.44–1.00)
GFR, EST AFRICAN AMERICAN: 5 mL/min — AB (ref >60)
GFR, EST NON AFRICAN AMERICAN: 5 mL/min — AB (ref >60)
Glucose, Bld: 175 mg/dL — ABNORMAL HIGH (ref 65–99)
POTASSIUM: 2.8 mmol/L — AB (ref 3.5–5.1)
Sodium: 143 mmol/L (ref 135–145)
TOTAL PROTEIN: 6.5 g/dL (ref 6.5–8.1)

## 2015-08-17 LAB — CREATININE, URINE, RANDOM: CREATININE, URINE: 79.23 mg/dL

## 2015-08-17 LAB — CK: Total CK: 24 U/L — ABNORMAL LOW (ref 38–234)

## 2015-08-17 LAB — SODIUM, URINE, RANDOM: Sodium, Ur: 58 mmol/L

## 2015-08-17 LAB — TROPONIN I: TROPONIN I: 0.04 ng/mL — AB (ref <0.031)

## 2015-08-17 MED ORDER — CHLORHEXIDINE GLUCONATE CLOTH 2 % EX PADS
6.0000 | MEDICATED_PAD | Freq: Every day | CUTANEOUS | Status: DC
  Administered 2015-08-17 – 2015-08-20 (×4): 6 via TOPICAL

## 2015-08-17 MED ORDER — ENSURE ENLIVE PO LIQD
237.0000 mL | Freq: Two times a day (BID) | ORAL | Status: DC
  Administered 2015-08-17 – 2015-08-20 (×4): 237 mL via ORAL

## 2015-08-17 MED ORDER — POTASSIUM CHLORIDE CRYS ER 20 MEQ PO TBCR
40.0000 meq | EXTENDED_RELEASE_TABLET | Freq: Once | ORAL | Status: AC
  Administered 2015-08-17: 40 meq via ORAL
  Filled 2015-08-17: qty 2

## 2015-08-17 MED ORDER — PROMETHAZINE HCL 25 MG PO TABS: 25.0000 mg | ORAL_TABLET | Freq: Four times a day (QID) | ORAL | Status: DC | PRN

## 2015-08-17 MED ORDER — MUPIROCIN 2 % EX OINT
1.0000 "application " | TOPICAL_OINTMENT | Freq: Two times a day (BID) | CUTANEOUS | Status: DC
  Administered 2015-08-17 – 2015-08-20 (×7): 1 via NASAL
  Filled 2015-08-17 (×3): qty 22

## 2015-08-17 NOTE — Progress Notes (Signed)
Patient ID: [name], female   DOB: 1927-01-04, 81 y.o.   MRN: [id number]  [facility] PROGRESS NOTE  [name] KZS:[id number] DOB: Jun 04, 1927 DOA: 08/16/2015 PCP: Simona Huh, MD   Brief narrative:    81 y.o. female with history of diabetes mellitus, chronic kidney disease stage 4 - 5, hypertension, chronic diastolic CHF grade 3, who was recently admitted in July for decompensated CHF and presented for progressively worsening weakness, poor oral intake, nausea but no vomiting.   In Ed, pt noted to be hemodynamically stable, Blood work notable for WBC 23.5 K, K 3.1, Cr 7.52 with CO2 = 15. [facility] asked to admit for further evaluation.   Assessment/Plan:    Principal Problem:   Acute on chronic renal failure, stage V - IVF have been provided as the acute rise in Cr was determined to be secondary to pre renal etiology, poor oral intake - lasix has also been held on admission - Cr is now trending down but will have to hold off on IVF as pt now with crackles on exam - d/w pt's granddaughter, pt already made it clear before she did not want HD - d/w nephrologist and agreeable to conservative management  - palliative care consult also placed - will repeat BMP in AM  Active Problems:   Acute encephalopathy - in the setting of progressive failure to thrive and dehydration, progressive renal failure - still somnolent this AM but easy to awake    Leukocytosis - UA suggestive of UTI and pt was already started on ABX Rocephin - continue Rocephin day #2 and follow up on urine cultures     Anemia of CKD, stage V - no signs of active bleeding - continue iron supplementation  - CBC in AM    Moderate malnutrition - nutritionist consulted     Hypokalemia - supplement and repeat BMP in AM    Metabolic acidosis     - in the setting of acute on chronic renal failure  - improving with IVF but developing of vascular congestion limits use of additional IVF - encourage PO  intake if pt able to tolerate      S/P AKA (above knee amputation), right  - stable at this time    Type 2 diabetes mellitus with vascular disease ([place]), CKD stage V - continue SSI     COPD (chronic obstructive pulmonary disease) ([place]) - no wheezing on exam this AM  - allow BD's as needed     Acute on chronic diastolic CHF - last 2 D ECHO 06/09/15 with grade II diastolic CHF and EF 73% - more crackles on exam this AM - hold IVF - weight 73.6 kg this AM - resume Lasix in AM - monitor daily weights, strict I/O    Essential hypertension - reasonable inpatient control  - continue Imdur, Norvasc, Hydralazine   DVT prophylaxis - Heparin SQ  Code Status: DNR Family Communication:  plan of care discussed with granddaughter [name] over the phone  Disposition Plan: Home when stable.   IV access:  Peripheral IV  Procedures and diagnostic studies:    Dg Chest 1 View 08/16/2015   Probable low level congestive heart failure with venous hypertension and early interstitial edema.     US Renal 08/16/2015  No hydronephrosis.  Increased cortical echogenicity compatible with medical renal parenchymal disease.     Dg Shoulder Left 08/16/2015 Severe advanced left shoulder degenerative joint disease.  No definite acute osseous finding by plain radiography  Medical Consultants:  PCT   Other Consultants:  Nutritionist   IAnti-Infectives:   Rocephin 10/03 -->  Faye Ramsay, MD  [place] Pager [phone]  If 7PM-7AM, please contact night-coverage [email or web address] Password [email or web address] 08/17/2015, 1:08 PM   LOS: 1 day   HPI/Subjective: No events overnight.   Objective: Filed Vitals:   08/16/15 2021 08/16/15 2050 08/17/15 0500 08/17/15 0619  BP: 131/51 137/62  130/48  Pulse: 80 81  80  Temp:  97.2 F (36.2 C)  98.3 F (36.8 C)  TempSrc:  Oral  Oral  Resp: 16 32  16  Height:  5\' 4"  (1.626 m)    Weight:  72.8 kg (160 lb 7.9 oz) 73.6 kg (162 lb 4.1 oz)   SpO2: 95% 100%  97%     Intake/Output Summary (Last 24 hours) at 08/17/15 1308 Last data filed at 08/17/15 0600  Gross per 24 hour  Intake    500 ml  Output    445 ml  Net     55 ml    Exam:   General:  Pt is somnolent but easy to arouse, NAD  Cardiovascular: Regular rate and rhythm,  no rubs, no gallops  Respiratory: Clear to auscultation bilaterally, crackles at bases   Abdomen: Soft, non tender, non distended, bowel sounds present, no guarding  Extremities: Right AKA  Data Reviewed: Basic Metabolic Panel:  Recent Labs Lab 08/16/15 1721 08/17/15 0837  NA 139 143  K 3.1* 2.8*  CL 106 106  CO2 15* 18*  GLUCOSE 195* 175*  BUN 168* 195*  CREATININE 7.52* 7.20*  CALCIUM 8.8* 8.5*   Liver Function Tests:  Recent Labs Lab 08/16/15 1721 08/17/15 0837  AST 32 21  ALT 19 16  ALKPHOS 91 76  BILITOT 0.7 0.7  PROT 7.2 6.5  ALBUMIN 3.0* 2.5*   CBC:  Recent Labs Lab 08/16/15 1721 08/17/15 0837  WBC 23.5* 20.8*  NEUTROABS 21.7* 19.0*  HGB 9.8* 8.9*  HCT 29.3* 26.8*  MCV 68.8* 69.4*  PLT 327 296   Cardiac Enzymes:  Recent Labs Lab 08/17/15 0835  CKTOTAL 24*  TROPONINI 0.04*   CBG:  Recent Labs Lab 08/16/15 2354 08/17/15 0736 08/17/15 1201  GLUCAP 176* 181* 152*    Recent Results (from the past 240 hour(s))  MRSA PCR Screening     Status: Abnormal   Collection Time: 08/16/15  9:17 PM  Result Value Ref Range Status   MRSA by PCR POSITIVE (A) NEGATIVE Final     Scheduled Meds: . allopurinol  100 mg Oral QHS  . amLODipine  10 mg Oral Daily  . aspirin EC  81 mg Oral Daily  . cefTRIAXone  IV  1 g Intravenous Q24H  . citalopram  20 mg Oral q morning - 10a  . ezetimibe  10 mg Oral Daily  . famotidine  20 mg Oral Daily  . heparin subcutaneous  5,000 Units Subcutaneous 3 times per day  . hydrALAZINE  100 mg Oral TID  . insulin aspart  0-9 Units Subcutaneous TID WC  . iron polysaccharides  150 mg Oral BID  . isosorbide mononitrate  120 mg Oral Daily  .  montelukast  10 mg Oral QHS  . timolol  1 drop Both Eyes BID   Continuous Infusions:

## 2015-08-17 NOTE — Progress Notes (Signed)
PT Cancellation Note  Patient Details Name: [name] MRN: [id number] DOB: 07-23-1927   Cancelled Treatment:    Reason Eval/Treat Not Completed: Medical issues which prohibited therapy Pt with chronic kidney disease and BUN 195 today.  Pt also with palliative consult and from ALF.  Will await plan of care.   [name],KATHrine E 08/17/2015, 1:30 PM Carmelia Bake, PT, DPT 08/17/2015 Pager: [phone]

## 2015-08-17 NOTE — Progress Notes (Signed)
Initial Nutrition Assessment  DOCUMENTATION CODES:   Non-severe (moderate) malnutrition in context of chronic illness  INTERVENTION:  - Will order Ensure Enlive po BID, each supplement provides 350 kcal and 20 grams of protein - Please order yogurt for breakfast each morning - Please provide feeding assistance as needed - RD will continue to monitor for needs  NUTRITION DIAGNOSIS:   Inadequate oral intake related to poor appetite as evidenced by per patient/family report.  GOAL:   Patient will meet greater than or equal to 90% of their needs  MONITOR:   PO intake, Supplement acceptance, Weight trends, Labs, I & O's  REASON FOR ASSESSMENT:   Malnutrition Screening Tool  ASSESSMENT:   81 y.o. female with history of diabetes mellitus on diet, chronic kidney disease stage IV to 5, hypertension, chronic diastolic CHF grade 3 who was recently admitted in July for decompressive CHF and patient is on large dose of Lasix was found to have increasing weakness over the last 2 weeks. Patient's daughter states that patient has been hardly eating anything over the last 1 week. Patient has been having some nausea but denies any vomiting or diarrhea. No new medications were added. Patient had gone to her PCP and was found to have elevated creatinine from her baseline and was referred to the ER. On exam patient is not in distress. Patient has chronic left shoulder pain. Patient states she had some chest pain one week ago but denies any pain now. Denies any shortness of breath.  Pt seen for MST. BMI indicates overweight status. No meal intakes documented since admission. Pt sleeping at time of visit and family reports all information. Pt typically had a good appetite until 3-4 weeks ago when she began eating less and refusing foods. They state that pt was at [facility] and that the care was not adequate and pt was not assisted with ADLs. Family member also states that comments were made to pt about her  weight and she (family member) wonders if decreased intakes could also be related to this in an effort to lose weight.  At facility pt was not given supplements such as Ensure or Boost but family had requested she receive yogurt daily as this had helped with yeast infections in the past. They state that pt has become less motivated over the past few months since BKA. They request that physical assessment not be done at this time as pt is resting.  Family is unsure of UBW stating "two hundred and something before amputation." Per chart review, pt has lost 18 lbs (10% body weight) in the past 2.5 months which is partly related to amputation.   Pt does not have teeth and family report she does have dentures and is able to chew well with them in place.  Not meeting needs. Medications reviewed. Labs reviewed; CBGs: 152-181 mg/dL, K: 2.8 mg/dL, BUN/creatinine elevated and trending up, Ca: 8.5 mg/dL, GFR: 5.   Diet Order:  Diet heart healthy/carb modified Room service appropriate?: Yes; Fluid consistency:: Thin; Fluid restriction:: 1200 mL Fluid  Skin:  Wound (see comment) (MASD to sacrum)  Last BM:  PTA  Height:   Ht Readings from Last 1 Encounters:  08/16/15 5\' 4"  (1.626 m)    Weight:   Wt Readings from Last 1 Encounters:  08/17/15 162 lb 4.1 oz (73.6 kg)    Ideal Body Weight:  54.54 kg (kg)  BMI:  Body mass index is 27.84 kg/(m^2).  Estimated Nutritional Needs:   Kcal:  5732-2025  Protein:  55-65 grams  Fluid:  2.2 L/day  EDUCATION NEEDS:   No education needs identified at this time     Jarome Matin, RD, [facility] Inpatient Clinical Dietitian Pager # [phone] After hours/weekend pager # [phone]

## 2015-08-17 NOTE — Progress Notes (Signed)
Pt with urinary retention, lab spec needed per MD, MD notified, folwy ordered pt had--150 cc clear yellow urine will cont to monitor accurate output.

## 2015-08-17 NOTE — Clinical Social Work Note (Signed)
Clinical Social Work Assessment  Patient Details  Name: [name] MRN: [id number] Date of Birth: 20-Jan-1927  Date of referral:  08/17/15               Reason for consult:  Facility Placement                Permission sought to share information with:  Family Supports Permission granted to share information::  No (Contacted patient's next of kin, [name], as she was unable to discuss plans with CSW  due to medical status)  Name::        Agency::     Relationship::     Contact Information:     Housing/Transportation Living arrangements for the past 2 months:  [place] of Information:  Adult Children, Other (Comment Required) [name]- family contact/rep) Patient Interpreter Needed:  None Criminal Activity/Legal Involvement Pertinent to Current Situation/Hospitalization:  No - Comment as needed Significant Relationships:  Adult Children Lives with:  Facility Resident Do you feel safe going back to the place where you live?  Yes Need for family participation in patient care:  Yes (Comment)  Care giving concerns:  Patient admitted from [place] where she has lived for about 1 year. Prior to that, she was living with her granddaughter, [name]. Per [name], plans are to meet with Palliative team to discuss plans/disposition, etc. At this time she is uncertain if the family will want her to return to the ALF as well as if she will be appropriate to return.    Social Worker assessment / plan:  CSW discussed possible dc options with [name] and will await further assessment and discussion with Palliative team and family.   Employment status:  Retired Forensic scientist:  Information systems manager, Medicaid In [place] PT Recommendations:  Not assessed at this time [place] / Referral to community resources:     Patient/Family's Response to care:  Family agrees to plan of care and treatment plans at this time and is eager to talk with Palliative team.    Patient/Family's Understanding of and Emotional Response to Diagnosis, Current Treatment, and Prognosis:  Family understands that she is declining and may need EOL care. Family is supportive and engaged in her care and will meet with Palliative to discuss plans further.   Emotional Assessment Appearance:  Other (Comment Required Attitude/Demeanor/Rapport:  Unable to Assess Affect (typically observed):  Unable to Assess Orientation:  Oriented to Self Alcohol / Substance use:  Not Applicable Psych involvement (Current and /or in the community):  No (Comment)  Discharge Needs  Concerns to be addressed:  Discharge Planning Concerns Readmission within the last 30 days:  No Current discharge risk:  None Barriers to Discharge:  No Barriers Identified   Cassandra Clarks, LCSW 08/17/2015, 1:29 PM

## 2015-08-17 NOTE — Progress Notes (Signed)
Lab called MRSA positve PCR--Contact prec inititated. SRP, RN

## 2015-08-18 DIAGNOSIS — R52 Pain, unspecified: Secondary | ICD-10-CM

## 2015-08-18 DIAGNOSIS — E440 Moderate protein-calorie malnutrition: Secondary | ICD-10-CM

## 2015-08-18 DIAGNOSIS — N189 Chronic kidney disease, unspecified: Secondary | ICD-10-CM

## 2015-08-18 DIAGNOSIS — N185 Chronic kidney disease, stage 5: Secondary | ICD-10-CM

## 2015-08-18 DIAGNOSIS — E1159 Type 2 diabetes mellitus with other circulatory complications: Secondary | ICD-10-CM

## 2015-08-18 DIAGNOSIS — Z89611 Acquired absence of right leg above knee: Secondary | ICD-10-CM

## 2015-08-18 DIAGNOSIS — N179 Acute kidney failure, unspecified: Principal | ICD-10-CM

## 2015-08-18 DIAGNOSIS — Z515 Encounter for palliative care: Secondary | ICD-10-CM

## 2015-08-18 DIAGNOSIS — Z7189 Other specified counseling: Secondary | ICD-10-CM

## 2015-08-18 DIAGNOSIS — I10 Essential (primary) hypertension: Secondary | ICD-10-CM

## 2015-08-18 DIAGNOSIS — D631 Anemia in chronic kidney disease: Secondary | ICD-10-CM

## 2015-08-18 DIAGNOSIS — G9340 Encephalopathy, unspecified: Secondary | ICD-10-CM

## 2015-08-18 LAB — RENAL FUNCTION PANEL
ANION GAP: 18 — AB (ref 5–15)
Albumin: 2.8 g/dL — ABNORMAL LOW (ref 3.5–5.0)
BUN: 188 mg/dL — ABNORMAL HIGH (ref 6–20)
CHLORIDE: 110 mmol/L (ref 101–111)
CO2: 17 mmol/L — AB (ref 22–32)
Calcium: 8.6 mg/dL — ABNORMAL LOW (ref 8.9–10.3)
Creatinine, Ser: 6.53 mg/dL — ABNORMAL HIGH (ref 0.44–1.00)
GFR, EST AFRICAN AMERICAN: 6 mL/min — AB (ref >60)
GFR, EST NON AFRICAN AMERICAN: 5 mL/min — AB (ref >60)
Glucose, Bld: 142 mg/dL — ABNORMAL HIGH (ref 65–99)
POTASSIUM: 2.9 mmol/L — AB (ref 3.5–5.1)
Phosphorus: 5.4 mg/dL — ABNORMAL HIGH (ref 2.5–4.6)
Sodium: 145 mmol/L (ref 135–145)

## 2015-08-18 LAB — GLUCOSE, CAPILLARY
Glucose-Capillary: 149 mg/dL — ABNORMAL HIGH (ref 65–99)
Glucose-Capillary: 159 mg/dL — ABNORMAL HIGH (ref 65–99)

## 2015-08-18 LAB — CBC
HEMATOCRIT: 27.3 % — AB (ref 36.0–46.0)
HEMOGLOBIN: 9 g/dL — AB (ref 12.0–15.0)
MCH: 23.1 pg — AB (ref 26.0–34.0)
MCHC: 33 g/dL (ref 30.0–36.0)
MCV: 70.2 fL — AB (ref 78.0–100.0)
PLATELETS: 304 10*3/uL (ref 150–400)
RBC: 3.89 MIL/uL (ref 3.87–5.11)
RDW: 17.2 % — ABNORMAL HIGH (ref 11.5–15.5)
WBC: 17.6 10*3/uL — AB (ref 4.0–10.5)

## 2015-08-18 MED ORDER — FENTANYL CITRATE (PF) 100 MCG/2ML IJ SOLN
12.5000 ug | Freq: Four times a day (QID) | INTRAMUSCULAR | Status: DC
  Administered 2015-08-18 – 2015-08-20 (×8): 12.5 ug via INTRAVENOUS
  Filled 2015-08-18 (×8): qty 2

## 2015-08-18 MED ORDER — BISACODYL 10 MG RE SUPP: 10.0000 mg | Freq: Every day | RECTAL | Status: DC | PRN

## 2015-08-18 MED ORDER — FENTANYL CITRATE (PF) 100 MCG/2ML IJ SOLN
12.5000 ug | INTRAMUSCULAR | Status: DC | PRN
  Administered 2015-08-19 – 2015-08-20 (×2): 12.5 ug via INTRAVENOUS
  Filled 2015-08-18 (×2): qty 2

## 2015-08-18 MED ORDER — POLYETHYLENE GLYCOL 3350 17 G PO PACK
17.0000 g | PACK | Freq: Every day | ORAL | Status: DC
  Administered 2015-08-18 – 2015-08-20 (×3): 17 g via ORAL
  Filled 2015-08-18 (×3): qty 1

## 2015-08-18 MED ORDER — DOCUSATE SODIUM 100 MG PO CAPS: 100.0000 mg | ORAL_CAPSULE | Freq: Two times a day (BID) | ORAL | Status: DC

## 2015-08-18 NOTE — Progress Notes (Signed)
Patient ID: [name], female   DOB: 1927-03-14, 81 y.o.   MRN: [id number]  [facility] PROGRESS NOTE  [name] MBW:[id number] DOB: 1927-01-12 DOA: 08/16/2015 PCP: Simona Huh, MD   Brief narrative:    81 y.o. female with history of diabetes mellitus, chronic kidney disease stage 4 - 5, hypertension, chronic diastolic CHF grade 3, who was recently admitted in July for decompensated CHF and presented for progressively worsening weakness, poor oral intake, nausea but no vomiting.   In Ed, pt noted to be hemodynamically stable, Blood work notable for WBC 23.5 K, K 3.1, Cr 7.52 with CO2 = 15. TR[facility] asked to admit for further evaluation.   Assessment/Plan:    Principal Problem:   Acute on chronic renal failure, stage V - IVF have been provided as the acute rise in Cr was determined to be secondary to pre renal etiology, poor oral intake - patient developed bibasilar crackles and BUN continue to be very elevated -family and patient do not want to pursuit further invasive treatment and in fact pt already made it clear before she did not want HD and has firmly kept that position  - d/w nephrologist and agreeable to pursuit comfort care - palliative care consulted and per results on [place] meeting plan is for comfort care and residential hospice    Acute encephalopathy - in the setting of progressive failure to thrive and dehydration, progressive renal failure and uremia - still somnolent this AM but easy to awake -plan is for comfort care -patient has firmly refused HD    Leukocytosis - UA suggestive of UTI and pt was already started on ABX Rocephin on admission -received tx for 2 days; but plan is for no further antibiotics and focus on comfort care    Anemia of CKD, stage V - no signs of active bleeding - plan is for full comfort care and per discussion with family and assistance from palliative care will pursuit residential hospice placement     Moderate malnutrition -  comfort feeding anticipated  -patient is a full comfort now    Hypokalemia - supplemented -no further blood work will be pursuit at this moment  -plan is for full comfort care    Metabolic acidosis     - in the setting of acute on chronic renal failure  - improving with IVF but developing of vascular congestion limits use of additional IVF - encourage PO intake if pt able to tolerate   -patient has declined HD and after [place] meeting decision has been taken for full comfort care and discharge to hospice home when bed available     S/P AKA (above knee amputation), right  - stable at this time    Type 2 diabetes mellitus with vascular disease ([place]), CKD stage V - no more insulin or treatment for diabetes will be pursuit -plan of care is full comfort care    COPD (chronic obstructive pulmonary disease) ([place]) - no wheezing on exam this AM  - allow BD's as needed     Acute on chronic diastolic CHF - last 2 D ECHO 06/09/15 with grade II diastolic CHF and EF 01% - patient w/o JVD and no frank crackles on exam this AM - IVF has now been off for 36 hours - weight stable at 74kg this AM - monitor daily weights, strict I/O Plan is for comfort measures -use of fentanyl for SOB and discomfort will be used     Essential hypertension - reasonable inpatient  control  - continue Imdur and Norvasc  DVT prophylaxis - heparin discontinue as part of comfort care measures   Code Status: DNR Family Communication:  plan of care discussed with granddaughter [name] at bedside  Disposition Plan: residential hospice in the next 24 hours or so.   IV access:  Peripheral IV  Procedures and diagnostic studies:    Dg Chest 1 View 08/16/2015   Probable low level congestive heart failure with venous hypertension and early interstitial edema.     US Renal 08/16/2015  No hydronephrosis.  Increased cortical echogenicity compatible with medical renal parenchymal disease.     Dg Shoulder Left 08/16/2015  Severe advanced left shoulder degenerative joint disease.  No definite acute osseous finding by plain radiography     Medical Consultants:  Palliative Care  Other Consultants:  Nutritionist   IAnti-Infectives:   Rocephin 10/03 -->10/05  Barton Dubois, MD  [facility] Pager [phone]  If 7PM-7AM, please contact night-coverage [email or web address] Password [email or web address] 08/18/2015, 6:04 PM   LOS: 2 days   HPI/Subjective: No CP, no SOB, no abd pain and complaining mainly of constipation. Patient denies nausea and vomiting.   Objective: Filed Vitals:   08/17/15 1437 08/17/15 2140 08/18/15 0439 08/18/15 1500  BP: 122/57 137/58 142/59 135/57  Pulse: 61 71 69 72  Temp: 97.5 F (36.4 C) 98.2 F (36.8 C) 98.1 F (36.7 C) 98.2 F (36.8 C)  TempSrc: Axillary Oral Oral Oral  Resp: 18 18 18 18   Height:      Weight:   74 kg (163 lb 2.3 oz)   SpO2: 97% 100% 100% 100%    Intake/Output Summary (Last 24 hours) at 08/18/15 1804 Last data filed at 08/18/15 1700  Gross per 24 hour  Intake    360 ml  Output    600 ml  Net   -240 ml    Exam:   General:  Pt is somnolent but easy to arouse, NAD and complaining of just some constipation   Cardiovascular: Regular rate and rhythm,  no rubs, no gallops, no appreciated JVD  Respiratory: Clear to auscultation bilaterally, no frank crackles appreciateds   Abdomen: Soft, non tender, non distended, bowel sounds present, no guarding  Extremities: Right AKA; LLE no swelling   Data Reviewed: Basic Metabolic Panel:  Recent Labs Lab 08/16/15 1721 08/17/15 0837 08/18/15 0453  NA 139 143 145  K 3.1* 2.8* 2.9*  CL 106 106 110  CO2 15* 18* 17*  GLUCOSE 195* 175* 142*  BUN 168* 195* 188*  CREATININE 7.52* 7.20* 6.53*  CALCIUM 8.8* 8.5* 8.6*  PHOS  --   --  5.4*   Liver Function Tests:  Recent Labs Lab 08/16/15 1721 08/17/15 0837 08/18/15 0453  AST 32 21  --   ALT 19 16  --   ALKPHOS 91 76  --   BILITOT 0.7 0.7  --   PROT 7.2 6.5  --   ALBUMIN  3.0* 2.5* 2.8*   CBC:  Recent Labs Lab 08/16/15 1721 08/17/15 0837 08/18/15 0453  WBC 23.5* 20.8* 17.6*  NEUTROABS 21.7* 19.0*  --   HGB 9.8* 8.9* 9.0*  HCT 29.3* 26.8* 27.3*  MCV 68.8* 69.4* 70.2*  PLT 327 296 304   Cardiac Enzymes:  Recent Labs Lab 08/17/15 0835  CKTOTAL 24*  TROPONINI 0.04*   CBG:  Recent Labs Lab 08/17/15 1201 08/17/15 1635 08/17/15 2143 08/18/15 0731 08/18/15 1204  GLUCAP 152* 121* 124* 149* 159*    Recent Results (from the past 240  hour(s))  MRSA PCR Screening     Status: Abnormal   Collection Time: 08/16/15  9:17 PM  Result Value Ref Range Status   MRSA by PCR POSITIVE (A) NEGATIVE Final     Scheduled Meds: . allopurinol  100 mg Oral QHS  . amLODipine  10 mg Oral Daily  . aspirin EC  81 mg Oral Daily  . cefTRIAXone  IV  1 g Intravenous Q24H  . citalopram  20 mg Oral q morning - 10a  . ezetimibe  10 mg Oral Daily  . famotidine  20 mg Oral Daily  . heparin subcutaneous  5,000 Units Subcutaneous 3 times per day  . hydrALAZINE  100 mg Oral TID  . insulin aspart  0-9 Units Subcutaneous TID WC  . iron polysaccharides  150 mg Oral BID  . isosorbide mononitrate  120 mg Oral Daily  . montelukast  10 mg Oral QHS  . timolol  1 drop Both Eyes BID   Continuous Infusions:

## 2015-08-18 NOTE — Progress Notes (Signed)
PT Cancellation Note  Patient Details Name: [name] MRN: [id number] DOB: [birth year]   Cancelled Treatment:    Reason Eval/Treat Not Completed: Medical issues which prohibited therapy (palliative meeting is in process, will hold PT until goals of care are determined per RN request. )   Philomena Doheny 08/18/2015, 2:16 PM [phone]

## 2015-08-18 NOTE — Care Management Important Message (Signed)
Important Message  Patient Details IM Letter given to Cookie/Case Manager to present to Patient.Important Message  Patient Details  Name: MANHATTAN MCCUEN MRN: [id number] Date of Birth: [birth year]   Medicare Important Message Given:  Yes-second notification given    Camillo Flaming 08/18/2015, 11:45 AM Name: MAILLE HALLIWELL MRN: [id number] Date of Birth: December 12, 1926   Medicare Important Message Given:  Yes-second notification given    Camillo Flaming 08/18/2015, 11:44 AM

## 2015-08-18 NOTE — Consult Note (Signed)
Consultation Note Date: 08/18/2015   Patient Name: [name]  DOB: 25-Jun-1927  MRN: [id number]  Age / Sex: 81 y.o., female   PCP: Gaynelle Arabian, MD Referring Physician: Barton Dubois, MD  Reason for Consultation: Establishing goals of care, Non pain symptom management, Pain control and Psychosocial/spiritual support  Palliative Care Assessment and Plan Summary of Established Goals of Care and Medical Treatment Preferences    Palliative Care Discussion Held Today:    This NP Wadie Lessen reviewed medical records, received report from team, assessed the patient and then meet at the patient's bedside along with family to include daughter Stormy Card, son Cletus Gash (by telephone), grand-daughter Hubert Azure and several other members  to discuss diagnosis prognosis, [place], EOL wishes disposition and options.  A detailed discussion was had today regarding advanced directives.  Concepts specific to code status, artifical feeding and hydration, continued IV antibiotics and rehospitalization was had.  The difference between a aggressive medical intervention path  and a palliative comfort care path for this patient at this time was had.  Values and goals of care important to patient and family were attempted to be elicited.  Concept of Hospice and Palliative Care were discussed  Natural trajectory and expectations at EOL were discussed.  Questions and concerns addressed. Family encouraged to call with questions or concerns.  PMT will continue to support holistically.    Primary Decision Maker: Family as a whole but/ two children  and [name] per family are main decision makers  Goals of Care/Code Status/Advance Care Planning:   Code Status: DNR/DNI-comfort is main focus of care  Artificial feeding/hydration: not now or in the future  Antibiotics: none  Diagnostics:none  Rehospitalization: avoid   Symptom Management:    Pain:  Underlying chronic pain- Fentanyl 12.5 mcg IV every 6  hrs   Dyspnea/Pain: Fentanyl 12.5 mcg IV  every 2 hrs prn   Psycho-social/Spiritual:   Support System: family  Desire for further Chaplaincy support:yes, will write consult  Prognosis: < 2 weeks- Renal failure/ Creatine 6.5 , tolerating sips and bites only, shift to full comfort  Discharge Planning:  Hospice facility, family is expressing interest in [facility], they have had several family members there       Chief Complaint: weakness  History of Present Illness:  81 y.o. female with history of diabetes mellitus on diet, chronic kidney disease stage IV to 5, hypertension, chronic diastolic CHF grade 3 who was recently admitted in July for CHF and patient is on large dose of Lasix was found to have increasing weakness over the last 2 weeks. Patient's daughter states that patient has been hardly eating anything over the last 1 week. Patient has been having some nausea but denies any vomiting or diarrhea.  Patient had gone to her PCP and was found to have elevated creatinine from her baseline and was referred to the ER. Patient has chronic left shoulder pain.   Patient is faced with advanced directive decision and anticipatory care needs.    Primary Diagnoses  Present on Admission:  . Acute on chronic renal failure stage V . COPD (chronic obstructive pulmonary disease) ([place]) . Type 2 diabetes mellitus with vascular disease, CKD stage V . Essential hypertension . UTI (lower urinary tract infection) . Malnutrition of moderate degree . Acute on chronic diastolic heart failure ([place]) . Acute encephalopathy . Leukocytosis . Hypokalemia . Anemia of chronic kidney failure  Palliative Review of Systems:    -unable to illicit due to decreased cognition  I have reviewed the medical record, interviewed the patient and family, and examined the patient. The following aspects are pertinent.  Past Medical History  Diagnosis Date  . DM (diabetes mellitus) type II controlled  peripheral vascular disorder (HCC)     A1C = 5.8  . HTN (hypertension)   . CHF (congestive heart failure) (H[facility])     EF = 74-12%, grade 2 diastolic dysfunction  . CAD (coronary artery disease)   . COPD (chronic obstructive pulmonary disease) ([place])   . Cataracts, bilateral   . Hyperlipidemia   . Peripheral vascular disease ([place]) bilateral    S/P femoral-popliteal bypass surgery  . Glaucoma   . TIA (transient ischemic attack)   . Shortness of breath   . Pneumonia     HX OF PNA  . Depression   . GERD (gastroesophageal reflux disease)   . Arthritis     RA  . Anemia   . CKD (chronic kidney disease)   . DVT (deep venous thrombosis) ([place])   . Cancer ([place])     Rt breast  . Hypoxia 06/04/2015   Social History   Social History  . Marital Status: Widowed    Spouse Name: N/A  . Number of Children: N/A  . Years of Education: N/A   Social History Main Topics  . Smoking status: Former Smoker -- 1.00 packs/day for 40 years    Types: Cigarettes    Quit date: 04/13/1977  . Smokeless tobacco: Never Used  . Alcohol Use: No  . Drug Use: No  . Sexual Activity: No   Other Topics Concern  . None   Social History Narrative   Family History  Problem Relation Age of Onset  . Hypertension Mother   . Peripheral vascular disease Mother   . Diabetes Father   . Kidney disease Sister   . Cancer Brother     lung  . Lung disease Sister   . Lung disease Sister   . Kidney disease Daughter     On dialysis, died in February 12, 2006   Scheduled Meds: . amLODipine  10 mg Oral Daily  . cefTRIAXone (ROCEPHIN)  IV  1 g Intravenous Q24H  . Chlorhexidine Gluconate Cloth  6 each Topical Q0600  . citalopram  20 mg Oral q morning - 10a  . docusate sodium  100 mg Oral BID  . feeding supplement (ENSURE ENLIVE)  237 mL Oral BID BM  . fentaNYL (SUBLIMAZE) injection  12.5 mcg Intravenous Q6H  . hydrALAZINE  100 mg Oral TID  . insulin aspart  0-9 Units Subcutaneous TID WC  . iron polysaccharides  150 mg Oral BID   . isosorbide mononitrate  120 mg Oral Daily  . mupirocin ointment  1 application Nasal BID  . polyethylene glycol  17 g Oral Daily  . timolol  1 drop Both Eyes BID   Continuous Infusions:  PRN Meds:.acetaminophen **OR** acetaminophen, fentaNYL (SUBLIMAZE) injection, ondansetron **OR** ondansetron (ZOFRAN) IV, promethazine, traMADol Medications Prior to Admission:  Prior to Admission medications   Medication Sig Start Date End Date Taking? Authorizing Provider  acetaminophen (TYLENOL) 500 MG tablet Take 500 mg by mouth every 6 (six) hours as needed for mild pain.   Yes Historical Provider, MD  allopurinol (ZYLOPRIM) 100 MG tablet Take 1 tablet (100 mg total) by mouth at bedtime. 05/01/14  Yes Daniel J Angiulli, PA-C  amLODipine (NORVASC) 10 MG tablet Take 1 tablet (10 mg total) by mouth daily. 05/01/14  Yes Daniel J Angiulli, PA-C  aspirin  EC 81 MG tablet Take 81 mg by mouth daily.   Yes Historical Provider, MD  b complex-vitamin c-folic acid (NEPHRO-VITE) 0.8 MG TABS tablet Take 1 tablet by mouth every morning.   Yes Historical Provider, MD  citalopram (CELEXA) 20 MG tablet Take 20 mg by mouth every morning.   Yes Historical Provider, MD  cloNIDine (CATAPRES) 0.1 MG tablet Take 0.1 mg by mouth once as needed (if sbp >180 and dbp >110).    Yes Historical Provider, MD  clotrimazole (LOTRIMIN) 1 % external solution Apply 1 application topically 2 (two) times daily as needed (fungal rash on foot).   Yes Historical Provider, MD  ezetimibe (ZETIA) 10 MG tablet Take 1 tablet (10 mg total) by mouth daily. 05/01/14  Yes Daniel J Angiulli, PA-C  famotidine (PEPCID) 20 MG tablet Take 1 tablet (20 mg total) by mouth daily. 05/01/14  Yes Daniel J Angiulli, PA-C  furosemide (LASIX) 80 MG tablet Take 2 tablets (160 mg total) by mouth 2 (two) times daily. 06/11/15  Yes Maryann Mikhail, DO  Glucosamine 500 MG CAPS Take 1,500 mg by mouth 3 (three) times daily.   Yes Historical Provider, MD  hydrALAZINE (APRESOLINE)  100 MG tablet Take 1 tablet (100 mg total) by mouth 3 (three) times daily. 05/01/14  Yes Daniel J Angiulli, PA-C  iron polysaccharides (FERREX 150) 150 MG capsule Take 150 mg by mouth 2 (two) times daily.   Yes Historical Provider, MD  isosorbide mononitrate (IMDUR) 120 MG 24 hr tablet Take 1 tablet (120 mg total) by mouth daily. 05/01/14  Yes Daniel J Angiulli, PA-C  ketoconazole (NIZORAL) 2 % cream Apply 1 application topically daily as needed for irritation.  06/01/15  Yes Historical Provider, MD  loratadine (CLARITIN) 10 MG tablet Take 1 tablet (10 mg total) by mouth daily. 05/01/14  Yes Daniel J Angiulli, PA-C  montelukast (SINGULAIR) 10 MG tablet Take 1 tablet (10 mg total) by mouth at bedtime. 05/01/14  Yes Daniel J Angiulli, PA-C  polyethylene glycol (MIRALAX / GLYCOLAX) packet Take 17 g by mouth daily as needed for mild constipation.   Yes Historical Provider, MD  promethazine (PHENERGAN) 25 MG tablet Take 25 mg by mouth every 6 (six) hours as needed for nausea.   Yes Historical Provider, MD  sennosides-docusate sodium (SENOKOT-S) 8.6-50 MG tablet Take 1 tablet by mouth daily as needed for constipation.   Yes Historical Provider, MD  Skin Protectants, Misc. (BAZA PROTECT EX) Apply 1 application topically 2 (two) times daily. Apply to buttocks topically 2 times daily   Yes Historical Provider, MD  timolol (BETIMOL) 0.5 % ophthalmic solution Place 1 drop into both eyes 2 (two) times daily.   Yes Historical Provider, MD  traMADol (ULTRAM) 50 MG tablet Take 1 tablet (50 mg total) by mouth every 12 (twelve) hours as needed (1 to 2 tablets for pain not relieved by acetaminophen). 05/01/14  Yes Daniel J Angiulli, PA-C  triamcinolone cream (KENALOG) 0.1 % Apply 1 application topically 2 (two) times daily as needed (eczema).  06/02/15  Yes Historical Provider, MD   Allergies  Allergen Reactions  . Crestor [Rosuvastatin Calcium] Other (See Comments)    On [place]  . Fish Allergy     gout  . Lipitor  [Atorvastatin] Other (See Comments)    On [place]  . Zocor [Simvastatin] Other (See Comments)    On [place]  . Celebrex [Celecoxib] Rash  . Codeine Other (See Comments)    REACTION: unknown  . Iodine  Rash  . Omnipaque [Iohexol] Other (See Comments)    REACTION: unknown   CBC:    Component Value Date/Time   WBC 17.6* 08/18/2015 0453   HGB 9.0* 08/18/2015 0453   HCT 27.3* 08/18/2015 0453   PLT 304 08/18/2015 0453   MCV 70.2* 08/18/2015 0453   NEUTROABS 19.0* 08/17/2015 0837   LYMPHSABS 0.8 08/17/2015 0837   MONOABS 0.8 08/17/2015 0837   EOSABS 0.2 08/17/2015 0837   BASOSABS 0.0 08/17/2015 0837   Comprehensive Metabolic Panel:    Component Value Date/Time   NA 145 08/18/2015 0453   K 2.9* 08/18/2015 0453   CL 110 08/18/2015 0453   CO2 17* 08/18/2015 0453   BUN 188* 08/18/2015 0453   CREATININE 6.53* 08/18/2015 0453   GLUCOSE 142* 08/18/2015 0453   CALCIUM 8.6* 08/18/2015 0453   AST 21 08/17/2015 0837   ALT 16 08/17/2015 0837   ALKPHOS 76 08/17/2015 0837   BILITOT 0.7 08/17/2015 0837   PROT 6.5 08/17/2015 0837   ALBUMIN 2.8* 08/18/2015 0453    Physical Exam:  Vital Signs: BP 142/59 mmHg  Pulse 69  Temp(Src) 98.1 F (36.7 C) (Oral)  Resp 18  Ht 5\' 4"  (1.626 m)  Wt 74 kg (163 lb 2.3 oz)  BMI 27.99 kg/m2  SpO2 100% SpO2: SpO2: 100 % O2 Device: O2 Device: Nasal Cannula O2 Flow Rate: O2 Flow Rate (L/min): 2 L/min Intake/output summary:  Intake/Output Summary (Last 24 hours) at 08/18/15 1609 Last data filed at 08/18/15 1500  Gross per 24 hour  Intake    290 ml  Output    600 ml  Net   -310 ml   LBM: Last BM Date: 08/18/15 Baseline Weight: Weight: 72.8 kg (160 lb 7.9 oz) Most recent weight: Weight: 74 kg (163 lb 2.3 oz)  Exam Findings:    General:ill appearing, lethargic, confused  Cardiovascular: RRR.  Respiratory: decreased in bases.  Abdomen: Soft nontender bowel sounds present.  Skin: No rash.  Musculoskeletal: Right AKA.            Palliative Performance Scale: 20  %                Additional Data Reviewed: Recent Labs     08/17/15  0837  08/18/15  0453  WBC  20.8*  17.6*  HGB  8.9*  9.0*  PLT  296  304  NA  143  145  BUN  195*  188*  CREATININE  7.20*  6.53*     Time In: 1600 Time Out: 1715 Time Total: 75 min  Greater than 50%  of this time was spent counseling and coordinating care related to the above assessment and plan.  Discussed with  Dr  Dyann Kief  Signed by: Wadie Lessen, NP  Knox Royalty, NP  08/18/2015, 4:09 PM  Please contact Palliative Medicine Team phone at [phone] for questions and concerns.   See AMION for contact information

## 2015-08-18 NOTE — Progress Notes (Signed)
Palliative Care meeting with family at [facility].

## 2015-08-18 NOTE — Progress Notes (Signed)
Resting in bed. No complain of pain. Agree with previous nurse's assessment. Will continue to monitor patient.

## 2015-08-19 DIAGNOSIS — N390 Urinary tract infection, site not specified: Secondary | ICD-10-CM

## 2015-08-19 DIAGNOSIS — I5033 Acute on chronic diastolic (congestive) heart failure: Secondary | ICD-10-CM

## 2015-08-19 DIAGNOSIS — E876 Hypokalemia: Secondary | ICD-10-CM

## 2015-08-19 MED ORDER — FENTANYL CITRATE (PF) 100 MCG/2ML IJ SOLN: 12.5000 ug | INTRAMUSCULAR | Status: AC | PRN

## 2015-08-19 MED ORDER — BISACODYL 10 MG RE SUPP: 10.0000 mg | Freq: Every day | RECTAL | Status: AC | PRN

## 2015-08-19 MED ORDER — TRAMADOL HCL 50 MG PO TABS: 50.0000 mg | ORAL_TABLET | Freq: Two times a day (BID) | ORAL | Status: AC | PRN

## 2015-08-19 MED ORDER — FENTANYL CITRATE (PF) 100 MCG/2ML IJ SOLN: 12.5000 ug | Freq: Four times a day (QID) | INTRAMUSCULAR | Status: AC

## 2015-08-19 NOTE — Progress Notes (Signed)
CSW reviewed Palliative note and plans for residential hospice. CSW has contacted patient's family and they would like to pursue [facility]. CSW has made referral to Erling Conte, [facility], who will review and follow up with family for potential transfer.  CSW will update as information rec'd.  Support offered to family- granddaughter, Shirlean Mylar, is coming to visit (from out of town) patient today per [place], MSW, [place]

## 2015-08-19 NOTE — Discharge Summary (Addendum)
Physician Discharge Summary  [name] WNU:[id number] DOB: [birth year] DOA: 08/16/2015  PCP: Simona Huh, MD  Admit date: 08/16/2015 Discharge date: 08/20/2015  Time spent: >30  minutes  Recommendations for Outpatient Follow-up:  1. Full comfort Care  Discharge Diagnoses:  Principal Problem:   Acute on chronic renal failure stage V Active Problems:   S/P AKA (above knee amputation) ([place])   Type 2 diabetes mellitus with vascular disease, CKD stage V   COPD (chronic obstructive pulmonary disease) (HCC)   Essential hypertension   UTI (lower urinary tract infection)   Malnutrition of moderate degree   Acute on chronic diastolic heart failure (HCC)   Acute encephalopathy   Leukocytosis   Hypokalemia   Anemia of chronic kidney failure   Palliative care encounter   DNR (do not resuscitate) discussion   Pain, generalized   Discharge Condition: overall comfortable, vital signs stable. Will discharge to residential hospice facility for further symptomatic management and end of life care  Diet recommendation: comfort feeding  Filed Weights   08/16/15 2050 08/17/15 0500 08/18/15 0439  Weight: 72.8 kg (160 lb 7.9 oz) 73.6 kg (162 lb 4.1 oz) 74 kg (163 lb 2.3 oz)    History of present illness:  81 y.o. female with history of diabetes mellitus, chronic kidney disease stage 4 - 5, hypertension, chronic diastolic CHF grade 3, who was recently admitted in July for decompensated CHF and presented for progressively worsening weakness, poor oral intake, nausea but no vomiting.  In ED, pt noted to be hemodynamically stable, Blood work notable for WBC 23.5 K, K 3.1, Cr 7.52 with CO2 = 15.   Hospital Course:  Acute on chronic renal failure, stage V - IVF have been provided as the acute rise in Cr was determined to be secondary to pre renal etiology, poor oral intake - patient developed bibasilar crackles and BUN continue to be very elevated -family and patient do not want to pursuit  further invasive treatment and in fact pt already made it clear before she did not want HD and has firmly kept that position  - d/w nephrologist and agreeable to pursuit comfort care - palliative care consulted and per results on [place] meeting plan is for comfort care and residential hospice -Patient will be discharge to [facility] for symptomatic management and end of life care   Acute encephalopathy - in the setting of progressive failure to thrive and dehydration, progressive renal failure and uremia - still somnolent this AM but easy to awake -plan is for comfort care -patient has firmly refused HD   Leukocytosis - UA suggestive of UTI and pt was already started on ABX Rocephin on admission -received tx for 2 days; but plan is for no further antibiotics and focus on comfort care   Anemia of CKD, stage V - no signs of active bleeding - plan is for full comfort care and per discussion with family and assistance from palliative care will pursuit residential hospice placement    Moderate malnutrition - comfort feeding anticipated  -patient is a full comfort now   Hypokalemia - supplemented -no further blood work will be pursuit at this moment  -plan is for full comfort care   Metabolic acidosis  - in the setting of acute on chronic renal failure  - improving with IVF but developing of vascular congestion limits use of additional IVF - encourage PO intake if pt able to tolerate  -patient has declined HD and after [place] meeting decision has been taken  for full comfort care and discharge to hospice home when bed available    S/P AKA (above knee amputation), right  - stable at this time   Type 2 diabetes mellitus with vascular disease ([place]), CKD stage V - no more insulin or treatment for diabetes will be pursuit -plan of care is full comfort care   COPD (chronic obstructive pulmonary disease) (HCC) - no wheezing on exam this AM  - allow BD's as needed     Acute on chronic diastolic CHF - last 2 D ECHO 06/09/15 with grade II diastolic CHF and EF 67% - patient w/o JVD and no frank crackles on exam this AM - IVF has now been off for 36 hours - weight stable at 74kg this AM - monitor daily weights, strict I/O Plan is for comfort measures -use of fentanyl for SOB and discomfort will be used    Essential hypertension - reasonable inpatient control  - continue Imdur and Norvasc  Procedures:  Renal US: no hydronephrosis. Increased cortical echogenicity compatible with medical renal parenchymal disease.   Consultations:  Palliative Care  Renal service (Curbside)  Discharge Exam: Filed Vitals:   08/19/15 0440  BP: 135/67  Pulse: 75  Temp: 98 F (36.7 C)  Resp: 18    General: Pt is somnolent but easy to arouse, NAD and overall comfortable with stable VS   Cardiovascular: Regular rate and rhythm, no rubs, no gallops, no appreciated JVD  Respiratory: Clear to auscultation bilaterally, no frank crackles appreciateds   Abdomen: Soft, non tender, non distended, bowel sounds present, no guarding  Extremities: Right AKA; LLE no swelling  Discharge Instructions   Discharge Instructions    Discharge instructions    Complete by:  As directed   Full comfort care and symptomatic management Comfort feeding          Current Discharge Medication List    START taking these medications   Details  bisacodyl (DULCOLAX) 10 MG suppository Place 1 suppository (10 mg total) rectally daily as needed for mild constipation. Qty: 12 suppository, Refills: 0    !! fentaNYL (SUBLIMAZE) 100 MCG/2ML injection Inject 0.25 mLs (12.5 mcg total) into the vein every 6 (six) hours. Qty: 2 mL, Refills: 0    !! fentaNYL (SUBLIMAZE) 100 MCG/2ML injection Inject 0.25 mLs (12.5 mcg total) into the vein every 2 (two) hours as needed for moderate pain (dyspnea). Qty: 2 mL, Refills: 0     !! - Potential duplicate medications found. Please discuss  with provider.    CONTINUE these medications which have CHANGED   Details  traMADol (ULTRAM) 50 MG tablet Take 1 tablet (50 mg total) by mouth every 12 (twelve) hours as needed (1 to 2 tablets for pain not relieved by acetaminophen). Qty: 60 tablet, Refills: 0      CONTINUE these medications which have NOT CHANGED   Details  acetaminophen (TYLENOL) 500 MG tablet Take 500 mg by mouth every 6 (six) hours as needed for mild pain.    amLODipine (NORVASC) 10 MG tablet Take 1 tablet (10 mg total) by mouth daily. Qty: 30 tablet, Refills: 1    isosorbide mononitrate (IMDUR) 120 MG 24 hr tablet Take 1 tablet (120 mg total) by mouth daily. Qty: 30 tablet, Refills: 1    loratadine (CLARITIN) 10 MG tablet Take 1 tablet (10 mg total) by mouth daily. Qty: 30 tablet, Refills: 1    montelukast (SINGULAIR) 10 MG tablet Take 1 tablet (10 mg total) by mouth at bedtime. Qty:  30 tablet, Refills: 1    polyethylene glycol (MIRALAX / GLYCOLAX) packet Take 17 g by mouth daily as needed for mild constipation.    promethazine (PHENERGAN) 25 MG tablet Take 25 mg by mouth every 6 (six) hours as needed for nausea.    Skin Protectants, Misc. (BAZA PROTECT EX) Apply 1 application topically 2 (two) times daily. Apply to buttocks topically 2 times daily    timolol (BETIMOL) 0.5 % ophthalmic solution Place 1 drop into both eyes 2 (two) times daily.      STOP taking these medications     allopurinol (ZYLOPRIM) 100 MG tablet      aspirin EC 81 MG tablet      b complex-vitamin c-folic acid (NEPHRO-VITE) 0.8 MG TABS tablet      citalopram (CELEXA) 20 MG tablet      cloNIDine (CATAPRES) 0.1 MG tablet      clotrimazole (LOTRIMIN) 1 % external solution      ezetimibe (ZETIA) 10 MG tablet      famotidine (PEPCID) 20 MG tablet      furosemide (LASIX) 80 MG tablet      Glucosamine 500 MG CAPS      hydrALAZINE (APRESOLINE) 100 MG tablet      iron polysaccharides (FERREX 150) 150 MG capsule       ketoconazole (NIZORAL) 2 % cream      sennosides-docusate sodium (SENOKOT-S) 8.6-50 MG tablet      triamcinolone cream (KENALOG) 0.1 %        Allergies  Allergen Reactions  . Crestor [Rosuvastatin Calcium] Other (See Comments)    On [place]  . Fish Allergy     gout  . Lipitor [Atorvastatin] Other (See Comments)    On [place]  . Zocor [Simvastatin] Other (See Comments)    On [place]  . Celebrex [Celecoxib] Rash  . Codeine Other (See Comments)    REACTION: unknown  . Iodine Rash  . Omnipaque [Iohexol] Other (See Comments)    REACTION: unknown     The results of significant diagnostics from this hospitalization (including imaging, microbiology, ancillary and laboratory) are listed below for reference.    Significant Diagnostic Studies: Dg Chest 1 View  08/16/2015   CLINICAL DATA:  Nursing home patient with worsening weakness and shortness of breath over the last 3 days. Personal history of hypertension, diabetes, and CABG.  EXAM: CHEST 1 VIEW  COMPARISON:  06/24/2015  FINDINGS: There has been previous median sternotomy and CABG. Heart size is at the upper limits of normal. There is atherosclerosis of the aorta. There is pulmonary venous hypertension, possibly with early interstitial edema. No visible effusion. No focal infiltrate or collapse. Chronic degenerative changes affect the shoulders.  IMPRESSION: Probable low level congestive heart failure with venous hypertension and early interstitial edema.   Electronically Signed   By: Nelson Chimes M.D.   On: 08/16/2015 17:54   US Renal  08/16/2015   CLINICAL DATA:  Renal failure  EXAM: RENAL / URINARY TRACT ULTRASOUND COMPLETE  COMPARISON:  06/25/2012  FINDINGS: Right Kidney:  Length: 10.0 cm. No mass or hydronephrosis. Moderate cortical thinning. Cortex is echogenic.  Left Kidney:  Length: 10.4 cm. No solid mass. Upper pole benign appearing cyst measures 3.2 x 2.5 x 2.57. Increased cortical echogenicity. Moderate  cortical thinning.  Bladder:  Appears normal for degree of bladder distention.  IMPRESSION: No hydronephrosis.  Increased cortical echogenicity compatible with medical renal parenchymal disease.   Electronically Signed   By: Arnell Sieving  Hoss M.D.   On: 08/16/2015 20:10   Dg Shoulder Left  08/16/2015   CLINICAL DATA:  Generalized chronic left shoulder pain with decreased mobility.  EXAM: LEFT SHOULDER - 2+ VIEW  COMPARISON:  06/24/2015 chest x-ray  FINDINGS: Severe advanced degenerative joint disease of the [facility] joint and glenohumeral joint. These areas demonstrate joint space loss, sclerosis and osteophyte formation. No acute osseous finding or fracture. No gross malalignment. Patient was unable to perform an axillary view.  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  No definite acute osseous finding by plain radiography   Electronically Signed   By: Jerilynn Mages.  Shick M.D.   On: 08/16/2015 18:00    Microbiology: Recent Results (from the past 240 hour(s))  MRSA PCR Screening     Status: Abnormal   Collection Time: 08/16/15  9:17 PM  Result Value Ref Range Status   MRSA by PCR POSITIVE (A) NEGATIVE Final    Comment:        The GeneXpert MRSA Assay (FDA approved for NASAL specimens only), is one component of a comprehensive MRSA colonization surveillance program. It is not intended to diagnose MRSA infection nor to guide or monitor treatment for MRSA infections. RESULT CALLED TO, READ BACK BY AND VERIFIED WITH: SOFIA PICKETT RN 10.3.16 @ 2311 BY RICEJ      Labs: Basic Metabolic Panel:  Recent Labs Lab 08/16/15 1721 08/17/15 0837 08/18/15 0453  NA 139 143 145  K 3.1* 2.8* 2.9*  CL 106 106 110  CO2 15* 18* 17*  GLUCOSE 195* 175* 142*  BUN 168* 195* 188*  CREATININE 7.52* 7.20* 6.53*  CALCIUM 8.8* 8.5* 8.6*  PHOS  --   --  5.4*   Liver Function Tests:  Recent Labs Lab 08/16/15 1721 08/17/15 0837 08/18/15 0453  AST 32 21  --   ALT 19 16  --   ALKPHOS 91 76   --   BILITOT 0.7 0.7  --   PROT 7.2 6.5  --   ALBUMIN 3.0* 2.5* 2.8*   CBC:  Recent Labs Lab 08/16/15 1721 08/17/15 0837 08/18/15 0453  WBC 23.5* 20.8* 17.6*  NEUTROABS 21.7* 19.0*  --   HGB 9.8* 8.9* 9.0*  HCT 29.3* 26.8* 27.3*  MCV 68.8* 69.4* 70.2*  PLT 327 296 304   Cardiac Enzymes:  Recent Labs Lab 08/17/15 0835  CKTOTAL 24*  TROPONINI 0.04*   BNP: BNP (last 3 results)  Recent Labs  06/04/15 0911  BNP 308.6*   CBG:  Recent Labs Lab 08/17/15 1201 08/17/15 1635 08/17/15 2143 08/18/15 0731 08/18/15 1204  GLUCAP 152* 121* 124* 149* 159*    Signed:  Barton Dubois  [facility] 08/19/2015, 11:41 AM

## 2015-08-20 DIAGNOSIS — Z515 Encounter for palliative care: Secondary | ICD-10-CM

## 2015-08-20 NOTE — Consult Note (Signed)
[place] Liaison:  Received request from [place] for family interest in [facility]. Chart reviewed and appreciate report from Palliative Medicine Team NP Wadie Lessen. Met with G-dtr [name] yesterday afternoon at her place of work to complete paper work for patient to transfer to [employer] today. Dr. Orpah Melter to assume care for patient per [facility] request.   Please fax discharge summary to [phone].  RN please call report to [phone].  Please arrange transport for patient to arrive before noon if possible.   Thank you. Erling Conte, [place]

## 2015-08-20 NOTE — Progress Notes (Signed)
Patient for d/c today to [facility]. Family at bedside and agreeable to plans. Plan transfer via EMS. Eduard Clos, MSW, [place]

## 2015-08-20 NOTE — Progress Notes (Signed)
Patient seen and examined. VSS, no acute distress and currently no complaining of CP or SOB. Appetite continue to be poor. Plan for inpatient hospice remains appropriate and patient is medically stable for discharge.  Barton Dubois [phone]

## 2015-09-14 DEATH — deceased

## 2015-09-23 ENCOUNTER — Encounter: Payer: Self-pay | Admitting: Surgery

## 2015-09-27 ENCOUNTER — Ambulatory Visit: Payer: Medicare Other | Admitting: Surgery

## 2015-09-27 ENCOUNTER — Encounter (HOSPITAL_COMMUNITY): Payer: Medicare Other

## 2015-09-27 ENCOUNTER — Other Ambulatory Visit (HOSPITAL_COMMUNITY): Payer: Medicare Other
